# Patient Record
Sex: Female | Born: 1940 | Race: White | Hispanic: No | Marital: Married | Smoking: Never smoker
Health system: Southern US, Community
[De-identification: ages and names within clinical notes are randomized; demographics above are authoritative.]

## PROBLEM LIST (undated history)

## (undated) DIAGNOSIS — S83209A Unspecified tear of unspecified meniscus, current injury, unspecified knee, initial encounter: Secondary | ICD-10-CM

## (undated) DIAGNOSIS — M7500 Adhesive capsulitis of unspecified shoulder: Secondary | ICD-10-CM

## (undated) DIAGNOSIS — M5136 Other intervertebral disc degeneration, lumbar region: Secondary | ICD-10-CM

## (undated) DIAGNOSIS — I7090 Unspecified atherosclerosis: Secondary | ICD-10-CM

## (undated) DIAGNOSIS — M51369 Other intervertebral disc degeneration, lumbar region without mention of lumbar back pain or lower extremity pain: Secondary | ICD-10-CM

## (undated) DIAGNOSIS — M329 Systemic lupus erythematosus, unspecified: Secondary | ICD-10-CM

## (undated) DIAGNOSIS — M47812 Spondylosis without myelopathy or radiculopathy, cervical region: Secondary | ICD-10-CM

## (undated) DIAGNOSIS — R937 Abnormal findings on diagnostic imaging of other parts of musculoskeletal system: Secondary | ICD-10-CM

## (undated) DIAGNOSIS — IMO0002 Reserved for concepts with insufficient information to code with codable children: Secondary | ICD-10-CM

## (undated) DIAGNOSIS — K589 Irritable bowel syndrome without diarrhea: Secondary | ICD-10-CM

## (undated) DIAGNOSIS — M5090 Cervical disc disorder, unspecified, unspecified cervical region: Secondary | ICD-10-CM

## (undated) DIAGNOSIS — G0481 Other encephalitis and encephalomyelitis: Secondary | ICD-10-CM

## (undated) DIAGNOSIS — G259 Extrapyramidal and movement disorder, unspecified: Secondary | ICD-10-CM

## (undated) DIAGNOSIS — M1990 Unspecified osteoarthritis, unspecified site: Secondary | ICD-10-CM

## (undated) DIAGNOSIS — Z9889 Other specified postprocedural states: Secondary | ICD-10-CM

## (undated) DIAGNOSIS — I10 Essential (primary) hypertension: Secondary | ICD-10-CM

## (undated) DIAGNOSIS — M7710 Lateral epicondylitis, unspecified elbow: Secondary | ICD-10-CM

## (undated) DIAGNOSIS — L309 Dermatitis, unspecified: Secondary | ICD-10-CM

## (undated) DIAGNOSIS — R930 Abnormal findings on diagnostic imaging of skull and head, not elsewhere classified: Secondary | ICD-10-CM

## (undated) DIAGNOSIS — D8989 Other specified disorders involving the immune mechanism, not elsewhere classified: Secondary | ICD-10-CM

## (undated) DIAGNOSIS — E785 Hyperlipidemia, unspecified: Secondary | ICD-10-CM

## (undated) DIAGNOSIS — M47816 Spondylosis without myelopathy or radiculopathy, lumbar region: Secondary | ICD-10-CM

## (undated) DIAGNOSIS — I4891 Unspecified atrial fibrillation: Secondary | ICD-10-CM

## (undated) DIAGNOSIS — R112 Nausea with vomiting, unspecified: Secondary | ICD-10-CM

## (undated) DIAGNOSIS — K529 Noninfective gastroenteritis and colitis, unspecified: Secondary | ICD-10-CM

## (undated) HISTORY — DX: Hyperlipidemia, unspecified: E78.5

## (undated) HISTORY — DX: Spondylosis without myelopathy or radiculopathy, cervical region: M47.812

## (undated) HISTORY — DX: Other specified disorders involving the immune mechanism, not elsewhere classified: D89.89

## (undated) HISTORY — DX: Adhesive capsulitis of unspecified shoulder: M75.00

## (undated) HISTORY — DX: Other intervertebral disc degeneration, lumbar region: M51.36

## (undated) HISTORY — DX: Reserved for concepts with insufficient information to code with codable children: IMO0002

## (undated) HISTORY — DX: Unspecified atrial fibrillation: I48.91

## (undated) HISTORY — DX: Noninfective gastroenteritis and colitis, unspecified: K52.9

## (undated) HISTORY — DX: Unspecified tear of unspecified meniscus, current injury, unspecified knee, initial encounter: S83.209A

## (undated) HISTORY — DX: Cervical disc disorder, unspecified, unspecified cervical region: M50.90

## (undated) HISTORY — DX: Extrapyramidal and movement disorder, unspecified: G25.9

## (undated) HISTORY — PX: CATARACT EXTRACTION: SUR2

## (undated) HISTORY — DX: Unspecified osteoarthritis, unspecified site: M19.90

## (undated) HISTORY — DX: Essential (primary) hypertension: I10

## (undated) HISTORY — DX: Systemic lupus erythematosus, unspecified: M32.9

## (undated) HISTORY — DX: Lateral epicondylitis, unspecified elbow: M77.10

## (undated) HISTORY — PX: COLONOSCOPY: SHX174

## (undated) HISTORY — PX: HEMORRHOID BANDING: SHX5850

## (undated) HISTORY — DX: Unspecified atherosclerosis: I70.90

## (undated) HISTORY — DX: Dermatitis, unspecified: L30.9

## (undated) HISTORY — DX: Other intervertebral disc degeneration, lumbar region without mention of lumbar back pain or lower extremity pain: M51.369

## (undated) HISTORY — DX: Irritable bowel syndrome, unspecified: K58.9

## (undated) HISTORY — PX: EYE SURGERY: SHX253

## (undated) HISTORY — DX: Spondylosis without myelopathy or radiculopathy, lumbar region: M47.816

## (undated) HISTORY — PX: WISDOM TOOTH EXTRACTION: SHX21

---

## 1978-04-23 HISTORY — PX: TUBAL LIGATION: SHX77

## 1996-10-22 ENCOUNTER — Encounter: Payer: Self-pay | Admitting: Gastroenterology

## 2002-05-01 ENCOUNTER — Encounter: Payer: Self-pay | Admitting: Gastroenterology

## 2002-05-01 ENCOUNTER — Ambulatory Visit (HOSPITAL_COMMUNITY): Admission: RE | Admit: 2002-05-01 | Discharge: 2002-05-01 | Payer: Self-pay | Admitting: Gastroenterology

## 2008-07-06 ENCOUNTER — Other Ambulatory Visit: Admission: RE | Admit: 2008-07-06 | Discharge: 2008-07-06 | Payer: Self-pay | Admitting: Family Medicine

## 2010-09-08 NOTE — Op Note (Signed)
   [name], Lindsay Clark                           ACCOUNT NO.:  [id number]   MEDICAL RECORD NO.:  [id number]                   PATIENT TYPE:  AMB   LOCATION:  ENDO                                 FACILITY:  [facility]   PHYSICIAN:  Danise Edge, M.D.                DATE OF BIRTH:  1940/12/02   DATE OF PROCEDURE:  05/01/2002  DATE OF DISCHARGE:                                 OPERATIVE REPORT   PROCEDURE:  Screening colonoscopy.   INDICATIONS:  The patient is a 70 year old female born 1940/11/09. The  patient's mother was diagnosed with colon cancer at age 75. The patient is  undergoing a screening colonoscopy with polypectomy to prevent colon cancer.   ENDOSCOPIST:  Danise Edge, M.D.   PREMEDICATION:  1. Versed 5 mg.  2. Demerol 50 mg.   ENDOSCOPE:  Olympus pediatric colonoscope.   DESCRIPTION OF PROCEDURE:  After informed consent was obtained the patient  was placed in the left lateral decubitus position. I administered  intravenous Demerol and intravenous Versed to achieve conscious sedation for  the procedure. The patient's blood pressure, O2 saturation and cardiac  rhythm were monitored throughout the procedure and documented in the medical  record.   Anal inspection was normal. Digital rectal exam was normal. The Olympus  pediatric video colonoscope was introduced into the rectum and easily  advanced to the cecum. Colonic preparation for the examination today was  excellent.   Rectum:  Normal.  Sigmoid colon and descending colon:  Normal.  Splenic flexure:  Normal.  Transverse colon:  Normal.  Hepatic flexure:  Normal.  Ascending colon:  Normal.  Cecum and ileocecal valve:  Normal.    ASSESSMENT:  Normal screening proctocolonoscopy to the cecum. No endoscopic  evidence for the presence of colorectal neoplasia.   RECOMMENDATIONS:  Repeat colonoscopy in approximately 5 years.                                               Danise Edge, M.D.    MJ/MEDQ  D:   05/01/2002  T:  05/02/2002  Job:  [id number]   cc:   Lilla Shook, M.D.  [address], [address]  [place]  [place]  [address]  Fax: [phone]

## 2011-07-16 ENCOUNTER — Other Ambulatory Visit (HOSPITAL_COMMUNITY)
Admission: RE | Admit: 2011-07-16 | Discharge: 2011-07-16 | Disposition: A | Discharge status: Home / Self Care | Payer: Medicare Other | Source: Ambulatory Visit | Attending: Family Medicine | Admitting: Family Medicine

## 2011-07-16 DIAGNOSIS — Z124 Encounter for screening for malignant neoplasm of cervix: Secondary | ICD-10-CM | POA: Insufficient documentation

## 2011-11-30 DIAGNOSIS — L280 Lichen simplex chronicus: Secondary | ICD-10-CM | POA: Insufficient documentation

## 2011-11-30 DIAGNOSIS — L299 Pruritus, unspecified: Secondary | ICD-10-CM | POA: Insufficient documentation

## 2014-01-21 ENCOUNTER — Other Ambulatory Visit: Payer: Self-pay | Admitting: Family Medicine

## 2014-01-21 ENCOUNTER — Ambulatory Visit
Admission: RE | Admit: 2014-01-21 | Discharge: 2014-01-21 | Disposition: A | Discharge status: Home / Self Care | Payer: Medicare HMO | Source: Ambulatory Visit | Attending: Family Medicine | Admitting: Family Medicine

## 2014-01-21 DIAGNOSIS — R059 Cough, unspecified: Secondary | ICD-10-CM

## 2014-01-21 DIAGNOSIS — R05 Cough: Secondary | ICD-10-CM

## 2014-04-01 ENCOUNTER — Other Ambulatory Visit: Payer: Self-pay | Admitting: Family Medicine

## 2014-04-01 DIAGNOSIS — R4781 Slurred speech: Secondary | ICD-10-CM

## 2014-04-01 DIAGNOSIS — R29898 Other symptoms and signs involving the musculoskeletal system: Secondary | ICD-10-CM

## 2014-04-05 ENCOUNTER — Other Ambulatory Visit: Payer: Medicare HMO

## 2014-04-05 ENCOUNTER — Ambulatory Visit
Admission: RE | Admit: 2014-04-05 | Discharge: 2014-04-05 | Disposition: A | Discharge status: Home / Self Care | Payer: Medicare HMO | Source: Ambulatory Visit | Attending: Family Medicine | Admitting: Family Medicine

## 2014-04-05 DIAGNOSIS — R4781 Slurred speech: Secondary | ICD-10-CM

## 2014-04-05 DIAGNOSIS — R29898 Other symptoms and signs involving the musculoskeletal system: Secondary | ICD-10-CM

## 2014-04-05 MED ORDER — GADOBENATE DIMEGLUMINE 529 MG/ML IV SOLN
18.0000 mL | Freq: Once | INTRAVENOUS | Status: AC | PRN
  Administered 2014-04-05: 18 mL via INTRAVENOUS

## 2014-04-06 ENCOUNTER — Other Ambulatory Visit: Payer: Self-pay | Admitting: Family Medicine

## 2014-04-06 DIAGNOSIS — I63412 Cerebral infarction due to embolism of left middle cerebral artery: Secondary | ICD-10-CM

## 2014-04-08 ENCOUNTER — Ambulatory Visit: Payer: Medicare HMO | Admitting: Occupational Therapy

## 2014-04-08 ENCOUNTER — Ambulatory Visit: Payer: Medicare HMO | Attending: Family Medicine | Admitting: Physical Therapy

## 2014-04-08 ENCOUNTER — Encounter: Payer: Self-pay | Admitting: Occupational Therapy

## 2014-04-08 ENCOUNTER — Encounter: Payer: Self-pay | Admitting: Physical Therapy

## 2014-04-08 DIAGNOSIS — R269 Unspecified abnormalities of gait and mobility: Secondary | ICD-10-CM

## 2014-04-08 DIAGNOSIS — I69359 Hemiplegia and hemiparesis following cerebral infarction affecting unspecified side: Secondary | ICD-10-CM | POA: Diagnosis present

## 2014-04-08 DIAGNOSIS — I63512 Cerebral infarction due to unspecified occlusion or stenosis of left middle cerebral artery: Secondary | ICD-10-CM

## 2014-04-08 DIAGNOSIS — I639 Cerebral infarction, unspecified: Secondary | ICD-10-CM

## 2014-04-08 DIAGNOSIS — R531 Weakness: Secondary | ICD-10-CM

## 2014-04-08 NOTE — Therapy (Signed)
[facility] [address] [place], [place], [address] Phone: [phone]   Fax:  [phone]  Occupational Therapy Evaluation  Patient Details  Name: Lindsay Clark MRN: [id number] Date of Birth: [birth year]  Encounter Date: 04/08/2014      OT End of Session - 04/08/14 1711    Visit Number 1   Number of Visits 1   OT Start Time 1400   OT Stop Time 1440   OT Time Calculation (min) 40 min   Activity Tolerance Patient tolerated treatment well      History reviewed. No pertinent past medical history.  History reviewed. No pertinent past surgical history.  There were no vitals taken for this visit.  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 No/denies          [facility] OT Assessment - 04/08/14 0001    Assessment   Diagnosis L MCA CVA   Onset Date 02/14/14   Prior Therapy PT for back    Precautions   Precautions None   Restrictions   Weight Bearing Restrictions No   Balance Screen   Has the patient fallen in the past 6 months No   Home  Environment   Family/patient expects to be discharged to: Private residence   Home Layout One level   [place]  built in Civil engineer, contracting   Lives With Spouse   Prior Function   Level of Independence Independent with basic ADLs;Independent with homemaking with ambulation   ADL   Eating/Feeding Independent   Grooming Independent   Upper Body Bathing Independent   Lower Body Bathing Modified independent  occasionally sits to get between her toes   Upper Body Dressing Independent   Lower Body Dressing Modified independent  pt states  she has trouble/numbness in 2 toes/increased time.   Research officer, political party   Where Assessed - Arts administrator Modified independent  grab bar and occassionally sits   IADL   Shopping Takes care of all shopping needs independently   [place] house alone or with occasional assistance  Needs assist with heavy fall cleaning   Meal Prep Plans, prepares and serves adequate meals independently   [place] own vehicle   Medication Management Is responsible for taking medication in correct dosages at correct time   Physiological scientist financial matters independently (budgets, writes checks, pays rent, bills goes to bank), collects and keeps track of income   Mobility   Mobility Status Independent   Mobility Status Comments no falls and no use of any equipment   Written Expression   Dominant Hand Right   Activity Tolerance   Activity Tolerance Comments pt functions independent and is active in community however states she feels her energy level is not the same.   Cognition   Overall Cognitive Status Within Functional Limits for tasks assessed  pt feels she is not as organized; could not give Haematologist  no obvious deficits in eval/pt unable to give example   Observation/Other Assessments   Outcome Measures Hand Function 80%  Sensation   Light Touch Appears Intact   Hot/Cold Appears Intact  per pt report with shower water   Proprioception Appears Intact   Coordination   Gross Motor Movements are Fluid and Coordinated Yes   Fine Motor Movements are Fluid and Coordinated Yes   Coordination 9 hole peg R=20.9 and L = 22.9   AROM   Overall AROM  Within functional limits for tasks performed   Strength   Overall Strength Within functional limits for tasks performed   Hand Function   Comments R grasp = 45 L grasp = 45                         OT  Short Term Goals - 04/08/14 1716    OT SHORT TERM GOAL #1   Title N/A           OT Long Term Goals - 04/08/14 1716    OT LONG TERM GOAL #1   Title N/A               Plan - 04/08/14 1711    Clinical Impression Statement Pt is a 73 year old female s/p L CVA on 02/14/2014 who recently saw her MD with numerous complaints of related to completing functional tasks as well as numbness in her RUE and RLE.  Upon evaluation, pt tested within normal limits for all areas on OT eval and no specific impairments related to OT scope of practice.  Pt does report some difficulty with LB dressing and IADL's but these are related to LE numbnes, debility and balance.  Pt to be seen by PT today for eval. Please see PT eval.   Clinical Impairments Affecting Rehab Potential Pt does not require skilled OT services as this time.   OT Frequency --  N/A   OT Duration --  N/A   Plan Pt does not require skilled OT services at this time. Pt aware and in agreement.   Consulted and Agree with Plan of Care Patient        Problem List There are no active problems to display for this patient.  Forde Radon, MS, OTR/L 04/08/2014 5:20 PM Phone: [phone] Fax: [phone]   Lindsay Clark 04/08/2014, 5:20 PM  [facility] [address] [place], [place], [address] Phone: [phone]   Fax:  [phone]

## 2014-04-08 NOTE — Therapy (Signed)
[facility] [address] [place], [place], [address] Phone: [phone]   Fax:  [phone]  Physical Therapy Evaluation  Patient Details  Name: Lindsay Clark MRN: [id number] Date of Birth: 02/25/1941  Encounter Date: 04/08/2014      PT End of Session - 04/08/14 1445    Visit Number 1   Number of Visits 17   Date for PT Re-Evaluation 06/04/13   PT Start Time 1497   PT Stop Time 1530   PT Time Calculation (min) 45 min   Equipment Utilized During Treatment Gait belt   Activity Tolerance Patient tolerated treatment well   Behavior During Therapy [facility] for tasks assessed/performed      History reviewed. No pertinent past medical history.  History reviewed. No pertinent past surgical history.  There were no vitals taken for this visit.  Visit Diagnosis:  CVA (cerebral vascular accident) - Plan: PT plan of care cert/re-cert  Abnormality of gait - Plan: PT plan of care cert/re-cert  Weakness generalized - Plan: PT plan of care cert/re-cert      Subjective Assessment - 04/08/14 1449    Symptoms This 73yo female was diagnosed with CVA in Dec with imaging indicating infarct in Oct. She was not hospitalized.   Currently in Pain? No/denies          [facility] PT Assessment - 04/08/14 1445    Assessment   Medical Diagnosis CVA   Precautions   Precautions Fall   Restrictions   Weight Bearing Restrictions No   Balance Screen   Has the patient fallen in the past 6 months No   Has the patient had a decrease in activity level because of a fear of falling?  No   Is the patient reluctant to leave their home because of a fear of falling?  No   Home Environment   Living Enviornment Private residence   Living Arrangements Spouse/significant other   Type of [place] to enter   Entrance Stairs-Number of Steps 2   [place] One level   Prior Function   Level of  Independence Independent with basic ADLs;Independent with homemaking with ambulation;Independent with gait;Independent with transfers   Observation/Other Assessments   Focus on Therapeutic Outcomes (FOTO)  53  Functional Status   Stroke Impact Scale  83.3%   Fear Avoidance Belief Questionnaire (FABQ)  19   Strength   Overall Strength Deficits  right hip extension & abduction 4/5, gastroc 2/5   Ambulation/Gait   Ambulation/Gait Yes   Ambulation/Gait Assistance 5: Supervision   Ambulation Distance (Feet) 300 Feet   Assistive device None   Gait Pattern Decreased stride length;Decreased stance time - left;Poor foot clearance - right  initial contact with lateral foot & foot flat   Gait velocity 4.39 ft/sec   Stairs Yes   Stairs Assistance 5: Supervision   Stair Management Technique No rails;Alternating pattern;Forwards   Number of Stairs 4   Berg Balance Test   Sit to Stand Able to stand without using hands and stabilize independently   Standing Unsupported Able to stand safely 2 minutes   Sitting with Back Unsupported but Feet Supported on Floor or Stool Able to sit safely and securely 2 minutes   Stand to Sit Sits safely with minimal use of hands   Transfers Able to transfer safely, minor use of hands   Standing Unsupported with Eyes Closed Able to stand 10 seconds safely  Standing Ubsupported with Feet Together Able to place feet together independently and stand 1 minute safely   From Standing, Reach Forward with Outstretched Arm Can reach confidently >25 cm (10")   From Standing Position, Pick up Object from [place] to pick up shoe safely and easily   From Standing Position, Turn to Look Behind Over each Shoulder Looks behind from both sides and weight shifts well   Turn 360 Degrees Able to turn 360 degrees safely one side only in 4 seconds or less   Standing Unsupported, Alternately Place Feet on Step/Stool Able to stand independently and safely and complete 8 steps in 20  seconds   Standing Unsupported, One Foot in Front Able to take small step independently and hold 30 seconds   Standing on One Leg Tries to lift leg/unable to hold 3 seconds but remains standing independently   Total Score 50   Dynamic Gait Index   Level Surface Mild Impairment   Change in Gait Speed Mild Impairment   Gait with Horizontal Head Turns Mild Impairment   Gait with Vertical Head Turns Mild Impairment   Gait and Pivot Turn Mild Impairment   Step Over Obstacle Moderate Impairment   Step Around Obstacles Mild Impairment   Steps Mild Impairment   Total Score 15   Timed Up and Go Test   Normal TUG (seconds) 9.18                          PT Education - 04/08/14 1445    Education provided Yes   Education Details exercise need   Person(s) Educated Patient   Methods Explanation   Comprehension Verbalized understanding          PT Short Term Goals - 04/08/14 1847    PT SHORT TERM GOAL #1   Title demonstrates initial HEP correctly. (Target Date: 05/07/14)   Time 4   Period Weeks   Status New   PT SHORT TERM GOAL #2   Title ambulates 500' without device scanning environment with balance loss. (Target Date: 05/07/14)   Time 4   Period Weeks   Status New   PT SHORT TERM GOAL #3   Title Berg Balance >52/56 (Target Date: 05/07/14)   Time 4   Period Weeks   Status New           PT Long Term Goals - 04/08/14 1849    PT LONG TERM GOAL #1   Title verbalizes CVA risk factors & signs/symptoms (Target Date: 06/04/14)   Time 8   Period Weeks   Status New   PT LONG TERM GOAL #2   Title verbalizes / demonstrates HEP / ongoing fitness plan correctly. (Target Date: 05/07/14)   Time 8   Period Weeks   Status New   PT LONG TERM GOAL #3   Title Dynamic Gait Index >/= 19/24 (Target Date: 05/07/14)   Time 8   Period Weeks   Status New   PT LONG TERM GOAL #4   Title ambulates 500' including grass, ramp, curb without device independently. (Target Date:  05/07/14)   Time 8   Period Weeks   Status New               Plan - 04/08/14 1843    Clinical Impression Statement This 73yo female had CVA in October. She noticed deficits but did not go to doctor for diagnosis until December. A MRI on 04/05/14 revealed left MCA CVA. She  presented to [facility] for OT & PT evaluations. Her Berg Balance test indicates ~50% risk of falls, Dynamic Gait Index indicates high risk of falls and she has weakness with medical issues with lack of knowledge how to safely progress.   Pt will benefit from skilled therapeutic intervention in order to improve on the following deficits Abnormal gait;Decreased activity tolerance;Decreased balance;Decreased coordination;Decreased range of motion;Decreased safety awareness;Decreased mobility;Decreased strength   Rehab Potential Good   PT Frequency 2x / week   PT Duration 8 weeks   PT Treatment/Interventions ADLs/Self Care Home Management;Gait training;Stair training;Functional mobility training;Therapeutic activities;Therapeutic exercise;Balance training;Neuromuscular re-education;Patient/family education   PT Next Visit Plan HEP for balance, Sensory Organization Test   PT Home Exercise Plan Balance & strength HEP   Consulted and Agree with Plan of Care Patient          G-Codes - 2014-05-01 1852    Functional Assessment Tool Used Dynamic Gait Index 15/24   Functional Limitation Mobility: Walking and moving around   Mobility: Walking and Moving Around Current Status [phone]) At least 40 percent but less than 60 percent impaired, limited or restricted   Mobility: Walking and Moving Around Goal Status [phone]) At least 1 percent but less than 20 percent impaired, limited or restricted       Problem List There are no active problems to display for this patient.   Jamey Reas PT, DPT 05/01/14, 6:55 PM  [facility] [address] [place], [place],  [address] Phone: [phone]   Fax:  [phone]

## 2014-04-09 ENCOUNTER — Other Ambulatory Visit (HOSPITAL_COMMUNITY): Payer: Self-pay | Admitting: *Deleted

## 2014-04-09 DIAGNOSIS — I639 Cerebral infarction, unspecified: Secondary | ICD-10-CM

## 2014-04-13 ENCOUNTER — Ambulatory Visit
Admission: RE | Admit: 2014-04-13 | Discharge: 2014-04-13 | Disposition: A | Discharge status: Home / Self Care | Payer: Medicare HMO | Source: Ambulatory Visit | Attending: Family Medicine | Admitting: Family Medicine

## 2014-04-13 ENCOUNTER — Ambulatory Visit (HOSPITAL_BASED_OUTPATIENT_CLINIC_OR_DEPARTMENT_OTHER): Payer: Medicare HMO | Admitting: Radiology

## 2014-04-13 ENCOUNTER — Ambulatory Visit (HOSPITAL_COMMUNITY): Payer: Medicare HMO | Attending: Cardiology | Admitting: *Deleted

## 2014-04-13 ENCOUNTER — Other Ambulatory Visit (HOSPITAL_COMMUNITY): Payer: Self-pay | Admitting: Family Medicine

## 2014-04-13 DIAGNOSIS — I63412 Cerebral infarction due to embolism of left middle cerebral artery: Secondary | ICD-10-CM

## 2014-04-13 DIAGNOSIS — I63442 Cerebral infarction due to embolism of left cerebellar artery: Secondary | ICD-10-CM

## 2014-04-13 DIAGNOSIS — I639 Cerebral infarction, unspecified: Secondary | ICD-10-CM

## 2014-04-13 NOTE — Progress Notes (Signed)
Echocardiogram performed.  

## 2014-04-13 NOTE — Progress Notes (Signed)
Carotid Duplex Exam Performed 

## 2014-04-15 ENCOUNTER — Encounter: Payer: Self-pay | Admitting: Physical Therapy

## 2014-04-15 ENCOUNTER — Ambulatory Visit: Payer: Medicare HMO | Admitting: Physical Therapy

## 2014-04-15 DIAGNOSIS — I69359 Hemiplegia and hemiparesis following cerebral infarction affecting unspecified side: Secondary | ICD-10-CM | POA: Diagnosis not present

## 2014-04-15 DIAGNOSIS — R269 Unspecified abnormalities of gait and mobility: Secondary | ICD-10-CM

## 2014-04-15 DIAGNOSIS — I63512 Cerebral infarction due to unspecified occlusion or stenosis of left middle cerebral artery: Secondary | ICD-10-CM

## 2014-04-15 DIAGNOSIS — R531 Weakness: Secondary | ICD-10-CM

## 2014-04-15 NOTE — Patient Instructions (Signed)
Feet Apart (Compliant Surface) Head Motion - Eyes Open   With eyes open, standing on compliant surface: ___pillow or foam_____, feet shoulder width apart, move head slowly: up and down, side to side and diagnoals. Repeat _10-15 each direction___ times per session. Do ___1-2_ sessions per day.  Copyright  VHI. All rights reserved.  Feet Apart (Compliant Surface) Head Motion - Eyes Closed   Stand on compliant surface: ____pillow or foam____ with feet shoulder width apart. Close eyes and move head slowly, up and down, side to side & 2 diagonals. Repeat _10-15 each direction___ times per session. Do _1-2___ sessions per day.  Copyright  VHI. All rights reserved.

## 2014-04-15 NOTE — Therapy (Signed)
[facility] [address] [place], [place], [address] Phone: [phone]   Fax:  [phone]  Physical Therapy Treatment  Patient Details  Name: Lindsay Clark MRN: [id number] Date of Birth: 1940-12-10  Encounter Date: 04/15/2014      PT End of Session - 04/15/14 0800    Visit Number 2   Number of Visits 17   Date for PT Re-Evaluation 06/04/13   Authorization Type orthonet    Authorization Time Period 12/17-1/31/16 [id number]   Authorization - Visit Number 2   Authorization - Number of Visits 7   PT Start Time 0800   PT Stop Time 0900   PT Time Calculation (min) 60 min   Equipment Utilized During Treatment Gait belt   Activity Tolerance Patient tolerated treatment well   Behavior During Therapy [facility] for tasks assessed/performed      History reviewed. No pertinent past medical history.  History reviewed. No pertinent past surgical history.  There were no vitals taken for this visit.  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 OT reviewed that no measurable deficits were found at evaluation.   Currently in Pain? No/denies          [facility] PT Assessment - 04/15/14 0800    Dynamic Standing Balance   Dynamic Standing - Balance Support No upper extremity supported  In corner for safety   Dynamic Standing - Level of Assistance 5: Stand by assistance  PT instructing activity as HEP   Dynamic Standing - Balance Activities Eyes opn;Eyes closed;Compliant surface;Head nods;Head turns  head movement 4 directions   Dynamic Standing - Comments Balance issues >eyes closed on compliant   Balance Master Testing    Results Sensory Organization Test Composite Score 65  normal for  73yo sensory analysis, wt distributed: heel& Left                  [facility] Adult PT Treatment/Exercise - 04/15/14 0800    Ambulation/Gait   Ambulation/Gait --                PT Education - 04/15/14 0800    Education provided Yes   Education Details Reviewed SOT with explanation of results, fitness program overview with key components, HEP (see AVS)   Person(s) Educated Patient   Methods Explanation;Demonstration;Handout;Verbal cues   Comprehension Verbalized understanding;Returned demonstration;Need further instruction          PT Short Term Goals - 04/08/14 1847    PT SHORT TERM GOAL #1   Title demonstrates initial HEP correctly. (Target Date: 05/07/14)   Time 4   Period Weeks   Status New   PT SHORT TERM GOAL #2   Title ambulates 500' without device scanning environment with balance loss. (Target Date: 05/07/14)   Time 4   Period Weeks   Status New   PT SHORT TERM GOAL #3   Title Berg Balance >52/56 (Target Date: 05/07/14)   Time 4   Period Weeks   Status New           PT Long Term Goals - 04/08/14 1849    PT LONG TERM GOAL #1   Title verbalizes CVA risk factors & signs/symptoms (Target Date: 06/04/14)   Time 8  Period Weeks   Status New   PT LONG TERM GOAL #2   Title verbalizes / demonstrates HEP / ongoing fitness plan correctly. (Target Date: 05/07/14)   Time 8   Period Weeks   Status New   PT LONG TERM GOAL #3   Title Dynamic Gait Index >/= 19/24 (Target Date: 05/07/14)   Time 8   Period Weeks   Status New   PT LONG TERM GOAL #4   Title ambulates 500' including grass, ramp, curb without device independently. (Target Date: 05/07/14)   Time 8   Period Weeks   Status New               Plan - 04/15/14 0800    Clinical Impression Statement Sensory Organization Test indicates normal levels for average 73yo. However this patient presents as "young 73yo", so PT would have expected higher numbers. She appears to understand initial HEP    Pt will benefit from skilled therapeutic intervention in order to improve on the following deficits Abnormal gait;Decreased activity tolerance;Decreased balance;Decreased coordination;Decreased range of motion;Decreased safety awareness;Decreased mobility;Decreased strength   Rehab Potential Good   PT Frequency 2x / week   PT Duration 8 weeks   PT Treatment/Interventions ADLs/Self Care Home Management;Gait training;Stair training;Functional mobility training;Therapeutic activities;Therapeutic exercise;Balance training;Neuromuscular re-education;Patient/family education   PT Next Visit Plan HEP for balance / strength (theraband kicks & resisted UE movements), review exercises in corner   PT Home Exercise Plan --   Consulted and Agree with Plan of Care Patient        Problem List There are no active problems to display for this patient.   Jamey Reas PT, DPT 04/15/2014, 9:23 AM  [facility] [address] [place], [place], [address] Phone: [phone]   Fax:  [phone]

## 2014-04-19 ENCOUNTER — Ambulatory Visit (INDEPENDENT_AMBULATORY_CARE_PROVIDER_SITE_OTHER): Payer: Medicare HMO | Admitting: Diagnostic Neuroimaging

## 2014-04-19 ENCOUNTER — Encounter (HOSPITAL_COMMUNITY): Payer: Medicare HMO

## 2014-04-19 ENCOUNTER — Other Ambulatory Visit (HOSPITAL_COMMUNITY): Payer: Medicare HMO

## 2014-04-19 ENCOUNTER — Encounter: Payer: Self-pay | Admitting: Diagnostic Neuroimaging

## 2014-04-19 VITALS — BP 127/70 | HR 76 | Temp 97.3°F | Ht 65.5 in | Wt 197.6 lb

## 2014-04-19 DIAGNOSIS — I63312 Cerebral infarction due to thrombosis of left middle cerebral artery: Secondary | ICD-10-CM

## 2014-04-19 NOTE — Patient Instructions (Signed)
Caution with balance issues.  Take aspirin 81mg  daily.  Use tylenol as needed for headaches.

## 2014-04-19 NOTE — Progress Notes (Signed)
[facility]  PATIENT: Lindsay Clark DOB: Mar 18, 1941  REFERRING CLINICIAN: White  HISTORY FROM: patient  REASON FOR VISIT: new consult    HISTORICAL  CHIEF COMPLAINT:  Chief Complaint  Patient presents with  . Cerebrovascular Accident    HISTORY OF PRESENT ILLNESS:   73 year old right-handed female with hypertension, chronic dermatitis, here for evaluation of stroke.  April 2015 patient was having right hip pain, radiating to the right leg. Symptoms progressively worsened. November 2015 patient had progressive decreased energy, decreased right leg strength, difficulty performing household chores and cleaning. By Thanksgiving 2015 patient was having difficulty with her organizational skills and difficulty preparing Thanksgiving dinner. On 03/23/2014 patient had difficulty playing organ at church. Patient also noted involuntary movements of her right foot during this time. Patient having increasing pressure in her head. Patient was about a by PCP who ordered MRI brain there was treated in left lenticular nuclei subacute ischemic infarction. Patient had MRI brain, MRA head, carotid ultrasound, echocardiogram which I have reviewed. Patient has been validated by physical and occupational therapy as well. Patient now on aspirin 81 mg daily.   REVIEW OF SYSTEMS: Full 14 system review of systems performed and notable only for chills fatigue swelling in feet itching snoring increased thirst right leg movements numbness headache weakness change in appetite.    ALLERGIES: Allergies  Allergen Reactions  . Amlodipine     HOME MEDICATIONS: Outpatient Prescriptions Prior to Visit  Medication Sig Dispense Refill  . clobetasol cream (TEMOVATE) 8.34 % Apply 1 application topically 2 (two) times daily.    Marland Kitchen gabapentin (NEURONTIN) 300 MG capsule Take 300 mg by mouth daily.     . hydrochlorothiazide (HYDRODIURIL) 25 MG tablet Take 25 mg by mouth daily.    . ramipril (ALTACE)  10 MG capsule Take 20 mg by mouth daily.     . tacrolimus (PROTOPIC) 0.1 % ointment Apply topically 2 (two) times daily.     No facility-administered medications prior to visit.    PAST MEDICAL HISTORY: Past Medical History  Diagnosis Date  . Stroke   . Chronic dermatitis   . Hypertension     PAST SURGICAL HISTORY: Past Surgical History  Procedure Laterality Date  . Tubal ligation  1980    FAMILY HISTORY: Family History  Problem Relation Age of Onset  . Stroke Mother   . Colon cancer Mother   . Heart attack Father   . Emphysema Father     SOCIAL HISTORY:  History   Social History  . Marital Status: Married    Spouse Name: [name]    Number of Children: 2  . Years of Education: MAx2   Occupational History  . Retired Other   Social History Main Topics  . Smoking status: Never Smoker   . Smokeless tobacco: Never Used  . Alcohol Use: 0.0 oz/week    0 Not specified per week     Comment: 4 glasses of wine weekly  . Drug Use: No  . Sexual Activity: Not on file   Other Topics Concern  . Not on file   Social History Narrative   Patient lives at home with her spouse.   Caffeine Use: 2-3 cups daily     PHYSICAL EXAM  Filed Vitals:   04/19/14 0939  BP: 127/70  Pulse: 76  Temp: 97.3 F (36.3 C)  TempSrc: Oral  Height: 5' 5.5" (1.664 m)  Weight: 197 lb 9.6 oz (89.631 kg)    Body mass index is  32.37 kg/(m^2).   Visual Acuity Screening   Right eye Left eye Both eyes  Without correction:     With correction: 20/50 20/40     No flowsheet data found.  GENERAL EXAM: Patient is in no distress; well developed, nourished and groomed; neck is supple; DETAIL ORIENTED, LOQUACIOUS, BRIEF TEARFUL MOMENT.  CARDIOVASCULAR: Regular rate and rhythm, no murmurs, no carotid bruits  NEUROLOGIC: MENTAL STATUS: awake, alert, oriented to person, place and time, recent and remote memory intact, normal attention and concentration, language fluent, comprehension intact,  naming intact, fund of knowledge appropriate CRANIAL NERVE: no papilledema on fundoscopic exam, pupils equal and reactive to light, visual fields full to confrontation, extraocular muscles intact, no nystagmus, facial sensation symmetric, DECR RIGHT NL FOLD. Hearing intact, palate elevates symmetrically, uvula midline, shoulder shrug symmetric, tongue midline. MOTOR: normal bulk and tone, full strength in the BUE, BLE; SLIGHT DECREASED FINGER TAPPING ON RIGHT SIDE; CHOREIFORM MOVEMENTS IN RIGHT FOOT SENSORY: normal and symmetric to light touch, temperature, vibration  COORDINATION: finger-nose-finger, fine finger movements normal REFLEXES: deep tendon reflexes present and symmetric GAIT/STATION: narrow based gait; able to walk on toes, heels; CANNOT TANDEM; romberg is negative    DIAGNOSTIC DATA (LABS, IMAGING, TESTING) - I reviewed patient records, labs, notes, testing and imaging myself where available.  No results found for: WBC, HGB, HCT, MCV, PLT No results found for: NA, K, CL, CO2, GLUCOSE, BUN, CREATININE, CALCIUM, PROT, ALBUMIN, AST, ALT, ALKPHOS, BILITOT, GFRNONAA, GFRAA No results found for: CHOL, HDL, LDLCALC, LDLDIRECT, TRIG, CHOLHDL No results found for: HGBA1C No results found for: VITAMINB12 No results found for: TSH  I reviewed images myself and agree with interpretation. -VRP  04/05/14 MRI brain - A late subacute LEFT MCA territory infarct. The appearance of this infarct would be consistent with a time course of an event beginning in October. Moderate chronic microvascular ischemic change throughout the white matter without intracranial hemorrhage. The white matter lesions are unlikely to represent a demyelinating process.   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  04/14/14 MRA HEAD 1. No evidence of major intracranial arterial occlusion or significant proximal stenosis. Mild branch  vessel irregularity suggestive of atherosclerosis. 2. 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  04/13/14 TTE - Normal LV function; grade 1 diastolic dysfunction; trace MR.   04/14/14 CAROTID U/S 1. Intimal thickening throughout both CCA's. 2. 1-39% bilateral ICA stenosis.  3. Patent vertebral arteries with antegrade flow. 4. Normal subclavian arteries, bilaterally.     ASSESSMENT AND PLAN  73 y.o. 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 Stroke workup completed. The only unusual feature is subacute/gradual onset of symptoms. Will follow patient over time. We may need to repeat MRI of the brain in future if symptoms change, to exclude inflammatory or neoplastic process.  PLAN: - continue aspirin 81mg  daily - lipid panel per PCP; start statin if LDL > 100 - BP control - gradually increase activity level  Return in about 3 months (around 07/19/2014).    Penni Bombard, MD 43/15/4008, 67:61 AM Certified in Neurology, Neurophysiology and Neuroimaging  [facility] [address] [place], [place] [address] [phone]

## 2014-04-21 ENCOUNTER — Ambulatory Visit: Payer: Medicare HMO | Admitting: Diagnostic Neuroimaging

## 2014-04-21 ENCOUNTER — Ambulatory Visit: Payer: Medicare HMO

## 2014-04-21 DIAGNOSIS — I69359 Hemiplegia and hemiparesis following cerebral infarction affecting unspecified side: Secondary | ICD-10-CM | POA: Diagnosis not present

## 2014-04-21 DIAGNOSIS — R531 Weakness: Secondary | ICD-10-CM

## 2014-04-21 DIAGNOSIS — I639 Cerebral infarction, unspecified: Secondary | ICD-10-CM

## 2014-04-21 DIAGNOSIS — R269 Unspecified abnormalities of gait and mobility: Secondary | ICD-10-CM

## 2014-04-21 NOTE — Therapy (Signed)
[facility] [address] [place], [place], [address] Phone: [phone]   Fax:  [phone]  Physical Therapy Treatment  Patient Details  Name: Lindsay Clark MRN: [id number] Date of Birth: 1940-08-12  Encounter Date: 04/21/2014      PT End of Session - 04/21/14 1632    Visit Number 3   Number of Visits 17   Date for PT Re-Evaluation 06/04/13   Authorization Type orthonet    Authorization Time Period 12/17-1/31/16 AUTH#[id number]   Authorization - Visit Number 3   Authorization - Number of Visits 7   PT Start Time 4008   PT Stop Time 1620   PT Time Calculation (min) 45 min      Past Medical History  Diagnosis Date  . Stroke   . Chronic dermatitis   . Hypertension     Past Surgical History  Procedure Laterality Date  . Tubal ligation  1980    There were no vitals taken for this visit.  Visit Diagnosis:  Abnormality of gait  Weakness generalized  CVA (cerebral vascular accident)      Subjective Assessment - 04/21/14 1538    Symptoms Pt states "I am not asking to be back to how I was at 73 years old. I want to be where I was at 43. I don't need to be able to keep my balance with my eyes closed." She reports concern over OT not picking her up as she can't open jars or play piano. OT reviewed that no measurable deficits were found at evaluation. Pt reports that Dr. Leta Baptist was going to be sending a new OT order over again. It is not curently in the EPIC cart.   Currently in Pain? No/denies       Self care 15 minutes: Pt thoroughly expressed her frustration with inability to perform ADL and dressing tasks using the RUE and her feeling that she needs occupational therapy for this despite OT not recommending therapy. Also frustrated with exercises previously provided. After explanation of the purpose of these exercises and how they will contribute to pt's personal goal of improving her ambulation  ability as well as a gait assessment with explanation of impairments and additional exercises to address gait deviations she became more willing to perform all exercises and appeared to be set at greater ease.   Theract: Gait assessment [phone]' with close supervision and no assistive devic see clinical impression statement for details. Taught and performed lateral and anterior/posterior pelvic tilts with mirror for visual cues and with tactile facilitation. Practiced posture correction in sitting and standing. Then gait assessment afterward x230' with distant supervision and significant improvement in pelvic and trunk position as well as balance and coordination. Pt noted to have good heel strike and pre swing, wide base of support, outtoeing and pronation.                      PT Education - 04/21/14 1631    Education provided Yes   Education Details HEP-pelvic mobility/core strength/posture and explanation of purpose for each of today's exercises as well as the HEP from previous session.   Person(s) Educated Patient   Methods Explanation;Demonstration;Tactile cues;Handout;Verbal cues   Comprehension Verbalized understanding;Returned demonstration          PT Short Term Goals - 04/08/14 1847    PT SHORT TERM GOAL #1   Title demonstrates initial HEP correctly. (Target Date: 05/07/14)   Time 4   Period Weeks  Status New   PT SHORT TERM GOAL #2   Title ambulates 500' without device scanning environment with balance loss. (Target Date: 05/07/14)   Time 4   Period Weeks   Status New   PT SHORT TERM GOAL #3   Title Berg Balance >52/56 (Target Date: 05/07/14)   Time 4   Period Weeks   Status New           PT Long Term Goals - 04/08/14 1849    PT LONG TERM GOAL #1   Title verbalizes CVA risk factors & signs/symptoms (Target Date: 06/04/14)   Time 8   Period Weeks   Status New   PT LONG TERM GOAL #2   Title verbalizes / demonstrates HEP / ongoing fitness plan  correctly. (Target Date: 05/07/14)   Time 8   Period Weeks   Status New   PT LONG TERM GOAL #3   Title Dynamic Gait Index >/= 19/24 (Target Date: 05/07/14)   Time 8   Period Weeks   Status New   PT LONG TERM GOAL #4   Title ambulates 500' including grass, ramp, curb without device independently. (Target Date: 05/07/14)   Time 8   Period Weeks   Status New               Plan - 04/21/14 1633    Clinical Impression Statement Pt demonstrates left lateral trunk lean and R pelvic drop with ambulation causing increased unsteadiness with left turns, and difficulty coordinating right turns. With posture training, core activation and facilitation, pt able to correct this while ambulating at end of session. No fixed scoliosis noted, posture is flexible.   PT Next Visit Plan HEP for balance / strength (theraband kicks & resisted UE movements), review exercises in corner   PT Home Exercise Plan Balance & strength HEP        Problem List There are no active problems to display for this patient.   Delrae Sawyers D 04/21/2014, 4:44 PM  [facility] [address] [place], [place], [address] Phone: [phone]   Fax:  [phone]

## 2014-04-21 NOTE — Patient Instructions (Signed)
PELVIC TILT: Lateral   Sit on the edge of a chair with your feet behind your knees, chest out. Shift weight to right side by pressing the left foot into the ground and raising left hip from surface (left hip and shoulder will "crunch"). Then perform on the other side. Continue alternating in this manner. 10 reps per set, 3 sets per day, 5 days per week   *remember, if you can't get the right hip to hike, try reaching overhead with the left hand.  Copyright  VHI. All rights reserved.  PELVIC TILT: Anterior   Start on edge of chair in slumped position. Roll pelvis forward to arch back with chest out, then backward again.  10 reps per set, 3 sets per day, 5 days per week   Copyright  VHI. All rights reserved.   Lastly, Stand in front of a mirror making sure weight is equal on both feet (both knees straight), and hips and shoulders are straight. Hold this position for 1-2 minutes. Check your standing posture a few times daily.

## 2014-04-26 ENCOUNTER — Encounter: Payer: Self-pay | Admitting: Physical Therapy

## 2014-04-26 ENCOUNTER — Ambulatory Visit: Payer: Medicare HMO | Attending: Family Medicine | Admitting: Physical Therapy

## 2014-04-26 DIAGNOSIS — R531 Weakness: Secondary | ICD-10-CM

## 2014-04-26 DIAGNOSIS — I69359 Hemiplegia and hemiparesis following cerebral infarction affecting unspecified side: Secondary | ICD-10-CM | POA: Insufficient documentation

## 2014-04-26 DIAGNOSIS — R269 Unspecified abnormalities of gait and mobility: Secondary | ICD-10-CM | POA: Diagnosis not present

## 2014-04-26 DIAGNOSIS — I639 Cerebral infarction, unspecified: Secondary | ICD-10-CM

## 2014-04-26 NOTE — Therapy (Signed)
[facility] [address] [place], [place], [address] Phone: [phone]   Fax:  [phone]  Physical Therapy Treatment  Patient Details  Name: Lindsay Clark MRN: [id number] Date of Birth: [birth year]  Encounter Date: 04/26/2014      PT End of Session - 04/26/14 1400    Visit Number 4   Number of Visits 17   Date for PT Re-Evaluation 06/04/13   Authorization Type orthonet    Authorization Time Period 12/17-1/31/16 AUTH#[id number]   Authorization - Visit Number 4   Authorization - Number of Visits 7   PT Start Time 1410   PT Stop Time 1503   PT Time Calculation (min) 53 min   Equipment Utilized During Treatment Gait belt   Activity Tolerance Patient tolerated treatment well   Behavior During Therapy [facility] for tasks assessed/performed      Past Medical History  Diagnosis Date  . Stroke   . Chronic dermatitis   . Hypertension     Past Surgical History  Procedure Laterality Date  . Tubal ligation  1980    There were no vitals taken for this visit.  Visit Diagnosis:  Abnormality of gait  Weakness generalized  CVA (cerebral vascular accident)      Subjective Assessment - 04/26/14 1414    Symptoms She has been doing her exercises.    Currently in Pain? No/denies         Therapeutic Exercise: PT instructed with demo exercises /activites for  Hand: marbles, ball squeeze, finger extension with rubber bands, using hands in kitchen like meatloaf, flipping /holding cards,   Feet/legs: A-Z trace with toe /ankle movements, picking up marbles & placing in cup to lateral side of foot, foot movements in sand, targets on wall, placing foot in circle /target.  Trunk: lateral weight shift & anterior/posterior tilts. (reviewed last weeks HEP) Self-care: worked on placing foot in pant leg (seated).                   PT Education - 04/26/14 1528    Education provided Yes   Education Details  exercises /activities with hands & legs   Person(s) Educated Patient   Methods Explanation;Demonstration   Comprehension Verbalized understanding          PT Short Term Goals - 04/08/14 1847    PT SHORT TERM GOAL #1   Title demonstrates initial HEP correctly. (Target Date: 05/07/14)   Time 4   Period Weeks   Status New   PT SHORT TERM GOAL #2   Title ambulates 500' without device scanning environment with balance loss. (Target Date: 05/07/14)   Time 4   Period Weeks   Status New   PT SHORT TERM GOAL #3   Title Berg Balance >52/56 (Target Date: 05/07/14)   Time 4   Period Weeks   Status New           PT Long Term Goals - 04/08/14 1849    PT LONG TERM GOAL #1   Title verbalizes CVA risk factors & signs/symptoms (Target Date: 06/04/14)   Time 8   Period Weeks   Status New   PT LONG TERM GOAL #2   Title verbalizes / demonstrates HEP / ongoing fitness plan correctly. (Target Date: 05/07/14)   Time 8   Period Weeks   Status New   PT LONG TERM GOAL #3   Title Dynamic Gait Index >/= 19/24 (Target Date: 05/07/14)   Time 8   Period Weeks  Status New   PT LONG TERM GOAL #4   Title ambulates 500' including grass, ramp, curb without device independently. (Target Date: 05/07/14)   Time 8   Period Weeks   Status New               Plan - 04/26/14 1400    Clinical Impression Statement Patient reports feeling better about exercises with hands to help return to prior activities.   Pt will benefit from skilled therapeutic intervention in order to improve on the following deficits Abnormal gait;Decreased activity tolerance;Decreased balance;Decreased coordination;Decreased range of motion;Decreased safety awareness;Decreased mobility;Decreased strength   Rehab Potential Good   PT Frequency 2x / week   PT Duration 8 weeks   PT Treatment/Interventions ADLs/Self Care Home Management;Gait training;Stair training;Functional mobility training;Therapeutic activities;Therapeutic  exercise;Balance training;Neuromuscular re-education;Patient/family education   PT Next Visit Plan progress strength /  balance exercises   PT Home Exercise Plan --   Consulted and Agree with Plan of Care Patient        Problem List There are no active problems to display for this patient.   Jamey Reas PT, DPT 04/26/2014, 3:34 PM  [facility] [address] [place], [place], [address] Phone: [phone]   Fax:  [phone]

## 2014-04-27 ENCOUNTER — Encounter: Payer: Self-pay | Admitting: Podiatry

## 2014-04-27 ENCOUNTER — Ambulatory Visit (INDEPENDENT_AMBULATORY_CARE_PROVIDER_SITE_OTHER): Payer: Medicare HMO | Admitting: Podiatry

## 2014-04-27 ENCOUNTER — Ambulatory Visit (INDEPENDENT_AMBULATORY_CARE_PROVIDER_SITE_OTHER): Payer: Medicare HMO

## 2014-04-27 VITALS — BP 133/71 | HR 72 | Resp 13 | Ht 65.0 in | Wt 198.0 lb

## 2014-04-27 DIAGNOSIS — D492 Neoplasm of unspecified behavior of bone, soft tissue, and skin: Secondary | ICD-10-CM

## 2014-04-27 DIAGNOSIS — M21611 Bunion of right foot: Secondary | ICD-10-CM

## 2014-04-27 DIAGNOSIS — M2011 Hallux valgus (acquired), right foot: Secondary | ICD-10-CM

## 2014-04-27 DIAGNOSIS — M79674 Pain in right toe(s): Secondary | ICD-10-CM

## 2014-04-27 NOTE — Progress Notes (Signed)
   Subjective:    Patient ID: Lindsay Clark, female    DOB: [birth year], 74 y.o.   MRN: [id number]  HPI Comments: Pt states she has a painful nodule on the right plantar 2nd toe for several weeks, and the area had a hard topping which later came off.  Pt states she had a crack in her right heel and it began 04/01/2014, which may have been a carpet tack.  Pt states she had a stroke in 01/2014, that was diagnosed 04/05/2014, and has residual problems with the right 4,5th toes and fingers and has affected her gait also.  Toe Pain  Associated symptoms include numbness.  Foot Injury  Associated symptoms include numbness.      Review of Systems  Neurological: Positive for weakness and numbness.       History of stroke 01/2014. Right foot twitch post-stroke  All other systems reviewed and are negative.      Objective:   Physical Exam        Assessment & Plan:

## 2014-04-27 NOTE — Progress Notes (Signed)
Subjective:     Patient ID: Lindsay Clark, female   DOB: 1940-07-27, 74 y.o.   MRN: [id number]  HPI patient states that she has a small nodule on the right second toe a bunion deformity and she had some pain in the bottom of her right foot that's gone away recently but she was just concerned about it. She suffered a stoke in December and is currently in physical therapy   Review of Systems  All other systems reviewed and are negative.      Objective:   Physical Exam  Constitutional: She is oriented to person, place, and time.  Musculoskeletal: Normal range of motion.  Neurological: She is oriented to person, place, and time.  Skin: Skin is warm and dry.  Nursing note and vitals reviewed.  neurovascular status found to be intact with muscle strength diminished secondary to stroke and range of motion within normal limits. Patient is noted to have a small nodule right second toe medial side measuring 3 x 3 mm and does have structural deformity of the first metatarsal with deviation of the hallux against the second toe. Patient is noted to have a small lesion plantar lateral right heel that is nonpainful currently     Assessment:     Structural bunion deformity some type of soft tissue tumor nodule right second toe that's not pathological and possible wart plantar aspect right foot    Plan:     Reviewed all conditions and reviewed x-rays. At this point I do not recommend aggressive treatment but I did recommend padding of the area and if it should grow in size become painful or change color we will excised the lesion but at this point since she is an aggressive physical therapy we will hold off

## 2014-04-28 ENCOUNTER — Ambulatory Visit: Payer: Medicare HMO | Admitting: Physical Therapy

## 2014-04-28 ENCOUNTER — Encounter: Payer: Self-pay | Admitting: Physical Therapy

## 2014-04-28 DIAGNOSIS — I639 Cerebral infarction, unspecified: Secondary | ICD-10-CM

## 2014-04-28 DIAGNOSIS — I69359 Hemiplegia and hemiparesis following cerebral infarction affecting unspecified side: Secondary | ICD-10-CM | POA: Diagnosis not present

## 2014-04-28 DIAGNOSIS — R269 Unspecified abnormalities of gait and mobility: Secondary | ICD-10-CM

## 2014-04-28 DIAGNOSIS — R531 Weakness: Secondary | ICD-10-CM

## 2014-04-28 NOTE — Therapy (Signed)
[facility] [address] [place], [place], [address] Phone: [phone]   Fax:  [phone]  Physical Therapy Treatment  Patient Details  Name: Lindsay Clark MRN: [id number] Date of Birth: 01-17-1941  Encounter Date: 04/28/2014      PT End of Session - 04/28/14 1100    Visit Number 5   Number of Visits 17   Date for PT Re-Evaluation 06/04/13   Authorization Type orthonet    Authorization Time Period 12/17-1/31/16 AUTH#[id number]   Authorization - Visit Number 5   Authorization - Number of Visits 7   PT Start Time 1100   PT Stop Time 1150   PT Time Calculation (min) 50 min   Equipment Utilized During Treatment Gait belt   Activity Tolerance Patient tolerated treatment well   Behavior During Therapy [facility] for tasks assessed/performed      Past Medical History  Diagnosis Date  . Stroke   . Chronic dermatitis   . Hypertension     Past Surgical History  Procedure Laterality Date  . Tubal ligation  1980    There were no vitals taken for this visit.  Visit Diagnosis:  Abnormality of gait  Weakness generalized  CVA (cerebral vascular accident)      Subjective Assessment - 04/28/14 1106    Symptoms Reports exercises are going well.   Currently in Pain? No/denies              Self-care: patient was instructed in CVA risk factors and signs /symptoms. Neuromuscular Re-ed: Patient was instructed in updated HEP of gait with head turns and high level balance activities.                PT Education - 04/28/14 1100    Education provided Yes   Education Details CVA education & additional HEP - See patient instruction   Person(s) Educated Patient   Methods Explanation;Demonstration;Handout   Comprehension Verbalized understanding;Returned demonstration          PT Short Term Goals - 04/08/14 1847    PT SHORT TERM GOAL #1   Title demonstrates initial HEP correctly. (Target Date:  05/07/14)   Time 4   Period Weeks   Status New   PT SHORT TERM GOAL #2   Title ambulates 500' without device scanning environment with balance loss. (Target Date: 05/07/14)   Time 4   Period Weeks   Status New   PT SHORT TERM GOAL #3   Title Berg Balance >52/56 (Target Date: 05/07/14)   Time 4   Period Weeks   Status New           PT Long Term Goals - 04/08/14 1849    PT LONG TERM GOAL #1   Title verbalizes CVA risk factors & signs/symptoms (Target Date: 06/04/14)   Time 8   Period Weeks   Status New   PT LONG TERM GOAL #2   Title verbalizes / demonstrates HEP / ongoing fitness plan correctly. (Target Date: 05/07/14)   Time 8   Period Weeks   Status New   PT LONG TERM GOAL #3   Title Dynamic Gait Index >/= 19/24 (Target Date: 05/07/14)   Time 8   Period Weeks   Status New   PT LONG TERM GOAL #4   Title ambulates 500' including grass, ramp, curb without device independently. (Target Date: 05/07/14)   Time 8   Period Weeks   Status New  Plan - 04/28/14 1100    Clinical Impression Statement Patient appears to understand CVA risk factors & signs/symptoms and updated HEP.   Pt will benefit from skilled therapeutic intervention in order to improve on the following deficits Abnormal gait;Decreased activity tolerance;Decreased balance;Decreased coordination;Decreased range of motion;Decreased safety awareness;Decreased mobility;Decreased strength   Rehab Potential Good   PT Frequency 2x / week   PT Duration 8 weeks   PT Treatment/Interventions ADLs/Self Care Home Management;Gait training;Stair training;Functional mobility training;Therapeutic activities;Therapeutic exercise;Balance training;Neuromuscular re-education;Patient/family education   PT Next Visit Plan progress strength /  balance exercises   Consulted and Agree with Plan of Care Patient        Problem List There are no active problems to display for this patient.   Jamey Reas PT,  DPT 04/28/2014, 12:19 PM  [facility] [address] [place], [place], [address] Phone: [phone]   Fax:  [phone]

## 2014-04-28 NOTE — Patient Instructions (Signed)
In hallway: 1. Walk with right facing forward. 2. Walk & look right 2 steps, straight 2 steps, left 2 steps 3. Walk & look up 2 steps down 2 steps 4.Walk & look up-right to down-left and then up-left to down-right 5.Walk & hold deck of cards. Use thumb to push card towards opposite hand which picks up card, brings to eye level to read. Place card in back of deck. Start by feeding cards of deck with left then progress to right. 6.stand - flip cards - right, left 7.A-Z in sand 8. Shot marbles 9. Pick up marbles with feet. 10. Face wall with feet towards wall, Side step left & right ~10 steps. 11. Face wall, touch wall - walk sideways crossing one foot in front of other ~5-10 steps to right & then to left. Repeat crossing in back. Repeat alternating front then back 12. March forward then march backward.

## 2014-05-03 ENCOUNTER — Ambulatory Visit: Payer: Medicare HMO | Admitting: Physical Therapy

## 2014-05-03 ENCOUNTER — Encounter: Payer: Self-pay | Admitting: Physical Therapy

## 2014-05-03 DIAGNOSIS — I69359 Hemiplegia and hemiparesis following cerebral infarction affecting unspecified side: Secondary | ICD-10-CM | POA: Diagnosis not present

## 2014-05-03 DIAGNOSIS — R269 Unspecified abnormalities of gait and mobility: Secondary | ICD-10-CM

## 2014-05-03 DIAGNOSIS — I639 Cerebral infarction, unspecified: Secondary | ICD-10-CM

## 2014-05-03 DIAGNOSIS — R531 Weakness: Secondary | ICD-10-CM

## 2014-05-03 NOTE — Therapy (Signed)
[facility] [address] [place], [place], [address] Phone: [phone]   Fax:  [phone]  Physical Therapy Treatment  Patient Details  Name: Lindsay Clark MRN: [id number] Date of Birth: [birth year] Referring Provider:  Vidal Schwalbe, MD  Encounter Date: 05/03/2014      PT End of Session - 05/03/14 1315    Visit Number 6   Number of Visits 17   Date for PT Re-Evaluation 06/04/13   Authorization Type orthonet    Authorization Time Period 12/17-1/31/16 [id number]   Authorization - Visit Number 6   Authorization - Number of Visits 7   PT Start Time 7373   PT Stop Time 1400   PT Time Calculation (min) 45 min   Equipment Utilized During Treatment Gait belt   Activity Tolerance Patient tolerated treatment well   Behavior During Therapy [facility] for tasks assessed/performed      Past Medical History  Diagnosis Date  . Stroke   . Chronic dermatitis   . Hypertension     Past Surgical History  Procedure Laterality Date  . Tubal ligation  1980    There were no vitals taken for this visit.  Visit Diagnosis:  Abnormality of gait  Weakness generalized  CVA (cerebral vascular accident)      Subjective Assessment - 05/03/14 1319    Symptoms Did exercises at home & beach. She is getting better.   Currently in Pain? No/denies                    [facility] Adult PT Treatment/Exercise - 05/03/14 1315    Transfers   Transfers --  Floor Transfers: using chair to push up with cues /demo   Ambulation/Gait   Ambulation/Gait Yes   Ambulation/Gait Assistance 5: Supervision   Ambulation/Gait Assistance Details assessed with Toe-Off AFO   Ambulation Distance (Feet) 500 Feet   Assistive device Other (Comment)  Walking Sticks   Gait Pattern Decreased stride length;Decreased stance time - left;Poor foot clearance - right   Ramp 5: Supervision  With AFO    Curb 5: Supervision  With AFO       Neuromuscular Re-Education: Heel walking & toe walking forward & backward, standing with right leg in neutral rotation with pelvis oriented forward with left leg flexed to increase wt on right leg. PT demo standing at counter.          PT Education - 05/03/14 1315    Education provided Yes   Education Details multi-sensation rubbing leg with different textures & temperatures, floor transfers, updated HEP   Person(s) Educated Patient   Methods Explanation;Demonstration   Comprehension Verbalized understanding;Returned demonstration          PT Short Term Goals - 04/08/14 1847    PT SHORT TERM GOAL #1   Title demonstrates initial HEP correctly. (Target Date: 05/07/14)   Time 4   Period Weeks   Status New   PT SHORT TERM GOAL #2   Title ambulates 500' without device scanning environment with balance loss. (Target Date: 05/07/14)   Time 4   Period Weeks   Status New   PT SHORT TERM GOAL #3   Title Berg Balance >52/56 (Target Date: 05/07/14)   Time 4   Period Weeks   Status New           PT Long Term Goals - 04/08/14 1849    PT LONG TERM GOAL #1   Title verbalizes CVA risk factors & signs/symptoms (Target  Date: 06/04/14)   Time 8   Period Weeks   Status New   PT LONG TERM GOAL #2   Title verbalizes / demonstrates HEP / ongoing fitness plan correctly. (Target Date: 05/07/14)   Time 8   Period Weeks   Status New   PT LONG TERM GOAL #3   Title Dynamic Gait Index >/= 19/24 (Target Date: 05/07/14)   Time 8   Period Weeks   Status New   PT LONG TERM GOAL #4   Title ambulates 500' including grass, ramp, curb without device independently. (Target Date: 05/07/14)   Time 8   Period Weeks   Status New               Plan - 05/03/14 1315    Clinical Impression Statement The Toe-Off AFO improved clearance but continued to externally rotate leg.    Pt will benefit from skilled therapeutic intervention in order to improve on the following deficits Abnormal  gait;Decreased activity tolerance;Decreased balance;Decreased coordination;Decreased range of motion;Decreased safety awareness;Decreased mobility;Decreased strength   Rehab Potential Good   PT Frequency 2x / week   PT Duration 8 weeks   PT Treatment/Interventions ADLs/Self Care Home Management;Gait training;Stair training;Functional mobility training;Therapeutic activities;Therapeutic exercise;Balance training;Neuromuscular re-education;Patient/family education   PT Next Visit Plan progress strength /  balance exercises   Consulted and Agree with Plan of Care Patient        Problem List There are no active problems to display for this patient.   Jamey Reas PT, DPT 05/03/2014, 2:59 PM  [facility] [address] [place], [place], [address] Phone: [phone]   Fax:  [phone]

## 2014-05-05 ENCOUNTER — Ambulatory Visit: Payer: Medicare HMO | Admitting: Physical Therapy

## 2014-05-05 ENCOUNTER — Encounter: Payer: Self-pay | Admitting: Physical Therapy

## 2014-05-05 DIAGNOSIS — R269 Unspecified abnormalities of gait and mobility: Secondary | ICD-10-CM

## 2014-05-05 DIAGNOSIS — I69359 Hemiplegia and hemiparesis following cerebral infarction affecting unspecified side: Secondary | ICD-10-CM | POA: Diagnosis not present

## 2014-05-05 DIAGNOSIS — I639 Cerebral infarction, unspecified: Secondary | ICD-10-CM

## 2014-05-05 DIAGNOSIS — R531 Weakness: Secondary | ICD-10-CM

## 2014-05-05 NOTE — Therapy (Signed)
[facility] [address] [place], [place], [address] Phone: [phone]   Fax:  [phone]  Physical Therapy Treatment  Patient Details  Name: Lindsay Clark MRN: [id number] Date of Birth: [birth year] Referring Provider:  Vidal Schwalbe, MD  Encounter Date: 05/05/2014      PT End of Session - 05/05/14 1315    Visit Number 7   Number of Visits 17   Date for PT Re-Evaluation 06/04/13   Authorization Type orthonet    Authorization Time Period 12/17-1/31/16 [id number]   Authorization - Visit Number 7   Authorization - Number of Visits 7   PT Start Time 1320   PT Stop Time 1410   PT Time Calculation (min) 50 min   Equipment Utilized During Treatment Gait belt   Activity Tolerance Patient tolerated treatment well   Behavior During Therapy [facility] for tasks assessed/performed      Past Medical History  Diagnosis Date  . Stroke   . Chronic dermatitis   . Hypertension     Past Surgical History  Procedure Laterality Date  . Tubal ligation  1980    There were no vitals taken for this visit.  Visit Diagnosis:  Abnormality of gait  Weakness generalized  CVA (cerebral vascular accident)      Subjective Assessment - 05/05/14 1324    Symptoms Her mobility has improved.   Currently in Pain? No/denies          [facility] PT Assessment - 05/05/14 1315    Standardized Balance Assessment   Balance Master Testing Sensory Organization Test   Balance Master Testing    Results Sensory Organization Test Composite Score 72  All sensory analysis above normal.                  [facility] Adult PT Treatment/Exercise - 05/05/14 1315    Ambulation/Gait   Ambulation/Gait Yes Patient ambulated 500' including ramp, curb & stairs without device independently.   Berg Balance Test   Sit to Stand Able to stand without using hands and stabilize independently   Standing Unsupported Able to stand safely 2 minutes    Sitting with Back Unsupported but Feet Supported on Floor or Stool Able to sit safely and securely 2 minutes   Stand to Sit Sits safely with minimal use of hands   Transfers Able to transfer safely, minor use of hands   Standing Unsupported with Eyes Closed Able to stand 10 seconds safely   Standing Ubsupported with Feet Together Able to place feet together independently and stand 1 minute safely   From Standing, Reach Forward with Outstretched Arm Can reach confidently >25 cm (10")   From Standing Position, Pick up Object from Floor Able to pick up shoe safely and easily   From Standing Position, Turn to Look Behind Over each Shoulder Looks behind from both sides and weight shifts well   Turn 360 Degrees Able to turn 360 degrees safely in 4 seconds or less   Standing Unsupported, Alternately Place Feet on Step/Stool Able to stand independently and safely and complete 8 steps in 20 seconds   Standing Unsupported, One Foot in Front Able to plae foot ahead of the other independently and hold 30 seconds   Standing on One Leg Able to lift leg independently and hold equal to or more than 3 seconds   Total Score 53   Dynamic Gait Index   Level Surface Normal   Change in Gait Speed Normal   Gait with  Horizontal Head Turns Normal   Gait with Vertical Head Turns Normal   Gait and Pivot Turn Normal   Step Over Obstacle Mild Impairment   Step Around Obstacles Normal   Steps Normal   Total Score 23   Timed Up and Go Test   Normal TUG (seconds) 6.58   Cognitive TUG (seconds) 7.51                PT Education - 05/05/14 1408    Education provided Yes   Education Details functional outcomes compared to initial   Person(s) Educated Patient   Methods Explanation   Comprehension Verbalized understanding          PT Short Term Goals - 05/05/14 1315    PT SHORT TERM GOAL #1   Title demonstrates initial HEP correctly. (Target Date: 05/07/14)   Baseline MET 05/04/14   Time 4   Period  Weeks   Status Achieved   PT SHORT TERM GOAL #2   Title ambulates 500' without device scanning environment with balance loss. (Target Date: 05/07/14)   Baseline MET 05/04/14   Time 4   Period Weeks   Status Achieved   PT SHORT TERM GOAL #3   Title Berg Balance >52/56 (Target Date: 05/07/14)   Baseline MET 05/04/14 53/56   Time 4   Period Weeks   Status Achieved           PT Long Term Goals - 05/05/14 1315    PT LONG TERM GOAL #1   Title verbalizes CVA risk factors & signs/symptoms (Target Date: 06/04/14)   Baseline MET 1/121/6   Time 8   Period Weeks   Status Achieved   PT LONG TERM GOAL #2   Title verbalizes / demonstrates HEP / ongoing fitness plan correctly. (Target Date: 05/07/14)   Baseline MET 05/04/14    Time 8   Period Weeks   Status Achieved   PT LONG TERM GOAL #3   Title Dynamic Gait Index >/= 19/24 (Target Date: 05/07/14)   Baseline Dynamic Gait Index 23/24   Time 8   Period Weeks   Status Achieved   PT LONG TERM GOAL #4   Title ambulates 500' including grass, ramp, curb without device independently. (Target Date: 05/07/14)   Baseline MET 05/04/14   Time 8   Period Weeks   Status Achieved               Plan - 05/05/14 1315    Clinical Impression Statement Patient met all STG & LTGs.   Pt will benefit from skilled therapeutic intervention in order to improve on the following deficits Abnormal gait;Decreased activity tolerance;Decreased balance;Decreased coordination;Decreased range of motion;Decreased safety awareness;Decreased mobility;Decreased strength   Rehab Potential Good   PT Frequency 2x / week   PT Duration 8 weeks   PT Treatment/Interventions ADLs/Self Care Home Management;Gait training;Stair training;Functional mobility training;Therapeutic activities;Therapeutic exercise;Balance training;Neuromuscular re-education;Patient/family education   PT Next Visit Plan progress strength /  balance exercises   Consulted and Agree with Plan of Care  Patient        Problem List There are no active problems to display for this patient.   Jamey Reas PT, DPT 05/05/2014, 2:21 PM  [facility] [address] [place], [place], [address] Phone: [phone]   Fax:  [phone]

## 2014-05-10 ENCOUNTER — Ambulatory Visit: Payer: Medicare HMO | Admitting: Physical Therapy

## 2014-05-11 ENCOUNTER — Encounter: Payer: Self-pay | Admitting: Diagnostic Neuroimaging

## 2014-05-11 ENCOUNTER — Ambulatory Visit (INDEPENDENT_AMBULATORY_CARE_PROVIDER_SITE_OTHER): Payer: Medicare HMO | Admitting: Diagnostic Neuroimaging

## 2014-05-11 VITALS — BP 157/87 | HR 85 | Temp 98.3°F | Ht 64.0 in | Wt 193.4 lb

## 2014-05-11 DIAGNOSIS — I63312 Cerebral infarction due to thrombosis of left middle cerebral artery: Secondary | ICD-10-CM

## 2014-05-11 NOTE — Progress Notes (Signed)
[facility]  PATIENT: Lindsay Clark DOB: 1941/03/19  REFERRING CLINICIAN: White  HISTORY FROM: patient  REASON FOR VISIT: new consult    HISTORICAL  CHIEF COMPLAINT:  Chief Complaint  Patient presents with  . Follow-up    "i feel that no one is helping me after my stroke and i am frustrated"    HISTORY OF PRESENT ILLNESS:   UPDATE 05/11/14: Since last visit, patient has continued with right foot involuntary movements, mild gait difficulty, involuntary movements of right hand, esp with 4th and 5th finger   PRIOR HPI (04/19/14): 74 year old right-handed female with hypertension, chronic dermatitis, here for evaluation of stroke. April 2015 patient was having right hip pain, radiating to the right leg. Symptoms progressively worsened. November 2015 patient had progressive decreased energy, decreased right leg strength, difficulty performing household chores and cleaning. By Thanksgiving 2015 patient was having difficulty with her organizational skills and difficulty preparing Thanksgiving dinner. On 03/23/2014 patient had difficulty playing organ at church. Patient also noted involuntary movements of her right foot during this time. Patient having increasing pressure in her head. 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 Patient had MRI brain, MRA head, carotid ultrasound, echocardiogram which I have reviewed. Patient has been evaluated by physical and occupational therapy as well. Patient now on aspirin 81 mg daily.   REVIEW OF SYSTEMS: Full 14 system review of systems performed and notable only for chills fatigue swelling in feet itching snoring increased thirst right leg movements numbness headache weakness change in appetite.    ALLERGIES: Allergies  Allergen Reactions  . Amlodipine     HOME MEDICATIONS: Outpatient Prescriptions Prior to Visit  Medication Sig Dispense Refill  .  clobetasol cream (TEMOVATE) 9.02 % Apply 1 application topically 2 (two) times daily.    Marland Kitchen escitalopram (LEXAPRO) 5 MG tablet Take 1 tablet by mouth daily.    Marland Kitchen gabapentin (NEURONTIN) 300 MG capsule Take 300 mg by mouth daily.     . hydrochlorothiazide (HYDRODIURIL) 25 MG tablet Take 25 mg by mouth daily.    Marland Kitchen LORazepam (ATIVAN) 0.5 MG tablet Take 1 tablet by mouth daily.    . ramipril (ALTACE) 10 MG capsule Take 20 mg by mouth daily.     . tacrolimus (PROTOPIC) 0.1 % ointment Apply topically 2 (two) times daily.     No facility-administered medications prior to visit.    PAST MEDICAL HISTORY: Past Medical History  Diagnosis Date  . Stroke   . Chronic dermatitis   . Hypertension     PAST SURGICAL HISTORY: Past Surgical History  Procedure Laterality Date  . Tubal ligation  1980    FAMILY HISTORY: Family History  Problem Relation Age of Onset  . Stroke Mother   . Colon cancer Mother   . Heart attack Father   . Emphysema Father     SOCIAL HISTORY:  History   Social History  . Marital Status: Married    Spouse Name: [name]    Number of Children: 2  . Years of Education: MAx2   Occupational History  . Retired Other   Social History Main Topics  . Smoking status: Never Smoker   . Smokeless tobacco: Never Used  . Alcohol Use: 0.0 oz/week    0 Not specified per week     Comment: 4 glasses of wine weekly  . Drug Use: No  . Sexual Activity: Not on file  Other Topics Concern  . Not on file   Social History Narrative   Patient lives at home with her spouse.   Caffeine Use: 2-3 cups daily     PHYSICAL EXAM  Filed Vitals:   05/11/14 1533  BP: 157/87  Pulse: 85  Temp: 98.3 F (36.8 C)  TempSrc: Oral  Height: 5\' 4"  (1.626 m)  Weight: 193 lb 6.4 oz (87.726 kg)    Body mass index is 33.18 kg/(m^2).  No exam data present  No flowsheet data found.  GENERAL EXAM: Patient is UPSET, BUT CALM; DETAIL ORIENTED, LOQUACIOUS; well developed, nourished and  groomed; neck is supple;   CARDIOVASCULAR: Regular rate and rhythm, no murmurs   NEUROLOGIC: MENTAL STATUS: awake, alert, language fluent, comprehension intact, naming intact, fund of knowledge appropriate CRANIAL NERVE: pupils equal and reactive to light, visual fields full to confrontation, extraocular muscles intact, no nystagmus, facial sensation symmetric, DECR RIGHT NL FOLD. Hearing intact, palate elevates symmetrically, uvula midline, shoulder shrug symmetric, tongue midline. MOTOR: normal bulk and tone, full strength in the LUE, LLE; RUE (TRICEPS 4, OTHERWISE 5) WITH DYSTONIC POSTURING OF RIGHT HAND; RLE (KF 4, OTHERWISE 5); SLIGHT DECREASED FINGER TAPPING DEXTERITY ON RIGHT SIDE; CHOREIFORM MOVEMENTS IN RIGHT FOOT/ANKLE SENSORY: normal and symmetric to light touch COORDINATION: finger-nose-finger, fine finger movements SLIGHTLY DYSMETRIC ON RIGHT SIDE REFLEXES: deep tendon reflexes present and symmetric GAIT/STATION: narrow based gait; able to walk on toes, heels; DIFF WITH TANDEM; RIGHT LEG TURNED OUTWARD SLIGHTLY; romberg is negative; DECR RIGHT ARM SWING WITH DYSTONIC POSTURING OF FINGERS    DIAGNOSTIC DATA (LABS, IMAGING, TESTING) - I reviewed patient records, labs, notes, testing and imaging myself where available.  No results found for: WBC, HGB, HCT, MCV, PLT No results found for: NA, K, CL, CO2, GLUCOSE, BUN, CREATININE, CALCIUM, PROT, ALBUMIN, AST, ALT, ALKPHOS, BILITOT, GFRNONAA, GFRAA No results found for: CHOL, HDL, LDLCALC, LDLDIRECT, TRIG, CHOLHDL No results found for: HGBA1C No results found for: VITAMINB12 No results found for: TSH  I reviewed images myself and agree with interpretation. -VRP  04/05/14 MRI brain - A late subacute LEFT MCA territory infarct. The appearance of this infarct would be consistent with a time course of an event beginning in October. Moderate chronic microvascular ischemic change throughout the white matter without intracranial  hemorrhage. The white matter lesions are unlikely to represent a demyelinating process.   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  04/14/14 MRA HEAD 1. No evidence of major intracranial arterial occlusion or significant proximal stenosis. Mild branch vessel irregularity suggestive of atherosclerosis. 2. 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  04/13/14 TTE - Normal LV function; grade 1 diastolic dysfunction; trace MR.   04/14/14 CAROTID U/S 1. Intimal thickening throughout both CCA's. 2. 1-39% bilateral ICA stenosis.  3. Patent vertebral arteries with antegrade flow. 4. Normal subclavian arteries, bilaterally.     ASSESSMENT AND PLAN  74 y.o. 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 Stroke workup completed.   Of note, the only unusual feature is subacute/gradual onset of symptoms. Will follow patient over time. We will repeat MRI of the brain in future if symptoms change, to exclude inflammatory or neoplastic process.  PLAN: - continue aspirin 81mg  daily - lipid panel per PCP; start statin if LDL > 100 -  BP control - gradually increase activity level; patient planning to see ortho PT - repeat MRI in March 2016 - follow up in April 2016  Orders Placed This Encounter  Procedures  . MR Brain W Wo Contrast   Return in about 3 months (around 08/10/2014).    Penni Bombard, MD 4/82/5003, 7:04 PM Certified in Neurology, Neurophysiology and Neuroimaging  [facility] [address], [place], [place] [address] [phone]

## 2014-05-11 NOTE — Patient Instructions (Signed)
I will check MRI in March 2016 and then follow up in April 2016.

## 2014-05-12 ENCOUNTER — Ambulatory Visit: Payer: Self-pay | Admitting: Diagnostic Neuroimaging

## 2014-05-12 ENCOUNTER — Ambulatory Visit: Payer: Medicare HMO | Admitting: Physical Therapy

## 2014-05-17 ENCOUNTER — Ambulatory Visit: Payer: Medicare HMO | Admitting: Physical Therapy

## 2014-05-19 ENCOUNTER — Ambulatory Visit: Payer: Medicare HMO | Admitting: Physical Therapy

## 2014-05-20 DIAGNOSIS — H3531 Nonexudative age-related macular degeneration: Secondary | ICD-10-CM | POA: Diagnosis not present

## 2014-05-20 DIAGNOSIS — H35373 Puckering of macula, bilateral: Secondary | ICD-10-CM | POA: Diagnosis not present

## 2014-05-20 DIAGNOSIS — H43813 Vitreous degeneration, bilateral: Secondary | ICD-10-CM | POA: Diagnosis not present

## 2014-05-24 ENCOUNTER — Ambulatory Visit: Payer: Medicare PPO | Admitting: Physical Therapy

## 2014-05-26 ENCOUNTER — Ambulatory Visit: Payer: Medicare HMO | Admitting: Physical Therapy

## 2014-06-29 ENCOUNTER — Other Ambulatory Visit: Payer: Self-pay | Admitting: Family Medicine

## 2014-06-29 ENCOUNTER — Ambulatory Visit: Payer: Medicare PPO | Attending: Family Medicine | Admitting: Psychology

## 2014-06-29 DIAGNOSIS — I63412 Cerebral infarction due to embolism of left middle cerebral artery: Secondary | ICD-10-CM

## 2014-06-29 DIAGNOSIS — F4322 Adjustment disorder with anxiety: Secondary | ICD-10-CM

## 2014-06-29 DIAGNOSIS — I6930 Unspecified sequelae of cerebral infarction: Secondary | ICD-10-CM

## 2014-06-29 DIAGNOSIS — R269 Unspecified abnormalities of gait and mobility: Secondary | ICD-10-CM | POA: Insufficient documentation

## 2014-06-29 DIAGNOSIS — R9389 Abnormal findings on diagnostic imaging of other specified body structures: Secondary | ICD-10-CM

## 2014-06-29 DIAGNOSIS — I69359 Hemiplegia and hemiparesis following cerebral infarction affecting unspecified side: Secondary | ICD-10-CM | POA: Insufficient documentation

## 2014-06-29 NOTE — Progress Notes (Signed)
Jamey Ripa, Ph.D [facility]  [address]   Telephone [phone] [address] Fax [phone] [place], [place] [address]   PSYCHOLOGICAL EVALUATION  *CONFIDENTIAL* This report should not be released without the consent of the client  Name:   ESBEIDY MCLAINE Date of Birth:  [birth year] [facility] MR#:  [id number] Date of Evaluation: 06/30/14  Reason For Referral Yania Bogie is a 74 year-old, right-handed woman who was referred by her primary care physician, [name] MD, due to Ms. Stubblefield's report of concentration difficulties and emotional swings since a stroke in 2015. 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 Initial problems included right hand dystonia, right foot/ankle chorea and mild gait difficulty. Outpatient Occupational therapy services were not deemed necessary as she tested within normal limits on all areas on an Occupational therapy evaluation in 03/2014. She was discharged from outpatient Physical Therapy services on 05/08/14 after improved mobility.   Evaluation Procedures Review of Medical Records Clinical Interview Geriatric Anxiety Scale  Geriatric Depression Scale  Chief Complaints "I am disillusioned with the healthcare system".  "I have concerns about the tests used by the Occupational Therapist".  "I don't know why I was dismissed by Occupational Therapy".   "I cry with frustration when I am unable to do something that I need to do" "I want to be like I was at 54, just before my stroke".  Ms. Dusing spent much of this initial interview expressing her displeasure with several health care providers who by her reckoning did not sufficiently help her to achieve maximal improvement after her stroke. She cited her current problems as reduced right hand coordination, decreased gait, sensitivity to loud noise and shorter duration  of sleep due to early morning awakening and being mentally and physically fatigued by the end of the day. She reported that her cognitive skills have for the most part been well-preserved though described herself as less able to multi-task (e.g., difficulty preparing multiple dishes for a dinner party), unable to finish reading materials and experiencing a perception of time passing more slowly than before.     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 Otherwise, she has maintained a fully independent lifestyle.  She stated her belief that she is not depressed but added that her daughter has recently raised this idea with her. She agreed that she has not accepted the possibility that she might not regain some of her physical and cognitive capacities and added "I've always been able to do everything.I've always been a strong person". She reported that she will burst into tears of frustration when aware of not performing a task to her standards. Some days she does not cry at all while on other days she might experience two or three crying episodes that last a few minutes. She described herself prior to her stroke as having been prone to easily cry to sad movies or evocative music.  She denied experiencing apathy, anhedonia, mood lability, hopelessness, suicidal or homicidal ideation, problems with impulse control, hallucinations or delusional ideas. She reported that her primary care physician recently prescribed her lorazepam and escitalopram but that she has decided not to take those medications.   Background She lives with her husband. She reported that he has been recently disabled due to back problems. They  have two adult children. 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 She  reported having earned a Conservator, museum/gallery in [employer] and a Master's degree in Communication. Her past medical history was notable for hypertension and chronic dermatitis. Current medications include gabapentin, hydrochlorothiazide and ramipril. She reported no prior history of emotional difficulties, substance abuse or mental health contacts.   Symptom Questionnaires Her score of 2/25 on the Geriatric Depression Scale did not indicate depression. She did not answer five items due to uncertainty. The two items she endorsed were frequently getting upset over little things and trouble concentrating. Her score of 12 on the Geriatric Anxiety Scale indicated that she reported a minimal to mild level of anxiety.  No symptom was endorsed at more than "sometimes" in terms of frequency. Of note, she criticized these tests as being inadequate measures of her psychological state.   Observations She appeared as an appropriately groomed and dressed woman in no apparent physical distress. She did not exhibit any unusual motor activity or mannerisms. She interacted in a pleasant manner. She spoke in a normal tone of voice, maintained good eye contact and responded to all questions. She was talkative to the point of needing to be interrupted at times. Her mood seemed irritable in context of her complaints about some health care providers but was otherwise euthymic. Her affect appeared within a wide range and was well-modulated. Her speech was well articulated and of normal prosody. Her word usage and word retrieval were normal. She had no apparent problems understanding what was said to her. Her thought processes were coherent and organized without loose associations, verbal perseverations or flight of ideas. There were no indications of unusual thought content or delusional thinking.  Impressions Ms. 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 She has not agreed with the implicit feedback from some health care professionals that further rehabilitative therapies will not measurably help her. She realizes that her stroke-related deficits are relatively mild and her level of functioning is quite high when compared to most persons with stroke. Her intermittent crying seems to be a manifestation of her anxiety and agitation in reaction to perceived failure rather than stroke-related affective lability. It is possible that anxiety is also directly disrupting her attentional skills. She did not report being depressed at this time. We discussed her expectations and goals of neuropsychological services, which were framed to her as helping her to emotionally cope with her residual deficits rather than improving her functioning.   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 Advised her to look into improving her divided attention via an online cognitive training program, such as http://www.cook.info/.  2. Scheduled a follow-up meeting in two weeks to include her husband.    I have appreciated the opportunity to meet with Ms. Albertsen.   __________________ Antionette Poles, Ph.D Licensed Psychologist

## 2014-06-30 ENCOUNTER — Ambulatory Visit
Admission: RE | Admit: 2014-06-30 | Discharge: 2014-06-30 | Disposition: A | Discharge status: Home / Self Care | Payer: Medicare PPO | Source: Ambulatory Visit | Attending: Family Medicine | Admitting: Family Medicine

## 2014-06-30 ENCOUNTER — Other Ambulatory Visit: Payer: Medicare HMO

## 2014-06-30 DIAGNOSIS — R9389 Abnormal findings on diagnostic imaging of other specified body structures: Secondary | ICD-10-CM

## 2014-06-30 MED ORDER — GADOBENATE DIMEGLUMINE 529 MG/ML IV SOLN: 18.0000 mL | Freq: Once | INTRAVENOUS | Status: AC | PRN

## 2014-07-05 ENCOUNTER — Ambulatory Visit (INDEPENDENT_AMBULATORY_CARE_PROVIDER_SITE_OTHER): Payer: Medicare PPO | Admitting: Neurology

## 2014-07-05 ENCOUNTER — Encounter: Payer: Self-pay | Admitting: Neurology

## 2014-07-05 ENCOUNTER — Other Ambulatory Visit: Payer: Self-pay | Admitting: Neurology

## 2014-07-05 VITALS — BP 118/74 | HR 74 | Temp 98.4°F | Resp 18 | Ht 64.0 in | Wt 192.7 lb

## 2014-07-05 DIAGNOSIS — I63312 Cerebral infarction due to thrombosis of left middle cerebral artery: Secondary | ICD-10-CM

## 2014-07-05 NOTE — Progress Notes (Signed)
NEUROLOGY CONSULTATION NOTE  BLOSSOM CRUME MRN: [id number] DOB: 08/06/1940  Referring provider: Dr. Dema Severin Primary care provider: Dr. Dema Severin  Reason for consult:  Stroke  HISTORY OF PRESENT ILLNESS: Lindsay Clark is a 74 year old right-handed woman with hypertension who presents for stroke.  Records, MRIs from December and March, and Echo and doppler reports reviewed.  She is accompanied by her husband who provides some history.  In October-early November, she began noticing trouble gripping the fork with her right hand, as well as peeling off tops of cans or opening jars.  On Thanksgiving, she noticed trouble multitasking, specifically preparing all the food for dinner.  She also felt a sensation that time was moving slowly.  After Thanksgiving, she had trouble using her 4th and 5th fingers on the right hand to play the piano, and she also had trouble maintaining stability of her right foot/ankle on the pedal.  She noted numbness involving the right hand and foot.  She began exhibiting dystonic posturing of the right hand as well as twitching of the right foot.  At one point, her husband noted that she had right facial droop and slurred speech.  She also reports that food doesn't taste good and therefore she has lost about 12 lbs since Thanksgiving.  She denied fever.  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  MRI of cervical spine showed spondylosis but no compressive right sided lesion that would account for right sided weakness.  MRA of head performed 04/13/14 showed no major intracranial stenosis.  2D echo performed on 04/13/14 was unremarkable with a LVEF of 55-60% and no source of embolus.  Carotid duplex showed no hemodynamically significant ICA stenosis.  She was started on ASA 81mg  daily and she went to physical therapy.  Her strength has dramatically improved.   She occasionally has episode of head pressure but not headache.  However, she continues to have dysgeusia and twitching of the right foot at times, particularly when she is laying down.  She is able to suppress it when it occurs but not indefinitely.  In addition to difficulty with concentration and multitasking, she exhibited emotional lability where she would easily cry.  .  She had another MRI of the brain with and without contrast performed on 06/30/14 which appears unchanged except for possible increased T2 signal and small smudgy areas of enhancement within the left basal ganglia.  However the radiologist raised the possibility of an infiltrating glioma because of the absence of typical evolutionary changes of infarction.  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   Due to persistent difficulty with concentration and emotional lability, she had neuropsychological testing performed on 06/30/14, which indicates that these symptoms are more likely related to anxiety and agitation rather than an organic etiology.  Of note, last spring, she began having right hip pain and difficulty walking due to right hip bursitis, which has improved with PT.  PAST MEDICAL HISTORY: Past Medical History  Diagnosis Date  . Stroke   . Chronic dermatitis   . Hypertension   . Thrombotic stroke involving middle cerebral artery   . [redacted] week gestation of pregnancy     PAST SURGICAL HISTORY: Past Surgical History  Procedure Laterality Date  . Tubal ligation  1980    MEDICATIONS: Current Outpatient Prescriptions on File Prior to Visit  Medication Sig Dispense Refill  .  clobetasol cream (TEMOVATE) 6.31 % Apply 1 application topically 2 (two) times daily.    . Clobetasol Propionate 0.05 % shampoo     . gabapentin (NEURONTIN) 300 MG capsule Take 300 mg by mouth daily.     . hydrochlorothiazide (HYDRODIURIL) 25 MG tablet Take 25 mg by mouth daily.    . ramipril (ALTACE) 10 MG capsule Take 20 mg by  mouth daily.     . tacrolimus (PROTOPIC) 0.1 % ointment Apply topically 2 (two) times daily.    Marland Kitchen escitalopram (LEXAPRO) 5 MG tablet Take 1 tablet by mouth daily.    Marland Kitchen LORazepam (ATIVAN) 0.5 MG tablet Take 1 tablet by mouth daily.     No current facility-administered medications on file prior to visit.    ALLERGIES: Allergies  Allergen Reactions  . Amlodipine     FAMILY HISTORY: Family History  Problem Relation Age of Onset  . Stroke Mother   . Colon cancer Mother   . Heart attack Father   . Emphysema Father   . Thyroid disease Son   . Thyroid disease Maternal Grandmother   . Cancer Maternal Grandmother     cervical   . Thrombosis Maternal Grandfather     SOCIAL HISTORY: History   Social History  . Marital Status: Married    Spouse Name: [name]  . Number of Children: 2  . Years of Education: MAx2   Occupational History  . Retired Other   Social History Main Topics  . Smoking status: Never Smoker   . Smokeless tobacco: Never Used  . Alcohol Use: 0.0 oz/week    0 Standard drinks or equivalent per week     Comment: 4 glasses of wine weekly  . Drug Use: No  . Sexual Activity: No   Other Topics Concern  . Not on file   Social History Narrative   Patient lives at home with her spouse.   Caffeine Use: 2-3 cups daily    REVIEW OF SYSTEMS: Constitutional: No fevers, chills, or sweats, no generalized fatigue, change in appetite Eyes: No visual changes, double vision, eye pain Ear, nose and throat: Foul taste.  No hearing loss, ear pain, nasal congestion, sore throat Cardiovascular: No chest pain, palpitations Respiratory:  No shortness of breath at rest or with exertion, wheezes GastrointestinaI: No nausea, vomiting, diarrhea, abdominal pain, fecal incontinence Genitourinary:  No dysuria, urinary retention or frequency Musculoskeletal:  No neck pain, back pain Integumentary: No rash, pruritus, skin lesions Neurological: as above Psychiatric:  anxiety Endocrine: No palpitations, fatigue, diaphoresis, mood swings, change in appetite, change in weight, increased thirst Hematologic/Lymphatic:  No anemia, purpura, petechiae. Allergic/Immunologic: no itchy/runny eyes, nasal congestion, recent allergic reactions, rashes  PHYSICAL EXAM: Filed Vitals:   07/05/14 1019  BP: 118/74  Pulse: 74  Temp: 98.4 F (36.9 C)  Resp: 18   General: No acute distress Head:  Normocephalic/atraumatic Eyes:  fundi unremarkable, without vessel changes, exudates, hemorrhages or papilledema. Neck: supple, no paraspinal tenderness, full range of motion Back: No paraspinal tenderness Heart: regular rate and rhythm Lungs: Clear to auscultation bilaterally. Vascular: No carotid bruits. Neurological Exam: Mental status: alert and oriented to person, place, and time, recent and remote memory intact, fund of knowledge intact, attention and concentration mildly impaired but serial 7 subtraction intact, speech fluent and not dysarthric, language intact with minor impairment with naming fluency. Cranial nerves: CN I: not tested CN II: pupils equal, round and reactive to light, visual fields intact, fundi unremarkable, without vessel changes, exudates, hemorrhages or  papilledema. CN III, IV, VI:  full range of motion, no nystagmus, no ptosis CN V: facial sensation intact CN VII: upper and lower face symmetric CN VIII: hearing intact CN IX, X: gag intact, uvula midline CN XI: sternocleidomastoid and trapezius muscles intact CN XII: tongue midline Bulk & Tone: normal, no fasciculations. Motor:  5/5 throughout, mildly reduced amplitude and speed with right finger-thumb tapping. Sensation:  Slight reduced pinprick sensation in right hand.  Vibration sensation intact. Deep Tendon Reflexes:  2+ throughout, toes downgoing. Finger to nose testing:  Slightly slow on right but no dysmetria Heel to shin:  intact Gait:  Stumbles a little to the left.  Able to turn.   Difficulty with tandem walking. Romberg negative.  IMPRESSION: 1.  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  The fact that the lesion on MRI is so distinct within a specific vascular territory would correlate with stroke rather than a glioma.  Also, since her symptoms have overall dramatically improved, also suggest stroke rather than glioma. 2.  Cognitive issues related to anxiety.  Her symptoms of difficulty concentrating and dysgeusia do not correlate with lesion on brain.  PLAN: 1.  I would continue secondary stroke prevention with ASA 81mg  and statin therapy (LDL goal less than 100) 2.  Since there is a question of possible glioma, I would repeat MRI of brain with and without contrast in 3 months 3.  Follow up soon after repeat MRI of brain.  She is to call with questions or concerns.  Thank you for allowing me to take part in the care of this patient.  Metta Clines, DO  CC:  [name], MD

## 2014-07-05 NOTE — Patient Instructions (Addendum)
At this point, it seems more likely to be a stroke.  I would repeat MRI of brain with and without contrast in 3 months with follow up soon after.  Call with questions or concerns. [facility] 10/05/14 at 2:45pm

## 2014-07-13 ENCOUNTER — Telehealth: Payer: Self-pay | Admitting: Neurology

## 2014-07-13 NOTE — Telephone Encounter (Signed)
Patient and I spoke and I do see what she is saying about the my chart and is showing no problems in the problem list which we both know is incorrect . I will let the practice manager Sherri look into this for her

## 2014-07-13 NOTE — Telephone Encounter (Signed)
Pt called wanting to speak to a nurse regarding the information she has on her "mychart". Please call her # [phone]

## 2014-07-14 ENCOUNTER — Ambulatory Visit (INDEPENDENT_AMBULATORY_CARE_PROVIDER_SITE_OTHER): Payer: Medicare PPO | Admitting: Psychology

## 2014-07-14 DIAGNOSIS — F4322 Adjustment disorder with anxiety: Secondary | ICD-10-CM | POA: Diagnosis not present

## 2014-07-14 NOTE — Progress Notes (Signed)
[facility]  [address]   Telephone [phone] [address] Fax [phone] [place], [place] [address]   Psychology Progress Note   Name:  Lindsay Clark Date of Birth:  1940-06-24 MRN:  [id number]  Date:07/14/2014 (33m psychotherapy Met with [name] and her husband, who came at my request.  Her husband reported that over the past four weeks she has shown improved attentional and mult-tasking skills, better frustration tolerance and decreased instances of brief crying (both agreed are representative of frustration). He described her as typically in good spirits though worried about her health. She has resumed many of her previously enjoyed recreational and leisure activities.   [name] persisting problems of diminished taste sensation, sensitivity to loud noise, decreased eye-hand coordination (improved: she was able to play the piano to an acceptable level), reduced multi-tasking (improved- she was able to prepare a dinner for several guests last week without difficulty) and shorter sleep duration. She reports that her level of frustration in reaction to being aware of not performing a task as well as before has diminished. She did not report of persisting emotional distress, thoughts of suicide or major problems in living.  With regards to her complaint about reduced multi-tasking ability, I had previously suggested that she check out [employer], an online cognitive retraining program. Last week she sent me an e-mail enumerating multiple criticisms about this program.   They both reported anxiety about possible uncertainty regarding her neurological diagnosis. They wonder whether she has a glioma rather than a CVA based on the interpretation of a recent brain MRI scan. I explained that this question is out of my range of competency; and I directed them to discuss this with her neurologist. They have decided to wait for her three month follow-up  appointment [facility].They agreed that some of their shared anxiety and mistrust of medical professionals stems from a recent incident in which a physician reportedly failed to identify a serious problem with Mr. FDucesurgically repaired back.   She presented as pleasant, verbal and with bright affect that was well-modulated.  Discussed the challenges for her in coping with neurological symptoms after having been relatively healthy her whole life and having previously enjoyed a mostly unwavering sense of competence in her abilities.   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 She agreed. She was told that she is welcome to meet with me again on an as needed basis.    MJamey Ripa Ph.D Licensed Psychologist

## 2014-08-09 ENCOUNTER — Ambulatory Visit (INDEPENDENT_AMBULATORY_CARE_PROVIDER_SITE_OTHER): Payer: Medicare PPO | Admitting: Neurology

## 2014-08-09 ENCOUNTER — Encounter: Payer: Self-pay | Admitting: Neurology

## 2014-08-09 ENCOUNTER — Other Ambulatory Visit: Payer: Self-pay | Admitting: *Deleted

## 2014-08-09 VITALS — BP 110/70 | HR 82 | Resp 18 | Ht 64.8 in | Wt 190.2 lb

## 2014-08-09 DIAGNOSIS — R930 Abnormal findings on diagnostic imaging of skull and head, not elsewhere classified: Secondary | ICD-10-CM

## 2014-08-09 DIAGNOSIS — I63312 Cerebral infarction due to thrombosis of left middle cerebral artery: Secondary | ICD-10-CM | POA: Diagnosis not present

## 2014-08-09 DIAGNOSIS — R9089 Other abnormal findings on diagnostic imaging of central nervous system: Secondary | ICD-10-CM

## 2014-08-09 DIAGNOSIS — F4322 Adjustment disorder with anxiety: Secondary | ICD-10-CM

## 2014-08-09 NOTE — Progress Notes (Signed)
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  Labs and records reviewed.  She is accompanied by her husband who provides some history.  UPDATE: LDL was 111.  She was started on pravastatin 20mg  daily.   She returns today to discuss continued concerns regarding her progress.  She has gone through two rounds of PT.  She noted particular benefit with orthopedic PT.  She feels that her right ankle is turning more and she feels increased difficulty using her hand to write.  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  She also occasionally notes brief episodes of head pressure from time to time.  She is frustrated with not knowing completely what is going on as her gradual onset of symptoms are not typical presentation for stroke.  HISTORY: In October-early November, she began noticing trouble gripping the fork with her right hand, as well as peeling off tops of cans or opening jars.  On Thanksgiving, she noticed trouble multitasking, specifically preparing all the food for dinner.  She also felt a sensation that time was moving slowly.  After Thanksgiving, she had trouble using her 4th and 5th fingers on the right hand to play the piano, and she also had trouble maintaining stability of her right foot/ankle on the pedal.  She noted numbness involving the right hand and foot.  She began exhibiting dystonic posturing of the right hand as well as twitching of the right foot.  At one point, her husband noted that she had right facial droop and slurred speech.  She also reports that food doesn't taste good and therefore she has lost about 12 lbs since  Thanksgiving.  She denied fever.  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  MRI of cervical spine showed spondylosis but no compressive right sided lesion that would account for right sided weakness.  MRA of head performed 04/13/14 showed no major intracranial stenosis.  2D echo performed on 04/13/14 was unremarkable with a LVEF of 55-60% and no source of embolus.  Carotid duplex showed no hemodynamically significant ICA stenosis.  She was started on ASA 81mg  daily and she went to physical therapy.  Her strength has dramatically improved.  She occasionally has episode of head pressure but not headache.  However, she continues to have dysgeusia and twitching of the right foot at times, particularly when she is laying down.  She is able to suppress it when it occurs but not indefinitely.  In addition to difficulty with concentration and multitasking, she exhibited emotional lability where she would easily cry.  .  She had another MRI of the brain with and without contrast performed on 06/30/14 which appears unchanged except for possible increased T2 signal and small smudgy areas of enhancement within the left basal ganglia.  However the radiologist raised the possibility of an infiltrating glioma because of the absence of typical evolutionary changes of infarction.  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   Due to persistent difficulty with concentration and emotional lability, she had neuropsychological testing performed  on 06/30/14, which indicates that these symptoms are more likely related to anxiety and agitation rather than an organic etiology.  Of note, last spring, she began having right hip pain and difficulty walking due to right hip bursitis, which has improved with PT.  PAST MEDICAL HISTORY: Past Medical History  Diagnosis Date  . Stroke   . Chronic  dermatitis   . Hypertension   . Thrombotic stroke involving middle cerebral artery   . [redacted] week gestation of pregnancy     MEDICATIONS: Current Outpatient Prescriptions on File Prior to Visit  Medication Sig Dispense Refill  . clobetasol cream (TEMOVATE) 5.00 % Apply 1 application topically 2 (two) times daily.    . Clobetasol Propionate 0.05 % shampoo     . escitalopram (LEXAPRO) 5 MG tablet Take 1 tablet by mouth daily.    Marland Kitchen gabapentin (NEURONTIN) 300 MG capsule Take 300 mg by mouth daily.     . hydrochlorothiazide (HYDRODIURIL) 25 MG tablet Take 25 mg by mouth daily.    Marland Kitchen LORazepam (ATIVAN) 0.5 MG tablet Take 1 tablet by mouth daily.    . ramipril (ALTACE) 10 MG capsule Take 20 mg by mouth daily.     . tacrolimus (PROTOPIC) 0.1 % ointment Apply topically 2 (two) times daily.     No current facility-administered medications on file prior to visit.    ALLERGIES: Allergies  Allergen Reactions  . Amlodipine     FAMILY HISTORY: Family History  Problem Relation Age of Onset  . Stroke Mother   . Colon cancer Mother   . Heart attack Father   . Emphysema Father   . Thyroid disease Son   . Thyroid disease Maternal Grandmother   . Cancer Maternal Grandmother     cervical   . Thrombosis Maternal Grandfather     SOCIAL HISTORY: History   Social History  . Marital Status: Married    Spouse Name: [name]  . Number of Children: 2  . Years of Education: MAx2   Occupational History  . Retired Other   Social History Main Topics  . Smoking status: Never Smoker   . Smokeless tobacco: Never Used  . Alcohol Use: 0.0 oz/week    0 Standard drinks or equivalent per week     Comment: 4 glasses of wine weekly  . Drug Use: No  . Sexual Activity: No   Other Topics Concern  . Not on file   Social History Narrative   Patient lives at home with her spouse.   Caffeine Use: 2-3 cups daily    REVIEW OF SYSTEMS: Constitutional: No fevers, chills, or sweats, no generalized fatigue,  change in appetite Eyes: No visual changes, double vision, eye pain Ear, nose and throat: No hearing loss, ear pain, nasal congestion, sore throat Cardiovascular: No chest pain, palpitations Respiratory:  No shortness of breath at rest or with exertion, wheezes GastrointestinaI: No nausea, vomiting, diarrhea, abdominal pain, fecal incontinence Genitourinary:  No dysuria, urinary retention or frequency Musculoskeletal:  Ankle soreness and weakness. Integumentary: No rash, pruritus, skin lesions Neurological: as above Psychiatric: anxiety, frustration Endocrine: No palpitations, fatigue, diaphoresis, mood swings, change in appetite, change in weight, increased thirst Hematologic/Lymphatic:  No anemia, purpura, petechiae. Allergic/Immunologic: no itchy/runny eyes, nasal congestion, recent allergic reactions, rashes  PHYSICAL EXAM: Filed Vitals:   08/09/14 1345  BP: 110/70  Pulse: 82  Resp: 18   General: No acute distress Head:  Normocephalic/atraumatic Handwriting looks okay on my inspection  IMPRESSION: 1.  Probable thrombotic left MCA  territory stroke secondary to small vessel disease.  The fact that the lesion on MRI is so distinct within a specific vascular territory would correlate with stroke rather than a glioma.  2.  Adjustment disorder with anxiety  PLAN: 1.  She may benefit from another round of PT at [facility].  It would be important for her to understand what should be her goals.  I explained that she may have simply plateaued. 2.  Plan is to repeat MRI of brain with and without contrast in June with follow up soon after.  15 minutes spent with patient and her husband, 100% spent discussing diagnosis and explaining management and prognosis.  I answered all questions to the best of my ability.  Metta Clines, DO  CC:  [name], MD

## 2014-08-09 NOTE — Patient Instructions (Signed)
Refer to Lindsay Clark for PT at [facility] Follow up in June after MRI

## 2014-08-11 ENCOUNTER — Ambulatory Visit: Payer: Medicare HMO | Admitting: Diagnostic Neuroimaging

## 2014-08-31 DIAGNOSIS — L309 Dermatitis, unspecified: Secondary | ICD-10-CM | POA: Diagnosis not present

## 2014-08-31 DIAGNOSIS — D1801 Hemangioma of skin and subcutaneous tissue: Secondary | ICD-10-CM | POA: Diagnosis not present

## 2014-08-31 DIAGNOSIS — D225 Melanocytic nevi of trunk: Secondary | ICD-10-CM | POA: Diagnosis not present

## 2014-08-31 DIAGNOSIS — L570 Actinic keratosis: Secondary | ICD-10-CM | POA: Diagnosis not present

## 2014-08-31 DIAGNOSIS — L820 Inflamed seborrheic keratosis: Secondary | ICD-10-CM | POA: Diagnosis not present

## 2014-08-31 DIAGNOSIS — L821 Other seborrheic keratosis: Secondary | ICD-10-CM | POA: Diagnosis not present

## 2014-08-31 DIAGNOSIS — L814 Other melanin hyperpigmentation: Secondary | ICD-10-CM | POA: Diagnosis not present

## 2014-09-30 ENCOUNTER — Ambulatory Visit (HOSPITAL_COMMUNITY)
Admission: RE | Admit: 2014-09-30 | Discharge: 2014-09-30 | Disposition: A | Discharge status: Home / Self Care | Payer: Medicare PPO | Source: Ambulatory Visit | Attending: Diagnostic Neuroimaging | Admitting: Diagnostic Neuroimaging

## 2014-09-30 ENCOUNTER — Ambulatory Visit (HOSPITAL_COMMUNITY)
Admission: RE | Admit: 2014-09-30 | Discharge: 2014-09-30 | Disposition: A | Discharge status: Home / Self Care | Payer: Medicare PPO | Source: Ambulatory Visit | Attending: Neurology | Admitting: Neurology

## 2014-09-30 DIAGNOSIS — R930 Abnormal findings on diagnostic imaging of skull and head, not elsewhere classified: Secondary | ICD-10-CM | POA: Insufficient documentation

## 2014-09-30 DIAGNOSIS — M4802 Spinal stenosis, cervical region: Secondary | ICD-10-CM | POA: Diagnosis not present

## 2014-09-30 DIAGNOSIS — I63312 Cerebral infarction due to thrombosis of left middle cerebral artery: Secondary | ICD-10-CM

## 2014-09-30 DIAGNOSIS — R9409 Abnormal results of other function studies of central nervous system: Secondary | ICD-10-CM | POA: Diagnosis not present

## 2014-09-30 LAB — POCT I-STAT CREATININE: Creatinine, Ser: 0.7 mg/dL (ref 0.44–1.00)

## 2014-09-30 MED ORDER — GADOBENATE DIMEGLUMINE 529 MG/ML IV SOLN
17.0000 mL | Freq: Once | INTRAVENOUS | Status: AC | PRN
  Administered 2014-09-30: 17 mL via INTRAVENOUS

## 2014-10-01 ENCOUNTER — Telehealth: Payer: Self-pay | Admitting: Neurology

## 2014-10-01 NOTE — Telephone Encounter (Signed)
Pt called and wanted to know if her MRI results are ready/Dawn  CB# [phone])         [phone])

## 2014-10-01 NOTE — Telephone Encounter (Signed)
Patient requesting results of MRI please advise

## 2014-10-01 NOTE — Telephone Encounter (Signed)
The results were never sent to me.  I want to see her on Monday to discuss the results.

## 2014-10-04 ENCOUNTER — Encounter: Payer: Self-pay | Admitting: Neurology

## 2014-10-04 ENCOUNTER — Ambulatory Visit (INDEPENDENT_AMBULATORY_CARE_PROVIDER_SITE_OTHER): Payer: Medicare PPO | Admitting: Neurology

## 2014-10-04 VITALS — BP 130/76 | HR 78 | Resp 16 | Ht 64.8 in | Wt 189.6 lb

## 2014-10-04 DIAGNOSIS — R930 Abnormal findings on diagnostic imaging of skull and head, not elsewhere classified: Secondary | ICD-10-CM

## 2014-10-04 DIAGNOSIS — R9089 Other abnormal findings on diagnostic imaging of central nervous system: Secondary | ICD-10-CM | POA: Insufficient documentation

## 2014-10-04 NOTE — Telephone Encounter (Signed)
Pt has an appt today / Sherri S.

## 2014-10-04 NOTE — Patient Instructions (Signed)
1.  Next step will be to order a spinal tap.  Further testing pending results, such as referral to appropriate specialist 2.  Follow up in 4 to 6 weeks

## 2014-10-04 NOTE — Progress Notes (Signed)
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  Repeat MRI of the brain reviewed.  She is accompanied by her husband who provides some history.  UPDATE: She had a repeat MRI of the brain with and without contrast performed on 09/30/14.  The abnormality in the left basal ganglia was unchanged.  There is no abnormal enhancement or mass effect.  A tumor such as lymphoma or low grade glioma was suggested.  There is also a new 6 x 4 x 3 mm nodular area of enhancement within the left cerebellum which possibly may be spread of tumor.  Symptoms fluctuate in regards to gait and use of right arm and leg.  She has to actively think how to move her extremities on the right.  She reports difficulty getting out of a car.  Walking improves with distance.  Writing has gotten better.  She continues PT at [facility].  HISTORY: In October-early November, she began noticing trouble gripping the fork with her right hand, as well as peeling off tops of cans or opening jars.  On Thanksgiving, she noticed trouble multitasking, specifically preparing all the food for dinner.  She also felt a sensation that time was moving slowly.  After Thanksgiving, she had trouble using her 4th and 5th fingers on the right hand to play the piano, and she also had trouble maintaining stability of her right foot/ankle on the pedal.  She noted numbness involving the right hand and foot.  She began exhibiting dystonic posturing of the right hand as well as twitching of the right foot.  At one point, her husband noted that she had right facial droop and slurred speech.  She also reports that food doesn't taste good and therefore she has lost about 12 lbs since Thanksgiving.  She denied fever.  She had an MRI of the brain  with and without contrast performed on 04/05/14, which revealed late subacute left MCA territory infarct involving the left basal ganglia.  MRI of cervical spine showed spondylosis but no compressive right sided lesion that would account for right sided weakness.  MRA of head performed 04/13/14 showed no major intracranial stenosis.  2D echo performed on 04/13/14 was unremarkable with a LVEF of 55-60% and no source of embolus.  Carotid duplex showed no hemodynamically significant ICA stenosis.  She was started on ASA 81mg  daily and she went to physical therapy.  Her strength improved but then started to gradually progress again.  She occasionally has episode of head pressure but not headache.  However, she continues to have dysgeusia and twitching of the right foot at times, particularly when she is laying down.  She is able to suppress it when it occurs but not indefinitely.  In addition to difficulty with concentration and multitasking, she exhibited emotional lability where she would easily cry.  .  She had another MRI of the brain with and without contrast performed on 06/30/14 which appears unchanged except for possible increased T2 signal and small smudgy areas of enhancement within the left basal ganglia.  However the radiologist raised the possibility of an infiltrating glioma because of the absence of typical evolutionary changes of infarction.  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   Due to persistent difficulty with concentration and emotional lability, she had  neuropsychological testing performed on 06/30/14, which indicates that these symptoms are more likely related to anxiety and agitation rather than an organic etiology.  She is frustrated with not knowing completely what is going on as her gradual onset of symptoms are not typical presentation for stroke.  PAST MEDICAL HISTORY: Past Medical History  Diagnosis Date  . Stroke   . Chronic dermatitis   . Hypertension    . Thrombotic stroke involving middle cerebral artery   . [redacted] week gestation of pregnancy     MEDICATIONS: Current Outpatient Prescriptions on File Prior to Visit  Medication Sig Dispense Refill  . ALTACE 5 MG capsule     . clobetasol cream (TEMOVATE) 5.57 % Apply 1 application topically 2 (two) times daily.    . Clobetasol Propionate 0.05 % shampoo     . gabapentin (NEURONTIN) 300 MG capsule Take 300 mg by mouth daily.     . hydrochlorothiazide (HYDRODIURIL) 25 MG tablet Take 25 mg by mouth daily.    . ramipril (ALTACE) 10 MG capsule Take 20 mg by mouth daily.     . tacrolimus (PROTOPIC) 0.1 % ointment Apply topically 2 (two) times daily.     No current facility-administered medications on file prior to visit.    ALLERGIES: Allergies  Allergen Reactions  . Amlodipine     FAMILY HISTORY: Family History  Problem Relation Age of Onset  . Stroke Mother   . Colon cancer Mother   . Heart attack Father   . Emphysema Father   . Thyroid disease Son   . Thyroid disease Maternal Grandmother   . Cancer Maternal Grandmother     cervical   . Thrombosis Maternal Grandfather     SOCIAL HISTORY: History   Social History  . Marital Status: Married    Spouse Name: [name]  . Number of Children: 2  . Years of Education: MAx2   Occupational History  . Retired Other   Social History Main Topics  . Smoking status: Never Smoker   . Smokeless tobacco: Never Used  . Alcohol Use: 0.0 oz/week    0 Standard drinks or equivalent per week     Comment: 4 glasses of wine weekly  . Drug Use: No  . Sexual Activity: No   Other Topics Concern  . Not on file   Social History Narrative   Patient lives at home with her spouse.   Caffeine Use: 2-3 cups daily    REVIEW OF SYSTEMS: Constitutional: No fevers, chills, or sweats, no generalized fatigue, change in appetite Eyes: No visual changes, double vision, eye pain Ear, nose and throat: No hearing loss, ear pain, nasal congestion, sore  throat Cardiovascular: No chest pain, palpitations Respiratory:  No shortness of breath at rest or with exertion, wheezes GastrointestinaI: No nausea, vomiting, diarrhea, abdominal pain, fecal incontinence Genitourinary:  No dysuria, urinary retention or frequency Musculoskeletal:  No neck pain, back pain Integumentary: No rash, pruritus, skin lesions Neurological: as above Psychiatric: No depression, insomnia, anxiety Endocrine: No palpitations, fatigue, diaphoresis, mood swings, change in appetite, change in weight, increased thirst Hematologic/Lymphatic:  No anemia, purpura, petechiae. Allergic/Immunologic: no itchy/runny eyes, nasal congestion, recent allergic reactions, rashes  PHYSICAL EXAM: Filed Vitals:   10/04/14 0958  BP: 130/76  Pulse: 78  Resp: 16   General: No acute distress Head:  Normocephalic/atraumatic Eyes:  Fundoscopic exam unremarkable without vessel changes, exudates, hemorrhages or papilledema. Neck: supple, no paraspinal tenderness, full range of motion Heart:  Regular rate and rhythm Lungs:  Clear to auscultation bilaterally Back: No paraspinal tenderness Neurological Exam: alert and oriented to person, place, and time. Attention span and concentration intact, recent and remote memory intact, fund of knowledge intact.  Speech fluent and not dysarthric, language intact.  CN II-XII intact. Fundoscopic exam unremarkable without vessel changes, exudates, hemorrhages or papilledema.  Bulk and tone normal, muscle strength 5-/5 right hip flexion and ankle dorsiflexion.  Otherwise, 5/5.  Mildly reduced finger-thumb tapping and amplitude on right.  Mildly reduced pinprick sensation of the right foot.  Vibration sensation intact.  Deep tendon reflexes slightly brisk in right patellar, otherwise 2+ throughout, toes downgoing.  Finger to nose and heel to shin testing intact.  There seems to be incoordination involving the right foot, unable to tandem walk, Romberg  negative.  IMPRESSION: 1.  Previous MRI of brain showed possible left basal ganglia infarct.  Given gradual progression of symptoms, repeat MRI of brain was repeated, with findings now more suggestive of malignancy, however it is not clear.  I did discuss results with one of the neuroradiologists.  Unusual presentation for lymphoma, as it does not enhance and there is no mass effect.  A low-grade glioma is still possible.  There is another small focus of enhancement in the cerebellum, not previously seen on MRI.  There is nothing on diffusion-weighted imaging however.  PLAN: Schedule for lumbar puncture, testing for cell count with differential, cytology, flow cytometry, protein, glucose, gram stain and culture, fungal culture, IgG index, oligoclonal bands, ACE. Further management, such as referral to appropriate specialist (such as oncology), pending results  30 minutes spent with patient and husband, over 50% spent discussing MRI results, possible diagnoses and management of care.  Metta Clines, DO  CC:  [name], MD

## 2014-10-05 ENCOUNTER — Ambulatory Visit (HOSPITAL_COMMUNITY): Admission: RE | Admit: 2014-10-05 | Payer: Medicare PPO | Source: Ambulatory Visit

## 2014-10-06 DIAGNOSIS — L309 Dermatitis, unspecified: Secondary | ICD-10-CM | POA: Diagnosis not present

## 2014-10-07 ENCOUNTER — Telehealth: Payer: Self-pay | Admitting: *Deleted

## 2014-10-07 ENCOUNTER — Ambulatory Visit: Payer: Medicare PPO | Admitting: Neurology

## 2014-10-07 NOTE — Telephone Encounter (Signed)
I spoke with Lindsay Clark everything is ok with the labs for CSF

## 2014-10-07 NOTE — Telephone Encounter (Signed)
[phone] 

## 2014-10-07 NOTE — Telephone Encounter (Signed)
Please call Andee Poles [facility] in reference to an order you just faxed over

## 2014-10-08 ENCOUNTER — Ambulatory Visit
Admission: RE | Admit: 2014-10-08 | Discharge: 2014-10-08 | Disposition: A | Discharge status: Home / Self Care | Payer: Medicare PPO | Source: Ambulatory Visit | Attending: Neurology | Admitting: Neurology

## 2014-10-08 ENCOUNTER — Other Ambulatory Visit: Payer: Self-pay | Admitting: Neurology

## 2014-10-08 ENCOUNTER — Other Ambulatory Visit (HOSPITAL_COMMUNITY)
Admission: RE | Admit: 2014-10-08 | Discharge: 2014-10-08 | Disposition: A | Discharge status: Home / Self Care | Payer: Medicare PPO | Source: Ambulatory Visit | Attending: Neurology | Admitting: Neurology

## 2014-10-08 DIAGNOSIS — R9089 Other abnormal findings on diagnostic imaging of central nervous system: Secondary | ICD-10-CM

## 2014-10-08 DIAGNOSIS — G939 Disorder of brain, unspecified: Secondary | ICD-10-CM | POA: Diagnosis not present

## 2014-10-08 DIAGNOSIS — I63312 Cerebral infarction due to thrombosis of left middle cerebral artery: Secondary | ICD-10-CM | POA: Diagnosis not present

## 2014-10-08 DIAGNOSIS — R9082 White matter disease, unspecified: Secondary | ICD-10-CM | POA: Diagnosis not present

## 2014-10-08 DIAGNOSIS — R930 Abnormal findings on diagnostic imaging of skull and head, not elsewhere classified: Secondary | ICD-10-CM | POA: Insufficient documentation

## 2014-10-08 DIAGNOSIS — G319 Degenerative disease of nervous system, unspecified: Secondary | ICD-10-CM | POA: Diagnosis not present

## 2014-10-08 DIAGNOSIS — M4802 Spinal stenosis, cervical region: Secondary | ICD-10-CM | POA: Diagnosis not present

## 2014-10-08 LAB — CSF CELL COUNT WITH DIFFERENTIAL
RBC Count, CSF: 0 cu mm
Tube #: 3
WBC, CSF: 1 cu mm (ref 0–5)

## 2014-10-08 LAB — PROTEIN, CSF: TOTAL PROTEIN, CSF: 24 mg/dL (ref 15–45)

## 2014-10-08 LAB — GLUCOSE, CSF: Glucose, CSF: 57 mg/dL (ref 43–76)

## 2014-10-08 NOTE — Discharge Instructions (Signed)

## 2014-10-08 NOTE — Progress Notes (Signed)
One SST tube of blood drawn from right AC space for LP labs; site unremarkable.  Jeanne Lohr, RN 

## 2014-10-09 LAB — IGG,CSF: IGG, CSF: 1 mg/dL (ref 0.8–7.7)

## 2014-10-11 DIAGNOSIS — L309 Dermatitis, unspecified: Secondary | ICD-10-CM | POA: Diagnosis not present

## 2014-10-11 LAB — CSF CULTURE: ORGANISM ID, BACTERIA: NO GROWTH

## 2014-10-11 LAB — ANGIOTENSIN CONVERTING ENZYME, CSF: ACE, CSF: 6 U/L (ref <=15)

## 2014-10-11 LAB — CSF CULTURE W GRAM STAIN
Gram Stain: NONE SEEN
Gram Stain: NONE SEEN

## 2014-10-12 ENCOUNTER — Telehealth: Payer: Self-pay | Admitting: Neurology

## 2014-10-12 NOTE — Telephone Encounter (Signed)
Patient is aware that labs are not finalized and as soon as they are we will call her with results as making an appt for follow up

## 2014-10-12 NOTE — Telephone Encounter (Signed)
Pt has some questions  1 When should we have the results for the spinal tap? 2 the fluid was sent to 2 different labs , was it to test for the same thing or two different things? 3 How soon does she need come back in to see Dr Tomi Likens after we get the results and when can she come back in  Please call [phone] or [phone]

## 2014-10-13 LAB — LEUKEMIA/LYMPHOMA EVALUATION PANEL

## 2014-10-13 LAB — ANAEROBIC CULTURE
Gram Stain: NONE SEEN
Gram Stain: NONE SEEN

## 2014-10-13 LAB — OLIGOCLONAL BANDS, CSF + SERM

## 2014-10-14 ENCOUNTER — Telehealth: Payer: Self-pay | Admitting: Neurology

## 2014-10-14 DIAGNOSIS — D496 Neoplasm of unspecified behavior of brain: Secondary | ICD-10-CM | POA: Diagnosis not present

## 2014-10-14 DIAGNOSIS — M6281 Muscle weakness (generalized): Secondary | ICD-10-CM | POA: Diagnosis not present

## 2014-10-14 NOTE — Telephone Encounter (Signed)
Patient is aware of labs and appt on 10/20/14

## 2014-10-14 NOTE — Telephone Encounter (Signed)
I received the following labs back from the CSF:  Cell count 1, cytology negative for malignant cells, glucose 57, protein 24, ACE 6, gram stain and culture negative,  IgG 1 and oligoclonal bands 2.   At this point, it does not look like lymphoma.  Low-grade glioma is still possible.  We will refer Lindsay Clark to neurosurgery for evaluation and possible biopsy.

## 2014-10-18 ENCOUNTER — Telehealth: Payer: Self-pay | Admitting: *Deleted

## 2014-10-18 ENCOUNTER — Telehealth: Payer: Self-pay | Admitting: Hematology and Oncology

## 2014-10-18 ENCOUNTER — Telehealth: Payer: Self-pay | Admitting: Neurology

## 2014-10-18 NOTE — Telephone Encounter (Signed)
I spoke with Lindsay Clark and her husband.  We went over the CSF results, including cytology, which revealed atypical cells.  She was supposed to see Dr. Kathyrn Sheriff, however she would prefer seeing Dr. Sherwood Gambler or Dr. Vertell Limber.  I contacted Dr. Donnella Bi office.  He won't be in the office until the afternoon.  I asked if he will be able to squeeze her in this week.  I left my cell number for him to call me back.  I also discussed with Dr. Alvy Bimler of oncology.  We will schedule Lindsay Clark to see Dr. Alvy Bimler as well.

## 2014-10-18 NOTE — Telephone Encounter (Signed)
See below note this patient would like for you to call her .

## 2014-10-18 NOTE — Telephone Encounter (Signed)
Pt called and said she has not heard anything from [facility] and would like a call back/Dawn CB#[phone]

## 2014-10-18 NOTE — Telephone Encounter (Signed)
Patient would like to speak with Dr Tomi Likens about some lab work that was not done she was very upset and states we need to stop dragging our feet about this issue Call back number [phone]

## 2014-10-18 NOTE — Telephone Encounter (Signed)
new patient appt-s/w patient and gave np appt for 06/29 @ 12 w/Dr. Alvy Bimler

## 2014-10-19 NOTE — Telephone Encounter (Signed)
Patient is aware that she has appointment on Friday as well as oncology appointment on 10/20/14

## 2014-10-20 ENCOUNTER — Ambulatory Visit: Payer: Medicare PPO

## 2014-10-20 ENCOUNTER — Encounter: Payer: Self-pay | Admitting: Hematology and Oncology

## 2014-10-20 ENCOUNTER — Other Ambulatory Visit: Payer: Self-pay | Admitting: Medical Oncology

## 2014-10-20 ENCOUNTER — Ambulatory Visit (HOSPITAL_BASED_OUTPATIENT_CLINIC_OR_DEPARTMENT_OTHER): Payer: Medicare PPO

## 2014-10-20 ENCOUNTER — Other Ambulatory Visit (HOSPITAL_COMMUNITY)
Admission: RE | Admit: 2014-10-20 | Discharge: 2014-10-20 | Disposition: A | Discharge status: Home / Self Care | Payer: Medicare PPO | Source: Ambulatory Visit | Attending: Hematology and Oncology | Admitting: Hematology and Oncology

## 2014-10-20 ENCOUNTER — Telehealth: Payer: Self-pay | Admitting: Hematology and Oncology

## 2014-10-20 ENCOUNTER — Ambulatory Visit (HOSPITAL_BASED_OUTPATIENT_CLINIC_OR_DEPARTMENT_OTHER): Payer: Medicare PPO | Admitting: Hematology and Oncology

## 2014-10-20 VITALS — BP 122/66 | HR 79 | Temp 98.1°F | Resp 18 | Ht 64.8 in | Wt 189.3 lb

## 2014-10-20 DIAGNOSIS — R930 Abnormal findings on diagnostic imaging of skull and head, not elsewhere classified: Secondary | ICD-10-CM

## 2014-10-20 DIAGNOSIS — C8590 Non-Hodgkin lymphoma, unspecified, unspecified site: Secondary | ICD-10-CM | POA: Insufficient documentation

## 2014-10-20 DIAGNOSIS — R9089 Other abnormal findings on diagnostic imaging of central nervous system: Secondary | ICD-10-CM

## 2014-10-20 DIAGNOSIS — D7289 Other specified disorders of white blood cells: Secondary | ICD-10-CM | POA: Diagnosis not present

## 2014-10-20 DIAGNOSIS — I639 Cerebral infarction, unspecified: Secondary | ICD-10-CM

## 2014-10-20 LAB — CBC WITH DIFFERENTIAL/PLATELET
BASO%: 0.4 % (ref 0.0–2.0)
BASOS ABS: 0 10*3/uL (ref 0.0–0.1)
EOS ABS: 0.2 10*3/uL (ref 0.0–0.5)
EOS%: 3.1 % (ref 0.0–7.0)
HEMATOCRIT: 40.8 % (ref 34.8–46.6)
HEMOGLOBIN: 13.9 g/dL (ref 11.6–15.9)
LYMPH%: 24.8 % (ref 14.0–49.7)
MCH: 30.6 pg (ref 25.1–34.0)
MCHC: 34.1 g/dL (ref 31.5–36.0)
MCV: 89.9 fL (ref 79.5–101.0)
MONO#: 0.4 10*3/uL (ref 0.1–0.9)
MONO%: 4.9 % (ref 0.0–14.0)
NEUT%: 66.8 % (ref 38.4–76.8)
NEUTROS ABS: 5.2 10*3/uL (ref 1.5–6.5)
Platelets: 217 10*3/uL (ref 145–400)
RBC: 4.54 10*6/uL (ref 3.70–5.45)
RDW: 13.5 % (ref 11.2–14.5)
WBC: 7.8 10*3/uL (ref 3.9–10.3)
lymph#: 1.9 10*3/uL (ref 0.9–3.3)

## 2014-10-20 LAB — COMPREHENSIVE METABOLIC PANEL (CC13)
ALT: 23 U/L (ref 0–55)
AST: 21 U/L (ref 5–34)
Albumin: 3.8 g/dL (ref 3.5–5.0)
Alkaline Phosphatase: 83 U/L (ref 40–150)
Anion Gap: 10 mEq/L (ref 3–11)
BUN: 15.1 mg/dL (ref 7.0–26.0)
CALCIUM: 9.1 mg/dL (ref 8.4–10.4)
CHLORIDE: 100 meq/L (ref 98–109)
CO2: 29 meq/L (ref 22–29)
Creatinine: 0.8 mg/dL (ref 0.6–1.1)
EGFR: 78 mL/min/{1.73_m2} — AB (ref >90)
GLUCOSE: 143 mg/dL — AB (ref 70–140)
Potassium: 3.4 mEq/L — ABNORMAL LOW (ref 3.5–5.1)
Sodium: 140 mEq/L (ref 136–145)
TOTAL PROTEIN: 6.6 g/dL (ref 6.4–8.3)
Total Bilirubin: 0.39 mg/dL (ref 0.20–1.20)

## 2014-10-20 LAB — LACTATE DEHYDROGENASE (CC13): LDH: 168 U/L (ref 125–245)

## 2014-10-20 NOTE — Telephone Encounter (Signed)
Gave and porinted appt sched and avs for pt for jULY

## 2014-10-20 NOTE — Progress Notes (Signed)
I checked in new patient with no issues prior to seeing the dr °

## 2014-10-21 LAB — LUPUS ANTICOAGULANT PANEL
DRVVT: 35 secs (ref <42.9)
Lupus Anticoagulant: NOT DETECTED
PTT Lupus Anticoagulant: 41.3 secs (ref 28.0–43.0)

## 2014-10-21 LAB — D-DIMER, QUANTITATIVE (NOT AT ARMC): D DIMER QUANT: 0.45 ug{FEU}/mL (ref 0.00–0.48)

## 2014-10-22 ENCOUNTER — Telehealth: Payer: Self-pay | Admitting: *Deleted

## 2014-10-22 DIAGNOSIS — D496 Neoplasm of unspecified behavior of brain: Secondary | ICD-10-CM | POA: Diagnosis not present

## 2014-10-22 DIAGNOSIS — G8191 Hemiplegia, unspecified affecting right dominant side: Secondary | ICD-10-CM | POA: Diagnosis not present

## 2014-10-22 DIAGNOSIS — Z6831 Body mass index (BMI) 31.0-31.9, adult: Secondary | ICD-10-CM | POA: Diagnosis not present

## 2014-10-22 DIAGNOSIS — I10 Essential (primary) hypertension: Secondary | ICD-10-CM | POA: Diagnosis not present

## 2014-10-22 DIAGNOSIS — M549 Dorsalgia, unspecified: Secondary | ICD-10-CM | POA: Diagnosis not present

## 2014-10-22 NOTE — Telephone Encounter (Signed)
Pt requested copy of labwork that is done to pick up today and take w/ her to Dr. Sherwood Gambler this afternoon.   Labs left for pt to pick up.  Informed her they are not all back yet. She verbalized understanding.  Labs left out front lobby for pt to pick up.

## 2014-10-22 NOTE — Assessment & Plan Note (Signed)
I am also wondering her neurological deficit with the abnormality seen on the MRI could be sign of atypical stroke that would not fit into a particular vascular distribution. I will order lupus anti-coagulant panel to exclude potential lupus anticoagulation as a cause of her neurological deficit.

## 2014-10-22 NOTE — Progress Notes (Signed)
[place] CONSULT NOTE  Patient Care Team: [name], MD as PCP - General (Family Medicine)  CHIEF COMPLAINTS/PURPOSE OF CONSULTATION:  Abnormal imaging study, suspicious cells on recent diagnostic lumbar puncture, exclude CNS lymphoma  HISTORY OF PRESENTING ILLNESS:  Lindsay Clark 74 y.o. female is here because of neurological deficit that started around November 2015. Around 02/12/2014, she had a recorded public speech that was described as "deliberative". Around November 2015, she had sciatica type pain radiating down to the right leg.  Around Thanksgiving, she have some mild slurring of speech and difficult perception with sense of time and difficulties organizing a bridge club at home. 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 MRI imaging study of the cervical spine in December 2015 was unremarkable from degenerative joint disease. Ultimately, she had imaging study of the brain which show abnormalities suspicious for stroke. She was started on aspirin and subsequently underwent extensive evaluation including MRA, echocardiogram and carotid Doppler which were unremarkable. She was referred to physical therapist and her symptoms continued to improve. At present time, she have gait imbalance with tendency to fall towards the right. She denies recent falls. At rest, her right foot tends to be inverted and that causes significant cramps. She have diffuse skin itching for some time with associated rash. She complained of change in taste and have unintentional 13 pound weight loss over the past few months. She also have increased sensitivities to sound. Her head is described as "heavy" with pressure but she denies any headaches. She denies any changes in her vision. On 10/08/2014, lumbar puncture showed no evidence of WBC, gram stains & cultures were negative. CSF protein and glucose & angiotensin-converting enzyme were within normal  limits. MEDICAL HISTORY:  Past Medical History  Diagnosis Date  . Stroke   . Chronic dermatitis   . Hypertension   . Thrombotic stroke involving middle cerebral artery   . [redacted] week gestation of pregnancy   . 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 Tubal ligation  1980    SOCIAL HISTORY: History   Social History  . Marital Status: Married    Spouse Name: [name]  . Number of Children: 2  . Years of Education: MAx2   Occupational History  . Retired Other   Social History Main Topics  . Smoking status: Never Smoker   . Smokeless tobacco: Never Used  . Alcohol Use: 0.0 oz/week    0 Standard drinks or equivalent per week     Comment: 4 glasses of wine weekly  . Drug Use: No  . Sexual Activity: No   Other Topics Concern  . Not on file   Social History Narrative   Patient lives at home with her spouse.   Caffeine Use: 2-3 cups daily    FAMILY HISTORY: Family History  Problem Relation Age of Onset  . Stroke Mother   . Colon cancer Mother   . Heart attack Father   . Emphysema Father   . Thyroid disease Son   . Thyroid disease Maternal Grandmother   . Cancer Maternal Grandmother     cervical   . Thrombosis Maternal Grandfather     ALLERGIES:  is allergic to amlodipine.  MEDICATIONS:  Current Outpatient Prescriptions  Medication Sig Dispense Refill  . ALTACE 5 MG capsule Take 20 mg by mouth daily.     Marland Kitchen  clobetasol ointment (TEMOVATE) 2.99 % Apply 1 application topically 2 (two) times daily as needed.    . Clobetasol Propionate 0.05 % shampoo Apply topically daily as needed.     . fluocinonide cream (LIDEX) 2.42 % Apply 1 application topically daily as needed.     . fluticasone (FLONASE) 50 MCG/ACT nasal spray Place 1 spray into both nostrils as needed for allergies or rhinitis.    Marland Kitchen gabapentin (NEURONTIN) 300 MG capsule Take 300 mg by mouth daily.     .  hydrochlorothiazide (HYDRODIURIL) 25 MG tablet Take 25 mg by mouth daily.    . Multiple Vitamins-Minerals (ONE-A-DAY WOMENS 50 PLUS PO) Take by mouth daily.    . Omega-3 Fatty Acids (FISH OIL) 1000 MG CAPS Take 2 capsules by mouth daily.    . tacrolimus (PROTOPIC) 0.1 % ointment Apply topically 2 (two) times daily.     No current facility-administered medications for this visit.    REVIEW OF SYSTEMS:   Constitutional: Denies fevers, chills or abnormal night sweats Eyes: Denies blurriness of vision, double vision or watery eyes Ears, nose, mouth, throat, and face: Denies mucositis or sore throat Respiratory: Denies cough, dyspnea or wheezes Cardiovascular: Denies palpitation, chest discomfort or lower extremity swelling Gastrointestinal:  Denies nausea, heartburn or change in bowel habits Skin: Denies abnormal skin rashes Lymphatics: Denies new lymphadenopathy or easy bruising Behavioral/Psych: Mood is stable, no new changes  All other systems were reviewed with the patient and are negative.  PHYSICAL EXAMINATION: ECOG PERFORMANCE STATUS: 1 - Symptomatic but completely ambulatory  Filed Vitals:   10/20/14 1227  BP: 122/66  Pulse: 79  Temp: 98.1 F (36.7 C)  Resp: 18   Filed Weights   10/20/14 1227  Weight: 189 lb 4.8 oz (85.866 kg)    GENERAL:alert, no distress and comfortable SKIN: skin color, texture, turgor are normal, no rashes or significant lesions EYES: normal, conjunctiva are pink and non-injected, sclera clear OROPHARYNX:no exudate, no erythema and lips, buccal mucosa, and tongue normal  NECK: supple, thyroid normal size, non-tender, without nodularity LYMPH:  no palpable lymphadenopathy in the cervical, axillary or inguinal LUNGS: clear to auscultation and percussion with normal breathing effort HEART: regular rate & rhythm and no murmurs and no lower extremity edema ABDOMEN:abdomen soft, non-tender and normal bowel sounds Musculoskeletal:no cyanosis of digits  and no clubbing  PSYCH: alert & oriented x 3 with fluent speech NEURO: She has preserved strength. She has inverted right foot at rest.  LABORATORY DATA:  I have reviewed the data as listed Lab Results  Component Value Date   WBC 7.8 10/20/2014   HGB 13.9 10/20/2014   HCT 40.8 10/20/2014   MCV 89.9 10/20/2014   PLT 217 10/20/2014    Recent Labs  09/30/14 1413 10/20/14 1517  NA  --  140  K  --  3.4*  CO2  --  29  GLUCOSE  --  143*  BUN  --  15.1  CREATININE 0.70 0.8  CALCIUM  --  9.1  PROT  --  6.6  ALBUMIN  --  3.8  AST  --  21  ALT  --  23  ALKPHOS  --  83  BILITOT  --  0.39    RADIOGRAPHIC STUDIES: I have reveal multiple imaging study with the patient and her husband I have personally reviewed the radiological images as listed and agreed with the findings in the report. Mr Jeri Cos Wo Contrast  09/30/2014   CLINICAL DATA:  74 year old hypertensive female  with abnormal brain MR. Follow-up.  EXAM: MRI HEAD WITHOUT AND WITH CONTRAST  TECHNIQUE: Multiplanar, multiecho pulse sequences of the brain and surrounding structures were obtained without and with intravenous contrast.  CONTRAST:  17 cc MultiHance.  COMPARISON:  06/30/2014 and 04/13/2014.  FINDINGS: The abnormality involving the left lenticular nucleus, left external capsule, portion of the anterior limb of the left internal capsule and the left caudate has not changed since December 2015. This is felt unlikely to represent result of subacute infarct. This may represent tumor such as lymphoma or low grade glioma. Result of demyelinating process, infectious process (progressive multifocal leukoencephalopathy, tuberculosis), vasculitis or inflammatory process (sarcoidosis, Bechet's disease) are felt to be less likely considerations.  Additionally, patient has developed a 6 x 4 x 3 mm nodular area of enhancement within the left cerebellum and therefore spread of tumor is a possibility.  No significant change in the incidentally  noted right frontal developmental venous anomaly.  No acute thrombotic infarct.  No intracranial hemorrhage.  No hydrocephalus.  Moderate punctate and patchy white matter type changes may reflect result of small vessel disease.  Small right vertebral artery ends in a posterior inferior cerebellar artery distribution. Major intracranial vascular structures are patent.  Mild narrowing upper cervical spine C3-4 and C4-5 level. Cervical medullary junction, pituitary region and pineal region unremarkable as are the orbital structures.  Minimal mucosal thickening left frontal sinus and ethmoid sinus air cells bilaterally.  IMPRESSION: Abnormality involving the left lenticular nucleus, left external capsule, portion of the anterior limb of the left internal capsule and the left caudate has not changed since December 2015. This is felt unlikely to represent result of subacute infarct. This may represent tumor such as lymphoma or low grade glioma. Result of demyelinating process, infectious process (progressive multifocal leukoencephalopathy, tuberculosis), vasculitis or inflammatory process (sarcoidosis, Bechet's disease) are felt to be less likely considerations.  Additionally, patient has developed a 6 x 4 x 3 mm nodular area of enhancement within the left cerebellum and therefore spread of tumor is a possibility. Spread of inflammatory process or less likely infectious process also not excluded. Laboratory analysis of cerebral spinal fluid may prove helpful for further delineation.  No significant change in the incidentally noted right frontal developmental venous anomaly.  Moderate punctate and patchy white matter type changes may reflect result of small vessel disease.  Small right vertebral artery ends in a posterior inferior cerebellar artery distribution.  Mild narrowing upper cervical spine C3-4 and C4-5 level.   Electronically Signed   By: Genia Del M.D.   On: 09/30/2014 16:20   Dg Fluor Guide Ndl  Place/inj/lp  10/08/2014   CLINICAL DATA:  Abnormal finding on brain MRI.  EXAM: DIAGNOSTIC LUMBAR PUNCTURE UNDER FLUOROSCOPIC GUIDANCE  FLUOROSCOPY TIME:  Radiation Exposure Index (as provided by the fluoroscopic device): 86.03 microGray*m2  PROCEDURE: Informed consent was obtained from the patient prior to the procedure, including potential complications of headache, allergy, and pain. With the patient prone, the lower back was prepped with Betadine. 1% Lidocaine was used for local anesthesia. Lumbar puncture was performed at the L2-3 level using a 3.5 inch, 22 gauge needle via a left paramedian approach with return of clear CSF with an opening pressure of 17 cm water (measured in the left lateral decubitus position). 14 ml of CSF were obtained for laboratory studies. The patient tolerated the procedure well and there were no apparent complications.  IMPRESSION: Successful fluoroscopic guided lumbar puncture.   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 It is in a difficult location for biopsy. Ultimately, she may require repeat lumbar puncture in the future. I recommend blood work and staging PET CT scan to exclude systemic disease.  Abnormal finding on MRI of brain I am also wondering her neurological deficit with the abnormality seen on the MRI could be sign of atypical stroke that would not fit into a particular vascular distribution. I will order lupus anti-coagulant panel to exclude potential lupus anticoagulation as a cause of her neurological deficit.     All questions were answered. The patient knows to call the clinic with any problems, questions or concerns. I spent 55 minutes counseling the patient face to face. The total time spent in the appointment was 60 minutes and more than 50% was on counseling.     [place], [place], MD 10/22/2014 9:00 AM

## 2014-10-22 NOTE — Telephone Encounter (Signed)
I have faxed all records that patient has requested for both her oncology and neuro surgery appointments

## 2014-10-22 NOTE — Assessment & Plan Note (Signed)
The abnormality seen could be a sign of CNS lymphoma although I cannot be sure. It is in a difficult location for biopsy. Ultimately, she may require repeat lumbar puncture in the future. I recommend blood work and staging PET CT scan to exclude systemic disease.

## 2014-10-26 ENCOUNTER — Telehealth: Payer: Self-pay | Admitting: Hematology and Oncology

## 2014-10-26 NOTE — Telephone Encounter (Signed)
returned call and gv pt new d.t. of MD visit...pt ok and aware

## 2014-10-27 LAB — FLOW CYTOMETRY

## 2014-10-28 ENCOUNTER — Telehealth: Payer: Self-pay | Admitting: Hematology and Oncology

## 2014-10-28 NOTE — Telephone Encounter (Signed)
Gave patient copy of office note/pathology report

## 2014-10-29 ENCOUNTER — Ambulatory Visit (HOSPITAL_COMMUNITY)
Admission: RE | Admit: 2014-10-29 | Discharge: 2014-10-29 | Disposition: A | Discharge status: Home / Self Care | Payer: Medicare PPO | Source: Ambulatory Visit | Attending: Hematology and Oncology | Admitting: Hematology and Oncology

## 2014-10-29 ENCOUNTER — Ambulatory Visit: Payer: Medicare HMO | Admitting: Diagnostic Neuroimaging

## 2014-10-29 DIAGNOSIS — R9089 Other abnormal findings on diagnostic imaging of central nervous system: Secondary | ICD-10-CM

## 2014-10-29 DIAGNOSIS — C8590 Non-Hodgkin lymphoma, unspecified, unspecified site: Secondary | ICD-10-CM

## 2014-10-29 DIAGNOSIS — I639 Cerebral infarction, unspecified: Secondary | ICD-10-CM

## 2014-10-29 LAB — GLUCOSE, CAPILLARY: Glucose-Capillary: 89 mg/dL (ref 65–99)

## 2014-10-29 MED ORDER — FLUDEOXYGLUCOSE F - 18 (FDG) INJECTION
9.3500 | Freq: Once | INTRAVENOUS | Status: AC | PRN
  Administered 2014-10-29: 9.35 via INTRAVENOUS

## 2014-11-01 ENCOUNTER — Telehealth: Payer: Self-pay | Admitting: *Deleted

## 2014-11-01 NOTE — Telephone Encounter (Signed)
Received lengthy VM from pt this morning asking a lot of questions, but mainly she wanted to know the reason for the MR Spine on Wednesday?  She also wanted it in writing that her insurance would pay for this test before she gets it done.  She also had questions about getting her lab results from [facility] said if she did not get a return call by 10:30 am then she would be coming up to our Clinic in person to speak w/ someone.   Informed Dr. Alvy Bimler of pt's concerns and VM ..  Dr. Alvy Bimler recommends pt cancel the MR for this week and they can discuss on office visit on Thursday this week.  Test can be r/s after that if pt wants.   I called pt back and listened to multiple concerns and complaints for over 20 minutes.  Informed pt of Dr. Calton Dach recommendation to cancel MR for this week until she can discuss it with her in person on Thursday's visit as scheduled.  In the end, pt said she will keep the appt for the MR.  I gave her the number to call Radiology dept regarding her concern about billing and insurance coverage.  I also instructed pt to call them directly if she does decide to cancel her test this week.   Spent a lot of time trying to re-direct pt to discuss all her concerns and questions w/ Dr. Alvy Bimler on her next visit.   Pt asked for copy of "flow cytometry" report is not viewable in MyChart.  Printed out and left copy of this test out front for pt to pick up.   Also attempted to give pt the number for one of our Administrators since she voiced a lot of concerns and frustrations with a lot of things regarding her care in general.   Pt declined states she does not want to speak w/ anyone else.  She plans to keep appts as scheduled for now and will come by to pick up the copy of flow cytometry report.  Informed pt I cannot interpret results for her she can discuss w/ Dr. Alvy Bimler on next visit.  Pt states understanding and says she wanted her all test results before her MD appointment so she could  come "prepared."

## 2014-11-02 ENCOUNTER — Ambulatory Visit: Payer: Medicare PPO | Admitting: Hematology and Oncology

## 2014-11-02 ENCOUNTER — Encounter: Payer: Self-pay | Admitting: Hematology and Oncology

## 2014-11-02 NOTE — Progress Notes (Unsigned)
11/02/2014  Note Correction The three authorizations for this patient have been approved and copies made for the patient. I spoke with her and she voiced understanding.

## 2014-11-02 NOTE — Progress Notes (Deleted)
11/02/2014 I spoke with this patient and informed her the copies of her three mri request and approval are at the front desk ready for pickup. Ms. Lindsay Clark voiced understanding.  Lindfa

## 2014-11-03 ENCOUNTER — Ambulatory Visit (HOSPITAL_COMMUNITY)
Admission: RE | Admit: 2014-11-03 | Discharge: 2014-11-03 | Disposition: A | Discharge status: Home / Self Care | Payer: Medicare PPO | Source: Ambulatory Visit | Attending: Hematology and Oncology | Admitting: Hematology and Oncology

## 2014-11-03 DIAGNOSIS — M4312 Spondylolisthesis, cervical region: Secondary | ICD-10-CM | POA: Diagnosis not present

## 2014-11-03 DIAGNOSIS — C8590 Non-Hodgkin lymphoma, unspecified, unspecified site: Secondary | ICD-10-CM

## 2014-11-03 DIAGNOSIS — M4806 Spinal stenosis, lumbar region: Secondary | ICD-10-CM | POA: Insufficient documentation

## 2014-11-03 DIAGNOSIS — M5030 Other cervical disc degeneration, unspecified cervical region: Secondary | ICD-10-CM | POA: Diagnosis not present

## 2014-11-03 DIAGNOSIS — M4314 Spondylolisthesis, thoracic region: Secondary | ICD-10-CM | POA: Diagnosis not present

## 2014-11-03 DIAGNOSIS — M47812 Spondylosis without myelopathy or radiculopathy, cervical region: Secondary | ICD-10-CM | POA: Diagnosis not present

## 2014-11-03 DIAGNOSIS — M5137 Other intervertebral disc degeneration, lumbosacral region: Secondary | ICD-10-CM | POA: Diagnosis not present

## 2014-11-03 DIAGNOSIS — M5136 Other intervertebral disc degeneration, lumbar region: Secondary | ICD-10-CM | POA: Diagnosis not present

## 2014-11-03 DIAGNOSIS — I639 Cerebral infarction, unspecified: Secondary | ICD-10-CM

## 2014-11-03 DIAGNOSIS — R9089 Other abnormal findings on diagnostic imaging of central nervous system: Secondary | ICD-10-CM

## 2014-11-03 DIAGNOSIS — M47816 Spondylosis without myelopathy or radiculopathy, lumbar region: Secondary | ICD-10-CM | POA: Diagnosis not present

## 2014-11-03 MED ORDER — GADOBENATE DIMEGLUMINE 529 MG/ML IV SOLN
18.0000 mL | Freq: Once | INTRAVENOUS | Status: AC | PRN
  Administered 2014-11-03: 18 mL via INTRAVENOUS

## 2014-11-04 ENCOUNTER — Ambulatory Visit (HOSPITAL_BASED_OUTPATIENT_CLINIC_OR_DEPARTMENT_OTHER): Payer: Medicare PPO | Admitting: Hematology and Oncology

## 2014-11-04 ENCOUNTER — Encounter: Payer: Self-pay | Admitting: Hematology and Oncology

## 2014-11-04 VITALS — BP 137/63 | HR 74 | Temp 98.2°F | Resp 18 | Ht 64.8 in | Wt 188.7 lb

## 2014-11-04 DIAGNOSIS — R9089 Other abnormal findings on diagnostic imaging of central nervous system: Secondary | ICD-10-CM

## 2014-11-04 DIAGNOSIS — R930 Abnormal findings on diagnostic imaging of skull and head, not elsewhere classified: Secondary | ICD-10-CM

## 2014-11-04 DIAGNOSIS — I2584 Coronary atherosclerosis due to calcified coronary lesion: Secondary | ICD-10-CM

## 2014-11-04 DIAGNOSIS — I2510 Atherosclerotic heart disease of native coronary artery without angina pectoris: Secondary | ICD-10-CM | POA: Diagnosis not present

## 2014-11-04 DIAGNOSIS — G9389 Other specified disorders of brain: Secondary | ICD-10-CM | POA: Diagnosis not present

## 2014-11-04 DIAGNOSIS — I639 Cerebral infarction, unspecified: Secondary | ICD-10-CM

## 2014-11-04 LAB — CULTURE, FUNGUS WITHOUT SMEAR

## 2014-11-04 NOTE — Assessment & Plan Note (Addendum)
I have an extensive discussion with the patient and family members. I reviewed the test results in great detail including blood work, MRI of her spine, MRI of her brain and PET/CT scan. She have relatively stable MRI of the brain, no worsening or progression of neurological deficit and presence of photopenia on recent PET CT scan in the area of abnormalities. Overall, I do not believe that the patient have CNS lymphoma. The patient has a lot of persistent neurological deficits which I believe is likely from recent stroke. She has stopped taking aspirin recently and asked her to resume that. She have significant neurocognitive impairment and is wondering whether we could be missing other diagnoses such as multiple sclerosis. I recommend consideration for second opinion in a tertiary center to address all her concerns and to explain other potential causes of abnormal imaging seen in her brain.

## 2014-11-04 NOTE — Assessment & Plan Note (Signed)
She has evidence of coronary artery calcification throughout. I suspect the patient may have undiagnosed heart disease. With recent findings of possible stroke, I recommend consultation with a cardiologist. The patient wants to do some research on her own before referral. I recommend she takes aspirin indefinitely.

## 2014-11-04 NOTE — Assessment & Plan Note (Signed)
The brain imaging studies are stable 6 months apart. The evidence of photopenia in the PET CT scan suggested that the area of the brain is not active, pretty much excluded the possibility of malignancy. I think taken overall the presence of atherosclerosis seen in her imaging I do believe the patient suffered from stroke 6 months ago. Tests for lupus anticoagulation was negative Recommend she resume aspirin and aggressive risk factors management.

## 2014-11-04 NOTE — Progress Notes (Signed)
[place] NOTE  WHITE,[name], MD SUMMARY OF HEMATOLOGIC HISTORY:  CHIEF COMPLAINTS/PURPOSE OF CONSULTATION:  Abnormal imaging study, suspicious cells on recent diagnostic lumbar puncture, exclude CNS lymphoma  HISTORY OF PRESENTING ILLNESS:  Lindsay Clark 74 y.o. female is here because of neurological deficit that started around November 2015. Around 02/12/2014, she had a recorded public speech that was described as "deliberative". Around November 2015, she had sciatica type pain radiating down to the right leg.  Around Thanksgiving, she have some mild slurring of speech and difficult perception with sense of time and difficulties organizing a bridge club at home. 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 MRI imaging study of the cervical spine in December 2015 was unremarkable from degenerative joint disease. Ultimately, she had imaging study of the brain which show abnormalities suspicious for stroke. She was started on aspirin and subsequently underwent extensive evaluation including MRA, echocardiogram and carotid Doppler which were unremarkable. She was referred to physical therapist and her symptoms continued to improve. At present time, she have gait imbalance with tendency to fall towards the right. She denies recent falls. At rest, her right foot tends to be inverted and that causes significant cramps. She have diffuse skin itching for some time with associated rash. She complained of change in taste and have unintentional 13 pound weight loss over the past few months. She also have increased sensitivities to sound. Her head is described as "heavy" with pressure but she denies any headaches. She denies any changes in her vision. On 10/08/2014, lumbar puncture showed no evidence of WBC, gram stains & cultures were negative. CSF protein and glucose & angiotensin-converting enzyme were within normal  limits. INTERVAL HISTORY: Lindsay Clark 74 y.o. female returns for further follow-up. She denies new neurological deficits. She continues to complain of difficulties with coordination and multitasking  I have reviewed the past medical history, past surgical history, social history and family history with the patient and they are unchanged from previous note.  ALLERGIES:  is allergic to amlodipine.  MEDICATIONS:  Current Outpatient Prescriptions  Medication Sig Dispense Refill  . ALTACE 5 MG capsule Take 20 mg by mouth daily.     . clobetasol ointment (TEMOVATE) 0.09 % Apply 1 application topically 2 (two) times daily as needed.    . Clobetasol Propionate 0.05 % shampoo Apply topically daily as needed.     . fluocinonide cream (LIDEX) 3.81 % Apply 1 application topically daily as needed.     . fluticasone (FLONASE) 50 MCG/ACT nasal spray Place 1 spray into both nostrils as needed for allergies or rhinitis.    Marland Kitchen gabapentin (NEURONTIN) 300 MG capsule Take 300 mg by mouth daily.     . hydrochlorothiazide (HYDRODIURIL) 25 MG tablet Take 25 mg by mouth daily.    . Multiple Vitamins-Minerals (ONE-A-DAY WOMENS 50 PLUS PO) Take by mouth daily.    . Omega-3 Fatty Acids (FISH OIL) 1000 MG CAPS Take 2 capsules by mouth daily.    . tacrolimus (PROTOPIC) 0.1 % ointment Apply topically 2 (two) times daily.     No current facility-administered medications for this visit.     REVIEW OF SYSTEMS:   Constitutional: Denies fevers, chills or night sweats Eyes: Denies blurriness of vision Ears, nose, mouth, throat, and face: Denies mucositis or sore throat Respiratory: Denies cough, dyspnea or wheezes Cardiovascular: Denies palpitation, chest discomfort or lower extremity swelling Gastrointestinal:  Denies nausea, heartburn or change  in bowel habits Skin: Denies abnormal skin rashes Lymphatics: Denies new lymphadenopathy or easy bruising Neurological:Denies numbness, tingling or new  weaknesses Behavioral/Psych: Mood is stable, no new changes  All other systems were reviewed with the patient and are negative.  PHYSICAL EXAMINATION: ECOG PERFORMANCE STATUS: 0 - Asymptomatic  Filed Vitals:   11/04/14 1141  BP: 137/63  Pulse: 74  Temp: 98.2 F (36.8 C)  Resp: 18   Filed Weights   11/04/14 1141  Weight: 188 lb 11.2 oz (85.594 kg)    GENERAL:alert, no distress and comfortable SKIN: skin color, texture, turgor are normal, no rashes or significant lesions EYES: normal, Conjunctiva are pink and non-injected, sclera clear Musculoskeletal:no cyanosis of digits and no clubbing  NEURO: alert & oriented x 3 with fluent speech, with persistent inversion of the right foot  LABORATORY DATA:  I have reviewed the data as listed No results found for this or any previous visit (from the past 48 hour(s)).  Lab Results  Component Value Date   WBC 7.8 10/20/2014   HGB 13.9 10/20/2014   HCT 40.8 10/20/2014   MCV 89.9 10/20/2014   PLT 217 10/20/2014    RADIOGRAPHIC STUDIES: I reviewed her MRI of brain, MRI of spine and PET CT scan with patient and family members I have personally reviewed the radiological images as listed and agreed with the findings in the report. Mr Total Spine Mets Screening  11/03/2014   CLINICAL DATA:  74 year old female with abnormal brain MRI since December, possible CNS lymphoma. Subsequent encounter.  EXAM: MRI TOTAL SPINE WITHOUT AND WITH CONTRAST  TECHNIQUE: Multisequence MR imaging of the spine from the cervical spine to the sacrum was performed prior to and following IV contrast administration for evaluation of spinal metastatic disease.  CONTRAST:  21mL MULTIHANCE GADOBENATE DIMEGLUMINE 529 MG/ML IV SOLN  COMPARISON:  Brain MRI 09/30/2014. PET-CT 10/29/2014. Lumbar MRI 03/13/2014  FINDINGS: Cervical Findings:  Bone marrow signal is within normal limits throughout the cervical spine. Multilevel cervical spine disc and posterior element degeneration  incidentally noted. Mild anterolisthesis at C4-C5. Mild degenerative related posterior element enhancement at the right C4-C5 facet.  Cervicomedullary junction is within normal limits. No cervical spinal cord signal abnormality identified. No abnormal intradural enhancement.  Thoracic Findings:  Bone marrow signal throughout the thoracic spine is within normal limits; benign vertebral body hemangiomas at T3, and T6.  Mild degenerative upper thoracic anterolisthesis. No thoracic spinal stenosis. No thoracic spinal cord signal abnormality. No abnormal intradural enhancement.  Lumbar Findings:  Stable lumbosacral spine bone marrow signal, normal aside from benign degenerative marrow signal changes from L3-L4 to L5-S1. Mildly decreased degenerative endplate edema at W5-Y0 since 2015. Mild degenerative posterior element enhancement in the lower lumbar levels.  Chronic degenerative lumbar spinal stenosis appears not significantly changed. Visualized lower thoracic spinal cord is normal with conus medularis at T12-L1.  Small, 1 cm or smaller uterine fibroids incidentally noted.  IMPRESSION: 1. Negative for metastatic disease in the cervical, thoracic, and lumbosacral spine. 2. Degenerative changes. Chronic degenerative lumbar spinal stenosis.   Electronically Signed   By: Genevie Ann M.D.   On: 11/03/2014 08:30    ASSESSMENT & PLAN:  Abnormal finding on MRI of brain I have an extensive discussion with the patient and family members. I reviewed the test results in great detail including blood work, MRI of her spine, MRI of her brain and PET/CT scan. She have relatively stable MRI of the brain, no worsening or progression of neurological deficit and  presence of photopenia on recent PET CT scan in the area of abnormalities. Overall, I do not believe that the patient have CNS lymphoma. The patient has a lot of persistent neurological deficits which I believe is likely from recent stroke. She has stopped taking aspirin  recently and asked her to resume that. She have significant neurocognitive impairment and is wondering whether we could be missing other diagnoses such as multiple sclerosis. I recommend consideration for second opinion in a tertiary center to address all her concerns and to explain other potential causes of abnormal imaging seen in her brain.  Stroke with cerebral ischemia The brain imaging studies are stable 6 months apart. The evidence of photopenia in the PET CT scan suggested that the area of the brain is not active, pretty much excluded the possibility of malignancy. I think taken overall the presence of atherosclerosis seen in her imaging I do believe the patient suffered from stroke 6 months ago. Tests for lupus anticoagulation was negative Recommend she resume aspirin and aggressive risk factors management.  Coronary artery calcification of native artery She has evidence of coronary artery calcification throughout. I suspect the patient may have undiagnosed heart disease. With recent findings of possible stroke, I recommend consultation with a cardiologist. The patient wants to do some research on her own before referral. I recommend she takes aspirin indefinitely.     All questions were answered. The patient knows to call the clinic with any problems, questions or concerns. No barriers to learning was detected.  I spent 55 minutes counseling the patient face to face. The total time spent in the appointment was 60 minutes and more than 50% was on counseling.     [facility], Konner Saiz, MD 7/14/20165:32 PM

## 2014-11-10 ENCOUNTER — Ambulatory Visit (INDEPENDENT_AMBULATORY_CARE_PROVIDER_SITE_OTHER): Payer: Medicare PPO | Admitting: Neurology

## 2014-11-10 ENCOUNTER — Encounter: Payer: Self-pay | Admitting: Neurology

## 2014-11-10 VITALS — BP 128/68 | HR 72 | Resp 18 | Ht 64.8 in | Wt 187.9 lb

## 2014-11-10 DIAGNOSIS — R930 Abnormal findings on diagnostic imaging of skull and head, not elsewhere classified: Secondary | ICD-10-CM | POA: Diagnosis not present

## 2014-11-10 DIAGNOSIS — R9089 Other abnormal findings on diagnostic imaging of central nervous system: Secondary | ICD-10-CM

## 2014-11-10 NOTE — Progress Notes (Signed)
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  Repeat MRI of the brain reviewed.  She is accompanied by her husband who provides some history.  UPDATE: She saw Dr. Sherwood Gambler of neurosurgery on 10/22/14.  At this point, he has deferred to Dr. Alvy Bimler and myself regarding possible stereotactic biopsy.  To further evaluate MRI findings, she underwent LP.  CSF showed cell count of 1, protein 24, glucose 57, gram stain and culture negative, IgG index 1, 2 bands, and atypical cells on cytology.  She was referred to oncology for further evaluation, such as lymphoma.  MRI of the cervical, thoracic and lumbar spine were unremarkable.  PET scan was negative for malignancy but did show relative photopenia involving the left basal ganglia, corresponding to the abnormal MRI signal.  Lupus anticoagulant panel and D-Dimer were negative.  Dr. Alvy Bimler of Oncology did not suspect CNS lymphoma and thought it was still likely an atypical presentation of stroke.  Overall, symptoms have been stable.  In the evenings, her right foot will feel numb and heavy.  She still has trouble multi-tasking.  She still has some dystonic posturing of the right hand and foot.  Fine-toe movements are worse.  Playing piano has improved.  HISTORY: In October-early November, she began noticing trouble gripping the fork with her right hand, as well as peeling off tops of cans or opening jars.  On Thanksgiving, she noticed trouble multitasking, specifically preparing all the food for dinner.  She also felt a sensation that time was moving slowly.  After Thanksgiving, she had trouble using her 4th and 5th fingers on the right hand to play the piano, and she also had trouble maintaining stability of her right foot/ankle on the pedal.  She noted  numbness involving the right hand and foot.  She began exhibiting dystonic posturing of the right hand as well as twitching of the right foot.  At one point, her husband noted that she had right facial droop and slurred speech.  She also reports that food doesn't taste good and therefore she has lost about 12 lbs since Thanksgiving.  She denied fever.  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  MRI of cervical spine showed spondylosis but no compressive right sided lesion that would account for right sided weakness.  MRA of head performed 04/13/14 showed no major intracranial stenosis.  2D echo performed on 04/13/14 was unremarkable with a LVEF of 55-60% and no source of embolus.  Carotid duplex showed no hemodynamically significant ICA stenosis.  She was started on ASA 81mg  daily and she went to physical therapy.  Her strength improved but then started to gradually progress again.  She occasionally has episode of head pressure but not headache.  However, she continues to have dysgeusia and twitching of the right foot at times, particularly when she is laying down.  She is able to suppress it when it occurs but not indefinitely.  In addition to difficulty with concentration and multitasking, she exhibited emotional lability where she would easily cry.  .  She had another MRI of the brain with and without contrast performed on 06/30/14 which appears unchanged except for possible increased T2 signal and small smudgy areas  of enhancement within the left basal ganglia.  However the radiologist raised the possibility of an infiltrating glioma because of the absence of typical evolutionary changes of infarction.  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   Symptoms fluctuate in regards to gait and use of right arm and leg, but have relatively been stable.  She has to  actively think how to move her extremities on the right.  She reports difficulty getting out of a car.  Walking improves with distance.  Writing has gotten better.  She continues PT at [facility].  Due to persistent difficulty with concentration and emotional lability, she had neuropsychological testing performed on 06/30/14, which indicates that these symptoms are more likely related to anxiety and agitation rather than an organic etiology.  She had a repeat MRI of the brain with and without contrast performed on 09/30/14.  The abnormality in the left basal ganglia was unchanged.  There is no abnormal enhancement or mass effect.  A tumor such as lymphoma or low grade glioma was suggested.  There is also a new 6 x 4 x 3 mm nodular area of enhancement within the left cerebellum which possibly may be spread of tumor.  PAST MEDICAL HISTORY: Past Medical History  Diagnosis Date  . Stroke   . Chronic dermatitis   . Hypertension   . Thrombotic stroke involving middle cerebral artery   . [redacted] week gestation of pregnancy   . 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 ALTACE 5 MG capsule Take 20 mg by mouth daily.     . clobetasol ointment (TEMOVATE) 6.33 % Apply 1 application topically 2 (two) times daily as needed.    . Clobetasol Propionate 0.05 % shampoo Apply topically daily as needed.     . fluocinonide cream (LIDEX) 3.54 % Apply 1 application topically daily as needed.     . fluticasone (FLONASE) 50 MCG/ACT nasal spray Place 1 spray into both nostrils as needed for allergies or rhinitis.    Marland Kitchen gabapentin (NEURONTIN) 300 MG capsule Take 300 mg by mouth daily.     . hydrochlorothiazide (HYDRODIURIL) 25 MG tablet Take 25 mg by mouth daily.    . Multiple Vitamins-Minerals (ONE-A-DAY WOMENS 50 PLUS PO) Take by mouth daily.    . Omega-3 Fatty Acids (FISH  OIL) 1000 MG CAPS Take 2 capsules by mouth daily.    . tacrolimus (PROTOPIC) 0.1 % ointment Apply topically 2 (two) times daily.     No current facility-administered medications on file prior to visit.    ALLERGIES: Allergies  Allergen Reactions  . Amlodipine     FAMILY HISTORY: Family History  Problem Relation Age of Onset  . Stroke Mother   . Colon cancer Mother   . Heart attack Father   . Emphysema Father   . Thyroid disease Son   . Thyroid disease Maternal Grandmother   . Cancer Maternal Grandmother     cervical   . Thrombosis Maternal Grandfather     SOCIAL HISTORY: History   Social History  . Marital Status: Married    Spouse Name: [name]  . Number of Children: 2  . Years of Education: MAx2   Occupational History  . Retired Other   Social History Main Topics  . Smoking status: Never Smoker   . Smokeless tobacco: Never Used  . Alcohol Use: 0.0  oz/week    0 Standard drinks or equivalent per week     Comment: 4 glasses of wine weekly  . Drug Use: No  . Sexual Activity: No   Other Topics Concern  . Not on file   Social History Narrative   Patient lives at home with her spouse.   Caffeine Use: 2-3 cups daily    REVIEW OF SYSTEMS: Constitutional: No fevers, chills, or sweats, no generalized fatigue, change in appetite Eyes: No visual changes, double vision, eye pain Ear, nose and throat: No hearing loss, ear pain, nasal congestion, sore throat Cardiovascular: No chest pain, palpitations Respiratory:  No shortness of breath at rest or with exertion, wheezes GastrointestinaI: No nausea, vomiting, diarrhea, abdominal pain, fecal incontinence Genitourinary:  No dysuria, urinary retention or frequency Musculoskeletal:  No neck pain, back pain Integumentary: No rash, pruritus, skin lesions Neurological: as above Psychiatric: No depression, insomnia, anxiety Endocrine: No palpitations, fatigue, diaphoresis, mood swings, change in appetite, change in weight,  increased thirst Hematologic/Lymphatic:  No anemia, purpura, petechiae. Allergic/Immunologic: no itchy/runny eyes, nasal congestion, recent allergic reactions, rashes  PHYSICAL EXAM: Filed Vitals:   11/10/14 1317  BP: 128/68  Pulse: 72  Resp: 18   General: No acute distress.  Patient appears well-groomed.  normal body habitus. Head:  Normocephalic/atraumatic Eyes:  Fundoscopic exam unremarkable without vessel changes, exudates, hemorrhages or papilledema. Neck: supple, no paraspinal tenderness, full range of motion Heart:  Regular rate and rhythm Lungs:  Clear to auscultation bilaterally Back: No paraspinal tenderness Neurological Exam: alert and oriented to person, place, and time. Attention span and concentration intact, recent and remote memory intact, fund of knowledge intact.  Speech fluent and not dysarthric, language intact.  CN II-XII intact. Fundoscopic exam unremarkable without vessel changes, exudates, hemorrhages or papilledema.  Bulk and tone normal with 5/5 muscle strength except for dystonic posture of right hand and with eversion of the right foot.  Mildly reduced finger-thumb tapping and amplitude on right.  Mildly reduced pinprick sensation of the right foot.  Vibration sensation intact.  Deep tendon reflexes slightly brisk in right patellar, otherwise 2+ throughout, toes downgoing.  Finger to nose and heel to shin testing intact.  Mild clumsiness nvolving the right foot.Marland Kitchen  IMPRESSION & PLAN: Abnormal MRI of brain involving left basal ganglia.  CNS lymphoma not suspected by heme/onc.  Stroke versus low-grade glioma still possible.  The gradual onset of symptoms over several weeks is not typical for stroke.  However, the fact that the clinical decline stabilized is more suggestive of stroke.  After discussing with neuro-radiologists, the etiology of the lesion is still not clear.  Also, I am not sure what to make of the small foci of abnormal enhancement seen in the cerebellum  in the most recent MRI.  I think further evaluation is warranted..  One option would be stereotactic brain biopsy.  A non-invasive test would be MR spectroscopy, however it may not provide a clear distinction.  If malignancy is suspected, then surveillance imaging every 6 months for a few years is another option.  I would favor evaluation with the brain tumor clinic at [facility].  She and her husband are in agreement with this plan.  In the meantime, she will continue ASA 81mg  daily.  30 minutes spent face to face with patient, over 50% spent discussing differential diagnoses and management options.  Metta Clines, DO  CC:  [name], MD

## 2014-11-10 NOTE — Patient Instructions (Signed)
Continue aspirin 81mg  daily  We will refer you to Neuro-oncology at [facility] for consultation. Once an appointment is made, schedule a follow up with me afterwards.  If they continue to carry on your care, then you can always call to cancel my appointment.  Call with questions or concerns.

## 2014-11-11 ENCOUNTER — Telehealth: Payer: Self-pay | Admitting: Neurology

## 2014-11-11 NOTE — Telephone Encounter (Signed)
Lindsay Clark, Pt. Lindsay Clark, needs her unique#? Care everywhere? for [facility]/call back PH# [phone]

## 2014-11-11 NOTE — Telephone Encounter (Signed)
I have spoke with this patient she was instructed to get ALL MRI PET CT any imagine on a CD and send to [facility] via overnight .

## 2014-11-18 ENCOUNTER — Telehealth: Payer: Self-pay | Admitting: Neurology

## 2014-11-18 NOTE — Telephone Encounter (Signed)
Pt called and would like to stop by around 4pm today to pick up a copy of her last office not and also a copy of the referral letter to [facility]  CB# [phone]

## 2014-11-18 NOTE — Telephone Encounter (Signed)
Office note is at the front desk

## 2014-11-23 DIAGNOSIS — D496 Neoplasm of unspecified behavior of brain: Secondary | ICD-10-CM | POA: Diagnosis not present

## 2014-11-23 DIAGNOSIS — H2513 Age-related nuclear cataract, bilateral: Secondary | ICD-10-CM | POA: Diagnosis not present

## 2014-11-23 DIAGNOSIS — H25011 Cortical age-related cataract, right eye: Secondary | ICD-10-CM | POA: Diagnosis not present

## 2014-11-23 DIAGNOSIS — H1859 Other hereditary corneal dystrophies: Secondary | ICD-10-CM | POA: Diagnosis not present

## 2014-11-23 DIAGNOSIS — H43813 Vitreous degeneration, bilateral: Secondary | ICD-10-CM | POA: Diagnosis not present

## 2014-11-26 ENCOUNTER — Telehealth: Payer: Self-pay | Admitting: *Deleted

## 2014-11-26 NOTE — Telephone Encounter (Signed)
Patient would like an order for a right foot ????? (hard brace) Call back number [phone]

## 2014-11-29 ENCOUNTER — Encounter: Payer: Self-pay | Admitting: *Deleted

## 2014-11-29 NOTE — Telephone Encounter (Signed)
Patient will let me know the type of hard brace needed and well will order

## 2014-12-08 DIAGNOSIS — G939 Disorder of brain, unspecified: Secondary | ICD-10-CM | POA: Diagnosis not present

## 2014-12-08 DIAGNOSIS — G259 Extrapyramidal and movement disorder, unspecified: Secondary | ICD-10-CM | POA: Diagnosis not present

## 2014-12-14 ENCOUNTER — Encounter: Payer: Self-pay | Admitting: Psychology

## 2014-12-14 ENCOUNTER — Ambulatory Visit: Payer: Medicare PPO | Attending: Psychology | Admitting: Psychology

## 2014-12-14 DIAGNOSIS — F4322 Adjustment disorder with anxiety: Secondary | ICD-10-CM | POA: Diagnosis not present

## 2014-12-14 NOTE — Progress Notes (Signed)
[facility]  [address]   Telephone [phone] [address] Fax [phone] [place], [place] [address]   Psychology Progress Note   Name:  Lindsay Clark Date of Birth:  1941/01/20 MRN:  [id number]  Date:12/14/2014 (61m psychotherapy I last met with [name] on 07/14/14. She emailed me a request for another appointment for "assistance in dealing with my emotions during this period of uncertainty".  She expressed frustration with the continued uncertainty about her neurological diagnosis. She reported that she has consulted with various medical specialists from different medical centers and has undergone multiple brain MRI scans, a PET scan and a lumbar puncture though without finding a definitive cause for her left basal ganglia abnormality. It is her understanding that a stroke has been ruled out; and that a tumor or movement disorder are being considered as possibilities.She reported experiencing persisting symptoms of right hand dystonia, right foot inversion, right-sided weakness, tendency to lose her balance to the right side, increased sensitivity to loud noise and cognitive changes (i.e., decreased concentration, sequencing, multitasking). She nonetheless continues to live a fully independent lifestyle. She also reported feeling stressed due to her husband's major back surgery scheduled for 12/30/14. She stated her concern that she will not be able to help him post-surgery to the same extent she had done prior to her neurological illness.  She presented as alert, pleasant and verbal. Mood seemed mildly anxious. Affect was full and well-modulated with with brief tearfulness. She denied depressed mood, suicidal ideation, behavioral dyscontrol and psychotic symptoms.  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 She was reassured that it is not unusual  for it to take many months or even years to diagnose what appears to be an atypical neurological disorder. She asked me to speculate what I thought her neurological problem might be but I declined and explained the limits of my practice and training. I did review in general the function of the basal ganglia. I encouraged her to be less hard on herself (echoing what her husband has said to her) and realize that although her physical and cognitive abilities may be altered, her ability to emotionally connect to and care for her husband has not been diminished.   We agreed that she will return to see me as needed.      MJamey Ripa Ph.D Licensed Psychologist

## 2014-12-28 DIAGNOSIS — G259 Extrapyramidal and movement disorder, unspecified: Secondary | ICD-10-CM | POA: Diagnosis not present

## 2014-12-28 DIAGNOSIS — G939 Disorder of brain, unspecified: Secondary | ICD-10-CM | POA: Diagnosis not present

## 2015-01-05 DIAGNOSIS — L309 Dermatitis, unspecified: Secondary | ICD-10-CM | POA: Diagnosis not present

## 2015-01-05 DIAGNOSIS — D485 Neoplasm of uncertain behavior of skin: Secondary | ICD-10-CM | POA: Diagnosis not present

## 2015-01-10 ENCOUNTER — Telehealth: Payer: Self-pay | Admitting: Neurology

## 2015-01-10 NOTE — Telephone Encounter (Signed)
I called patient back and She states that she saw Dr. Derrill Memo at the [place] at [facility]. She states that he told her she did not have a brain tumor and has since referred her to a Horticulturist, commercial. She states that Neurologist does not think she has a stroke or a brain tumor and she said that a brain biopsy would not be his first "manuever". Patient states that he wants her to see a Movement disorder specialist and the soonest they can see the patient is mid January. She states that she cannot wait that long because she feels like her muscles are deterioating. She wanted me to ask you if there is another recourse? And she also wanted you to review her [facility] notes in [place]. Thanks

## 2015-01-10 NOTE — Telephone Encounter (Signed)
Called about referral to [facility] [phone]

## 2015-01-11 NOTE — Telephone Encounter (Signed)
I read the notes from [place].  At this point, it seems that nobody really knows what is going on, so testing is still ongoing.  Due to the complexity of her case, I would definitely keep the appointment at [facility].  In the meantime, I will discuss her case with my colleague, who is a movement disorder specialist, and she can tell me if she feels a sooner appointment with her would be reasonable.  She is out of the office today but will be back tomorrow, so I will discuss it with her then.

## 2015-01-11 NOTE — Telephone Encounter (Signed)
LMTCB.. MAH 

## 2015-01-12 NOTE — Telephone Encounter (Signed)
I spoke with my colleague, Dr. Carles Collet.  She doesn't feel that an evaluation with her would be beneficial because the primary question is what exactly is the finding on MRI.  She would not be able to answer that.  I do not know what it is and so I defer to [facility].  If they believe the course of action is to follow up with their movement disorder specialist, then I would do so.

## 2015-01-12 NOTE — Telephone Encounter (Signed)
Spoke with patient. Patient was upset on the phone. She states that she feels like she has been abandoned. She stated that this is very frightening for her and she states that "no one seems to know what's going on and no one seems to care" She stated that she wanted to know if there is anymore testing that we can do? She stated that her body is deteriorating before her eyes and she cannot get any answers. I told her she needed to discuss this with [facility] and she said nobody there has any answers. Please advise on next step.

## 2015-01-12 NOTE — Telephone Encounter (Signed)
Spoke with pt. Pt. Advised that Dr. Tomi Likens would speak with Dr. Carles Collet today and we will give her a call back.

## 2015-01-12 NOTE — Telephone Encounter (Signed)
After all of the tests I have performed, I just don't know what it is.  I don't know what other testing would be appropriate.  I feel that this is really something that needs to be evaluated at an academic center.  If she would like to be evaluated anywhere else, I will refer her.

## 2015-01-26 DIAGNOSIS — E785 Hyperlipidemia, unspecified: Secondary | ICD-10-CM | POA: Diagnosis not present

## 2015-01-31 DIAGNOSIS — M6281 Muscle weakness (generalized): Secondary | ICD-10-CM | POA: Diagnosis not present

## 2015-01-31 DIAGNOSIS — L609 Nail disorder, unspecified: Secondary | ICD-10-CM | POA: Diagnosis not present

## 2015-01-31 DIAGNOSIS — I10 Essential (primary) hypertension: Secondary | ICD-10-CM | POA: Diagnosis not present

## 2015-01-31 DIAGNOSIS — E785 Hyperlipidemia, unspecified: Secondary | ICD-10-CM | POA: Diagnosis not present

## 2015-02-07 ENCOUNTER — Emergency Department (HOSPITAL_COMMUNITY)
Admission: EM | Admit: 2015-02-07 | Discharge: 2015-02-07 | Disposition: A | Discharge status: Home / Self Care | Payer: Medicare PPO | Attending: Emergency Medicine | Admitting: Emergency Medicine

## 2015-02-07 ENCOUNTER — Encounter (HOSPITAL_COMMUNITY): Payer: Self-pay | Admitting: *Deleted

## 2015-02-07 DIAGNOSIS — Z7982 Long term (current) use of aspirin: Secondary | ICD-10-CM | POA: Insufficient documentation

## 2015-02-07 DIAGNOSIS — R42 Dizziness and giddiness: Secondary | ICD-10-CM | POA: Diagnosis not present

## 2015-02-07 DIAGNOSIS — R404 Transient alteration of awareness: Secondary | ICD-10-CM | POA: Diagnosis not present

## 2015-02-07 DIAGNOSIS — R112 Nausea with vomiting, unspecified: Secondary | ICD-10-CM | POA: Diagnosis not present

## 2015-02-07 DIAGNOSIS — Z8673 Personal history of transient ischemic attack (TIA), and cerebral infarction without residual deficits: Secondary | ICD-10-CM | POA: Diagnosis not present

## 2015-02-07 DIAGNOSIS — Z79899 Other long term (current) drug therapy: Secondary | ICD-10-CM | POA: Diagnosis not present

## 2015-02-07 DIAGNOSIS — I10 Essential (primary) hypertension: Secondary | ICD-10-CM | POA: Insufficient documentation

## 2015-02-07 DIAGNOSIS — R55 Syncope and collapse: Secondary | ICD-10-CM | POA: Diagnosis present

## 2015-02-07 HISTORY — DX: Abnormal findings on diagnostic imaging of skull and head, not elsewhere classified: R93.7

## 2015-02-07 HISTORY — DX: Abnormal findings on diagnostic imaging of skull and head, not elsewhere classified: R93.0

## 2015-02-07 LAB — CBC
HEMATOCRIT: 41.8 % (ref 36.0–46.0)
HEMOGLOBIN: 13.8 g/dL (ref 12.0–15.0)
MCH: 29.8 pg (ref 26.0–34.0)
MCHC: 33 g/dL (ref 30.0–36.0)
MCV: 90.3 fL (ref 78.0–100.0)
Platelets: 200 10*3/uL (ref 150–400)
RBC: 4.63 MIL/uL (ref 3.87–5.11)
RDW: 13.3 % (ref 11.5–15.5)
WBC: 7.9 10*3/uL (ref 4.0–10.5)

## 2015-02-07 LAB — BASIC METABOLIC PANEL
ANION GAP: 11 (ref 5–15)
BUN: 14 mg/dL (ref 6–20)
CHLORIDE: 100 mmol/L — AB (ref 101–111)
CO2: 28 mmol/L (ref 22–32)
Calcium: 9 mg/dL (ref 8.9–10.3)
Creatinine, Ser: 0.62 mg/dL (ref 0.44–1.00)
Glucose, Bld: 113 mg/dL — ABNORMAL HIGH (ref 65–99)
POTASSIUM: 3.2 mmol/L — AB (ref 3.5–5.1)
SODIUM: 139 mmol/L (ref 135–145)

## 2015-02-07 MED ORDER — ONDANSETRON 8 MG PO TBDP: 8.0000 mg | ORAL_TABLET | Freq: Three times a day (TID) | ORAL | Status: DC | PRN

## 2015-02-07 NOTE — ED Provider Notes (Signed)
CSN: [id number]     Arrival date & time 02/07/15  0050 History  By signing my name below, I, Lindsay Clark, attest that this documentation has been prepared under the direction and in the presence of [name], MD. Electronically Signed: Hansel Clark, ED Scribe. 02/07/2015. 2:16 AM.    Chief Complaint  Patient presents with  . Near Syncope   The history is provided by the patient and the spouse. No language interpreter was used.    HPI Comments: Lindsay Clark is a 74 y.o. female who presents to the Emergency Department complaining of an episode of near syncope that occurred just PTA after standing up suddenly from a seated position. She states her symptoms began with nausea followed by diaphoresis, dizziness and an episode of emesis. Pt states that she fell to the floor but did not lose consciousness. No head injury. She states that she felt asymptomatic all day until the episode. She reports no current nausea. Husband denies facial asymmetry, hemiplegia, slurred speech. He reports that she appears to be at her baseline. PCP is Dr. Dema Severin with [facility]. She denies abdominal pain, CP, chest pressure, neck pain, back pain, pain in the extremities, hemiplegia.  At this time she is without significant complaints.  Past Medical History  Diagnosis Date  . Stroke ([place])   . Chronic dermatitis   . Hypertension   . 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 [redacted] week gestation of pregnancy   . Stroke with cerebral ischemia ([place]) 10/20/2014  . 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 Multiple lesions on CT of brain and spine     reports lesions from previous CT scan at [facility]    Past Surgical History  Procedure Laterality Date  . Tubal ligation  1980   Family History  Problem Relation Age of Onset  . Stroke Mother   . Colon cancer Mother   . Heart attack Father   . Emphysema Father   . Thyroid disease Son   . Thyroid disease Maternal Grandmother   . Cancer Maternal Grandmother      cervical   . Thrombosis Maternal Grandfather    Social History  Substance Use Topics  . Smoking status: Never Smoker   . Smokeless tobacco: Never Used  . Alcohol Use: 0.0 oz/week    0 Standard drinks or equivalent per week     Comment: 4 glasses of wine weekly   OB History    No data available     Review of Systems A complete 10 system review of systems was obtained and all systems are negative except as noted in the HPI and PMH.    Allergies  Amlodipine  Home Medications   Prior to Admission medications   Medication Sig Start Date End Date Taking? Authorizing Provider  ALTACE 5 MG capsule Take 20 mg by mouth daily.  07/05/14   Historical Provider, MD  aspirin 81 MG tablet Take 81 mg by mouth daily.    Historical Provider, MD  clobetasol ointment (TEMOVATE) 8.46 % Apply 1 application topically 2 (two) times daily as needed.    Historical Provider, MD  Clobetasol Propionate 0.05 % shampoo Apply topically daily as needed.  05/07/14   Historical Provider, MD  fluocinonide cream (LIDEX) 6.59 % Apply 1 application topically daily as needed.  09/07/14   Historical Provider, MD  fluticasone (FLONASE) 50 MCG/ACT nasal spray Place 1 spray into both nostrils as needed for allergies or rhinitis.    Historical Provider, MD  gabapentin (NEURONTIN) 300 MG capsule Take 300 mg by mouth daily.     Historical Provider, MD  hydrochlorothiazide (HYDRODIURIL) 25 MG tablet Take 25 mg by mouth daily.    Historical Provider, MD  Multiple Vitamins-Minerals (ONE-A-DAY WOMENS 50 PLUS PO) Take by mouth daily.    Historical Provider, MD  Omega-3 Fatty Acids (FISH OIL) 1000 MG CAPS Take 2 capsules by mouth daily.    Historical Provider, MD  tacrolimus (PROTOPIC) 0.1 % ointment Apply topically 2 (two) times daily.    Historical Provider, MD   BP 116/57 mmHg  Pulse 76  Temp(Src) 97.7 F (36.5 C) (Oral)  Resp 15  SpO2 98% Physical Exam  Constitutional: She is oriented to person, place, and time. She appears  well-developed and well-nourished. No distress.  HENT:  Head: Normocephalic and atraumatic.  Eyes: EOM are normal. Pupils are equal, round, and reactive to light.  Neck: Normal range of motion.  Cardiovascular: Normal rate, regular rhythm and normal heart sounds.   Pulmonary/Chest: Effort normal and breath sounds normal.  Lungs CTA bilaterally.   Abdominal: Soft. Bowel sounds are normal. She exhibits no distension. There is no tenderness.  Musculoskeletal: Normal range of motion.  Neurological: She is alert and oriented to person, place, and time.  5/5 strength in major muscle groups of  bilateral upper and lower extremities. Speech normal. No facial asymetry.   Skin: Skin is warm and dry.  Psychiatric: She has a normal mood and affect. Judgment normal.  Nursing note and vitals reviewed.  ED Course  Procedures (including critical care time) DIAGNOSTIC STUDIES: Oxygen Saturation is 98% on RA, normal by my interpretation.    COORDINATION OF CARE: 2:14 AM Discussed treatment plan with pt at bedside and pt agreed to plan.   Labs Review Labs Reviewed  BASIC METABOLIC PANEL - Abnormal; Notable for the following:    Potassium 3.2 (*)    Chloride 100 (*)    Glucose, Bld 113 (*)    All other components within normal limits  CBC  CBG MONITORING, ED   I have personally reviewed and evaluated these lab results as part of my medical decision-making.   EKG Interpretation   Date/Time:  Monday February 07 2015 01:03:44 EDT Ventricular Rate:  74 PR Interval:  192 QRS Duration: 100 QT Interval:  417 QTC Calculation: 463 R Axis:   30 Text Interpretation:  Sinus rhythm Low voltage, precordial leads No old  tracing to compare Confirmed by Nikesha Kwasny  MD, Lennette Bihari ([id number]) on 02/07/2015  1:57:23 AM      MDM   Final diagnoses:  Nausea and vomiting, vomiting of unspecified type   Overall the patient is well-appearing.  She is without significant complaints at this time.  Nausea is resolved.   Her abdominal exam is benign.  Doubt ACS.  Normal neurologic exam.  Doubt stroke/TIA.  Discharge home in good condition.  Primary care follow-up.  Patient understands return to the ER for new or worsening symptoms.   I, Kein Carlberg M, personally performed the services described in this documentation. All medical record entries made by the scribe were at my direction and in my presence.  I have reviewed the chart and discharge instructions and agree that the record reflects my personal performance and is accurate and complete. Brezlyn Manrique M.  02/07/2015. 6:03 AM.         [name], MD 02/07/15 [phone]

## 2015-02-07 NOTE — ED Notes (Signed)
Dr. Campos at bedside   

## 2015-02-07 NOTE — ED Notes (Signed)
Pt to ED from home via [facility] c/o fall and near syncope. Pt got up to go to the bathroom to vomit, pt fell; denies complaints. Pt nauseated and diaphoretic on EMS arrival. Pt was noted to be orthostatic, lying bp 126/84, bp dropped to 98/70 during position changes.

## 2015-02-07 NOTE — ED Notes (Signed)
Pt ambulated around nurses station in pod a one time. Pt states she feels weaker than normal.

## 2015-02-08 DIAGNOSIS — L232 Allergic contact dermatitis due to cosmetics: Secondary | ICD-10-CM | POA: Diagnosis not present

## 2015-02-08 DIAGNOSIS — L820 Inflamed seborrheic keratosis: Secondary | ICD-10-CM | POA: Diagnosis not present

## 2015-02-08 DIAGNOSIS — L821 Other seborrheic keratosis: Secondary | ICD-10-CM | POA: Diagnosis not present

## 2015-02-08 DIAGNOSIS — L2089 Other atopic dermatitis: Secondary | ICD-10-CM | POA: Diagnosis not present

## 2015-02-09 ENCOUNTER — Telehealth: Payer: Self-pay | Admitting: Neurology

## 2015-02-09 NOTE — Telephone Encounter (Signed)
Pt called to request copies of her MRI from/ 03/2014/06/2014/09/2014//[id number]//or//[phone]

## 2015-02-10 DIAGNOSIS — H43813 Vitreous degeneration, bilateral: Secondary | ICD-10-CM | POA: Diagnosis not present

## 2015-02-10 DIAGNOSIS — H353132 Nonexudative age-related macular degeneration, bilateral, intermediate dry stage: Secondary | ICD-10-CM | POA: Diagnosis not present

## 2015-02-10 DIAGNOSIS — H35373 Puckering of macula, bilateral: Secondary | ICD-10-CM | POA: Diagnosis not present

## 2015-02-10 NOTE — Telephone Encounter (Signed)
Patient is coming in today to pick up reports.

## 2015-02-16 DIAGNOSIS — M6281 Muscle weakness (generalized): Secondary | ICD-10-CM | POA: Diagnosis not present

## 2015-03-08 DIAGNOSIS — H00011 Hordeolum externum right upper eyelid: Secondary | ICD-10-CM | POA: Diagnosis not present

## 2015-03-16 DIAGNOSIS — M6281 Muscle weakness (generalized): Secondary | ICD-10-CM | POA: Diagnosis not present

## 2015-03-16 DIAGNOSIS — G249 Dystonia, unspecified: Secondary | ICD-10-CM | POA: Insufficient documentation

## 2015-03-16 DIAGNOSIS — G248 Other dystonia: Secondary | ICD-10-CM | POA: Insufficient documentation

## 2015-03-30 DIAGNOSIS — L821 Other seborrheic keratosis: Secondary | ICD-10-CM | POA: Diagnosis not present

## 2015-03-30 DIAGNOSIS — G249 Dystonia, unspecified: Secondary | ICD-10-CM | POA: Diagnosis not present

## 2015-03-30 DIAGNOSIS — L299 Pruritus, unspecified: Secondary | ICD-10-CM | POA: Diagnosis not present

## 2015-03-30 DIAGNOSIS — G238 Other specified degenerative diseases of basal ganglia: Secondary | ICD-10-CM | POA: Diagnosis not present

## 2015-03-30 DIAGNOSIS — L853 Xerosis cutis: Secondary | ICD-10-CM | POA: Diagnosis not present

## 2015-03-30 DIAGNOSIS — R9082 White matter disease, unspecified: Secondary | ICD-10-CM | POA: Diagnosis not present

## 2015-04-06 DIAGNOSIS — G249 Dystonia, unspecified: Secondary | ICD-10-CM | POA: Diagnosis not present

## 2015-04-12 ENCOUNTER — Ambulatory Visit (INDEPENDENT_AMBULATORY_CARE_PROVIDER_SITE_OTHER): Payer: Medicare PPO | Admitting: Neurology

## 2015-04-12 ENCOUNTER — Encounter: Payer: Self-pay | Admitting: Neurology

## 2015-04-12 VITALS — BP 148/76 | HR 86 | Ht 65.0 in | Wt 188.4 lb

## 2015-04-12 DIAGNOSIS — Q048 Other specified congenital malformations of brain: Secondary | ICD-10-CM

## 2015-04-12 DIAGNOSIS — G259 Extrapyramidal and movement disorder, unspecified: Secondary | ICD-10-CM | POA: Diagnosis not present

## 2015-04-12 NOTE — Progress Notes (Signed)
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  Repeat MRI of the brain reviewed.  She is accompanied by her husband who provides some history.  History also obtained by notes from [facility] and [facility].  MRI reports over the past few months and labs reviewed.  UPDATE: She saw Dr. Derrill Memo of neurosurgery at [facility] for possible biopsy of the lesion.  He did not believe it was a malignancy and was referred to Dr. Elvia Collum for further evaluation.  She underwent another LP.  CSF showed protein 16, glucose 57, negative paraneoplastic panel, 3 oligoclonal bands.  Autoimmune panel revealed positive ANCA and AChR ganglionic neuronal antibodies.  Sed Rate was 6, IgA 167, TSH 1.57, free T4 0.74 and negative anti-thyroglobulin thyroid antibody.  B1 178.  Serum paraneoplastic panel and voltage-gated potassium channel antibody were negative.  She then saw Dr. Nedra Hai, a movement disorder specialist, at [facility] at [facility].  A repeat MRI of the brain with and without contrast from 03/30/15 showed that the basal ganglia lesion is unchanged.  A subtle signal abnormality in the left hippocampus was present.  No enhancement was seen in the cerebellum.  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  HISTORY: In October-early November, she began noticing trouble gripping the fork with her right hand, as well as peeling off tops of cans or opening jars.  On Thanksgiving, she noticed trouble multitasking, specifically preparing all the food for dinner.  She also felt a sensation that time was moving slowly.  After Thanksgiving, she had trouble using her 4th and 5th fingers on the right hand to play the piano, and she also had trouble maintaining stability of  her right foot/ankle on the pedal.  She noted numbness involving the right hand and foot.  She began exhibiting dystonic posturing of the right hand as well as twitching of the right foot.  At one point, her husband noted that she had right facial droop and slurred speech.  She also reports that food doesn't taste good and therefore she has lost about 12 lbs since Thanksgiving.  She denied fever.  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  MRI of cervical spine showed spondylosis but no compressive right sided lesion that would account for right sided weakness.  MRA of head performed 04/13/14 showed no major intracranial stenosis.  2D echo performed on 04/13/14 was unremarkable with a LVEF of 55-60% and no source of embolus.  Carotid duplex showed no hemodynamically significant ICA stenosis.  She was started on ASA 81mg  daily and she went to physical therapy.  Her strength improved but then started to gradually progress again.  She occasionally has episode of head pressure but not headache.  However, she continues to have dysgeusia and twitching of the right foot at times, particularly when she is laying down.  She is able to suppress it when it occurs but not indefinitely.  In addition to difficulty with concentration and multitasking, she exhibited emotional lability where she would easily cry.  .  She had another MRI of the brain with and without contrast performed on 06/30/14 which appears unchanged except for possible increased T2  signal and small smudgy areas of enhancement within the left basal ganglia.  However the radiologist raised the possibility of an infiltrating glioma because of the absence of typical evolutionary changes of infarction.  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   Symptoms fluctuated in regards to gait and use of right arm and leg, but  have relatively been stable.  She has to actively think how to move her extremities on the right.  She reports difficulty getting out of a car.  Walking improves with distance.  Writing has gotten better.  She continues PT at [facility].  She had a repeat MRI of the brain with and without contrast performed on 09/30/14.  The abnormality in the left basal ganglia was unchanged.  There is no abnormal enhancement or mass effect.  A tumor such as lymphoma or low grade glioma was suggested.  There was also a new 6 x 4 x 3 mm nodular area of enhancement within the left cerebellum which possibly may be spread of tumor.  She saw Dr. Sherwood Gambler of neurosurgery on 10/22/14.  At this point, he has deferred to Dr. Alvy Bimler and myself regarding possible stereotactic biopsy.  To further evaluate MRI findings, she underwent LP.  CSF showed cell count of 1, protein 24, glucose 57, gram stain and culture negative, IgG index 1, 2 bands, and atypical cells on cytology.  She was referred to oncology for further evaluation, such as lymphoma.  MRI of the cervical, thoracic and lumbar spine were unremarkable.  PET scan was negative for malignancy but did show relative photopenia involving the left basal ganglia, corresponding to the abnormal MRI signal.  Lupus anticoagulant panel and D-Dimer were negative.  Dr. Alvy Bimler of Oncology did not suspect CNS lymphoma.   PAST MEDICAL HISTORY: Past Medical History  Diagnosis Date  . Stroke ([place])   . Chronic dermatitis   . Hypertension   . 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 [redacted] week gestation of pregnancy   . Stroke with cerebral ischemia ([place]) 10/20/2014  . 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 Multiple lesions on CT of brain and spine     reports lesions from previous CT scan at [facility]     MEDICATIONS: Current Outpatient Prescriptions on File Prior to Visit  Medication Sig Dispense Refill  . ALTACE 5 MG capsule Take 5-15 mg by mouth 2 (two)  times daily. Take 15mg  in the morning and 5mg  in the evening.    . clobetasol ointment (TEMOVATE) AB-123456789 % Apply 1 application topically 2 (two) times daily as needed (dry skin).     . Clobetasol Propionate 0.05 % shampoo Apply 1 application topically daily as needed (itching).     . fluocinonide cream (LIDEX) [id number] % Apply 1 application topically daily as needed (itching).     . fluticasone (FLONASE) 50 MCG/ACT nasal spray Place 1 spray into both nostrils as needed for allergies or rhinitis.    Marland Kitchen gabapentin (NEURONTIN) 300 MG capsule Take 300 mg by mouth at bedtime.     . hydrochlorothiazide (HYDRODIURIL) 25 MG tablet Take 25 mg by mouth every morning.     . Multiple Vitamins-Minerals (ONE-A-DAY WOMENS 50 PLUS PO) Take 1 tablet by mouth daily.     . Omega-3 Fatty Acids (FISH OIL) 1000 MG CAPS Take 2 capsules by mouth daily.    . ondansetron (ZOFRAN ODT) 8 MG disintegrating tablet Take 1 tablet (8 mg total) by mouth every 8 (eight) hours  as needed for nausea or vomiting. 12 tablet 0  . tacrolimus (PROTOPIC) 0.1 % ointment Apply 1 application topically 2 (two) times daily as needed (psoriasis).      No current facility-administered medications on file prior to visit.    ALLERGIES: Allergies  Allergen Reactions  . Amlodipine     FAMILY HISTORY: Family History  Problem Relation Age of Onset  . Stroke Mother   . Colon cancer Mother   . Heart attack Father   . Emphysema Father   . Thyroid disease Son   . Thyroid disease Maternal Grandmother   . Cancer Maternal Grandmother     cervical   . Thrombosis Maternal Grandfather     SOCIAL HISTORY: Social History   Social History  . Marital Status: Married    Spouse Name: [name]  . Number of Children: 2  . Years of Education: MAx2   Occupational History  . Retired Other   Social History Main Topics  . Smoking status: Never Smoker   . Smokeless tobacco: Never Used  . Alcohol Use: 0.0 oz/week    0 Standard drinks or equivalent per week      Comment: 4 glasses of wine weekly  . Drug Use: No  . Sexual Activity: No   Other Topics Concern  . Not on file   Social History Narrative   Patient lives at home with her spouse.   Caffeine Use: 2-3 cups daily    REVIEW OF SYSTEMS: Constitutional: No fevers, chills, or sweats, no generalized fatigue, change in appetite Eyes: No visual changes, double vision, eye pain Ear, nose and throat: No hearing loss, ear pain, nasal congestion, sore throat Cardiovascular: No chest pain, palpitations Respiratory:  No shortness of breath at rest or with exertion, wheezes GastrointestinaI: No nausea, vomiting, diarrhea, abdominal pain, fecal incontinence Genitourinary:  No dysuria, urinary retention or frequency Musculoskeletal:  No neck pain, back pain Integumentary: No rash, pruritus, skin lesions Neurological: as above Psychiatric: No depression, insomnia, anxiety Endocrine: No palpitations, fatigue, diaphoresis, mood swings, change in appetite, change in weight, increased thirst Hematologic/Lymphatic:  No anemia, purpura, petechiae. Allergic/Immunologic: no itchy/runny eyes, nasal congestion, recent allergic reactions, rashes  PHYSICAL EXAM: Filed Vitals:   04/12/15 1529  BP: 148/76  Pulse: 86   General: No acute distress.  Patient appears well-groomed.  normal body habitus. Head:  Normocephalic/atraumatic Eyes:  Fundoscopic exam unremarkable without vessel changes, exudates, hemorrhages or papilledema. Neck: supple, no paraspinal tenderness, full range of motion Heart:  Regular rate and rhythm Lungs:  Clear to auscultation bilaterally Back: No paraspinal tenderness Neurological Exam: alert and oriented to person, place, and time. Attention span and concentration intact, recent and remote memory intact, fund of knowledge intact.  Speech fluent and not dysarthric, language intact.  CN II-XII intact. Fundoscopic exam unremarkable without vessel changes, exudates, hemorrhages or  papilledema.  Bulk and tone normal, trace weakness in right hand, otherwise muscle strength 5/5 throughout.  Dystonic posturing of right hand and foot.  Slowed finger tapping on the right.  Decreased pinprick sensation along outside of right foot.  Sensation to vibration intact.  Deep tendon reflexes 2+ throughout, toes downgoing.  Finger to nose and heel to shin testing intact.  Slightly unsteady gait with reduced arm-swing on right.  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  At this point, she is stable and does not appear to be progressing  PLAN: She will be continuing care  with Dr. Deniece Ree at [facility]  60 minutes spent face to face with patient, over 50% spent discussing diagnosis and updates.  Metta Clines, DO  CC:  [name], MD

## 2015-04-13 DIAGNOSIS — M6281 Muscle weakness (generalized): Secondary | ICD-10-CM | POA: Diagnosis not present

## 2015-04-13 NOTE — Progress Notes (Signed)
Chart forwarded.  

## 2015-04-15 DIAGNOSIS — G259 Extrapyramidal and movement disorder, unspecified: Secondary | ICD-10-CM | POA: Diagnosis not present

## 2015-04-15 DIAGNOSIS — M359 Systemic involvement of connective tissue, unspecified: Secondary | ICD-10-CM | POA: Diagnosis not present

## 2015-04-15 DIAGNOSIS — J069 Acute upper respiratory infection, unspecified: Secondary | ICD-10-CM | POA: Diagnosis not present

## 2015-04-19 ENCOUNTER — Encounter: Payer: Self-pay | Admitting: Diagnostic Neuroimaging

## 2015-04-19 ENCOUNTER — Ambulatory Visit (INDEPENDENT_AMBULATORY_CARE_PROVIDER_SITE_OTHER): Payer: Medicare PPO | Admitting: Diagnostic Neuroimaging

## 2015-04-19 VITALS — BP 124/73 | HR 80 | Ht 65.0 in | Wt 189.0 lb

## 2015-04-19 DIAGNOSIS — Q048 Other specified congenital malformations of brain: Secondary | ICD-10-CM

## 2015-04-19 NOTE — Patient Instructions (Signed)
-   follow up with Dr. Deniece Ree

## 2015-04-19 NOTE — Progress Notes (Signed)
[facility]  PATIENT: Lindsay Clark DOB: 28-Dec-1940  REFERRING CLINICIAN: White  HISTORY FROM: patient  REASON FOR VISIT: follow up   HISTORICAL  CHIEF COMPLAINT:  Chief Complaint  Patient presents with  . Follow-up    11 months    HISTORY OF PRESENT ILLNESS:   UPDATE 04/19/15: Since last visit, has been seen by Dr. Tomi Likens, Dr. Alvy Bimler, Dr. Tommi Rumps, Dr. Mendel Corning, then finally to Dr. Deniece Ree. Ultimately, patient was diagnosed with a monophasic autoimmune inflammatory process. Symptoms overall stable / slightly progressed.   UPDATE 05/11/14: Since last visit, patient has continued with right foot involuntary movements, mild gait difficulty, involuntary movements of right hand, esp with 4th and 5th finger   PRIOR HPI (04/19/14): 74 year old right-handed female with hypertension, chronic dermatitis, here for evaluation of stroke. April 2015 patient was having right hip pain, radiating to the right leg. Symptoms progressively worsened. November 2015 patient had progressive decreased energy, decreased right leg strength, difficulty performing household chores and cleaning. By Thanksgiving 2015 patient was having difficulty with her organizational skills and difficulty preparing Thanksgiving dinner. On 03/23/2014 patient had difficulty playing organ at church. Patient also noted involuntary movements of her right foot during this time. Patient having increasing pressure in her head. 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 Patient had MRI brain, MRA head, carotid ultrasound, echocardiogram which I have reviewed. Patient has been evaluated by physical and occupational therapy as well. Patient now on aspirin 81 mg daily.   REVIEW OF SYSTEMS: Full 14 system review of systems performed and notable only for numbness appetite change agitation walking difficulty.    ALLERGIES: Allergies    Allergen Reactions  . Amlodipine     04/19/15 Patient denies    HOME MEDICATIONS: Outpatient Prescriptions Prior to Visit  Medication Sig Dispense Refill  . ALTACE 5 MG capsule Take 5-15 mg by mouth 2 (two) times daily. Take 15mg  in the morning and 5mg  in the evening.    . clobetasol ointment (TEMOVATE) [id number] % Apply 1 application topically 2 (two) times daily as needed (dry skin).     . Clobetasol Propionate 0.05 % shampoo Apply 1 application topically daily as needed (itching).     . fluocinonide cream (LIDEX) [id number] % Apply 1 application topically daily as needed (itching).     . fluticasone (FLONASE) 50 MCG/ACT nasal spray Place 1 spray into both nostrils as needed for allergies or rhinitis.    Marland Kitchen gabapentin (NEURONTIN) 300 MG capsule Take 300 mg by mouth at bedtime.     . hydrochlorothiazide (HYDRODIURIL) 25 MG tablet Take 25 mg by mouth every morning.     . Multiple Vitamins-Minerals (ONE-A-DAY WOMENS 50 PLUS PO) Take 1 tablet by mouth daily.     . Omega-3 Fatty Acids (FISH OIL) 1000 MG CAPS Take 2 capsules by mouth daily.    . tacrolimus (PROTOPIC) 0.1 % ointment Apply 1 application topically 2 (two) times daily as needed (psoriasis).     . ondansetron (ZOFRAN ODT) 8 MG disintegrating tablet Take 1 tablet (8 mg total) by mouth every 8 (eight) hours as needed for nausea or vomiting. 12 tablet 0   No facility-administered medications prior to visit.    PAST MEDICAL HISTORY: Past Medical History  Diagnosis Date  . Stroke ([place])   . Chronic dermatitis   . Hypertension   . 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  [redacted] week gestation of pregnancy   . Stroke with cerebral ischemia ([place]) 10/20/2014  . 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 Multiple lesions on CT of brain and spine     reports lesions from previous CT scan at [facility]     PAST SURGICAL HISTORY: Past Surgical History  Procedure Laterality Date  . Tubal ligation  1980    FAMILY HISTORY: Family History  Problem  Relation Age of Onset  . Stroke Mother   . Colon cancer Mother   . Heart attack Father   . Emphysema Father   . Thyroid disease Son   . Thyroid disease Maternal Grandmother   . Cancer Maternal Grandmother     cervical   . Thrombosis Maternal Grandfather     SOCIAL HISTORY:  Social History   Social History  . Marital Status: Married    Spouse Name: [name]  . Number of Children: 2  . Years of Education: MAx2   Occupational History  . Retired Other   Social History Main Topics  . Smoking status: Never Smoker   . Smokeless tobacco: Never Used  . Alcohol Use: 0.0 oz/week    0 Standard drinks or equivalent per week     Comment: 4 glasses of wine weekly  . Drug Use: No  . Sexual Activity: No   Other Topics Concern  . Not on file   Social History Narrative   Patient lives at home with her spouse.   Caffeine Use: 2-3 cups daily     PHYSICAL EXAM  Filed Vitals:   04/19/15 1422  BP: 124/73  Pulse: 80  Height: 5\' 5"  (1.651 m)  Weight: 189 lb (85.73 kg)    Body mass index is 31.45 kg/(m^2).  No exam data present  No flowsheet data found.  GENERAL EXAM: well developed, nourished and groomed; neck is supple  CARDIOVASCULAR: Regular rate and rhythm, no murmurs   NEUROLOGIC: MENTAL STATUS: awake, alert, language fluent, comprehension intact, naming intact, fund of knowledge appropriate CRANIAL NERVE: pupils equal and reactive to light, visual fields full to confrontation, extraocular muscles intact, no nystagmus, facial sensation symmetric, DECR RIGHT NL FOLD. Hearing intact, palate elevates symmetrically, uvula midline, shoulder shrug symmetric, tongue midline. MOTOR: normal bulk and tone, full strength in the LUE, LLE; DYSTONIC POSTURING OF RIGHT HAND AND RIGHT FOOT; DECREASED FINGER TAPPING DEXTERITY ON RIGHT SIDE; CHOREIFORM MOVEMENTS IN RIGHT FOOT/ANKLE SENSORY: normal and symmetric to light touch COORDINATION: finger-nose-finger, fine finger movements  SLIGHTLY DYSMETRIC ON RIGHT SIDE REFLEXES: deep tendon reflexes present and symmetric GAIT/STATION: narrow based gait; able to walk on toes, heels; DIFF WITH TANDEM; RIGHT FOOT SUPINATED; DECR RIGHT ARM SWING WITH DYSTONIC POSTURING OF FINGERS    DIAGNOSTIC DATA (LABS, IMAGING, TESTING) - I reviewed patient records, labs, notes, testing and imaging myself where available.  Lab Results  Component Value Date   WBC 7.9 02/07/2015   HGB 13.8 02/07/2015   HCT 41.8 02/07/2015   MCV 90.3 02/07/2015   PLT 200 02/07/2015      Component Value Date/Time   NA 139 02/07/2015 0152   NA 140 10/20/2014 1517   K 3.2* 02/07/2015 0152   K 3.4* 10/20/2014 1517   CL 100* 02/07/2015 0152   CO2 28 02/07/2015 0152   CO2 29 10/20/2014 1517   GLUCOSE 113* 02/07/2015 0152   GLUCOSE 143* 10/20/2014 1517   BUN 14 02/07/2015 0152   BUN 15.1 10/20/2014 1517   CREATININE 0.62 02/07/2015 0152   CREATININE 0.8 10/20/2014 1517  CALCIUM 9.0 02/07/2015 0152   CALCIUM 9.1 10/20/2014 1517   PROT 6.6 10/20/2014 1517   ALBUMIN 3.8 10/20/2014 1517   AST 21 10/20/2014 1517   ALT 23 10/20/2014 1517   ALKPHOS 83 10/20/2014 1517   BILITOT 0.39 10/20/2014 1517   GFRNONAA >60 02/07/2015 0152   GFRAA >60 02/07/2015 0152   No results found for: CHOL, HDL, LDLCALC, LDLDIRECT, TRIG, CHOLHDL No results found for: HGBA1C No results found for: VITAMINB12 No results found for: TSH  I reviewed images myself and agree with interpretation. -VRP  04/05/14 MRI brain - A late subacute LEFT MCA territory infarct. The appearance of this infarct would be consistent with a time course of an event beginning in October. Moderate chronic microvascular ischemic change throughout the white matter without intracranial hemorrhage. The white matter lesions are unlikely to represent a demyelinating process.   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  04/14/14 MRA HEAD 1. No evidence of major intracranial arterial occlusion or significant proximal stenosis. Mild branch vessel irregularity suggestive of atherosclerosis. 2. 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  04/13/14 TTE - Normal LV function; grade 1 diastolic dysfunction; trace MR.   04/14/14 CAROTID U/S 1. Intimal thickening throughout both CCA's. 2. 1-39% bilateral ICA stenosis.  3. Patent vertebral arteries with antegrade flow. 4. Normal subclavian arteries, bilaterally.  06/30/14 MRI brain  - The patient has not undergone evolutionary changes that make the diagnosis of late subacute infarction certain. There has not been the development of volume loss. There may be slightly more abnormal T2 signal within the inferior basal ganglia on the left. Minimal smudgy enhancement at the inferior basal ganglia is present. I think the possibility of late subacute infarction, behaving in an atypical fashion is still possible. However, infiltrating glioma remains a possibility. The pattern is not typical of lymphoma. Unilateral nature would argue against any toxic or metabolic process. Because there has not been pronounced or rapid progressive change, one might consider an additional follow-up in 2- 3 more months.  09/30/14 MRI brain  - Abnormality involving the left lenticular nucleus, left external capsule, portion of the anterior limb of the left internal capsule and the left caudate has not changed since December 2015. This is felt unlikely to represent result of subacute infarct. This may represent tumor such as lymphoma or low grade glioma. Result of demyelinating process, infectious process (progressive multifocal leukoencephalopathy, tuberculosis), vasculitis or inflammatory process (sarcoidosis, Bechet's disease) are felt to be less likely considerations. - Additionally, patient has developed a 6 x 4 x 3 mm nodular area of enhancement within the left cerebellum and  therefore spread of tumor is a possibility. Spread of inflammatory process or less likely infectious process also not excluded. Laboratory analysis of cerebral spinal fluid may prove helpful for further delineation.  - No significant change in the incidentally noted right frontal developmental venous anomaly. - Moderate punctate and patchy white matter type changes may reflect result of small vessel disease. - Small right vertebral artery ends in a posterior inferior cerebellar artery distribution. - Mild narrowing upper cervical spine C3-4 and C4-5 level.    ASSESSMENT AND PLAN  74 y.o. year old female here with gradual onset of right hand dystonia, right foot/ankle chorea, with left basal ganglia / putaminal / caudate MRI abnl, initially thought to be stroke related, but now felt to be related to monophasic autoimmune inflamm condition.   PLAN: - follow  up with Dr. Deniece Ree ([facility])  Return for return to PCP.    Penni Bombard, MD [id number], 0000000 PM Certified in Neurology, Neurophysiology and Neuroimaging  [facility] [address], [place], [place] [address] [phone]

## 2015-04-26 ENCOUNTER — Encounter: Payer: Self-pay | Admitting: Physical Therapy

## 2015-04-26 NOTE — Therapy (Signed)
[facility] [address] [place], [place], [address] Phone: [phone]   Fax:  [phone]  Patient Details  Name: Lindsay Clark MRN: [id number] Date of Birth: 1940-08-09 Referring Provider:  No ref. provider found  Encounter Date: 05/05/2014   PHYSICAL THERAPY DISCHARGE SUMMARY  Visits from Start of Care: 10  Current functional level related to goals / functional outcomes:  PT LONG TERM GOAL #1   Title verbalizes CVA risk factors & signs/symptoms (Target Date: 06/04/14)   Baseline MET 1/121/6   Time 8   Period Weeks   Status Achieved   PT LONG TERM GOAL #2   Title verbalizes / demonstrates HEP / ongoing fitness plan correctly. (Target Date: 05/07/14)   Baseline MET 05/04/14    Time 8   Period Weeks   Status Achieved   PT LONG TERM GOAL #3   Title Dynamic Gait Index >/= 19/24 (Target Date: 05/07/14)   Baseline Dynamic Gait Index 23/24   Time 8   Period Weeks   Status Achieved   PT LONG TERM GOAL #4   Title ambulates 500' including grass, ramp, curb without device independently. (Target Date: 05/07/14)   Baseline MET 05/04/14   Time 8   Period Weeks   Status Achieved            Remaining deficits: See above   Education / Equipment: HEP / fitness plan Plan: Patient agrees to discharge.  Patient goals were met. Patient is being discharged due to meeting the stated rehab goals.  ?????        Delila Kuklinski PT, DPT 04/26/2015, 8:31 AM  [facility] [address] [place], [place], [address] Phone: [phone]   Fax:  [phone]

## 2015-05-04 ENCOUNTER — Ambulatory Visit: Payer: Medicare Other | Attending: Psychiatry | Admitting: Physical Therapy

## 2015-05-04 ENCOUNTER — Encounter: Payer: Self-pay | Admitting: Physical Therapy

## 2015-05-04 DIAGNOSIS — R269 Unspecified abnormalities of gait and mobility: Secondary | ICD-10-CM

## 2015-05-04 DIAGNOSIS — R29818 Other symptoms and signs involving the nervous system: Secondary | ICD-10-CM | POA: Insufficient documentation

## 2015-05-04 DIAGNOSIS — R531 Weakness: Secondary | ICD-10-CM | POA: Diagnosis present

## 2015-05-04 DIAGNOSIS — R278 Other lack of coordination: Secondary | ICD-10-CM

## 2015-05-04 DIAGNOSIS — F4322 Adjustment disorder with anxiety: Secondary | ICD-10-CM | POA: Insufficient documentation

## 2015-05-04 DIAGNOSIS — R2681 Unsteadiness on feet: Secondary | ICD-10-CM | POA: Insufficient documentation

## 2015-05-04 DIAGNOSIS — R2689 Other abnormalities of gait and mobility: Secondary | ICD-10-CM

## 2015-05-05 NOTE — Therapy (Signed)
[facility] [address] [place], [place], [address] Phone: [phone]   Fax:  [phone]  Physical Therapy Evaluation  Patient Details  Name: Lindsay Clark MRN: [id number] Date of Birth: 30-Jul-1940 Referring Provider: Nedra Hai, MD  Encounter Date: 05/04/2015      PT End of Session - 05/04/15 1145    Visit Number 1   Number of Visits 9   Date for PT Re-Evaluation 07/01/15   Authorization Type Medicare G-code & Progress Note every 10 visits   PT Start Time [id number]   PT Stop Time 1100   PT Time Calculation (min) 45 min   Equipment Utilized During Treatment Gait belt   Activity Tolerance Patient tolerated treatment well   Behavior During Therapy [facility] for tasks assessed/performed      Past Medical History  Diagnosis Date  . Stroke ([place])   . Chronic dermatitis   . Hypertension   . 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 [redacted] week gestation of pregnancy   . Stroke with cerebral ischemia ([place]) 10/20/2014  . Lymphoma ([place]) 10/20/2014  . Multiple lesions on CT of brain and spine     reports lesions from previous [facility]     Past Surgical History  Procedure Laterality Date  . Tubal ligation  1980    There were no vitals filed for this visit.  Visit Diagnosis:  Abnormality of gait  Weakness generalized  Unsteadiness  Balance problems  Coordination abnormal      Subjective Assessment - 05/04/15 1035    Subjective This 75yo female initially had symptoms of right side weakness / ataxia October 2015 that was diagnosed as CVA. 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 She reports she knows that it "burned out" of her body on 03/30/2014. She is having progressive issues with right UE & LE chorea, dystonia, ataxic movements limiting her balance and gait. She presents to PT  for evaluation and skilled instruction in  progressive exercises & activities to improve her mobility and balance.    Patient Stated Goals Wants to improve her function with her medical condition   Currently in Pain? No/denies            [facility] PT Assessment - 05/04/15 1015    Assessment   Medical Diagnosis Autoimmune Encephalopathy   Referring Provider Nedra Hai, MD   Onset Date/Surgical Date 03/31/15  diagnosed but symptoms Oct. 2015   Hand Dominance Right   Precautions   Precautions Fall   Restrictions   Weight Bearing Restrictions No   Balance Screen   Has the patient fallen in the past 6 months No   Has the patient had a decrease in activity level because of a fear of falling?  No   Is the patient reluctant to leave their home because of a fear of falling?  No   Home Environment   Living Environment Private residence   Living Arrangements Spouse/significant other  husband had back surgery 02/22/15   Type of [place] to enter   Entrance Stairs-Number of Steps 2   Entrance Stairs-Rails Left   Home Layout One level   Prior Function   Level of Independence Independent;Independent with gait;Independent with homemaking with ambulation;Independent with community mobility without device;Independent with household mobility without device   Observation/Other Assessments   Focus on Therapeutic Outcomes (FOTO)  58.96 Functional Status  53 Risk  Adjusted   Activities of Balance Confidence Scale (ABC Scale)  86.9%   Fear Avoidance Belief Questionnaire (FABQ)  19 (5)   Coordination   Finger Nose Finger Test RUE dysmetria & ataxia   Heel Shin Test RLE dysmetria & ataxia   Posture/Postural Control   Posture/Postural Control Postural limitations   Postural Limitations Weight shift left;Flexed trunk;Rounded Shoulders;Forward head   Posture Comments Right LE externally rotates, supinates and knee flexes w/time causing increased wt shift left and balance loss   Tone   Assessment Location Right Lower  Extremity;Right Upper Extremity;Other (comment)   Tone Assessment - Other   Other Tone Location Right UE & LE   Other Tone Location Comments Right UE increased tone in flexion & pronation; LE increased tone with flexion synergy movements.    ROM / Strength   AROM / PROM / Strength AROM;Strength   AROM   Overall AROM  Within functional limits for tasks performed   Strength   Overall Strength Deficits   Strength Assessment Site Hip;Knee;Ankle   Right/Left Hip Right;Left   Right Hip Flexion 4+/5   Right Hip Extension 3-/5   Right Hip External Rotation  4+/5   Right Hip Internal Rotation 4-/5   Right Hip ABduction 4-/5   Left Hip Flexion 5/5   Left Hip Extension 3+/5   Left Hip External Rotation 5/5   Left Hip Internal Rotation 5/5   Left Hip ABduction 4+/5   Right/Left Knee Right;Left   Right Knee Flexion 4-/5   Right Knee Extension 4+/5   Left Knee Flexion 5/5   Left Knee Extension 5/5   Right/Left Ankle Right   Right Ankle Dorsiflexion 4+/5   Right Ankle Eversion 3/5   Ambulation/Gait   Ambulation/Gait Yes   Ambulation/Gait Assistance 5: Supervision   Ambulation Distance (Feet) 300 Feet   Assistive device None   Gait Pattern Ataxic;Poor foot clearance - right;Abducted- right;Trunk flexed;Trunk rotated posteriorly on right;Decreased weight shift to right;Decreased stance time - right;Decreased step length - right;Decreased arm swing - right;Step-through pattern   Ambulation Surface Indoor;Level   Gait velocity 2.19 ft/sec   Stairs Yes   Stairs Assistance 5: Supervision   Stair Management Technique One rail Right;Alternating pattern;Forwards   Number of Stairs 4   Standardized Balance Assessment   Standardized Balance Assessment Berg Balance Test;Timed Up and Go Test   Berg Balance Test   Sit to Stand Able to stand without using hands and stabilize independently   Standing Unsupported Able to stand safely 2 minutes   Sitting with Back Unsupported but Feet Supported on  Floor or Stool Able to sit safely and securely 2 minutes   Stand to Sit Sits safely with minimal use of hands   Transfers Able to transfer safely, minor use of hands   Standing Unsupported with Eyes Closed Able to stand 10 seconds safely   Standing Ubsupported with Feet Together Able to place feet together independently and stand 1 minute safely  prefers knees touching to stabilze LEs   From Standing, Reach Forward with Outstretched Arm Can reach forward >5 cm safely (2")   From Standing Position, Pick up Object from Floor Able to pick up shoe, needs supervision   From Standing Position, Turn to Look Behind Over each Shoulder Looks behind one side only/other side shows less weight shift   Turn 360 Degrees Needs close supervision or verbal cueing   Standing Unsupported, Alternately Place Feet on Step/Stool Able to complete 4 steps without aid or supervision  Standing Unsupported, One Foot in Front Able to take small step independently and hold 30 seconds   Standing on One Leg Tries to lift leg/unable to hold 3 seconds but remains standing independently   Total Score 42   Timed Up and Go Test   Normal TUG (seconds) 5.93   Cognitive TUG (seconds) 8.06   Functional Gait  Assessment   Gait assessed  Yes   Gait Level Surface Walks 20 ft, slow speed, abnormal gait pattern, evidence for imbalance or deviates 10-15 in outside of the 12 in walkway width. Requires more than 7 sec to ambulate 20 ft.   Change in Gait Speed Makes only minor adjustments to walking speed, or accomplishes a change in speed with significant gait deviations, deviates 10-15 in outside the 12 in walkway width, or changes speed but loses balance but is able to recover and continue walking.   Gait with Horizontal Head Turns Performs head turns with moderate changes in gait velocity, slows down, deviates 10-15 in outside 12 in walkway width but recovers, can continue to walk.   Gait with Vertical Head Turns Performs task with  moderate change in gait velocity, slows down, deviates 10-15 in outside 12 in walkway width but recovers, can continue to walk.   Gait and Pivot Turn Pivot turns safely in greater than 3 sec and stops with no loss of balance, or pivot turns safely within 3 sec and stops with mild imbalance, requires small steps to catch balance.   Step Over Obstacle Is able to step over one shoe box (4.5 in total height) but must slow down and adjust steps to clear box safely. May require verbal cueing.   Gait with Narrow Base of Support Ambulates less than 4 steps heel to toe or cannot perform without assistance.   Gait with Eyes Closed Walks 20 ft, slow speed, abnormal gait pattern, evidence for imbalance, deviates 10-15 in outside 12 in walkway width. Requires more than 9 sec to ambulate 20 ft.   Ambulating Backwards Walks 20 ft, slow speed, abnormal gait pattern, evidence for imbalance, deviates 10-15 in outside 12 in walkway width.   Steps Alternating feet, must use rail.   Total Score 11   RUE Tone   RUE Tone Brunnstrom Scale;Other (Comment)   RLE Tone   RLE Tone Brunnstrom Scale                             PT Short Term Goals - 05/04/15 1100    PT SHORT TERM GOAL #1   Title demonstrates initial HEP correctly. (Target Date: 06/03/2015)   Time 4   Period Weeks   Status New   PT SHORT TERM GOAL #2   Title ambulates 500' without device scanning environment with balance loss.  (Target Date: 06/03/2015)   Time 4   Period Weeks   Status New   PT SHORT TERM GOAL #3   Title Berg Balance >46/56  (Target Date: 06/03/2015)   Time 4   Period Weeks   Status New   PT SHORT TERM GOAL #4   Title Patient verbalizes understanding of orthotic recommendation.  (Target Date: 06/03/2015)   Time 4   Period Weeks   Status New           PT Long Term Goals - 05/04/15 1100    PT LONG TERM GOAL #1   Title Patient ambulates 1000' outside including ramps, curbs, grass without device with AFO  independently.   (Target Date: 07/01/2015)   Time 8   Period Weeks   Status New   PT LONG TERM GOAL #2   Title verbalizes / demonstrates HEP / ongoing fitness plan correctly.  (Target Date: 07/01/2015)   Time 8   Period Weeks   Status New   PT LONG TERM GOAL #3   Title Functional Gait Assessment >/= 20/30 to indicate lower fall risk   (Target Date: 07/01/2015)   Time 8   Period Weeks   Status New   PT LONG TERM GOAL #4   Title Functional Status self report improves by 5 points.   (Target Date: 07/01/2015)   Time 8   Period Weeks   Status New   PT LONG TERM GOAL #5   Title Gait Velocity >2.62 ft/sec to indicate community mobility.   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 She presents with RLE weakness, RUE/RLE dyscoordination, impaired gait with deviations place her at risk of falls or injury especially ankle sprain or fracture, and impaired Balance with Merrilee Jansky 42/56 and Functional Gait Assessment 11/30. Patient would benefit from skilled care to improve safety with gait & balance along with appropriate exercises. Patient condition is evolving and moderate complexity.     Pt will benefit from skilled therapeutic intervention in order to improve on the following deficits Abnormal gait;Decreased activity tolerance;Decreased balance;Decreased coordination;Decreased knowledge of use of DME;Decreased mobility;Decreased strength;Postural dysfunction   Rehab Potential Good   PT Frequency 1x / week   PT Duration 8 weeks   PT Treatment/Interventions ADLs/Self Care Home Management;Functional mobility training;Stair training;Gait training;DME Instruction;Therapeutic activities;Therapeutic exercise;Balance training;Neuromuscular re-education;Patient/family education;Orthotic  Fit/Training   PT Next Visit Plan Assess gait & balance with [place] AFOwith T strap, initiate HEP for LE strength   Recommended Other Services AFO   Consulted and Agree with Plan of Care Patient          G-Codes - 05-21-2015 1100    Functional Assessment Tool Used Functional Gait Assessment 11/30   Functional Limitation Mobility: Walking and moving around   Mobility: Walking and Moving Around Current Status [phone]) At least 60 percent but less than 80 percent impaired, limited or restricted   Mobility: Walking and Moving Around Goal Status [phone]) At least 20 percent but less than 40 percent impaired, limited or restricted       Problem List Patient Active Problem List   Diagnosis Date Noted  . Coronary artery calcification of native artery 11/04/2014  . Abnormal finding on MRI of brain 10/04/2014    Ayomide Zuleta PT, DPT 05/05/2015, 1:09 PM  [facility] [address] [place], [place], [address] Phone: [phone]   Fax:  [phone]  Name: Lindsay Clark MRN: [id number] Date of Birth: [birth year]

## 2015-05-09 ENCOUNTER — Encounter: Payer: Self-pay | Admitting: Physical Therapy

## 2015-05-09 ENCOUNTER — Ambulatory Visit: Payer: Medicare Other | Admitting: Physical Therapy

## 2015-05-09 DIAGNOSIS — R269 Unspecified abnormalities of gait and mobility: Secondary | ICD-10-CM | POA: Diagnosis not present

## 2015-05-09 DIAGNOSIS — R278 Other lack of coordination: Secondary | ICD-10-CM

## 2015-05-09 DIAGNOSIS — R2681 Unsteadiness on feet: Secondary | ICD-10-CM

## 2015-05-09 DIAGNOSIS — R2689 Other abnormalities of gait and mobility: Secondary | ICD-10-CM

## 2015-05-09 DIAGNOSIS — R531 Weakness: Secondary | ICD-10-CM

## 2015-05-09 NOTE — Therapy (Signed)
[facility] [address] [place], [place], [address] Phone: [phone]   Fax:  [phone]  Physical Therapy Treatment  Patient Details  Name: Lindsay Clark MRN: [id number] Date of Birth: June 06, 1940 Referring Provider: Nedra Hai, MD  Encounter Date: 05/09/2015      PT End of Session - 05/09/15 1100    Visit Number 2   Number of Visits 9   Date for PT Re-Evaluation 07/01/15   Authorization Type Medicare G-code & Progress Note every 10 visits   PT Start Time 1020   PT Stop Time 1103   PT Time Calculation (min) 43 min   Activity Tolerance Patient tolerated treatment well   Behavior During Therapy [facility] for tasks assessed/performed      Past Medical History  Diagnosis Date  . Stroke ([place])   . Chronic dermatitis   . Hypertension   . 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 [redacted] week gestation of pregnancy   . Stroke with cerebral ischemia ([place]) 10/20/2014  . Lymphoma ([place]) 10/20/2014  . Multiple lesions on CT of brain and spine     reports lesions from previous CT scan at [facility]     Past Surgical History  Procedure Laterality Date  . Tubal ligation  1980    There were no vitals filed for this visit.  Visit Diagnosis:  Abnormality of gait  Weakness generalized  Unsteadiness  Coordination abnormal  Balance problems      Subjective Assessment - 05/09/15 1020    Subjective She reports she wants to look at options of brace to control right LE.    Currently in Pain? No/denies     Therapeutic Exercise: Standing alternate tapping 8" cone with cues to place foot with neutral rotation when placing foot back on floor. PT instructed patient that rotation is controlled at hip & supination/ inversion is controlled at ankle. Patient reports she does not feel this is true even though other PTs have told her this too.  Orthotic Fit/Training: Malena Edman orthotist present during session. He  brought [place] Walk-On AFO with T-strap per PT request. AFO fit to patient. Patient's ankle / foot & knee controlled in neutral in sitting with clonic activity. Patient ambulated 20' X 2 with supination control and knee but high pressure on medial aspect of foot due to pronation. PT and orthotist instructed / explained options & benefits of custom AFO. Patient to set up appt at orthotist office to trial foot orthotic simulation in AFO to determine ankle control without medial pressure. Patient verbalized understanding and agreement. [name] to check what her insurance covers with custom AFO.                               PT Short Term Goals - 05/04/15 1100    PT SHORT TERM GOAL #1   Title demonstrates initial HEP correctly. (Target Date: 06/03/2015)   Time 4   Period Weeks   Status New   PT SHORT TERM GOAL #2   Title ambulates 500' without device scanning environment with balance loss.  (Target Date: 06/03/2015)   Time 4   Period Weeks   Status New   PT SHORT TERM GOAL #3   Title Berg Balance >46/56  (Target Date: 06/03/2015)   Time 4   Period Weeks   Status New   PT SHORT TERM GOAL #4   Title Patient verbalizes understanding of  orthotic recommendation.  (Target Date: 06/03/2015)   Time 4   Period Weeks   Status New           PT Long Term Goals - 05/04/15 1100    PT LONG TERM GOAL #1   Title Patient ambulates 1000' outside including ramps, curbs, grass without device with AFO independently.   (Target Date: 07/01/2015)   Time 8   Period Weeks   Status New   PT LONG TERM GOAL #2   Title verbalizes / demonstrates HEP / ongoing fitness plan correctly.  (Target Date: 07/01/2015)   Time 8   Period Weeks   Status New   PT LONG TERM GOAL #3   Title Functional Gait Assessment >/= 20/30 to indicate lower fall risk   (Target Date: 07/01/2015)   Time 8   Period Weeks   Status New   PT LONG TERM GOAL #4   Title Functional Status self report improves by 5  points.   (Target Date: 07/01/2015)   Time 8   Period Weeks   Status New   PT LONG TERM GOAL #5   Title Gait Velocity >2.62 ft/sec to indicate community mobility.   (Target Date: 07/01/2015)   Time 8   Period Weeks   Status New               Plan - 05/09/15 1330    Clinical Impression Statement AFO appeared to control supination in non-weight bearing standing /sitting and knee flexion / varus moment but her foot overpronates in stance so pressure on strut would probably lead to a blister. Patient appears would benefit from a custom AFO to control both supinaton & pronation and limit pressure points.    Pt will benefit from skilled therapeutic intervention in order to improve on the following deficits Abnormal gait;Decreased activity tolerance;Decreased balance;Decreased coordination;Decreased knowledge of use of DME;Decreased mobility;Decreased strength;Postural dysfunction   Rehab Potential Good   PT Frequency 1x / week   PT Duration 8 weeks   PT Treatment/Interventions ADLs/Self Care Home Management;Functional mobility training;Stair training;Gait training;DME Instruction;Therapeutic activities;Therapeutic exercise;Balance training;Neuromuscular re-education;Patient/family education;Orthotic Fit/Training   PT Next Visit Plan initiate HEP for LE strength in prone including coordination   Consulted and Agree with Plan of Care Patient        Problem List Patient Active Problem List   Diagnosis Date Noted  . Coronary artery calcification of native artery 11/04/2014  . Abnormal finding on MRI of brain 10/04/2014    Marlet Korte PT, DPT 05/09/2015, 7:36 PM  [facility] [address] [place], [place], [address] Phone: [phone]   Fax:  [phone]  Name: Lindsay Clark MRN: [id number] Date of Birth: 08-Jun-1940

## 2015-05-16 ENCOUNTER — Encounter: Payer: Self-pay | Admitting: Physical Therapy

## 2015-05-16 ENCOUNTER — Ambulatory Visit: Payer: Medicare Other | Admitting: Physical Therapy

## 2015-05-16 DIAGNOSIS — R531 Weakness: Secondary | ICD-10-CM

## 2015-05-16 DIAGNOSIS — R2681 Unsteadiness on feet: Secondary | ICD-10-CM

## 2015-05-16 DIAGNOSIS — R2689 Other abnormalities of gait and mobility: Secondary | ICD-10-CM

## 2015-05-16 DIAGNOSIS — R269 Unspecified abnormalities of gait and mobility: Secondary | ICD-10-CM

## 2015-05-16 DIAGNOSIS — R278 Other lack of coordination: Secondary | ICD-10-CM

## 2015-05-16 NOTE — Therapy (Signed)
[facility] [address] [place], [place], [address] Phone: [phone]   Fax:  [phone]  Physical Therapy Treatment  Patient Details  Name: Lindsay Clark MRN: [id number] Date of Birth: 1941/01/10 Referring Provider: Nedra Hai, MD  Encounter Date: 05/16/2015      PT End of Session - 05/16/15 1100    Visit Number 3   Number of Visits 9   Date for PT Re-Evaluation 07/01/15   Authorization Type Medicare G-code & Progress Note every 10 visits   PT Start Time [id number]   PT Stop Time 1100   PT Time Calculation (min) 45 min   Activity Tolerance Patient tolerated treatment well   Behavior During Therapy [facility] for tasks assessed/performed      Past Medical History  Diagnosis Date  . Stroke ([place])   . Chronic dermatitis   . Hypertension   . 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 [redacted] week gestation of pregnancy   . Stroke with cerebral ischemia ([place]) 10/20/2014  . Lymphoma ([place]) 10/20/2014  . Multiple lesions on CT of brain and spine     reports lesions from previous CT scan at [facility]     Past Surgical History  Procedure Laterality Date  . Tubal ligation  1980    There were no vitals filed for this visit.  Visit Diagnosis:  Abnormality of gait  Weakness generalized  Unsteadiness  Coordination abnormal  Balance problems      Subjective Assessment - 05/16/15 1020    Subjective She saw orthotist on Thursday and he recommended her ASO correctly and order an AFO noodle.    Currently in Pain? No/denies     Therapeutic Exercise KNEE: Flexion - Prone    Bend knee. Raise heel toward buttocks. Do not raise hips. _10__ reps per set   Copyright  VHI. All rights reserved.  Hip External Rotation (Eccentric) - Prone    Lie on stomach with one knee flexed to 90. Slowly lower leg inward for 3-5 seconds. Return to start position quickly. _10__ reps per set   http://ecce.exer.us/83    Copyright  VHI. All rights reserved.  Hip Internal Rotation (Eccentric) - Prone, Knees Flexed to 90 Degrees    Lie on stomach with knees flexed to 90. Slowly lower legs outward for 3-5 seconds. Do not move knees. Return to start quickly. _10__ reps per set  http://ecce.exer.us/79   Copyright  VHI. All rights reserved.  Abduction: "Doggie Ambulance person" (Eccentric) - All 4's    On hands and knees, lift knee out to side. Slowly lower for 3-5 seconds. __10_ reps per set  http://ecce.exer.us/67   Copyright  VHI. All rights reserved.  Hip Extension (All-Fours)    Lift right leg back with knee slightly flexed. Do not arch neck or back. Repeat __10__ times per set  http://orth.exer.us/107   Copyright  VHI. All rights reserved.  Upper Body Extension (All-Fours)    Raise right arm in front. Do not arch neck. Be sure to keep back flat. Repeat __10__ times per set.   http://orth.exer.us/109   Copyright  VHI. All rights reserved.  Upper / Lower Extremity Extension (All-Fours)    Tighten stomach and raise right leg and opposite arm. Keep trunk rigid. Repeat __10__ times per set.   http://orth.exer.us/1119   Copyright  VHI. All rights reserved.  Abduction: Clam (Eccentric) - Side-Lying    Lie on side with knees bent. Lift top knee, keeping feet together. Keep trunk steady.  Slowly lower for 3-5 seconds. _10__ reps per set  http://ecce.exer.us/65   Copyright  VHI. All rights reserved.  HIP: Abduction - Side-Lying    Lie on side, legs straight and in line with trunk. Squeeze glutes. Raise top leg up and slightly back. Point toes forward. __10_ reps per set [facility] bottom leg to stabilize pelvis.  Copyright  VHI. All rights reserved.  Toe Pick-Up    Use toes of left foot to pick up objects from floor, such as Marbles or tissue and place in opposite hand. Repeat with right foot.  Repeat __10__ times per set.   http://orth.exer.us/49                               PT Education - 05/16/15 1015    Education provided Yes   Education Details HEP prone knee flexion including rotation, sidelying abduction, quaadriped UE/LE lifts, lifting tissue from floor and reaching with UEs   Person(s) Educated Patient   Methods Explanation;Demonstration;Tactile cues;Verbal cues;Handout   Comprehension Verbalized understanding;Returned demonstration;Verbal cues required;Tactile cues required;Need further instruction          PT Short Term Goals - 05/04/15 1100    PT SHORT TERM GOAL #1   Title demonstrates initial HEP correctly. (Target Date: 06/03/2015)   Time 4   Period Weeks   Status New   PT SHORT TERM GOAL #2   Title ambulates 500' without device scanning environment with balance loss.  (Target Date: 06/03/2015)   Time 4   Period Weeks   Status New   PT SHORT TERM GOAL #3   Title Berg Balance >46/56  (Target Date: 06/03/2015)   Time 4   Period Weeks   Status New   PT SHORT TERM GOAL #4   Title Patient verbalizes understanding of orthotic recommendation.  (Target Date: 06/03/2015)   Time 4   Period Weeks   Status New           PT Long Term Goals - 05/04/15 1100    PT LONG TERM GOAL #1   Title Patient ambulates 1000' outside including ramps, curbs, grass without device with AFO independently.   (Target Date: 07/01/2015)   Time 8   Period Weeks   Status New   PT LONG TERM GOAL #2   Title verbalizes / demonstrates HEP / ongoing fitness plan correctly.  (Target Date: 07/01/2015)   Time 8   Period Weeks   Status New   PT LONG TERM GOAL #3   Title Functional Gait Assessment >/= 20/30 to indicate lower fall risk   (Target Date: 07/01/2015)   Time 8   Period Weeks   Status New   PT LONG TERM GOAL #4   Title Functional Status self report improves by 5 points.   (Target Date: 07/01/2015)   Time 8   Period Weeks   Status New   PT LONG TERM GOAL #5   Title Gait Velocity >2.62 ft/sec to indicate  community mobility.   (Target Date: 07/01/2015)   Time 8   Period Weeks   Status New               Plan - 05/16/15 1100    Clinical Impression Statement Patient appears to understand HEP for prone, quadriped and sidelying exercises with multi-purpose of strength, coordination & function. Patient reports muscles were fatigued by today's exercises.    Pt will benefit from skilled therapeutic intervention in order to improve  on the following deficits Abnormal gait;Decreased activity tolerance;Decreased balance;Decreased coordination;Decreased knowledge of use of DME;Decreased mobility;Decreased strength;Postural dysfunction   Rehab Potential Good   PT Frequency 1x / week   PT Duration 8 weeks   PT Treatment/Interventions ADLs/Self Care Home Management;Functional mobility training;Stair training;Gait training;DME Instruction;Therapeutic activities;Therapeutic exercise;Balance training;Neuromuscular re-education;Patient/family education;Orthotic Fit/Training   PT Next Visit Plan Review HEP instructed & add ankle exercises   Consulted and Agree with Plan of Care Patient        Problem List Patient Active Problem List   Diagnosis Date Noted  . Coronary artery calcification of native artery 11/04/2014  . Abnormal finding on MRI of brain 10/04/2014    Jamey Reas PT, DPT 05/16/2015, 9:18 PM  [facility] [address] [place], [place], [address] Phone: [phone]   Fax:  [phone]  Name: Lindsay Clark MRN: [id number] Date of Birth: [birth year]

## 2015-05-16 NOTE — Patient Instructions (Addendum)
KNEE: Flexion - Prone    Bend knee. Raise heel toward buttocks. Do not raise hips. _10__ reps per set   Copyright  VHI. All rights reserved.  Hip External Rotation (Eccentric) - Prone    Lie on stomach with one knee flexed to 90. Slowly lower leg inward for 3-5 seconds. Return to start position quickly. _10__ reps per set   http://ecce.exer.us/83   Copyright  VHI. All rights reserved.  Hip Internal Rotation (Eccentric) - Prone, Knees Flexed to 90 Degrees    Lie on stomach with knees flexed to 90. Slowly lower legs outward for 3-5 seconds. Do not move knees. Return to start quickly. _10__ reps per set  http://ecce.exer.us/79   Copyright  VHI. All rights reserved.  Abduction: "Doggie Ambulance person" (Eccentric) - All 4's    On hands and knees, lift knee out to side. Slowly lower for 3-5 seconds. __10_ reps per set http://ecce.exer.us/67   Copyright  VHI. All rights reserved.  Hip Extension (All-Fours)    Lift right leg back with knee slightly flexed. Do not arch neck or back. Repeat __10__ times per set  http://orth.exer.us/107   Copyright  VHI. All rights reserved.  Upper Body Extension (All-Fours)    Raise right arm in front. Do not arch neck. Be sure to keep back flat. Repeat __10__ times per set.   http://orth.exer.us/109   Copyright  VHI. All rights reserved.  Upper / Lower Extremity Extension (All-Fours)    Tighten stomach and raise right leg and opposite arm. Keep trunk rigid. Repeat __10__ times per set.   http://orth.exer.us/1119   Copyright  VHI. All rights reserved.  Abduction: Clam (Eccentric) - Side-Lying    Lie on side with knees bent. Lift top knee, keeping feet together. Keep trunk steady. Slowly lower for 3-5 seconds. _10__ reps per set http://ecce.exer.us/65   Copyright  VHI. All rights reserved.  HIP: Abduction - Side-Lying    Lie on side, legs straight and in line with trunk. Squeeze glutes. Raise top leg up and  slightly back. Point toes forward. __10_ reps per set [facility] bottom leg to stabilize pelvis.  Copyright  VHI. All rights reserved.  Toe Pick-Up    Use toes of left foot to pick up objects from floor, such as  Marbles or tissue and place in opposite hand. Repeat with right foot.  Repeat __10__ times per set.   http://orth.exer.us/49   Copyright  VHI. All rights reserved.

## 2015-05-23 ENCOUNTER — Ambulatory Visit: Payer: Medicare Other | Admitting: Physical Therapy

## 2015-05-23 ENCOUNTER — Encounter: Payer: Self-pay | Admitting: Physical Therapy

## 2015-05-23 DIAGNOSIS — R269 Unspecified abnormalities of gait and mobility: Secondary | ICD-10-CM | POA: Diagnosis not present

## 2015-05-23 DIAGNOSIS — F4322 Adjustment disorder with anxiety: Secondary | ICD-10-CM

## 2015-05-23 DIAGNOSIS — R2689 Other abnormalities of gait and mobility: Secondary | ICD-10-CM

## 2015-05-23 DIAGNOSIS — R2681 Unsteadiness on feet: Secondary | ICD-10-CM

## 2015-05-23 DIAGNOSIS — R278 Other lack of coordination: Secondary | ICD-10-CM

## 2015-05-23 DIAGNOSIS — R531 Weakness: Secondary | ICD-10-CM

## 2015-05-23 NOTE — Therapy (Signed)
[facility] [address] [place], [place], [address] Phone: [phone]   Fax:  [phone]  Physical Therapy Treatment  Patient Details  Name: LYNDEE KASZUBA MRN: [id number] Date of Birth: 08-Jun-1940 Referring Provider: Nedra Hai, MD  Encounter Date: 05/23/2015      PT End of Session - 05/23/15 1100    Visit Number 4   Number of Visits 9   Date for PT Re-Evaluation 07/01/15   Authorization Type Medicare G-code & Progress Note every 10 visits   PT Start Time 1020   PT Stop Time 1105   PT Time Calculation (min) 45 min   Activity Tolerance Patient tolerated treatment well   Behavior During Therapy [facility] for tasks assessed/performed      Past Medical History  Diagnosis Date  . Stroke ([place])   . Chronic dermatitis   . Hypertension   . 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 [redacted] week gestation of pregnancy   . Stroke with cerebral ischemia ([place]) 10/20/2014  . Lymphoma ([place]) 10/20/2014  . Multiple lesions on CT of brain and spine     reports lesions from previous CT scan at [facility]     Past Surgical History  Procedure Laterality Date  . Tubal ligation  1980    There were no vitals filed for this visit.  Visit Diagnosis:  Abnormality of gait  Weakness generalized  Unsteadiness  Coordination abnormal  Balance problems  Adjustment reaction with anxious mood      Subjective Assessment - 05/23/15 1023    Subjective She had tightness in right buttocks after exercising. No pain since Friday.    Currently in Pain? No/denies     Neuromuscular Re-Education: Cues on coordination using targets and posture. Standing PF with focus on RLE matching LLE Standing DF with feet even & right foot forward with cues on weight shift. Sitting with knee extended DF/PF with inversion & eversion to target Knee to chest stretch holding 30 seconds 2 reps Piriformis Stretch 3o seconds 2 reps Lateral wt  shift pelvic tilts Swiss ball - lateral weight shifts & pelvic anterior/posterior tilts, alternate toe tapping, alternate heel raises, alternate arm raises, simulated piano playing. PT instructed in safe set-up & 75cm  Ball for her size. Pt verbalized understanding.                              PT Education - 05/23/15 1100    Education provided Yes   Education Details Reviewed older exercises in her book and instructed how to incoorporate coordination    Person(s) Educated Patient   Methods Explanation;Demonstration;Tactile cues;Verbal cues;Handout   Comprehension Verbalized understanding;Returned demonstration;Verbal cues required;Tactile cues required;Need further instruction          PT Short Term Goals - 05/04/15 1100    PT SHORT TERM GOAL #1   Title demonstrates initial HEP correctly. (Target Date: 06/03/2015)   Time 4   Period Weeks   Status New   PT SHORT TERM GOAL #2   Title ambulates 500' without device scanning environment with balance loss.  (Target Date: 06/03/2015)   Time 4   Period Weeks   Status New   PT SHORT TERM GOAL #3   Title Berg Balance >46/56  (Target Date: 06/03/2015)   Time 4   Period Weeks   Status New   PT SHORT TERM GOAL #4   Title Patient verbalizes understanding of orthotic  recommendation.  (Target Date: 06/03/2015)   Time 4   Period Weeks   Status New           PT Long Term Goals - 05/04/15 1100    PT LONG TERM GOAL #1   Title Patient ambulates 1000' outside including ramps, curbs, grass without device with AFO independently.   (Target Date: 07/01/2015)   Time 8   Period Weeks   Status New   PT LONG TERM GOAL #2   Title verbalizes / demonstrates HEP / ongoing fitness plan correctly.  (Target Date: 07/01/2015)   Time 8   Period Weeks   Status New   PT LONG TERM GOAL #3   Title Functional Gait Assessment >/= 20/30 to indicate lower fall risk   (Target Date: 07/01/2015)   Time 8   Period Weeks   Status New   PT  LONG TERM GOAL #4   Title Functional Status self report improves by 5 points.   (Target Date: 07/01/2015)   Time 8   Period Weeks   Status New   PT LONG TERM GOAL #5   Title Gait Velocity >2.62 ft/sec to indicate community mobility.   (Target Date: 07/01/2015)   Time 8   Period Weeks   Status New               Plan - 05/23/15 1100    Clinical Impression Statement Patient demonstrated improved coordination with multiple repetitions of exercises with instruction. Patient did better with core control exercises on Swiss ball compared to standard chair.    Pt will benefit from skilled therapeutic intervention in order to improve on the following deficits Abnormal gait;Decreased activity tolerance;Decreased balance;Decreased coordination;Decreased knowledge of use of DME;Decreased mobility;Decreased strength;Postural dysfunction   Rehab Potential Good   PT Frequency 1x / week   PT Duration 8 weeks   PT Treatment/Interventions ADLs/Self Care Home Management;Functional mobility training;Stair training;Gait training;DME Instruction;Therapeutic activities;Therapeutic exercise;Balance training;Neuromuscular re-education;Patient/family education;Orthotic Fit/Training   PT Next Visit Plan Check STGs, Review HEP instructed, patient request hand exericses.   Consulted and Agree with Plan of Care Patient        Problem List Patient Active Problem List   Diagnosis Date Noted  . Coronary artery calcification of native artery 11/04/2014  . Abnormal finding on MRI of brain 10/04/2014    Jamey Reas PT, DPT 05/23/2015, 8:17 PM  [facility] [address] [place], [place], [address] Phone: [phone]   Fax:  [phone]  Name: OMER MABIE MRN: [id number] Date of Birth: [birth year]

## 2015-05-26 ENCOUNTER — Ambulatory Visit (HOSPITAL_COMMUNITY)
Admission: RE | Admit: 2015-05-26 | Discharge: 2015-05-26 | Disposition: A | Discharge status: Home / Self Care | Payer: Medicare Other | Source: Ambulatory Visit | Attending: Family Medicine | Admitting: Family Medicine

## 2015-05-26 ENCOUNTER — Other Ambulatory Visit (HOSPITAL_COMMUNITY): Payer: Self-pay | Admitting: Family Medicine

## 2015-05-26 DIAGNOSIS — R609 Edema, unspecified: Secondary | ICD-10-CM

## 2015-05-26 DIAGNOSIS — M7989 Other specified soft tissue disorders: Secondary | ICD-10-CM | POA: Diagnosis present

## 2015-05-26 NOTE — Progress Notes (Signed)
VASCULAR LAB PRELIMINARY  PRELIMINARY  PRELIMINARY  PRELIMINARY  Left lower extremity venous duplex completed.    Preliminary report:  Left:  No evidence of DVT, superficial thrombosis, or Baker's cyst.  Lenny Bouchillon, RVS 05/26/2015, 6:20 PM

## 2015-05-30 ENCOUNTER — Encounter: Payer: Self-pay | Admitting: Physical Therapy

## 2015-05-30 ENCOUNTER — Ambulatory Visit: Payer: Medicare Other | Attending: Psychiatry | Admitting: Physical Therapy

## 2015-05-30 DIAGNOSIS — R29818 Other symptoms and signs involving the nervous system: Secondary | ICD-10-CM | POA: Diagnosis present

## 2015-05-30 DIAGNOSIS — R278 Other lack of coordination: Secondary | ICD-10-CM | POA: Insufficient documentation

## 2015-05-30 DIAGNOSIS — R2689 Other abnormalities of gait and mobility: Secondary | ICD-10-CM

## 2015-05-30 DIAGNOSIS — R531 Weakness: Secondary | ICD-10-CM

## 2015-05-30 DIAGNOSIS — R269 Unspecified abnormalities of gait and mobility: Secondary | ICD-10-CM | POA: Diagnosis present

## 2015-05-30 DIAGNOSIS — R2681 Unsteadiness on feet: Secondary | ICD-10-CM | POA: Diagnosis present

## 2015-05-30 NOTE — Therapy (Signed)
[facility] [address] [place], [place], [address] Phone: [phone]   Fax:  [phone]  Physical Therapy Treatment  Patient Details  Name: Lindsay Clark MRN: [id number] Date of Birth: 09-17-1940 Referring Provider: Nedra Hai, MD  Encounter Date: 05/30/2015      PT End of Session - 05/30/15 1100    Visit Number 5   Number of Visits 9   Date for PT Re-Evaluation 07/01/15   Authorization Type Medicare G-code & Progress Note every 10 visits   PT Start Time 1020   PT Stop Time 1108   PT Time Calculation (min) 48 min   Activity Tolerance Patient tolerated treatment well   Behavior During Therapy [facility] for tasks assessed/performed      Past Medical History  Diagnosis Date  . Stroke ([place])   . Chronic dermatitis   . Hypertension   . 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 [redacted] week gestation of pregnancy   . Stroke with cerebral ischemia ([place]) 10/20/2014  . Lymphoma ([place]) 10/20/2014  . Multiple lesions on CT of brain and spine     reports lesions from previous CT scan at [facility]     Past Surgical History  Procedure Laterality Date  . Tubal ligation  1980    There were no vitals filed for this visit.  Visit Diagnosis:  Abnormality of gait  Weakness generalized  Coordination abnormal  Unsteadiness  Balance problems      Subjective Assessment - 05/30/15 1020    Subjective She got AFO on Friday morning and has been wearing it. She notes it to not turn out as much.    Currently in Pain? No/denies     Patient arrived wearing ASO & new Noodle AFO with reports that it seems to be helping. Therapeutic Exercise: PT instructed in hand exercises (see pt instructions & pt education) - finger abduction /adduction with hand on table top, rolling 2" balls and 1" blocks in hand switching clockwise & counter clockwise, picking up marbles & placing in target, assessed with monkey chain but not a  challenge, writing / coloring with discussion differences in diameter, crayon vs pencil, tossing ball between hands, wrist abduction /adduction, baseball swing & golf swing maintaining grip, wisk with large handle circles in bottom & side of sink and then conducting type motions. Pt verbalized understanding.                             PT Education - 05/30/15 1130    Education provided Yes   Education Details HEP for hand coordination / strength   Person(s) Educated Patient   Methods Explanation;Demonstration;Tactile cues;Verbal cues;Handout   Comprehension Verbalized understanding;Returned demonstration;Verbal cues required;Tactile cues required          PT Short Term Goals - 05/30/15 1134    PT SHORT TERM GOAL #1   Title demonstrates initial HEP correctly. (Target Date: 06/03/2015)   Baseline MET 05/30/2015   Time 4   Period Weeks   Status Achieved   PT SHORT TERM GOAL #2   Title ambulates 500' without device scanning environment with balance loss.  (Target Date: 06/03/2015)   Time 4   Period Weeks   Status On-going   PT SHORT TERM GOAL #3   Title Oceanographer >46/56  (Target Date: 06/03/2015)   Time 4   Period Weeks   Status On-going   PT SHORT TERM GOAL #4  Title Patient verbalizes understanding of orthotic recommendation.  (Target Date: 06/03/2015)   Baseline MET 05/30/2015   Time 4   Period Weeks   Status Achieved           PT Long Term Goals - 05/04/15 1100    PT LONG TERM GOAL #1   Title Patient ambulates 1000' outside including ramps, curbs, grass without device with AFO independently.   (Target Date: 07/01/2015)   Time 8   Period Weeks   Status New   PT LONG TERM GOAL #2   Title verbalizes / demonstrates HEP / ongoing fitness plan correctly.  (Target Date: 07/01/2015)   Time 8   Period Weeks   Status New   PT LONG TERM GOAL #3   Title Functional Gait Assessment >/= 20/30 to indicate lower fall risk   (Target Date: 07/01/2015)   Time 8    Period Weeks   Status New   PT LONG TERM GOAL #4   Title Functional Status self report improves by 5 points.   (Target Date: 07/01/2015)   Time 8   Period Weeks   Status New   PT LONG TERM GOAL #5   Title Gait Velocity >2.62 ft/sec to indicate community mobility.   (Target Date: 07/01/2015)   Time 8   Period Weeks   Status New               Plan - 05/30/15 1100    Clinical Impression Statement Today's session focused on hand coordination & strength exercises. Patient verbalized understanding. Patient met 2 of 4 STGs with other not checked due to time constraint with focus on UE exercises.    Pt will benefit from skilled therapeutic intervention in order to improve on the following deficits Abnormal gait;Decreased activity tolerance;Decreased balance;Decreased coordination;Decreased knowledge of use of DME;Decreased mobility;Decreased strength;Postural dysfunction   Rehab Potential Good   PT Frequency 1x / week   PT Duration 8 weeks   PT Treatment/Interventions ADLs/Self Care Home Management;Functional mobility training;Stair training;Gait training;DME Instruction;Therapeutic activities;Therapeutic exercise;Balance training;Neuromuscular re-education;Patient/family education;Orthotic Fit/Training   PT Next Visit Plan Check STGs of Berg & gait, Review HEP instructed   Consulted and Agree with Plan of Care Patient        Problem List Patient Active Problem List   Diagnosis Date Noted  . Coronary artery calcification of native artery 11/04/2014  . Abnormal finding on MRI of brain 10/04/2014    Jamey Reas PT, DPT 05/30/2015, 11:35 AM  [facility] [address] [place], [place], [address] Phone: [phone]   Fax:  [phone]  Name: Lindsay Clark MRN: [id number] Date of Birth: [birth year]

## 2015-05-30 NOTE — Patient Instructions (Signed)
Dexterity: In-Hand Manipulation    Turn a block around in one hand. Can also use a plastic bottle cap. For greater challenge, roll 2 blocks around in one hand. Gather pennies, marbles, etc., and hold in hand. Transfer one at a time out of hand. Can be used in games.  Copyright  VHI. All rights reserved.  Eye-Hand Coordination: Tool Use    Place your hand over child's to assist in using simple tools such as a toy hammer or pencil. May assist child to use full handed grasp. Playing piano.   Copyright  VHI. All rights reserved.  Marching in Place: Ball Throw Hand to [employer] in place, throw ball from hand to hand. Track ball movement with head and eyes. Repeat ____ times per session. Do ____ sessions per day. Repeat on compliant surface: ________.  Copyright  VHI. All rights reserved.  Open Hand / Reach Opposite    Reach one arm forward toward target. Open other hand at same time. ___ reps per set, ___ sets per day, ___ days per week   Copyright  VHI. All rights reserved.  Wrist / Hand Radial / Ulnar Deviation    With fingers together, palm down, move right hand toward thumb with motion at wrist only. Then move toward little finger. Repeat sequence ____ times per session. Do ____ sessions per week. Variation: Palms facing each other, move right hand up toward thumb, then down toward little finger.  Copyright  VHI. All rights reserved.    Baseball bat : swing bat as if hitting Golf club: putt & swing to drive

## 2015-06-07 ENCOUNTER — Ambulatory Visit: Payer: Medicare Other | Admitting: Physical Therapy

## 2015-06-07 ENCOUNTER — Encounter: Payer: Self-pay | Admitting: Physical Therapy

## 2015-06-07 DIAGNOSIS — R2689 Other abnormalities of gait and mobility: Secondary | ICD-10-CM

## 2015-06-07 DIAGNOSIS — R531 Weakness: Secondary | ICD-10-CM

## 2015-06-07 DIAGNOSIS — R269 Unspecified abnormalities of gait and mobility: Secondary | ICD-10-CM | POA: Diagnosis not present

## 2015-06-07 DIAGNOSIS — R2681 Unsteadiness on feet: Secondary | ICD-10-CM

## 2015-06-07 DIAGNOSIS — R278 Other lack of coordination: Secondary | ICD-10-CM

## 2015-06-07 NOTE — Therapy (Signed)
[facility] [address] [place], [place], [address] Phone: [phone]   Fax:  [phone]  Physical Therapy Treatment  Patient Details  Name: Lindsay Clark MRN: [id number] Date of Birth: 15-Jan-1941 Referring Provider: Nedra Hai, MD  Encounter Date: 06/07/2015      PT End of Session - 06/07/15 1105    Visit Number 6   Number of Visits 9   Date for PT Re-Evaluation 07/01/15   Authorization Type Medicare G-code & Progress Note every 10 visits   PT Start Time 1022   PT Stop Time 1105   PT Time Calculation (min) 43 min   Activity Tolerance Patient tolerated treatment well   Behavior During Therapy [facility] for tasks assessed/performed      Past Medical History  Diagnosis Date  . Stroke ([facility])   . Chronic dermatitis   . Hypertension   . 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 [redacted] week gestation of pregnancy   . Stroke with cerebral ischemia ([place]) 10/20/2014  . Lymphoma ([place]) 10/20/2014  . Multiple lesions on CT of brain and spine     reports lesions from previous CT scan at [facility]     Past Surgical History  Procedure Laterality Date  . Tubal ligation  1980    There were no vitals filed for this visit.  Visit Diagnosis:  Abnormality of gait  Weakness generalized  Coordination abnormal  Unsteadiness  Balance problems      Subjective Assessment - 06/07/15 1020    Subjective Brace velcro came off. (PT reattached & demo to pt how it attaches). She reports improved coordination of hand with piano playing and with LE prior to donning the AFO in mornings.   Currently in Pain? No/denies     Therapeutic Exercise: PT demonstrated AFO velcro attachments and use of stockinette under band to reduce skin irritation.  Attempted badminton birdie tossing between UEs but less controlled than tennis ball.  PT instructed with washcloth:  1) ball up cloth completely first then toss to other hand.  When performing with left hand focus on 4th & 5th fingers and attempt to match with RUE.  2) Ball up cloth, reach under thigh from outside & toss up between LEs catching with opposite hand.  3) Pass cloth behind back between hands (without vision) 4) Reach behind back & place cloth on mat table. Feel with opposite UE to pick it up. Focus on hand & 4th/5th finger position with LUE to match with RUE.  Pt return demo understanding of above exercises & took notes in her notebook.  Opening containers: small medicine bottles & screw led 1"-3" diameter with cues on hand position. Pt reports problems with her jelly jar. Patient able to open salsa & jelly jars with 5" diameter lids. PT cued on upper arm position for stabilization & hand position along with 4th & 5th finger function. Pt able to return demo understanding.                            PT Education - 06/07/15 1203    Education provided Yes   Education Details Updated hand coordination exercises. AFO velcro attachments    Person(s) Educated Patient   Methods Explanation;Demonstration;Verbal cues   Comprehension Verbalized understanding;Returned demonstration;Verbal cues required          PT Short Term Goals - 05/30/15 1134    PT SHORT TERM GOAL #1  Title demonstrates initial HEP correctly. (Target Date: 06/03/2015)   Baseline MET 05/30/2015   Time 4   Period Weeks   Status Achieved   PT SHORT TERM GOAL #2   Title ambulates 500' without device scanning environment with balance loss.  (Target Date: 06/03/2015)   Time 4   Period Weeks   Status On-going   PT SHORT TERM GOAL #3   Title Oceanographer >46/56  (Target Date: 06/03/2015)   Time 4   Period Weeks   Status On-going   PT SHORT TERM GOAL #4   Title Patient verbalizes understanding of orthotic recommendation.  (Target Date: 06/03/2015)   Baseline MET 05/30/2015   Time 4   Period Weeks   Status Achieved           PT Long Term Goals - 05/04/15 1100     PT LONG TERM GOAL #1   Title Patient ambulates 1000' outside including ramps, curbs, grass without device with AFO independently.   (Target Date: 07/01/2015)   Time 8   Period Weeks   Status New   PT LONG TERM GOAL #2   Title verbalizes / demonstrates HEP / ongoing fitness plan correctly.  (Target Date: 07/01/2015)   Time 8   Period Weeks   Status New   PT LONG TERM GOAL #3   Title Functional Gait Assessment >/= 20/30 to indicate lower fall risk   (Target Date: 07/01/2015)   Time 8   Period Weeks   Status New   PT LONG TERM GOAL #4   Title Functional Status self report improves by 5 points.   (Target Date: 07/01/2015)   Time 8   Period Weeks   Status New   PT LONG TERM GOAL #5   Title Gait Velocity >2.62 ft/sec to indicate community mobility.   (Target Date: 07/01/2015)   Time 8   Period Weeks   Status New               Plan - 06/07/15 1105    Clinical Impression Statement Patient improved hand coordination with toss & catch with washcloth. Patient verbalized understanding how upper arm position changes hand position & control with opening jars.    Pt will benefit from skilled therapeutic intervention in order to improve on the following deficits Abnormal gait;Decreased activity tolerance;Decreased balance;Decreased coordination;Decreased knowledge of use of DME;Decreased mobility;Decreased strength;Postural dysfunction   Rehab Potential Good   PT Frequency 1x / week   PT Duration 8 weeks   PT Treatment/Interventions ADLs/Self Care Home Management;Functional mobility training;Stair training;Gait training;DME Instruction;Therapeutic activities;Therapeutic exercise;Balance training;Neuromuscular re-education;Patient/family education;Orthotic Fit/Training   PT Next Visit Plan continue towards LTGs   Consulted and Agree with Plan of Care Patient        Problem List Patient Active Problem List   Diagnosis Date Noted  . Coronary artery calcification of native artery  11/04/2014  . Abnormal finding on MRI of brain 10/04/2014    Amina Menchaca PT, DPT 06/07/2015, 12:10 PM  [facility] [address] [place], [place], [address] Phone: [phone]   Fax:  [phone]  Name: ARACELIA BRINSON MRN: [id number] Date of Birth: August 18, 1940

## 2015-06-13 ENCOUNTER — Encounter: Payer: Self-pay | Admitting: Physical Therapy

## 2015-06-13 ENCOUNTER — Ambulatory Visit: Payer: Medicare Other | Admitting: Physical Therapy

## 2015-06-13 DIAGNOSIS — R269 Unspecified abnormalities of gait and mobility: Secondary | ICD-10-CM | POA: Diagnosis not present

## 2015-06-13 DIAGNOSIS — R2681 Unsteadiness on feet: Secondary | ICD-10-CM

## 2015-06-13 DIAGNOSIS — R2689 Other abnormalities of gait and mobility: Secondary | ICD-10-CM

## 2015-06-13 DIAGNOSIS — R278 Other lack of coordination: Secondary | ICD-10-CM

## 2015-06-13 DIAGNOSIS — R531 Weakness: Secondary | ICD-10-CM

## 2015-06-13 NOTE — Therapy (Signed)
[facility] [address] [place], [place], [address] Phone: [phone]   Fax:  [phone]  Physical Therapy Treatment  Patient Details  Name: Lindsay Clark MRN: [id number] Date of Birth: [birth year] Referring Provider: Nedra Hai, MD  Encounter Date: 06/13/2015      PT End of Session - 06/13/15 1100    Visit Number 7   Number of Visits 9   Date for PT Re-Evaluation 07/01/15   Authorization Type Medicare G-code & Progress Note every 10 visits   PT Start Time 1017   PT Stop Time 1100   PT Time Calculation (min) 43 min   Activity Tolerance Patient tolerated treatment well   Behavior During Therapy [facility] for tasks assessed/performed      Past Medical History  Diagnosis Date  . Stroke ([place])   . Chronic dermatitis   . Hypertension   . 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 [redacted] week gestation of pregnancy   . Stroke with cerebral ischemia ([place]) 10/20/2014  . Lymphoma ([place]) 10/20/2014  . Multiple lesions on CT of brain and spine     reports lesions from previous CT scan at [facility]     Past Surgical History  Procedure Laterality Date  . Tubal ligation  1980    There were no vitals filed for this visit.  Visit Diagnosis:  Abnormality of gait  Weakness generalized  Coordination abnormal  Unsteadiness  Balance problems      Subjective Assessment - 06/13/15 1015    Subjective She reports her hand exercises are helping her hand get better but she has not been doing her leg exercises so her leg seems weaker.    Currently in Pain? No/denies      Neuromuscular Re-education: PT reviewed coordination / control exercises in prone for hamstrings & hip rotators, quadriped UE/LE and sidelying hip abduction. See pt education.                            PT Education - 06/13/15 1100    Education provided Yes   Education Details open vs closed chain exercises,  alternating LEs adds a challenge, rounding out exercises by grouping and performing some 3 days /wk and others 3 days/wk.    Person(s) Educated Patient   Methods Explanation;Verbal cues   Comprehension Verbalized understanding;Verbal cues required          PT Short Term Goals - 05/30/15 1134    PT SHORT TERM GOAL #1   Title demonstrates initial HEP correctly. (Target Date: 06/03/2015)   Baseline MET 05/30/2015   Time 4   Period Weeks   Status Achieved   PT SHORT TERM GOAL #2   Title ambulates 500' without device scanning environment with balance loss.  (Target Date: 06/03/2015)   Time 4   Period Weeks   Status On-going   PT SHORT TERM GOAL #3   Title Oceanographer >46/56  (Target Date: 06/03/2015)   Time 4   Period Weeks   Status On-going   PT SHORT TERM GOAL #4   Title Patient verbalizes understanding of orthotic recommendation.  (Target Date: 06/03/2015)   Baseline MET 05/30/2015   Time 4   Period Weeks   Status Achieved           PT Long Term Goals - 05/04/15 1100    PT LONG TERM GOAL #1   Title Patient ambulates 1000' outside including ramps, curbs,  grass without device with AFO independently.   (Target Date: 07/01/2015)   Time 8   Period Weeks   Status New   PT LONG TERM GOAL #2   Title verbalizes / demonstrates HEP / ongoing fitness plan correctly.  (Target Date: 07/01/2015)   Time 8   Period Weeks   Status New   PT LONG TERM GOAL #3   Title Functional Gait Assessment >/= 20/30 to indicate lower fall risk   (Target Date: 07/01/2015)   Time 8   Period Weeks   Status New   PT LONG TERM GOAL #4   Title Functional Status self report improves by 5 points.   (Target Date: 07/01/2015)   Time 8   Period Weeks   Status New   PT LONG TERM GOAL #5   Title Gait Velocity >2.62 ft/sec to indicate community mobility.   (Target Date: 07/01/2015)   Time 8   Period Weeks   Status New               Plan - 06/13/15 1100    Clinical Impression Statement Patient appears  to understand rounding out exercises to not be overwhelmed but exercise both UE & LE along with alternating open & closed chain.    Pt will benefit from skilled therapeutic intervention in order to improve on the following deficits Abnormal gait;Decreased activity tolerance;Decreased balance;Decreased coordination;Decreased knowledge of use of DME;Decreased mobility;Decreased strength;Postural dysfunction   Rehab Potential Good   PT Frequency 1x / week   PT Duration 8 weeks   PT Treatment/Interventions ADLs/Self Care Home Management;Functional mobility training;Stair training;Gait training;DME Instruction;Therapeutic activities;Therapeutic exercise;Balance training;Neuromuscular re-education;Patient/family education;Orthotic Fit/Training   PT Next Visit Plan continue towards LTGs   Consulted and Agree with Plan of Care Patient        Problem List Patient Active Problem List   Diagnosis Date Noted  . Coronary artery calcification of native artery 11/04/2014  . Abnormal finding on MRI of brain 10/04/2014    Jamey Reas PT, DPT 06/13/2015, 7:14 PM  [facility] [address] [place], [place], [address] Phone: [phone]   Fax:  [phone]  Name: CARMELITA AMPARO MRN: [id number] Date of Birth: Feb 15, 1941

## 2015-06-20 ENCOUNTER — Ambulatory Visit: Payer: Medicare Other | Admitting: Physical Therapy

## 2015-06-20 ENCOUNTER — Encounter: Payer: Self-pay | Admitting: Physical Therapy

## 2015-06-20 DIAGNOSIS — R269 Unspecified abnormalities of gait and mobility: Secondary | ICD-10-CM | POA: Diagnosis not present

## 2015-06-20 DIAGNOSIS — R278 Other lack of coordination: Secondary | ICD-10-CM

## 2015-06-20 DIAGNOSIS — R531 Weakness: Secondary | ICD-10-CM

## 2015-06-20 DIAGNOSIS — R2689 Other abnormalities of gait and mobility: Secondary | ICD-10-CM

## 2015-06-20 DIAGNOSIS — R2681 Unsteadiness on feet: Secondary | ICD-10-CM

## 2015-06-20 NOTE — Therapy (Signed)
[facility] [address] [place], [place], [address] Phone: [phone]   Fax:  [phone]  Physical Therapy Treatment  Patient Details  Name: Lindsay Clark MRN: [id number] Date of Birth: 1941/01/29 Referring Provider: Nedra Hai, MD  Encounter Date: 06/20/2015      PT End of Session - 06/20/15 1100    Visit Number 8   Number of Visits 9   Date for PT Re-Evaluation 07/01/15   Authorization Type Medicare G-code & Progress Note every 10 visits   PT Start Time 8144   PT Stop Time 1100   PT Time Calculation (min) 45 min   Activity Tolerance Patient tolerated treatment well   Behavior During Therapy [facility] for tasks assessed/performed      Past Medical History  Diagnosis Date  . Stroke ([place])   . Chronic dermatitis   . Hypertension   . 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 [redacted] week gestation of pregnancy   . Stroke with cerebral ischemia ([place]) 10/20/2014  . Lymphoma ([place]) 10/20/2014  . Multiple lesions on CT of brain and spine     reports lesions from previous CT scan at [facility]     Past Surgical History  Procedure Laterality Date  . Tubal ligation  1980    There were no vitals filed for this visit.  Visit Diagnosis:  Abnormality of gait  Weakness generalized  Coordination abnormal  Unsteadiness  Balance problems      Subjective Assessment - 06/20/15 1030    Subjective She reports she feel twice once on carpet with "raspberry" like on elbow and knee on right side and second fall sitting on glider  hammock with no injuries. She has finished Acupuncture.    Patient Stated Goals Wants to improve her function with her medical condition   Currently in Pain? No/denies     Core stabilization, balance & coordination exercises on Swiss Ball Unsupported Lateral Pelvic Tilt    Gently move hips from side to side. Do _10-15__ repetitions.  Copyright  VHI. All rights reserved.    Unsupported Pelvic Tilt    Gently rock pelvis forward and backward. Do _10-15__ repetitions.  Copyright  VHI. All rights reserved.  Unsupported Pelvic Circle    Gently rotate pelvis clockwise _10-15__ times, then counterclockwise _10-15__ times.   Copyright  VHI. All rights reserved.  Seated Alternating Arm Raise    Sit on ball. Raise one arm above head and return. Repeat with other arm. Do __10-15_ repetitions.  Copyright  VHI. All rights reserved.  Seated Alternating Leg Raise (Marching)    Sit on ball. Raise bent knee and return. Repeat with other leg. Do  __10-15_ repetitions.  Copyright  VHI. All rights reserved.  Seated Arm Swing    Sit on ball. Swing arms forward and backward. Do  __10-15_ repetitions.  Copyright  VHI. All rights reserved.  Seated Bouncing    Sit on ball. Bounce up and down. Do __20_ repetitions.  Copyright  VHI. All rights reserved.  Seated Bouncing Side-to-Side    Sit on ball. Bounce side to side. Do __10_ repetitions.  Copyright  VHI. All rights reserved.  Seated Leg Extension    Sit on ball. Straighten knee and return. Repeat with other leg. Do  _10-15__ repetitions.  Copyright  VHI. All rights reserved.  Seated Opposite-Leg / Arm    Sit on ball. Raise one leg and the opposite arm. Repeat with other limbs.  Do  alternating for  _101-15__ repetitions.   Copyright  VHI. All rights reserved.  Bilateral Arm Circle    Sit with arms straight out from sides. Make forward circles, then backward circles. Do 10-15 ___ repetitions.  Copyright  VHI. All rights reserved.                              PT Education - 06/20/15 1100    Education provided Yes   Education Details seated swiss ball exercises   Person(s) Educated Patient   Methods Explanation;Demonstration;Tactile cues;Verbal cues;Handout   Comprehension Verbalized understanding;Returned demonstration;Verbal cues  required;Tactile cues required;Need further instruction          PT Short Term Goals - 05/30/15 1134    PT SHORT TERM GOAL #1   Title demonstrates initial HEP correctly. (Target Date: 06/03/2015)   Baseline MET 05/30/2015   Time 4   Period Weeks   Status Achieved   PT SHORT TERM GOAL #2   Title ambulates 500' without device scanning environment with balance loss.  (Target Date: 06/03/2015)   Time 4   Period Weeks   Status On-going   PT SHORT TERM GOAL #3   Title Oceanographer >46/56  (Target Date: 06/03/2015)   Time 4   Period Weeks   Status On-going   PT SHORT TERM GOAL #4   Title Patient verbalizes understanding of orthotic recommendation.  (Target Date: 06/03/2015)   Baseline MET 05/30/2015   Time 4   Period Weeks   Status Achieved           PT Long Term Goals - 05/04/15 1100    PT LONG TERM GOAL #1   Title Patient ambulates 1000' outside including ramps, curbs, grass without device with AFO independently.   (Target Date: 07/01/2015)   Time 8   Period Weeks   Status New   PT LONG TERM GOAL #2   Title verbalizes / demonstrates HEP / ongoing fitness plan correctly.  (Target Date: 07/01/2015)   Time 8   Period Weeks   Status New   PT LONG TERM GOAL #3   Title Functional Gait Assessment >/= 20/30 to indicate lower fall risk   (Target Date: 07/01/2015)   Time 8   Period Weeks   Status New   PT LONG TERM GOAL #4   Title Functional Status self report improves by 5 points.   (Target Date: 07/01/2015)   Time 8   Period Weeks   Status New   PT LONG TERM GOAL #5   Title Gait Velocity >2.62 ft/sec to indicate community mobility.   (Target Date: 07/01/2015)   Time 8   Period Weeks   Status New               Plan - 06/20/15 1100    Clinical Impression Statement Patient appears to understand Swiss Ball exercises to work on core stabilty and balance /coordination. Patient's right LE has improved to maintain positon of support in sitting in chairs but required frequent  cues on [employer].    Pt will benefit from skilled therapeutic intervention in order to improve on the following deficits Abnormal gait;Decreased activity tolerance;Decreased balance;Decreased coordination;Decreased knowledge of use of DME;Decreased mobility;Decreased strength;Postural dysfunction   Rehab Potential Good   PT Frequency 1x / week   PT Duration 8 weeks   PT Treatment/Interventions ADLs/Self Care Home Management;Functional mobility training;Stair training;Gait training;DME Instruction;Therapeutic activities;Therapeutic exercise;Balance training;Neuromuscular re-education;Patient/family education;Orthotic Fit/Training   PT Next Visit  Plan Assess LTGs   Consulted and Agree with Plan of Care Patient        Problem List Patient Active Problem List   Diagnosis Date Noted  . Coronary artery calcification of native artery 11/04/2014  . Abnormal finding on MRI of brain 10/04/2014    Jamey Reas PT, DPT 06/20/2015, 2:14 PM  [facility] [address] [place], [place], [address] Phone: [phone]   Fax:  [phone]  Name: Lindsay Clark MRN: [id number] Date of Birth: 04/10/1941

## 2015-06-20 NOTE — Patient Instructions (Signed)
Unsupported Lateral Pelvic Tilt    Gently move hips from side to side. Do _10-15__ repetitions.  Copyright  VHI. All rights reserved.  Unsupported Pelvic Tilt    Gently rock pelvis forward and backward. Do _10-15__ repetitions.  Copyright  VHI. All rights reserved.  Unsupported Pelvic Circle    Gently rotate pelvis clockwise _10-15__ times, then counterclockwise _10-15__ times.   Copyright  VHI. All rights reserved.  Seated Alternating Arm Raise    Sit on ball. Raise one arm above head and return. Repeat with other arm. Do __10-15_ repetitions.  Copyright  VHI. All rights reserved.  Seated Alternating Leg Raise (Marching)    Sit on ball. Raise bent knee and return. Repeat with other leg. Do  __10-15_ repetitions.  Copyright  VHI. All rights reserved.  Seated Arm Swing    Sit on ball. Swing arms forward and backward. Do  __10-15_ repetitions.  Copyright  VHI. All rights reserved.  Seated Bouncing    Sit on ball. Bounce up and down. Do __20_ repetitions.  Copyright  VHI. All rights reserved.  Seated Bouncing Side-to-Side    Sit on ball. Bounce side to side. Do __10_ repetitions.  Copyright  VHI. All rights reserved.  Seated Leg Extension    Sit on ball. Straighten knee and return. Repeat with other leg. Do  _10-15__ repetitions.  Copyright  VHI. All rights reserved.  Seated Opposite-Leg / Arm    Sit on ball. Raise one leg and the opposite arm. Repeat with other limbs.  Do  alternating for  _101-15__ repetitions.   Copyright  VHI. All rights reserved.  Bilateral Arm Circle    Sit with arms straight out from sides. Make forward circles, then backward circles. Do 10-15 ___ repetitions.  Copyright  VHI. All rights reserved.

## 2015-06-27 ENCOUNTER — Ambulatory Visit: Payer: Medicare Other | Attending: Psychiatry | Admitting: Physical Therapy

## 2015-06-27 ENCOUNTER — Encounter: Payer: Self-pay | Admitting: Physical Therapy

## 2015-06-27 DIAGNOSIS — R2681 Unsteadiness on feet: Secondary | ICD-10-CM

## 2015-06-27 DIAGNOSIS — R269 Unspecified abnormalities of gait and mobility: Secondary | ICD-10-CM

## 2015-06-27 DIAGNOSIS — R278 Other lack of coordination: Secondary | ICD-10-CM | POA: Diagnosis present

## 2015-06-27 DIAGNOSIS — R531 Weakness: Secondary | ICD-10-CM

## 2015-06-27 DIAGNOSIS — R29818 Other symptoms and signs involving the nervous system: Secondary | ICD-10-CM | POA: Diagnosis present

## 2015-06-27 DIAGNOSIS — R2689 Other abnormalities of gait and mobility: Secondary | ICD-10-CM

## 2015-06-27 NOTE — Therapy (Signed)
[facility] [address] [place], [place], [address] Phone: [phone]   Fax:  [phone]  Physical Therapy Treatment  Patient Details  Name: Lindsay Clark MRN: [id number] Date of Birth: 1940/09/27 Referring Provider: Nedra Hai, MD  Encounter Date: 06/27/2015      PT End of Session - 06/27/15 1114    Visit Number 9   Number of Visits 9   Date for PT Re-Evaluation 07/01/15   Authorization Type Medicare G-code & Progress Note every 10 visits   PT Start Time 1022   PT Stop Time 1105   PT Time Calculation (min) 43 min   Activity Tolerance Patient tolerated treatment well   Behavior During Therapy [facility] for tasks assessed/performed      Past Medical History  Diagnosis Date  . Stroke ([place])   . Chronic dermatitis   . Hypertension   . 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 [redacted] week gestation of pregnancy   . Stroke with cerebral ischemia ([place]) 10/20/2014  . Lymphoma ([place]) 10/20/2014  . Multiple lesions on CT of brain and spine     reports lesions from previous CT scan at [facility]     Past Surgical History  Procedure Laterality Date  . Tubal ligation  1980    There were no vitals filed for this visit.  Visit Diagnosis:  Abnormality of gait  Weakness generalized  Coordination abnormal  Unsteadiness  Balance problems      Subjective Assessment - 06/27/15 1024    Subjective No falls in last week.    Currently in Pain? No/denies            [facility] PT Assessment - 06/27/15 1015    Observation/Other Assessments   Focus on Therapeutic Outcomes (FOTO)  54.07 Functional Status   Activities of Balance Confidence Scale (ABC Scale)  92.5%   Fear Avoidance Belief Questionnaire (FABQ)  19 (5)   Ambulation/Gait   Assistive device None   Stairs Yes   Stairs Assistance 6: Modified independent (Device/Increase time)   Stair Management Technique No rails;Alternating pattern;Forwards    Number of Stairs 4   Standardized Balance Assessment   Standardized Balance Assessment Higher education careers adviser Organization Test   Berg Balance Test   Sit to Stand Able to stand without using hands and stabilize independently   Standing Unsupported Able to stand safely 2 minutes   Sitting with Back Unsupported but Feet Supported on Floor or Stool Able to sit safely and securely 2 minutes   Stand to Sit Sits safely with minimal use of hands   Transfers Able to transfer safely, minor use of hands   Standing Unsupported with Eyes Closed Able to stand 10 seconds safely   Standing Ubsupported with Feet Together Able to place feet together independently and stand 1 minute safely   From Standing, Reach Forward with Outstretched Arm Can reach confidently >25 cm (10")   From Standing Position, Pick up Object from Floor Able to pick up shoe safely and easily   From Standing Position, Turn to Look Behind Over each Shoulder Looks behind from both sides and weight shifts well   Turn 360 Degrees Able to turn 360 degrees safely in 4 seconds or less   Standing Unsupported, Alternately Place Feet on Step/Stool Able to stand independently and safely and complete 8 steps in 20 seconds   Standing Unsupported, One Foot in Front Able to take small step independently and  hold 30 seconds   Standing on One Leg Able to lift leg independently and hold equal to or more than 3 seconds   Total Score 52   Balance Master Testing    Results Composite 65 (72), All senory analysis WNL, Strategy Analysis No hip with "fall" during one condition; Center of Gravity 6 of 18 trials with weight shift left of COG   Functional Gait  Assessment   Gait assessed  Yes   Gait Level Surface Walks 20 ft in less than 5.5 sec, no assistive devices, good speed, no evidence for imbalance, normal gait pattern, deviates no more than 6 in outside of the 12 in walkway width.   Change in Gait Speed Able to smoothly change  walking speed without loss of balance or gait deviation. Deviate no more than 6 in outside of the 12 in walkway width.   Gait with Horizontal Head Turns Performs head turns smoothly with no change in gait. Deviates no more than 6 in outside 12 in walkway width   Gait with Vertical Head Turns Performs head turns with no change in gait. Deviates no more than 6 in outside 12 in walkway width.   Gait and Pivot Turn Pivot turns safely within 3 sec and stops quickly with no loss of balance.   Step Over Obstacle Is able to step over one shoe box (4.5 in total height) without changing gait speed. No evidence of imbalance.   Gait with Narrow Base of Support Ambulates less than 4 steps heel to toe or cannot perform without assistance.   Gait with Eyes Closed Walks 20 ft, uses assistive device, slower speed, mild gait deviations, deviates 6-10 in outside 12 in walkway width. Ambulates 20 ft in less than 9 sec but greater than 7 sec.   Ambulating Backwards Walks 20 ft, uses assistive device, slower speed, mild gait deviations, deviates 6-10 in outside 12 in walkway width.   Steps Alternating feet, no rail.   Total Score 24                     [facility] Adult PT Treatment/Exercise - 06/27/15 1015    Ambulation/Gait   Ambulation/Gait Yes   Ambulation/Gait Assistance 6: Modified independent (Device/Increase time)   Ambulation Distance (Feet) 1000 Feet   Gait velocity 4.22 ft/sec comfortable; 5.05 ft/sec fast                  PT Short Term Goals - 05/30/15 1134    PT SHORT TERM GOAL #1   Title demonstrates initial HEP correctly. (Target Date: 06/03/2015)   Baseline MET 05/30/2015   Time 4   Period Weeks   Status Achieved   PT SHORT TERM GOAL #2   Title ambulates 500' without device scanning environment with balance loss.  (Target Date: 06/03/2015)   Time 4   Period Weeks   Status On-going   PT SHORT TERM GOAL #3   Title Oceanographer >46/56  (Target Date: 06/03/2015)   Time 4    Period Weeks   Status On-going   PT SHORT TERM GOAL #4   Title Patient verbalizes understanding of orthotic recommendation.  (Target Date: 06/03/2015)   Baseline MET 05/30/2015   Time 4   Period Weeks   Status Achieved           PT Long Term Goals - 06/27/15 1115    PT LONG TERM GOAL #1   Title Patient ambulates 1000' outside including ramps, curbs, grass without  device with AFO independently.   (Target Date: 07/01/2015)   Baseline MET July 02, 2015   Time 8   Period Weeks   Status Achieved   PT LONG TERM GOAL #2   Title verbalizes / demonstrates HEP / ongoing fitness plan correctly.  (Target Date: 07/01/2015)   Baseline MET 07/02/15   Time 8   Period Weeks   Status Achieved   PT LONG TERM GOAL #3   Title Functional Gait Assessment >/= 20/30 to indicate lower fall risk   (Target Date: 07/01/2015)   Baseline MET 06/25/2015  FGA 24/30   Time 8   Period Weeks   Status Achieved   PT LONG TERM GOAL #4   Title Functional Status self report improves by 5 points.   (Target Date: 07/01/2015)   Baseline Partially MET 07-02-15   Functional Status decreased 5 points from 59 to 54%; however her ABC (Activities of Balance Confidence) improved from 86.9% to 92.5% (improvement of 5.6%)   Time 8   Period Weeks   Status Partially Met   PT LONG TERM GOAL #5   Title Gait Velocity >2.62 ft/sec to indicate community mobility.   (Target Date: 07/01/2015)   Baseline MET Jul 02, 2015 Gait Velocity 4.22 ft/sec at comfortable pace & 5.05 ft/sec at fast pace   Time 8   Period Weeks   Status Achieved               Plan - July 02, 2015 1115    Clinical Impression Statement Patient met or partially met all long term goals established at evaluation. Patient did report that day of discharge was one of her better days. Patient appears to understand comphrensive exercise program / fitness plan with variety of exercises to address strength, flexiblity, balance & endurance. Patient understands need for high repetition of  activities with quality movement patterns is optimal. If patient feels she needs further PT in a few months to update exercises & activities to maximize her potential, please refer her back at that time.    Pt will benefit from skilled therapeutic intervention in order to improve on the following deficits Abnormal gait;Decreased activity tolerance;Decreased balance;Decreased coordination;Decreased knowledge of use of DME;Decreased mobility;Decreased strength;Postural dysfunction   Rehab Potential Good   PT Frequency 1x / week   PT Duration 8 weeks   PT Treatment/Interventions ADLs/Self Care Home Management;Functional mobility training;Stair training;Gait training;DME Instruction;Therapeutic activities;Therapeutic exercise;Balance training;Neuromuscular re-education;Patient/family education;Orthotic Fit/Training   PT Next Visit Plan Discharge   Consulted and Agree with Plan of Care Patient          G-Codes - Jul 02, 2015 1157    Functional Assessment Tool Used Functional Gait Assessment 24/30   Functional Limitation Mobility: Walking and moving around   Mobility: Walking and Moving Around Goal Status [phone]) At least 20 percent but less than 40 percent impaired, limited or restricted   Mobility: Walking and Moving Around Discharge Status [phone]) At least 20 percent but less than 40 percent impaired, limited or restricted      Problem List Patient Active Problem List   Diagnosis Date Noted  . Coronary artery calcification of native artery 11/04/2014  . Abnormal finding on MRI of brain 10/04/2014     PHYSICAL THERAPY DISCHARGE SUMMARY  Visits from Start of Care: 9  Current functional level related to goals / functional outcomes: See above   Remaining deficits: See above   Education / Equipment: HEP / fitness plan  Plan: Patient agrees to discharge.  Patient goals were met. Patient is being discharged  due to meeting the stated rehab goals.  ?????        Marwah Disbro PT,  DPT 06/27/2015, 11:58 AM  [facility] [address] [place], [place], [address] Phone: [phone]   Fax:  [phone]  Name: MARDEL GRUDZIEN MRN: [id number] Date of Birth: [birth year]

## 2015-08-18 ENCOUNTER — Encounter (HOSPITAL_COMMUNITY): Payer: Self-pay | Admitting: Psychiatry

## 2015-08-18 ENCOUNTER — Ambulatory Visit (INDEPENDENT_AMBULATORY_CARE_PROVIDER_SITE_OTHER): Payer: 59 | Admitting: Psychiatry

## 2015-08-18 VITALS — BP 122/66 | HR 72 | Ht 64.75 in | Wt 196.2 lb

## 2015-08-18 DIAGNOSIS — F4329 Adjustment disorder with other symptoms: Secondary | ICD-10-CM | POA: Diagnosis not present

## 2015-08-18 NOTE — Progress Notes (Signed)
Psychiatric Initial Adult Assessment   Patient Identification: Lindsay Clark MRN:  [id number] Date of Evaluation:  08/18/2015 Referral Source: Dr. [name] Chief Complaint:   frustrated Visit Diagnosis: Adjustment disorder with depressed mood  History of Present Illness:  This patient is a 75 year old white married mother who carries a diagnosis of autoimmune encephalitis. After extensive diagnostic journey multiple scans this was the conclusion. The physical consequences are that she has significant weakness and coordination problems regarding her right side of her body. She is a number of cognitive complaints. She is frustrated with things and find herself breaking down and crying. She's not persistently depressed. This is a hyperfunctioning intelligent woman who is clearly bothered by her impairments physically. She claims that she's been told that her encephalitis has burned out meaning it's not progressive. Somehow it doesn't bring her relief. Generally he can tell that she puts up a good front. She is very talkative. She's almost circumstantial. She's very detail oriented. 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 The patient is happily married for 76 years but her husband is not completely well. He's had some serious back problems. She is a good relationship with him. She loves him and can communicate well. She has 2 adult children and 2 grandchildren. For this most part they're doing well. Financially she is stable. At this time she denies daily depression. She sleeping and eating well. She's got good energy. She described herself as being very frustrated with her limitations. She denies anhedonia. She plays bridge no likes to read and enjoys the television. She is high functioning. She is not suicidal. She's never made a suicide attempt. The patient denies the use of alcohol or any substances. The patient denies symptoms of generalized anxiety disorder panic disorder or  obsessive-compulsive disorder. The patient is never had any psychotic symptoms at all. She denies any symptoms consistent with mania. She seen multiple subspecialists throughout the community. The patient is no significant psychiatric history. She's never been evaluated by a psychiatrist or been in a psychiatric hospital. She's been evaluated and attended to by Dr. Antionette Poles. The patient is coming to see me for an evaluation of anything that I could contribute to help her with her emotionality and her frustration. The degree of how emotional she is frustrated is not yet clear to me and will become clearer when I see her again back in a month or 2 with her husband. I do not believe she is a candidate for [facility]a as this clearly connected emotional issue that leads to her crying. I would not call it spontaneous crying at all. She's frustrated and once she becomes aware of how she's limited she breaks down. The patient continues to function normally in every other way.  Associated Signs/Symptoms: Depression Symptoms:  depressed mood, (Hypo) Manic Symptoms:   Anxiety Symptoms:   Psychotic Symptoms:   PTSD Symptoms:   Past Psychiatric History:   Previous Psychotropic Medications: No   Substance Abuse History in the last 12 months:  No.  Consequences of Substance Abuse: Negative  Past Medical History:  Past Medical History  Diagnosis Date  . Stroke ([place])   . Chronic dermatitis   . Hypertension   . 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 [redacted] week gestation of pregnancy   . Stroke with cerebral ischemia ([place]) 10/20/2014  . Lymphoma ([place]) 10/20/2014  . Multiple lesions on CT of brain and spine     reports lesions from previous  CT scan at [facility]     Past Surgical History  Procedure Laterality Date  . Tubal ligation  1980    Family Psychiatric History:   Family History:  Family History  Problem Relation Age of Onset  . Stroke Mother   . Colon cancer Mother   .  Heart attack Father   . Emphysema Father   . Thyroid disease Son   . Thyroid disease Maternal Grandmother   . Cancer Maternal Grandmother     cervical   . Thrombosis Maternal Grandfather     Social History:   Social History   Social History  . Marital Status: Married    Spouse Name: [name]  . Number of Children: 2  . Years of Education: MAx2   Occupational History  . Retired Other   Social History Main Topics  . Smoking status: Never Smoker   . Smokeless tobacco: Never Used  . Alcohol Use: 0.0 oz/week    0 Standard drinks or equivalent per week     Comment: 4 glasses of wine weekly  . Drug Use: No  . Sexual Activity: No   Other Topics Concern  . None   Social History Narrative   Patient lives at home with her spouse.   Caffeine Use: 2-3 cups daily    Additional Social History:   Allergies:   Allergies  Allergen Reactions  . Amlodipine     04/19/15 Patient denies    Metabolic Disorder Labs: No results found for: HGBA1C, MPG No results found for: PROLACTIN No results found for: CHOL, TRIG, HDL, CHOLHDL, VLDL, LDLCALC   Current Medications: Current Outpatient Prescriptions  Medication Sig Dispense Refill  . ALTACE 5 MG capsule Take 5-15 mg by mouth 2 (two) times daily. Take 15mg  in the morning and 5mg  in the evening.    . clobetasol ointment (TEMOVATE) [id number] % Apply 1 application topically 2 (two) times daily as needed (dry skin).     . Clobetasol Propionate 0.05 % shampoo Apply 1 application topically daily as needed (itching).     . fluocinonide cream (LIDEX) [id number] % Apply 1 application topically daily as needed (itching).     . fluticasone (FLONASE) 50 MCG/ACT nasal spray Place 1 spray into both nostrils as needed for allergies or rhinitis.    Marland Kitchen gabapentin (NEURONTIN) 300 MG capsule Take 300 mg by mouth at bedtime.     . hydrochlorothiazide (HYDRODIURIL) 25 MG tablet Take 25 mg by mouth every morning.     . Multiple Vitamins-Minerals (ONE-A-DAY WOMENS 50  PLUS PO) Take 1 tablet by mouth daily.     . Omega-3 Fatty Acids (FISH OIL) 1000 MG CAPS Take 2 capsules by mouth daily.    . tacrolimus (PROTOPIC) 0.1 % ointment Apply 1 application topically 2 (two) times daily as needed (psoriasis).      No current facility-administered medications for this visit.    Neurologic: Headache: No Seizure: No Paresthesias:No  Musculoskeletal: Strength & Muscle Tone: decreased Gait & Station: ataxic Patient leans: Right  Psychiatric Specialty Exam: ROS  Blood pressure 122/66, pulse 72, height 5' 4.75" (1.645 m), weight 196 lb 3.2 oz (88.996 kg).Body mass index is 32.89 kg/(m^2).  General Appearance: Casual  Eye Contact:  Good  Speech:  Clear and Coherent  Volume:  Normal  Mood:  NA  Affect:  Appropriate  Thought Process:  Coherent  Orientation:  Full (Time, Place, and Person)  Thought Content:  WDL  Suicidal Thoughts:  No  Homicidal Thoughts:  No  Memory:  NA  Judgement:  Good  Insight:  Good  Psychomotor Activity:  Normal  Concentration:  Good  Recall:  Good  Fund of Knowledge:Good  Language: Fair  Akathisia:  No  Handed:  Right  AIMS (if indicated):    Assets:  Desire for Improvement  ADL's:  Intact  Cognition: WNL  Sleep:      Treatment Plan Summary: At this time I do not believe this patient has a major mental illness at all. I do not think she is indicated for any medications. The possibility of being in psychotherapy might be considered. The idea of helping her deal with her limitation in vehicle to express her emotions might be helpful as opposed to kind of collecting up and then spurting out the form of crying. She feels that this is awakened she's out of control but she cries. I suspect she holds back and has defenses to really deal with how emotionally she feels. Today is her to please return to see me in 2 months with her husband when we she'll get his perspective. In the meanwhile I will consider looking for various therapist  or cognitively based and is organically based as possible. But the reality is a think this patient needs an opportunity to express herself emotional. This patient is not suicidal. She is medically stable. She'll return to see me in 2 months we'll we will reevaluate her and ideally select a therapist who accepts Medicare. In the meantime the patient will be having some further neuropsych testing done by Dr.Zelson. I will attempt to look at these results before her next visit.   Haskel Schroeder, MD 4/27/20174:00 PM

## 2015-09-20 ENCOUNTER — Ambulatory Visit: Payer: Medicare Other | Attending: Psychiatry | Admitting: Psychology

## 2015-09-20 DIAGNOSIS — G3184 Mild cognitive impairment, so stated: Secondary | ICD-10-CM

## 2015-09-20 DIAGNOSIS — F0670 Mild neurocognitive disorder due to known physiological condition without behavioral disturbance: Secondary | ICD-10-CM

## 2015-09-20 DIAGNOSIS — F09 Unspecified mental disorder due to known physiological condition: Secondary | ICD-10-CM

## 2015-09-20 DIAGNOSIS — F4321 Adjustment disorder with depressed mood: Secondary | ICD-10-CM | POA: Insufficient documentation

## 2015-09-20 NOTE — Progress Notes (Signed)
[facility]  [address]   Telephone [phone] [address] Fax [phone] [place], [place] [address]  Initial Contact Note  Name:  Lindsay Clark Date of Birth; 10-26-1940 MRN:  [id number] Date:  09/20/2015  Lindsay Clark is an 75 y.o. female who self-referred for neuropsychological evaluation to investigate her report of persisting problems with divided attention and multitasking since having developed autoimmune encephalitis.   A total of 6 hours was spent today reviewing medical records, interviewing (CPT [phone]) Lindsay Clark and administering and scoring neurocognitive tests (CPT [id number] & [phone]).  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   A full report will follow once the planned testing has been completed. Her next appointment is scheduled for 09/28/15.    Jamey Ripa, Ph.D Licensed Psychologist 09/20/2015

## 2015-09-28 ENCOUNTER — Encounter: Payer: Self-pay | Admitting: Psychology

## 2015-09-28 ENCOUNTER — Ambulatory Visit: Payer: Medicare Other | Attending: Psychiatry | Admitting: Psychology

## 2015-09-28 DIAGNOSIS — F0670 Mild neurocognitive disorder due to known physiological condition without behavioral disturbance: Secondary | ICD-10-CM

## 2015-09-28 DIAGNOSIS — G3184 Mild cognitive impairment, so stated: Secondary | ICD-10-CM

## 2015-09-28 DIAGNOSIS — F09 Unspecified mental disorder due to known physiological condition: Secondary | ICD-10-CM | POA: Insufficient documentation

## 2015-09-28 DIAGNOSIS — F4322 Adjustment disorder with anxiety: Secondary | ICD-10-CM | POA: Diagnosis not present

## 2015-09-28 NOTE — Progress Notes (Addendum)
[facility]  [address]   Telephone [phone] [address] Fax [phone] [place], [place] [address]   [place]* This report should not be released without the consent of the client  Name:              Lindsay Clark Date of Birth:   May 28, 2040 [facility] MR#:   [id number] Dates of Evaluation: 09/20/15 & 09/28/15   Reason for Referral Lindsay Clark is a 75 year old right-handed woman who self-referred for neuropsychological evaluation to assess her report of cognitive difficulties in the setting of a suspected autoimmune inflammatory process. She initially reported onset of physical and cognitive symptoms in the fall of 2015. A brain MRI on 04/05/14 showed an area of cytotoxic edema affecting the left basal ganglia, putamen and caudate as well as moderate chronic microvascular ischemic white matter change. 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  Sources of Information Electronic medical records from the [facility] were reviewed. Lindsay Clark was interviewed.   History of Illness & Background In the Fall of 2015 she began to experience right hand weakness. Over the ensuing weeks she began to experience right hand dystonia, numbness involving the right hand and foot, involuntary movements of her right foot and ankle, reduced balance, difficulties with concentration and shifting attention, and heightened emotionality. These deficits compelled her to give up some everyday activities, such as playing the piano at church, preparing complex meals and leading a Environmental manager. Otherwise, she continued to maintain a fully independent lifestyle.   Lindsay Clark was initially evaluated by the undersigned clinician on 06/29/14 upon referral from her primary care  physician due to her report of concentration difficulties and emotional changes. At that time, she cited reduced right hand coordination, decreased gait, sensitivity to loud noise, tendency to become mentally and physically fatigued by the end of the day, sleep disturbance due to early morning awakening, reduced concentration (e.g., unable to finish reading materials), difficulty multi-tasking (e.g., difficulty preparing multiple dishes for a dinner party) and perceiving time as passing more slowly. She denied depression though reported that she had been crying tears of frustration when aware of not performing a task to her typical standards. She expressed frustration with the continued uncertainty about her neurological diagnosis. She asked about ways to improve her cognitive functioning; and she was advised to try an online cognitive training program, which decided not to do as she concluded after research that on-line cognitive training programs lack validity and would not have value for her. She impressed as a high achieving and detail oriented person who has valued being in control in life and a sense of competence in her abilities. She was also dealing with the stress of her husband's back injury and disability. She was given a diagnosis of Adjustment Disorder with Anxious Mood.  She was seen in two follow-up visits on 07/14/14 and 12/14/14 to assist with coping.   With regards to her social background, she lives with her husband of 54 years.  She reported that he has been recently disabled due to back problems. They have two adult children. 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 She reported having earned a Master's degree in  Civil engineer, contracting and a Conservator, museum/gallery in Clinical research associate. 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 She did not report history of developmental delays, head injury, loss of consciousness, seizure activity, exposure to toxic chemicals or substance abuse. She reported no history of emotional difficulties or mental health contacts prior to 2016. Her current medications included fluticasone, gabapentin, hydrochlorothiazide and ramipril.  Current Status  She continues to report experiencing involuntary movements of the right foot and right hand, numbness of her right hand fingers, right leg weakness with associated mild gait difficulty, occasional tendency to slur her words while speaking, diminished taste sensation, sensitivity to noise and to multiple visual stimuli (e.g., various clothing on a rack) resulting in feeling "overwhelmed", and tendency to fatigue by the early evening. With regards to her cognitive functioning, she continues to find it difficult to multi-task, such as preparing several food dishes. She was not aware of problems with focused attention or memory. She reported that when confronted by her physical or cognitive limitations, she tends to experience brief episodes of frustration and irritability that usually manifest as crying. She has been more irritable, especially at night. She otherwise did not report persisting emotional distress or behavioral disturbances, including sad mood, apathy, anhedonia, mood lability, hopelessness, suicidal or homicidal ideation, problems with impulse control, hallucinations or delusional ideas. Other than her health issues, her only other source of life stress has been her husband's ongoing back problems and associated disability. She reported that none of her above-mentioned symptoms or problems has worsened over time.  Evaluation Procedures In addition to a review of medical records and interview with Lindsay Clark, the following tests and questionnaires were administered: Animal Naming Test [employer] Anxiety Inventory    Beck Depression Inventory-II  [place]consin Verbal Learning Test-II Controlled Oral Word Association Test Finger Tapping Test Grooved Pegboard Test Modified [place] Card Sorting Test Paced Auditory Serial Addition Test Rey Complex Figure: Copy [employer] Figural Fluency Test Stroop Color & Word Test Trail Making A & B Wechsler Adult Intelligence Scale- IV:  Music therapist, Coding & Digit Span Wechsler Memory Scale-IV: Visual Reproduction I & II; Symbol Span  Wide Range Achievement Test-4: Word Reading  Assessment Results Observations, Validity & Interpretative Considerations She appeared as an appropriately groomed and dressed woman in no apparent physical distress. She did not exhibit any unusual motor activity or mannerisms. She spoke in a normal tone of voice, maintained good eye contact and responded to all questions. She was talkative to the point of needing to be interrupted at times. Her mood seemed irritable in context of her complaints about some health care providers but was otherwise euthymic. Her affect appeared within a wide range and was well-modulated. Her thought processes were coherent and organized without loose associations, verbal perseverations or flight of ideas. There were no indications of unusual thought content or delusional thinking.  It is probable that the test results reflected an underestimate of her cognitive functioning due to interference from emotional factors. Throughout the testing sessions, she made repeated negative comments about her performance or her dissatisfaction with the testing process itself. For example, she stated, "I'm tired of this", "I just don't care. I can't deal with it", "This (neuropsychological testing) will not tell me what I want to know" and "You're not giving me the tests that I want you to do". When memory testing was presented, she initially balked as she stated her belief that such tests were not relevant to her  situation. She also  criticized psychological symptom questionnaires as being inadequate measures of her psychological state. Several times she immediately described herself as feeling "overwhelmed" by test stimuli and then either refused to complete the test, warned that she will soon give up or most often declined to guess when encouraged.   Her pre-morbid verbal intellectual potential was estimated to fall within the High Average range based on her educational background and a measure of her word reading ability (Wide Range Achievement Test-4: Word Reading).   Her test scores were corrected to reflect norms for her age and whenever possible her gender and educational level (i.e., 18 years). A list of her test scores can be found at the end of this report.  Speed of Processing & Attention Her speed to transcribe symbols to match digits using a key (Wechsler Adult Intelligence Scale-IV (WAIS-IV) Coding) fell within the Average range. Her speed to draw lines to connect randomly arrayed numbers in sequence (Trails A) fell within the Low Average range. Her speed to read words out loud was within the Average range while her speed to name color hues fell within the Low Average range (Stroop Color & Word Test).   Shortly into her performance of a test that assessed speed of information processing and sustained concentration by requiring the continuous adding of pairs of digits presented numbers at various rates of speed (Paced Auditory Serial Addition Test), she became frustrated and refused to continue. Of note, she gave up halfway thru the first trial after having made relatively few errors.    Untimed measures of auditory working memory that required mentally rearranging digits in reverse or ascending order (WAIS-IV Digit Span) varied from the Average to Superior range. On a visual working memory test that required immediately recognizing symbols in left to right order (Wechsler Memory Scale-IV (WMS-IV) Symbol Span), she scored  at the lower boundary of the Average range. Of note, she reported feeling "overwhelmed" as the number of symbols to be recalled increased from two to three and thereafter pretty much refused to guess when unsure.    Learning & Memory Verbal memory was intact. Measures of her immediate recall of a list of sixteen unrelated words as well as her cumulative learning over five list presentations ([place]) both fell within the Very Superior range. Her immediate free recall of a second word list (i.e., five words) fell at the midpoint of the Average range, which was significantly lower than her immediate free recall of the initial list (i.e., 10 words), which suggested that prior verbal learning might adversely affect or interfere with subsequent learning. Her delayed memory was normal as after both short (three minutes) and long (twenty minutes) delays she freely recalled the exact number of words that she had initially learned.     Her ability to encode visual designs into memory was within the impaired range based on a test (WMS-IV Visual Reproduction I) that required her to draw (note: drawing skill was intact) figural designs immediately after viewing them. When more than one design was presented, she immediately described herself as "overwhelmed" and stated "I can't do this". She reproduced very few details of those designs. After an approximate twenty minute delay, she reproduced a below average number of figural design details though without signs of forgetting relative to her initial level of encoding. Her delayed recognition of the designs from multiple choices was above average.  Language Language was not extensively assessed. No qualitative problems were evident for speech articulation,  prosody, word selection, word finding, message coherence or language comprehension. Her phonemic fluency, based on her ability to generate words to designated letters (Controlled Oral Word  Association Test), fell at the upper boundary of the High Average range. A measure of semantic fluency (Animal Naming Test) was within the High Average range. Her ability to read words (Wide Range Achievement Test-4: Word Reading) likewise fell within the High Average range.  Visual-Spatial Perception & Organization There were no signs of spatial inattention or problems with visual recognition. Her score on a test of visuospatial organization that required assembly of two-dimensional block designs from models (WAIS-IV Block Design) fell within the Average range. Her copy of a spatially complex geometric figure (Rey Complex Figure) was within normal limits.      Fine Motor  She demonstrated consistent right hand preference. No problems with gross motor coordination were observed. Her fine motor speed (Finger Tapping Test) was within the Average range for both hands. Her dominant (right) hand tapping rate was faster than her left hand but less so than would be typical. Her eye-hand dexterity speed, as assessed by her speed to put grooved pegs into a formboard (Grooved Pegboard Test), was within the Low Average range for both hands. In this case, she demonstrated a greater than expected degree of slowing of her non-dominant left hand relative to her right hand.    Executive Function  Her speed to complete a test that required mental tracking and set shifting in order to complete an alternating and ascending letter to number sequence (Trails B) fell within the Low Average range. She demonstrated selective attention and inhibition of an automatic response as manifested by her average efficiency in naming the print color of a word while simultaneously ignoring the conflicting word (Stroop Color & Word Test). Measures of cognitive productivity under time pressure, be it naming members of a category Geologist, engineering Test), generating words from letter prompts (Controlled Oral Word Association Test) or producing  unique designs (Ruff Figural Fluency Test), were within normal expectations. Her ability to solve nonverbal problems that was dependent on inferential reasoning and conceptual flexibility (Modified [employer]) was within the Average range.  Emotional Status On the Beck Depression Inventory-II, her score of 14 fell within the mild range. Her most prominent symptom (i.e., 2 on a 0 - 3 scale) was irritability. Of note, she did not endorse having had any suicidal thoughts. On the [facility], she did not endorse any of the listed symptoms.  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  At this time, she reported experiencing persisting involuntary movements of the right foot and right hand, numbness of her right hand fingers, right leg weakness with associated mild gait difficulty, occasional tendency to slur her words while speaking, diminished taste sensation, sensitivity to noise and complex visual displays resulting in feeling "overwhelmed", tendency to fatigue by the early evening, difficulty with multi-tasking and tendency to become acutely frustrated and irritable when feeling overstimulated by her environment or upon becoming aware of cognitive difficulty.  Neuropsychological testing indicated relative weaknesses though not deficits on measures of mental processing speed, attentional shifting, encoding of symbolic visual information and bimanual eye-hand coordination speed. There were no indications of general cognitive decline. It is probable that the test results reflected an underestimate of her actual functioning due to interference from emotional  factors. She often described herself as "overwhelmed" immediately upon presentation of relatively complex stimuli or in reaction to cognitive  challenges that she anticipated being difficult for her; and then she would refuse to complete the test, warn that she will soon give up or most often decline to guess when encouraged. Her negative commentary throughout the testing process likely resulted in self-distraction.  In conclusion, her subjective report would seem to suggest that her cognitive processing is primarily disrupted by her susceptibility to sensory overload. It is possible that damage to subcortical nuclei caused by an autoimmune inflammatory process has disrupted her brain's efficiency in sorting, filtering and integrating incoming sensory information. In any case, the agitation that she experiences in anticipation of and during cognitive challenge likely exacerbates any underlying cognitive weaknesses.  Diagnostic Impressions . 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   Her reported difficulties with filtering and shifting attention likely combine to create sensory overload. She was advised to use strategies that might help her minimize sensory overload such as:  . wearing earplugs or sunglasses  . considering the lighting, ventilation, visual displays and colors in her home to possibly eliminate sensory overload  . removing non-essential items from a workplace . avoiding loud, bright, highly populated or chaotic environments whenever possible . avoiding use of caffeinated beverages before entering highly stimulating situations . spacing out tasks that might lead to sensory overload  . avoiding multi-tasking whenever possible . taking breaks prior to fatigue becoming troublesome  In addition, the importance of her better managing her  emotional reactions both in anticipation of and in response to cognitive challenges or situations that she perceives as overwhelming was discussed. She might benefit from using brief relaxation strategies when feeling "overwhelmed". She was advised to inhibit her tendency to make frustrated comments in the midst of a task in order to limit self-distraction. She should work on not berating herself for performing at a level below her standards, which likely will be a challenge for this high achieving and detail oriented person who values being in control. A psychotherapist might be able to assist her in these areas.    I have appreciated the opportunity to evaluate Ms. Izquierdo. Per her request, a copy of this report will be sent to her and her neurologist.     ______________________ Jamey Ripa, Ph.D Licensed Psychologist      Copies to: Maximiano Coss, MD; [facility]   [name], MD   Ms. Izora Gala Kinnamon         ADDENDUM-NEUROPSYCHOLOGICAL TEST RESULTS  Animal Naming Test Score=   27 84th (adjusted for age, gender and educational level)   Wisconsin Verbal Learning Test-II  Raw score Percentile (adjusted for age)  Trial 1 Free recall 10 >99th     Trials 1 - 5 Free recall  99   97th      List B Free Recall   5   50th     Short-Delay Free Recall  14   94th     Short-Delay Cued Recall   14   84th     Long-Delay Free Recall  14   84th      Long-Delay Cued Recall   15   94th  Total intrusions  13    94th      Total repetitions   2   50th        Recognition Hits 14/16   32nd      Recognition False Positives   2   50th      Long Delay Forced choice recognition  100% normal   Controlled Oral Word Association Test Score=  58  words/ 1 repetition 91st  (adjusted for age, gender and educational level)   Finger Tapping Test R: 48 34th (adjusted for age, gender and educational level)  L:  71 61st  (adjusted for age,  gender and educational level)   Grooved Pegboard Test R:   82s  18th (adjusted for age, gender and educational level)  L:  111s   9th (adjusted for age, gender and educational level)   Modified [place] Card Sorting Test  Categories correct      6 69th (adjusted for age and education)  Perseverative errors     4 12th            Total errors      8 27th        Executive function composite   94 34th        Paced Auditory Serial Addition Test- score N/A (she declined to continue part way thru series 1)  Rey Complex Figure: Copy score = 34.5/36 72nd - 81st     Ruff Figural Fluency Test  Score Percentile Interpretative range  Total unique designs 65 36th (adjusted for age and education) Average   Error Ratio .01 49th (adjusted for age and education) Superior      Stroop Color Word Test  Score Residual % (adjusted for age and educational level)  Word   109    1 53rd         Color    64 13 14th        Color-Word    40 -1 47th         Trails A Score=  51s 0e  9th (adjusted for age, gender and educational level)  Trails B Score= 109s 0e 13th (adjusted for age, gender and educational level)   Wechsler Adult Intelligence Scale-IV Subtest Scaled Score Percentile  Block Design   9 37th   Digit Span               Forward               Backward               Sequencing 12   9 10 15  75th        37th      50th    95th   Coding   11 63rd    Wechsler Memory Scale-IV Subtest SS Percentile  Visual Reproduction I  4   2nd   Visual Reproduction II  7 16th      Symbol Span   8 25th    Wide Range Achievement Test-4  Subtest  Raw score Standard score Percentile  Word Reading 67/70  120 91st

## 2015-10-26 ENCOUNTER — Telehealth: Payer: Self-pay | Admitting: Internal Medicine

## 2015-10-26 NOTE — Telephone Encounter (Signed)
Patient wants to transfer care from [place] to [place] because she states her husband is Dr. Celesta Aver patient and liked him very much. Records have been placed on Dr. Celesta Aver desk for review.

## 2015-10-31 ENCOUNTER — Encounter: Payer: Self-pay | Admitting: Hematology and Oncology

## 2015-11-02 ENCOUNTER — Ambulatory Visit (INDEPENDENT_AMBULATORY_CARE_PROVIDER_SITE_OTHER): Payer: 59 | Admitting: Psychiatry

## 2015-11-02 ENCOUNTER — Encounter (HOSPITAL_COMMUNITY): Payer: Self-pay | Admitting: Psychiatry

## 2015-11-02 VITALS — BP 126/72 | HR 51 | Ht 65.0 in | Wt 194.8 lb

## 2015-11-02 DIAGNOSIS — F4323 Adjustment disorder with mixed anxiety and depressed mood: Secondary | ICD-10-CM | POA: Diagnosis not present

## 2015-11-02 NOTE — Progress Notes (Signed)
Patient ID: Lindsay Clark, female   DOB: [birth year], 75 y.o.   MRN: [id number]  Psychiatric Initial Adult Assessment   Patient Identification: Lindsay Clark MRN:  [id number] Date of Evaluation:  11/02/2015 Referral Source: Dr. [name] Chief Complaint:   frustrated Visit Diagnosis: Adjustment disorder with depressed mood  History of Present Illness: At this time the patient is doing well. She shows no differences. Today she is here for follow-up to talk about referrals. Today we referred her to Dr. Cathleen Fears. Generally the patient is the same. She still is physically limited and she is very opinionated and direct. Today reviewed her neuropsych testing done by Drs.Zelson . She also had a lot of questions about the AP meeting and its discussion about autoimmune encephalitis. (Hypo) Manic Symptoms:   Anxiety Symptoms:   Psychotic Symptoms:   PTSD Symptoms:   Past Psychiatric History:   Previous Psychotropic Medications: No   Substance Abuse History in the last 12 months:  No.  Consequences of Substance Abuse: Negative  Past Medical History:  Past Medical History  Diagnosis Date  . Stroke ([place])   . Chronic dermatitis   . Hypertension   . 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 [redacted] week gestation of pregnancy   . Stroke with cerebral ischemia ([place]) 10/20/2014  . Lymphoma ([place]) 10/20/2014  . Multiple lesions on CT of brain and spine     reports lesions from previous CT scan at [facility]     Past Surgical History  Procedure Laterality Date  . Tubal ligation  1980    Family Psychiatric History:   Family History:  Family History  Problem Relation Age of Onset  . Stroke Mother   . Colon cancer Mother   . Heart attack Father   . Emphysema Father   . Thyroid disease Son   . Thyroid disease Maternal Grandmother   . Cancer Maternal Grandmother     cervical   . Thrombosis Maternal Grandfather     Social History:   Social History    Social History  . Marital Status: Married    Spouse Name: [name]  . Number of Children: 2  . Years of Education: MAx2   Occupational History  . Retired Other   Social History Main Topics  . Smoking status: Never Smoker   . Smokeless tobacco: Never Used  . Alcohol Use: 0.0 oz/week    0 Standard drinks or equivalent per week     Comment: 4 glasses of wine weekly  . Drug Use: No  . Sexual Activity: No   Other Topics Concern  . None   Social History Narrative   Patient lives at home with her spouse.   Caffeine Use: 2-3 cups daily    Additional Social History:   Allergies:   Allergies  Allergen Reactions  . Amlodipine     04/19/15 Patient denies    Metabolic Disorder Labs: No results found for: HGBA1C, MPG No results found for: PROLACTIN No results found for: CHOL, TRIG, HDL, CHOLHDL, VLDL, LDLCALC   Current Medications: Current Outpatient Prescriptions  Medication Sig Dispense Refill  . ALTACE 5 MG capsule Take 5-15 mg by mouth 2 (two) times daily. Take 15mg  in the morning and 5mg  in the evening.    . clobetasol ointment (TEMOVATE) [id number] % Apply 1 application topically 2 (two) times daily as needed (dry skin).     . Clobetasol Propionate 0.05 % shampoo Apply 1 application topically daily as needed (  itching).     . fluocinonide cream (LIDEX) AB-123456789 % Apply 1 application topically daily as needed (itching).     . fluticasone (FLONASE) 50 MCG/ACT nasal spray Place 1 spray into both nostrils as needed for allergies or rhinitis.    . folic acid (FOLVITE) 1 MG tablet Take 1 mg by mouth daily.    Marland Kitchen gabapentin (NEURONTIN) 300 MG capsule Take 300 mg by mouth at bedtime.     . hydrochlorothiazide (HYDRODIURIL) 25 MG tablet Take 25 mg by mouth every morning.     . methotrexate (RHEUMATREX) 2.5 MG tablet Take 2.5 mg by mouth once a week. Caution:Chemotherapy. Protect from light.    . Multiple Vitamins-Minerals (ONE-A-DAY WOMENS 50 PLUS PO) Take 1 tablet by mouth daily.     .  Omega-3 Fatty Acids (FISH OIL) 1000 MG CAPS Take 2 capsules by mouth daily.    . tacrolimus (PROTOPIC) 0.1 % ointment Apply 1 application topically 2 (two) times daily as needed (psoriasis).      No current facility-administered medications for this visit.    Neurologic: Headache: No Seizure: No Paresthesias:No  Musculoskeletal: Strength & Muscle Tone: decreased Gait & Station: ataxic Patient leans: Right  Psychiatric Specialty Exam: ROS  Blood pressure 126/72, pulse 51, height 5\' 5"  (1.651 m), weight 194 lb 12.8 oz (88.361 kg).Body mass index is 32.42 kg/(m^2).  General Appearance: Casual  Eye Contact:  Good  Speech:  Clear and Coherent  Volume:  Normal  Mood:  NA  Affect:  Appropriate  Thought Process:  Coherent  Orientation:  Full (Time, Place, and Person)  Thought Content:  WDL  Suicidal Thoughts:  No  Homicidal Thoughts:  No  Memory:  NA  Judgement:  Good  Insight:  Good  Psychomotor Activity:  Normal  Concentration:  Good  Recall:  Good  Fund of Knowledge:Good  Language: Fair  Akathisia:  No  Handed:  Right  AIMS (if indicated):    Assets:  Desire for Improvement  ADL's:  Intact  Cognition: WNL  Sleep:      Treatment Plan Summary:At this time the patient is stable. She shows no significant psychiatric illness. She's not suicidal psychotic or dysfunctional. In fact she's hyperfunctioning. She takes care of her husband who apparently has been ill recently. Unfortunately he did not come with her as we planned. Nonetheless it is evident the patient needs to be in some form of psychotherapy. I've referred her to Dr. Kinnie Scales  or Dr. Ebony Hail at  [facility]. The patient is stable as far as I can see. She needs no psychiatric medications at this time. Today we reviewed the roles of a psychiatrist and what they offer. This is an educational setting for this patient. This patient has no return appointment.

## 2015-11-09 ENCOUNTER — Encounter: Payer: Self-pay | Admitting: Internal Medicine

## 2015-11-09 NOTE — Telephone Encounter (Signed)
LM ON VMAIL TO CB TO SCHEDULE RECORDS IN REFERRAL FOLDER

## 2015-11-17 ENCOUNTER — Ambulatory Visit (INDEPENDENT_AMBULATORY_CARE_PROVIDER_SITE_OTHER): Payer: 59 | Admitting: Psychiatry

## 2015-11-17 DIAGNOSIS — F0281 Dementia in other diseases classified elsewhere with behavioral disturbance: Secondary | ICD-10-CM | POA: Diagnosis not present

## 2015-12-01 ENCOUNTER — Ambulatory Visit (INDEPENDENT_AMBULATORY_CARE_PROVIDER_SITE_OTHER): Payer: 59 | Admitting: Psychiatry

## 2015-12-01 DIAGNOSIS — F0281 Dementia in other diseases classified elsewhere with behavioral disturbance: Secondary | ICD-10-CM | POA: Diagnosis not present

## 2015-12-02 ENCOUNTER — Telehealth (HOSPITAL_COMMUNITY): Payer: Self-pay

## 2015-12-14 ENCOUNTER — Telehealth (HOSPITAL_COMMUNITY): Payer: Self-pay

## 2015-12-14 ENCOUNTER — Encounter: Payer: Self-pay | Admitting: Diagnostic Neuroimaging

## 2015-12-15 ENCOUNTER — Telehealth: Payer: Self-pay | Admitting: Neurology

## 2015-12-15 ENCOUNTER — Encounter: Payer: Self-pay | Admitting: Neurology

## 2015-12-15 NOTE — Telephone Encounter (Signed)
Email forwarded to [employer].

## 2015-12-15 NOTE — Telephone Encounter (Signed)
Lindsay Clark Apr 02, 2041. She called needing to let Dr. Tomi Likens know she is sending a E mail through My Chart. She said it is time sensitive. Her # is [phone]. Thank you

## 2016-01-18 ENCOUNTER — Encounter: Payer: Self-pay | Admitting: Internal Medicine

## 2016-01-18 ENCOUNTER — Ambulatory Visit (INDEPENDENT_AMBULATORY_CARE_PROVIDER_SITE_OTHER): Payer: Medicare Other | Admitting: Internal Medicine

## 2016-01-18 VITALS — BP 102/70 | HR 80 | Ht 65.0 in | Wt 194.8 lb

## 2016-01-18 DIAGNOSIS — R195 Other fecal abnormalities: Secondary | ICD-10-CM | POA: Diagnosis not present

## 2016-01-18 DIAGNOSIS — R194 Change in bowel habit: Secondary | ICD-10-CM | POA: Diagnosis not present

## 2016-01-18 NOTE — Patient Instructions (Signed)
You have been scheduled for a colonoscopy. Please follow written instructions given to you at your visit today.  Please pick up your prep supplies at the pharmacy. If you use inhalers (even only as needed), please bring them with you on the day of your procedure.   I appreciate the opportunity to care for you. Carl Gessner, MD, FACG 

## 2016-01-18 NOTE — Progress Notes (Signed)
Lindsay Clark 75 y.o. [birth year] [id number]  Assessment & Plan:   Encounter Diagnoses  Name Primary?  . Change in stool caliber Yes  . Mucus in stool     Is a very nice lady with some change in stool caliber and some passage of mucus that has been very unsettling to her. It'Clark possible this represents some sort of significant change in her bowel with colorectal neoplasia in the differential, but irritable bowel syndrome, or perhaps an inflammatory bowel condition is possible as well. Her last colonoscopy was in 2009 that was unremarkable. She does have a family history of colon cancer but her mother was in her mid to late 85s when she contracted that, so that is not causing an increased risk stratification for this patient based upon current guidelines.  She did have some sort of a colitis endoscopically and on biopsies in 1998, but has not had subsequent problems. It may be useful to biopsy the colon again to try to understand this.  Given her signs and symptoms it is reasonable to pursue a colonoscopy. We will schedule this.The risks and benefits as well as alternatives of endoscopic procedure(Clark) have been discussed and reviewed. All questions answered. The patient agrees to proceed.  I appreciate the opportunity to care for this patient. CC: Lindsay Clark,Lindsay S, MD   Subjective:   Chief Complaint: Mucous in stool  HPI Is a very nice lady, several months ago she had a lot of mucus leak from her rectum while she was eating breakfast, 2 days after drinking a 4 8 ounce can of tomato juice. It was copious and soaked her clothes, and happened again the next day then a couple days later. She'Clark had a few off and on problems but not for a while since then. She also reports a diminished caliber of her stools. She does have some bleeding from the perianal rectal area because she is a chronic skin condition there, she had that biopsy but does not know exactly what it was, was told was nothing to  worry about. She'Clark had a little bit of anorexia. There is some anal soreness. She think she may have hemorrhoids. Previously followed by Dr. Earle Clark. She asked to see me after I performed a colonoscopy on her husband.  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 It'Clark been unsettling to her as is been difficult to diagnose this with certainty and she is seen in many many physicians. She also has some sort of a skin rash that she has been seeing dermatology at [facility] before, with itching particularly, perhaps more so than a skin rash. She is taking methotrexate for the autoimmune disorder.  She is planning a trip to [place] in 3 weeks with her husband.  Medications, allergies, past medical history, past surgical history, family history and social history are reviewed and updated in the EMR.  Review of Systems   Objective:   Physical Exam @BP  102/70 (BP Location: Left Arm, Patient Position: Sitting, Cuff Size: Normal)   Pulse 80   Ht 5\' 5"  (1.651 m)   Wt 194 lb 12.8 oz (88.4 kg)   BMI 32.42 kg/m @  General:  Well-developed, well-nourished and in no acute distress Eyes:  anicteric. Lungs: Clear to auscultation bilaterally. Heart:  S1S2, no rubs, murmurs, gallops. Abdomen:  soft, non-tender, no hepatosplenomegaly, hernia, or mass and BS+.  Rectal: Extremities:   no edema, cyanosis or clubbing Skin:  Hypopigmented pinkish thickened perianal skin on left area with small ulceration at edge Neuro:  A&O x 3.  Psych:  appropriate mood and  Affect.   Data Reviewed:  Recent primary care notes. Dr. Dema Clark. Hemoglobin 13.7 in April of this year. Colonoscopy report and images, March 2009. Normal colonoscopy. Colonoscopy 2004, normal. 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

## 2016-02-06 ENCOUNTER — Encounter: Payer: Self-pay | Admitting: Internal Medicine

## 2016-02-17 ENCOUNTER — Encounter: Payer: Self-pay | Admitting: Internal Medicine

## 2016-03-01 ENCOUNTER — Encounter: Payer: Self-pay | Admitting: Internal Medicine

## 2016-03-01 ENCOUNTER — Ambulatory Visit (AMBULATORY_SURGERY_CENTER): Payer: Medicare Other | Admitting: Internal Medicine

## 2016-03-01 VITALS — BP 121/76 | HR 68 | Temp 97.5°F | Resp 21 | Ht 65.0 in | Wt 194.0 lb

## 2016-03-01 DIAGNOSIS — D124 Benign neoplasm of descending colon: Secondary | ICD-10-CM

## 2016-03-01 DIAGNOSIS — K621 Rectal polyp: Secondary | ICD-10-CM | POA: Diagnosis not present

## 2016-03-01 DIAGNOSIS — D128 Benign neoplasm of rectum: Secondary | ICD-10-CM

## 2016-03-01 DIAGNOSIS — R194 Change in bowel habit: Secondary | ICD-10-CM | POA: Diagnosis present

## 2016-03-01 MED ORDER — SODIUM CHLORIDE 0.9 % IV SOLN: 500.0000 mL | INTRAVENOUS | Status: DC

## 2016-03-01 NOTE — Op Note (Signed)
[place] Patient Name: Lindsay Clark Procedure Date: 03/01/2016 9:39 AM MRN: [id number] Endoscopist: Gatha Mayer , MD Age: 74 Referring MD:  Date of Birth: 25-Feb-1941 Gender: Female Account #: [id number] Procedure:                Colonoscopy Indications:              Change in bowel habits Medicines:                Propofol per Anesthesia, Monitored Anesthesia Care Procedure:                Pre-Anesthesia Assessment:                           - Prior to the procedure, a History and Physical                            was performed, and patient medications and                            allergies were reviewed. The patient's tolerance of                            previous anesthesia was also reviewed. The risks                            and benefits of the procedure and the sedation                            options and risks were discussed with the patient.                            All questions were answered, and informed consent                            was obtained. Prior Anticoagulants: The patient has                            taken no previous anticoagulant or antiplatelet                            agents. ASA Grade Assessment: III - A patient with                            severe systemic disease. After reviewing the risks                            and benefits, the patient was deemed in                            satisfactory condition to undergo the procedure.                           After obtaining informed consent, the colonoscope  was passed under direct vision. Throughout the                            procedure, the patient's blood pressure, pulse, and                            oxygen saturations were monitored continuously. The                            Model CF-HQ190L [id number]) scope was introduced                            through the anus and advanced to the the cecum,                            identified  by appendiceal orifice and ileocecal                            valve. The colonoscopy was technically difficult                            and complex due to significant looping. Successful                            completion of the procedure was aided by changing                            the patient's position and applying abdominal                            pressure. The ileocecal valve and the rectum were                            photographed. Cecum seen from the level of the IC                            valve. Used forceps to manipulate. The quality of                            the bowel preparation was good. Scope In: 9:53:33 AM Scope Out: 10:12:41 AM Scope Withdrawal Time: 0 hours 9 minutes 49 seconds  Total Procedure Duration: 0 hours 19 minutes 8 seconds  Findings:                 The perianal and digital rectal examinations were                            normal.                           A 5 mm polyp was found in the rectum. The polyp was                            sessile. The polyp was removed  with a cold snare.                            Resection and retrieval were complete. Verification                            of patient identification for the specimen was                            done. Estimated blood loss was minimal.                           A 2 mm polyp was found in the descending colon. The                            polyp was sessile. The polyp was removed with a                            cold biopsy forceps. Resection and retrieval were                            complete. Verification of patient identification                            for the specimen was done. Estimated blood loss was                            minimal.                           The exam was otherwise without abnormality on                            direct and retroflexion views. Complications:            No immediate complications. Estimated Blood Loss:     Estimated blood  loss was minimal. Impression:               - One 5 mm polyp in the rectum, removed with a cold                            snare. Resected and retrieved.                           - One 2 mm polyp in the descending colon, removed                            with a cold biopsy forceps. Resected and retrieved.                           - The examination was otherwise normal on direct                            and retroflexion views. Recommendation:           -  Patient has a contact number available for                            emergencies. The signs and symptoms of potential                            delayed complications were discussed with the                            patient. Return to normal activities tomorrow.                            Written discharge instructions were provided to the                            patient.                           - Resume previous diet.                           - Continue present medications.                           - Await pathology results.                           - PROBABLY DOES NOT NEED A ROUTINE REPEAT                            COLONOSCOPY - AWAIT PATHOLOGY REVIEW                           - Repeat colonoscopy is recommended. The                            colonoscopy date will be determined after pathology                            results from today's exam become available for                            review. Gatha Mayer, MD 03/01/2016 10:25:53 AM This report has been signed electronically.

## 2016-03-01 NOTE — Patient Instructions (Addendum)
   I found and removed 2 small polyps that are not a problem and won't be since removed.  I appreciate the opportunity to care for you. Gatha Mayer, MD, FACG  YOU HAD AN ENDOSCOPIC PROCEDURE TODAY AT [facility]:   Refer to the procedure report that was given to you for any specific questions about what was found during the examination.  If the procedure report does not answer your questions, please call your gastroenterologist to clarify.  If you requested that your care partner not be given the details of your procedure findings, then the procedure report has been included in a sealed envelope for you to review at your convenience later.  YOU SHOULD EXPECT: Some feelings of bloating in the abdomen. Passage of more gas than usual.  Walking can help get rid of the air that was put into your GI tract during the procedure and reduce the bloating. If you had a lower endoscopy (such as a colonoscopy or flexible sigmoidoscopy) you may notice spotting of blood in your stool or on the toilet paper. If you underwent a bowel prep for your procedure, you may not have a normal bowel movement for a few days.  Please Note:  You might notice some irritation and congestion in your nose or some drainage.  This is from the oxygen used during your procedure.  There is no need for concern and it should clear up in a day or so.  SYMPTOMS TO REPORT IMMEDIATELY:   Following lower endoscopy (colonoscopy or flexible sigmoidoscopy):  Excessive amounts of blood in the stool  Significant tenderness or worsening of abdominal pains  Swelling of the abdomen that is new, acute  Fever of 100F or higher  For urgent or emergent issues, a gastroenterologist can be reached at any hour by calling [phone].  DIET:  We do recommend a small meal at first, but then you may proceed to your regular diet.  Drink plenty of fluids but you should avoid alcoholic beverages for 24 hours.  ACTIVITY:  You  should plan to take it easy for the rest of today and you should NOT DRIVE or use heavy machinery until tomorrow (because of the sedation medicines used during the test).    FOLLOW UP: Our staff will call the number listed on your records the next business day following your procedure to check on you and address any questions or concerns that you may have regarding the information given to you following your procedure. If we do not reach you, we will leave a message.  However, if you are feeling well and you are not experiencing any problems, there is no need to return our call.  We will assume that you have returned to your regular daily activities without incident.  If any biopsies were taken you will be contacted by phone or by letter within the next 1-3 weeks.  Please call us at [phone] if you have not heard about the biopsies in 3 weeks.   SIGNATURES/CONFIDENTIALITY: You and/or your care partner have signed paperwork which will be entered into your electronic medical record.  These signatures attest to the fact that that the information above on your After Visit Summary has been reviewed and is understood.  Full responsibility of the confidentiality of this discharge information lies with you and/or your care-partner.  Please read over handout about polyps  Please continue your normal medications

## 2016-03-01 NOTE — Progress Notes (Signed)
Called to room to assist during endoscopic procedure.  Patient ID and intended procedure confirmed with present staff. Received instructions for my participation in the procedure from the performing physician.  

## 2016-03-01 NOTE — Progress Notes (Signed)
Report to PACU, RN, vss, BBS= Clear.  

## 2016-03-02 ENCOUNTER — Telehealth: Payer: Self-pay

## 2016-03-02 NOTE — Telephone Encounter (Signed)
  Follow up Call-  Call back number 03/01/2016  Post procedure Call Back phone  # [phone]  Permission to leave phone message Yes  Some recent data might be hidden     Patient questions:  Do you have a fever, pain , or abdominal swelling? No. Pain Score  0 *  Have you tolerated food without any problems? Yes.    Have you been able to return to your normal activities? Yes.    Do you have any questions about your discharge instructions: Diet   No. Medications  No. Follow up visit  No.  Do you have questions or concerns about your Care? No.  Actions: * If pain score is 4 or above: No action needed, pain <4.

## 2016-03-06 ENCOUNTER — Encounter: Payer: Self-pay | Admitting: Internal Medicine

## 2016-03-06 NOTE — Progress Notes (Signed)
Tubular adenoma and mucosal polyp No recall necessary - age

## 2016-04-18 ENCOUNTER — Observation Stay (HOSPITAL_COMMUNITY): Payer: Medicare Other

## 2016-04-18 ENCOUNTER — Other Ambulatory Visit (HOSPITAL_COMMUNITY): Payer: Self-pay

## 2016-04-18 ENCOUNTER — Observation Stay (HOSPITAL_COMMUNITY)
Admission: EM | Admit: 2016-04-18 | Discharge: 2016-04-19 | Disposition: A | Discharge status: Home / Self Care | Payer: Medicare Other | Attending: Internal Medicine | Admitting: Internal Medicine

## 2016-04-18 ENCOUNTER — Emergency Department (HOSPITAL_COMMUNITY): Payer: Medicare Other

## 2016-04-18 ENCOUNTER — Encounter (HOSPITAL_COMMUNITY): Payer: Self-pay | Admitting: Emergency Medicine

## 2016-04-18 DIAGNOSIS — M19042 Primary osteoarthritis, left hand: Secondary | ICD-10-CM | POA: Insufficient documentation

## 2016-04-18 DIAGNOSIS — E785 Hyperlipidemia, unspecified: Secondary | ICD-10-CM | POA: Insufficient documentation

## 2016-04-18 DIAGNOSIS — M5136 Other intervertebral disc degeneration, lumbar region: Secondary | ICD-10-CM | POA: Diagnosis not present

## 2016-04-18 DIAGNOSIS — I110 Hypertensive heart disease with heart failure: Secondary | ICD-10-CM | POA: Insufficient documentation

## 2016-04-18 DIAGNOSIS — M19041 Primary osteoarthritis, right hand: Secondary | ICD-10-CM | POA: Diagnosis not present

## 2016-04-18 DIAGNOSIS — I493 Ventricular premature depolarization: Secondary | ICD-10-CM | POA: Insufficient documentation

## 2016-04-18 DIAGNOSIS — Z8673 Personal history of transient ischemic attack (TIA), and cerebral infarction without residual deficits: Secondary | ICD-10-CM | POA: Insufficient documentation

## 2016-04-18 DIAGNOSIS — I6782 Cerebral ischemia: Secondary | ICD-10-CM | POA: Insufficient documentation

## 2016-04-18 DIAGNOSIS — G0481 Other encephalitis and encephalomyelitis: Secondary | ICD-10-CM | POA: Insufficient documentation

## 2016-04-18 DIAGNOSIS — Z8572 Personal history of non-Hodgkin lymphomas: Secondary | ICD-10-CM | POA: Diagnosis not present

## 2016-04-18 DIAGNOSIS — I10 Essential (primary) hypertension: Secondary | ICD-10-CM | POA: Diagnosis not present

## 2016-04-18 DIAGNOSIS — I6523 Occlusion and stenosis of bilateral carotid arteries: Secondary | ICD-10-CM | POA: Insufficient documentation

## 2016-04-18 DIAGNOSIS — R9089 Other abnormal findings on diagnostic imaging of central nervous system: Secondary | ICD-10-CM | POA: Diagnosis present

## 2016-04-18 DIAGNOSIS — I5189 Other ill-defined heart diseases: Secondary | ICD-10-CM

## 2016-04-18 DIAGNOSIS — E876 Hypokalemia: Secondary | ICD-10-CM | POA: Diagnosis not present

## 2016-04-18 DIAGNOSIS — G259 Extrapyramidal and movement disorder, unspecified: Secondary | ICD-10-CM | POA: Insufficient documentation

## 2016-04-18 DIAGNOSIS — W19XXXA Unspecified fall, initial encounter: Secondary | ICD-10-CM | POA: Diagnosis present

## 2016-04-18 DIAGNOSIS — M359 Systemic involvement of connective tissue, unspecified: Secondary | ICD-10-CM | POA: Diagnosis not present

## 2016-04-18 DIAGNOSIS — I5032 Chronic diastolic (congestive) heart failure: Secondary | ICD-10-CM | POA: Diagnosis present

## 2016-04-18 DIAGNOSIS — M47892 Other spondylosis, cervical region: Secondary | ICD-10-CM | POA: Diagnosis not present

## 2016-04-18 DIAGNOSIS — R55 Syncope and collapse: Secondary | ICD-10-CM | POA: Diagnosis not present

## 2016-04-18 DIAGNOSIS — F419 Anxiety disorder, unspecified: Secondary | ICD-10-CM | POA: Diagnosis present

## 2016-04-18 DIAGNOSIS — I671 Cerebral aneurysm, nonruptured: Secondary | ICD-10-CM | POA: Insufficient documentation

## 2016-04-18 DIAGNOSIS — D8989 Other specified disorders involving the immune mechanism, not elsewhere classified: Secondary | ICD-10-CM | POA: Diagnosis not present

## 2016-04-18 LAB — URINALYSIS, ROUTINE W REFLEX MICROSCOPIC
BILIRUBIN URINE: NEGATIVE
Glucose, UA: NEGATIVE mg/dL
HGB URINE DIPSTICK: NEGATIVE
KETONES UR: NEGATIVE mg/dL
NITRITE: NEGATIVE
PROTEIN: NEGATIVE mg/dL
Specific Gravity, Urine: 1.01 (ref 1.005–1.030)
pH: 7 (ref 5.0–8.0)

## 2016-04-18 LAB — I-STAT CHEM 8, ED
BUN: 15 mg/dL (ref 6–20)
CALCIUM ION: 1.16 mmol/L (ref 1.15–1.40)
CREATININE: 0.7 mg/dL (ref 0.44–1.00)
Chloride: 93 mmol/L — ABNORMAL LOW (ref 101–111)
GLUCOSE: 102 mg/dL — AB (ref 65–99)
HCT: 42 % (ref 36.0–46.0)
HEMOGLOBIN: 14.3 g/dL (ref 12.0–15.0)
Potassium: 3.3 mmol/L — ABNORMAL LOW (ref 3.5–5.1)
Sodium: 138 mmol/L (ref 135–145)
TCO2: 35 mmol/L (ref 0–100)

## 2016-04-18 LAB — RAPID URINE DRUG SCREEN, HOSP PERFORMED
Amphetamines: NOT DETECTED
BENZODIAZEPINES: NOT DETECTED
Barbiturates: NOT DETECTED
Cocaine: NOT DETECTED
Opiates: NOT DETECTED
Tetrahydrocannabinol: NOT DETECTED

## 2016-04-18 LAB — DIFFERENTIAL
BASOS PCT: 0 %
Basophils Absolute: 0 10*3/uL (ref 0.0–0.1)
Eosinophils Absolute: 0.1 10*3/uL (ref 0.0–0.7)
Eosinophils Relative: 1 %
Lymphocytes Relative: 24 %
Lymphs Abs: 1.8 10*3/uL (ref 0.7–4.0)
MONO ABS: 0.5 10*3/uL (ref 0.1–1.0)
MONOS PCT: 7 %
NEUTROS ABS: 5 10*3/uL (ref 1.7–7.7)
Neutrophils Relative %: 68 %

## 2016-04-18 LAB — COMPREHENSIVE METABOLIC PANEL
ALT: 27 U/L (ref 14–54)
AST: 26 U/L (ref 15–41)
Albumin: 4 g/dL (ref 3.5–5.0)
Alkaline Phosphatase: 77 U/L (ref 38–126)
Anion gap: 9 (ref 5–15)
BUN: 11 mg/dL (ref 6–20)
CALCIUM: 9.6 mg/dL (ref 8.9–10.3)
CHLORIDE: 96 mmol/L — AB (ref 101–111)
CO2: 34 mmol/L — ABNORMAL HIGH (ref 22–32)
CREATININE: 0.59 mg/dL (ref 0.44–1.00)
Glucose, Bld: 104 mg/dL — ABNORMAL HIGH (ref 65–99)
Potassium: 3.4 mmol/L — ABNORMAL LOW (ref 3.5–5.1)
Sodium: 139 mmol/L (ref 135–145)
TOTAL PROTEIN: 7.1 g/dL (ref 6.5–8.1)
Total Bilirubin: 0.7 mg/dL (ref 0.3–1.2)

## 2016-04-18 LAB — CBC
HEMATOCRIT: 42.6 % (ref 36.0–46.0)
Hemoglobin: 14.4 g/dL (ref 12.0–15.0)
MCH: 30.5 pg (ref 26.0–34.0)
MCHC: 33.8 g/dL (ref 30.0–36.0)
MCV: 90.3 fL (ref 78.0–100.0)
Platelets: 224 10*3/uL (ref 150–400)
RBC: 4.72 MIL/uL (ref 3.87–5.11)
RDW: 13.1 % (ref 11.5–15.5)
WBC: 7.3 10*3/uL (ref 4.0–10.5)

## 2016-04-18 LAB — ETHANOL: Alcohol, Ethyl (B): <5 mg/dL (ref <5)

## 2016-04-18 LAB — TROPONIN I: Troponin I: <0.03 ng/mL (ref <0.03)

## 2016-04-18 LAB — APTT: aPTT: 31 seconds (ref 24–36)

## 2016-04-18 LAB — PROTIME-INR
INR: 0.93
Prothrombin Time: 12.5 seconds (ref 11.4–15.2)

## 2016-04-18 LAB — I-STAT TROPONIN, ED: TROPONIN I, POC: 0 ng/mL (ref 0.00–0.08)

## 2016-04-18 LAB — BRAIN NATRIURETIC PEPTIDE: B Natriuretic Peptide: 13.2 pg/mL (ref 0.0–100.0)

## 2016-04-18 MED ORDER — LORAZEPAM 2 MG/ML IJ SOLN: 0.0000 mg | Freq: Two times a day (BID) | INTRAMUSCULAR | Status: DC

## 2016-04-18 MED ORDER — ONDANSETRON HCL 4 MG/2ML IJ SOLN: 4.0000 mg | Freq: Four times a day (QID) | INTRAMUSCULAR | Status: DC | PRN

## 2016-04-18 MED ORDER — LORAZEPAM 1 MG PO TABS: 0.5000 mg | ORAL_TABLET | Freq: Every day | ORAL | Status: DC | PRN

## 2016-04-18 MED ORDER — FOLIC ACID 1 MG PO TABS
1.0000 mg | ORAL_TABLET | Freq: Every day | ORAL | Status: DC
  Filled 2016-04-18 (×2): qty 1

## 2016-04-18 MED ORDER — GADOBENATE DIMEGLUMINE 529 MG/ML IV SOLN: 20.0000 mL | Freq: Once | INTRAVENOUS | Status: DC | PRN

## 2016-04-18 MED ORDER — OMEGA-3-ACID ETHYL ESTERS 1 G PO CAPS
1.0000 g | ORAL_CAPSULE | Freq: Every day | ORAL | Status: DC
  Administered 2016-04-19: 1 g via ORAL
  Filled 2016-04-18: qty 1

## 2016-04-18 MED ORDER — ADULT MULTIVITAMIN W/MINERALS CH
1.0000 | ORAL_TABLET | Freq: Every day | ORAL | Status: DC
  Administered 2016-04-19: 1 via ORAL
  Filled 2016-04-18 (×2): qty 1

## 2016-04-18 MED ORDER — POLYVINYL ALCOHOL 1.4 % OP SOLN
1.0000 [drp] | Freq: Every day | OPHTHALMIC | Status: DC | PRN
  Filled 2016-04-18: qty 15

## 2016-04-18 MED ORDER — RAMIPRIL 5 MG PO CAPS
5.0000 mg | ORAL_CAPSULE | Freq: Two times a day (BID) | ORAL | Status: DC
  Administered 2016-04-19: 5 mg via ORAL
  Filled 2016-04-18: qty 2
  Filled 2016-04-18: qty 1
  Filled 2016-04-18: qty 2
  Filled 2016-04-18 (×2): qty 1

## 2016-04-18 MED ORDER — ENOXAPARIN SODIUM 40 MG/0.4ML ~~LOC~~ SOLN
40.0000 mg | SUBCUTANEOUS | Status: DC
  Filled 2016-04-18: qty 0.4

## 2016-04-18 MED ORDER — VITAMIN B-1 100 MG PO TABS
100.0000 mg | ORAL_TABLET | Freq: Every day | ORAL | Status: DC
  Administered 2016-04-19: 100 mg via ORAL
  Filled 2016-04-18 (×2): qty 1

## 2016-04-18 MED ORDER — NAPHAZOLINE-PHENIRAMINE 0.025-0.3 % OP SOLN
1.0000 [drp] | Freq: Every day | OPHTHALMIC | Status: DC | PRN
  Filled 2016-04-18: qty 5

## 2016-04-18 MED ORDER — CLOBETASOL PROPIONATE 0.05 % EX SHAM: 1.0000 "application " | MEDICATED_SHAMPOO | Freq: Every day | CUTANEOUS | Status: DC | PRN

## 2016-04-18 MED ORDER — GABAPENTIN 300 MG PO CAPS
300.0000 mg | ORAL_CAPSULE | Freq: Two times a day (BID) | ORAL | Status: DC
  Administered 2016-04-19: 300 mg via ORAL
  Filled 2016-04-18 (×2): qty 1

## 2016-04-18 MED ORDER — LORAZEPAM 2 MG/ML IJ SOLN: 1.0000 mg | Freq: Four times a day (QID) | INTRAMUSCULAR | Status: DC | PRN

## 2016-04-18 MED ORDER — POTASSIUM CHLORIDE 20 MEQ/15ML (10%) PO SOLN
20.0000 meq | Freq: Once | ORAL | Status: AC
  Administered 2016-04-19: 20 meq via ORAL
  Filled 2016-04-18: qty 15

## 2016-04-18 MED ORDER — THIAMINE HCL 100 MG/ML IJ SOLN: 100.0000 mg | Freq: Every day | INTRAMUSCULAR | Status: DC

## 2016-04-18 MED ORDER — CLOBETASOL PROPIONATE 0.05 % EX OINT: 1.0000 "application " | TOPICAL_OINTMENT | Freq: Two times a day (BID) | CUTANEOUS | Status: DC | PRN

## 2016-04-18 MED ORDER — HYDROCHLOROTHIAZIDE 25 MG PO TABS
25.0000 mg | ORAL_TABLET | Freq: Every day | ORAL | Status: DC
  Administered 2016-04-19: 25 mg via ORAL
  Filled 2016-04-18: qty 1

## 2016-04-18 MED ORDER — SODIUM CHLORIDE 0.9% FLUSH
3.0000 mL | Freq: Two times a day (BID) | INTRAVENOUS | Status: DC
  Administered 2016-04-18: 3 mL via INTRAVENOUS

## 2016-04-18 MED ORDER — FLUOCINONIDE 0.05 % EX CREA: 1.0000 "application " | TOPICAL_CREAM | Freq: Every day | CUTANEOUS | Status: DC | PRN

## 2016-04-18 MED ORDER — ZOLPIDEM TARTRATE 5 MG PO TABS
5.0000 mg | ORAL_TABLET | Freq: Every evening | ORAL | Status: DC | PRN
  Filled 2016-04-18: qty 1

## 2016-04-18 MED ORDER — LORAZEPAM 1 MG PO TABS: 1.0000 mg | ORAL_TABLET | Freq: Four times a day (QID) | ORAL | Status: DC | PRN

## 2016-04-18 MED ORDER — GADOBENATE DIMEGLUMINE 529 MG/ML IV SOLN
19.0000 mL | Freq: Once | INTRAVENOUS | Status: AC | PRN
  Administered 2016-04-18: 19 mL via INTRAVENOUS

## 2016-04-18 MED ORDER — CLOBETASOL PROPIONATE 0.05 % EX CREA
1.0000 "application " | TOPICAL_CREAM | Freq: Two times a day (BID) | CUTANEOUS | Status: DC | PRN
  Filled 2016-04-18: qty 15

## 2016-04-18 MED ORDER — ACETAMINOPHEN 325 MG PO TABS: 650.0000 mg | ORAL_TABLET | Freq: Four times a day (QID) | ORAL | Status: DC | PRN

## 2016-04-18 MED ORDER — HYDROXYZINE HCL 10 MG PO TABS
10.0000 mg | ORAL_TABLET | Freq: Three times a day (TID) | ORAL | Status: DC | PRN
  Filled 2016-04-18: qty 1

## 2016-04-18 MED ORDER — ACETAMINOPHEN 650 MG RE SUPP: 650.0000 mg | Freq: Four times a day (QID) | RECTAL | Status: DC | PRN

## 2016-04-18 MED ORDER — SODIUM CHLORIDE 0.9 % IV SOLN
INTRAVENOUS | Status: DC
  Administered 2016-04-18: 22:00:00 via INTRAVENOUS

## 2016-04-18 MED ORDER — LORAZEPAM 2 MG/ML IJ SOLN
0.0000 mg | Freq: Four times a day (QID) | INTRAMUSCULAR | Status: DC
Start: 2016-04-18 — End: 2016-04-19

## 2016-04-18 MED ORDER — ONDANSETRON HCL 4 MG PO TABS: 4.0000 mg | ORAL_TABLET | Freq: Four times a day (QID) | ORAL | Status: DC | PRN

## 2016-04-18 NOTE — ED Triage Notes (Signed)
Husband stated, Her speech is slurred sometimes.

## 2016-04-18 NOTE — ED Triage Notes (Signed)
Pt. Stated, My left foot goes over the right  And I can correct it when I think about it.

## 2016-04-18 NOTE — ED Provider Notes (Signed)
[facility] DEPT Provider Note   CSN: [id number] Arrival date & time: 04/18/16  1116     History   Chief Complaint Chief Complaint  Patient presents with  . Fall  . Weakness    HPI Lindsay Clark is a 75 y.o. female.  The history is provided by the patient, the spouse and medical records. No language interpreter was used.   Patient is a 75 year old female with a past medical history of neuro-encephalitis who presents today with increased frequency of falls and episode of syncope that occurred last night. Patient states that yesterday evening she had an episode while walking in her home where her right foot crossed over her left foot and she fell forward. Denies loss of Consciousness associated with this. Was able to get back up. States that later on that evening she had stood up and had symptoms of lightheadedness, "woozy", subsequent syncope. States she hit the floor and her daughter-in-law was present for this. Duration of episode was approximately 30 seconds. During that time she states she belched up some of the wine that she had been drinking. States she may have had approximately 4 ounces. Denies any new weakness or numbness associated with this. Patient does have persistent right-sided weakness secondary to no encephalitis in this is been an ongoing problem for 2 years. She's been going to physical therapy for this. She is followed by a neurologist in [place]. She denies any recent fever, chills, nausea, vomiting, diarrhea, chest pain, shortness of breath, new or increased swelling in the legs. She does admit to eating and drinking less than she typically does yesterday as she was spending time at her granddaughter's birthday party in [place]. She is concerned that she had a TIA today.   Past Medical History:  Diagnosis Date  . [redacted] week gestation of pregnancy   . Aneurysm, cerebral   . Atherosclerosis    mild cerebral  . 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 Cervical disc  disease    with spondylosis  . Cervical spondylosis   . Chronic dermatitis    eczematous  . Colitis   . DDD (degenerative disc disease), lumbar   . Frozen shoulder    left  . Hyperlipidemia   . Hypertension   . Lateral epicondylitis   . Lumbar spondylosis   . 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 Movement disorder   . Multiple lesions on CT of brain and spine    reports lesions from previous CT scan at [facility]   . 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 Stroke ([place])   . 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 Syncope 04/18/2016  . Fall 04/18/2016  . Anxiety 04/18/2016  . Chronic diastolic CHF (congestive heart failure) ([place]) 04/18/2016  . Hypokalemia 04/18/2016  . Hypertension   . Autoimmune disorder-autoimmune encephalitis   . Coronary artery calcification of native artery 11/04/2014  . Abnormal finding on MRI of brain 10/04/2014    Past Surgical History:  Procedure Laterality Date  . COLONOSCOPY    . EYE SURGERY Left   . TUBAL LIGATION  1980    OB History    No data available       Home Medications    Prior to Admission medications   Medication Sig Start Date End Date Taking? Authorizing Provider  ALTACE 5 MG capsule Take 5 mg by mouth  2 (two) times daily.  07/05/14  Yes Historical Provider, MD  clobetasol cream (TEMOVATE) AB-123456789 % Apply 1 application topically 2 (two) times daily as needed (irritation).   Yes Historical Provider, MD  clobetasol ointment (TEMOVATE) AB-123456789 % Apply 1 application topically 2 (two) times daily as needed (dry skin).    Yes Historical Provider, MD  Clobetasol Propionate 0.05 % shampoo Apply 1 application topically daily as needed (itching).  05/07/14  Yes Historical Provider, MD  fluocinonide cream (LIDEX) [id number] % Apply 1 application topically daily as needed (itching).  09/07/14  Yes Historical Provider, MD  furosemide (LASIX) 20 MG  tablet Take 20 mg by mouth daily. 04/12/16  Yes Historical Provider, MD  gabapentin (NEURONTIN) 300 MG capsule Take 300 mg by mouth 2 (two) times daily.    Yes Historical Provider, MD  hydrochlorothiazide (HYDRODIURIL) 25 MG tablet Take 25 mg by mouth every morning.    Yes Historical Provider, MD  hydrOXYzine (ATARAX/VISTARIL) 10 MG tablet Take 10 mg by mouth 3 (three) times daily as needed.   Yes Historical Provider, MD  LORazepam (ATIVAN) 0.5 MG tablet Take 0.5 mg by mouth daily as needed for anxiety. 03/12/16  Yes Historical Provider, MD  Multiple Vitamins-Minerals (ONE-A-DAY WOMENS 50 PLUS PO) Take 1 tablet by mouth daily.    Yes Historical Provider, MD  naphazoline-pheniramine (ALLERGY EYE) 0.025-0.3 % ophthalmic solution Place 1 drop into both eyes daily as needed for irritation.   Yes Historical Provider, MD  Omega-3 Fatty Acids (FISH OIL) 1000 MG CAPS Take 2 capsules by mouth daily.   Yes Historical Provider, MD  Propylene Glycol (SYSTANE BALANCE) 0.6 % SOLN Apply 1 drop to eye daily as needed (itching).   Yes Historical Provider, MD    Family History Family History  Problem Relation Age of Onset  . Stroke Mother   . Colon cancer Mother 90  . Heart attack Father   . Emphysema Father   . Thyroid disease Son   . Thyroid disease Maternal Grandmother   . Cancer Maternal Grandmother     cervical   . Thrombosis Maternal Grandfather     Social History Social History  Substance Use Topics  . Smoking status: Never Smoker  . Smokeless tobacco: Never Used  . Alcohol use 0.0 oz/week     Comment: 4 glasses of wine weekly     Allergies   Patient has no known allergies.   Review of Systems Review of Systems  Constitutional: Negative for chills and fever.  HENT: Negative for congestion and rhinorrhea.   Respiratory: Negative for shortness of breath and wheezing.   Gastrointestinal: Negative for abdominal pain, nausea and vomiting.  Genitourinary: Negative for dysuria and  hematuria.  Neurological: Positive for syncope (as described in HPI), weakness (persistent on R side, nothing new) and light-headedness (prior to syncope episode). Negative for numbness.  Psychiatric/Behavioral: Negative for agitation and confusion.     Physical Exam Updated Vital Signs BP (!) 124/49 (BP Location: Left Arm)   Pulse 69   Temp 98.6 F (37 C) (Oral)   Resp 16   Ht 5\' 4"  (1.626 m)   Wt 89.7 kg   SpO2 96%   BMI 33.94 kg/m   Physical Exam  Constitutional: She is oriented to person, place, and time. She appears well-developed and well-nourished. No distress.  HENT:  Head: Normocephalic and atraumatic.  Eyes: Conjunctivae and EOM are normal. Pupils are equal, round, and reactive to light.  Neck: Neck supple.  Cardiovascular: Normal rate and  regular rhythm.   No murmur heard. Pulmonary/Chest: Effort normal and breath sounds normal. No respiratory distress.  Abdominal: Soft. She exhibits no distension. There is no tenderness.  Musculoskeletal: She exhibits no edema.  Neurological: She is alert and oriented to person, place, and time. She displays no tremor. No cranial nerve deficit (CN II-XII intact) or sensory deficit. GCS eye subscore is 4. GCS verbal subscore is 5. GCS motor subscore is 6.  Decreased R side grip strength and R side plantar/dorsiflexion.   Skin: Skin is warm and dry.  Psychiatric: She has a normal mood and affect.  Nursing note and vitals reviewed.    ED Treatments / Results  Labs (all labs ordered are listed, but only abnormal results are displayed) Labs Reviewed  COMPREHENSIVE METABOLIC PANEL - Abnormal; Notable for the following:       Result Value   Potassium 3.4 (*)    Chloride 96 (*)    CO2 34 (*)    Glucose, Bld 104 (*)    All other components within normal limits  URINALYSIS, ROUTINE W REFLEX MICROSCOPIC - Abnormal; Notable for the following:    Leukocytes, UA TRACE (*)    Bacteria, UA RARE (*)    Squamous Epithelial / LPF 0-5 (*)     All other components within normal limits  I-STAT CHEM 8, ED - Abnormal; Notable for the following:    Potassium 3.3 (*)    Chloride 93 (*)    Glucose, Bld 102 (*)    All other components within normal limits  PROTIME-INR  APTT  CBC  DIFFERENTIAL  RAPID URINE DRUG SCREEN, HOSP PERFORMED  BRAIN NATRIURETIC PEPTIDE  BASIC METABOLIC PANEL  CBC  ETHANOL  TROPONIN I  I-STAT TROPOININ, ED    EKG  EKG Interpretation  Date/Time:  Wednesday April 18 2016 11:45:56 EST Ventricular Rate:  67 PR Interval:  182 QRS Duration: 94 QT Interval:  452 QTC Calculation: 477 R Axis:   62 Text Interpretation:  Normal sinus rhythm Possible Left atrial enlargement Borderline ECG No significant change since last tracing Confirmed by LITTLE MD, RACHEL [phone]) on 04/18/2016 3:05:14 PM       Radiology Ct Head Wo Contrast  Result Date: 04/18/2016 CLINICAL DATA:  Syncope.  History of lymphoma EXAM: CT HEAD WITHOUT CONTRAST TECHNIQUE: Contiguous axial images were obtained from the base of the skull through the vertex without intravenous contrast. COMPARISON:  Brain MRI September 30, 2014 FINDINGS: Brain: The ventricles are normal in size and configuration. There is no evident intracranial mass, hemorrhage, extra-axial fluid collection, or midline shift. There is evidence of prior abnormality involving portions of the internal and external capsules on the left as well as involving a portion of the posteromedial left lentiform nucleus. Elsewhere there is mild patchy decreased attenuation in the periventricular white matter of the centra semiovale bilaterally, stable. No acute infarct is identified in comparison with prior MR study. Vascular: There is no hyperdense vessel. Calcification is noted in each carotid siphon region. Skull: Bony calvarium appears intact. Sinuses/Orbits: Visualized paranasal sinuses are clear. Orbits appear symmetric bilaterally. Other: Mastoid air cells are clear. IMPRESSION: Areas of  decreased attenuation in the left basal ganglia as well as in the periventricular white matter remain stable. The stability of these lesions suggests prior infarcts in these areas, although demyelination or residua of previous lymphoma, treated, are differential considerations. No acute appearing infarct is evident. No hemorrhage or edema appreciable. There is calcification in each carotid siphon region. Electronically Signed  By: Lowella Grip III M.D.   On: 04/18/2016 17:09    Procedures Procedures (including critical care time)  Medications Ordered in ED Medications  clobetasol cream (TEMOVATE) [id number] % 1 application (not administered)  hydrOXYzine (ATARAX/VISTARIL) tablet 10 mg (not administered)  naphazoline-pheniramine (NAPHCON-A) 0.025-0.3 % ophthalmic solution 1 drop (not administered)  polyvinyl alcohol (LIQUIFILM TEARS) 1.4 % ophthalmic solution 1 drop (not administered)  omega-3 acid ethyl esters (LOVAZA) capsule 1 g (not administered)  ramipril (ALTACE) capsule 5 mg (not administered)  gabapentin (NEURONTIN) capsule 300 mg (not administered)  hydrochlorothiazide (HYDRODIURIL) tablet 25 mg (not administered)  0.9 %  sodium chloride infusion ( Intravenous New Bag/Given 04/18/16 2147)  LORazepam (ATIVAN) tablet 1 mg (not administered)    Or  LORazepam (ATIVAN) injection 1 mg (not administered)  thiamine (VITAMIN B-1) tablet 100 mg (not administered)    Or  thiamine (B-1) injection 100 mg (not administered)  folic acid (FOLVITE) tablet 1 mg (not administered)  multivitamin with minerals tablet 1 tablet (not administered)  LORazepam (ATIVAN) injection 0-4 mg (0 mg Intravenous Not Given 04/18/16 2223)    Followed by  LORazepam (ATIVAN) injection 0-4 mg (not administered)  sodium chloride flush (NS) 0.9 % injection 3 mL (not administered)  enoxaparin (LOVENOX) injection 40 mg (not administered)  acetaminophen (TYLENOL) tablet 650 mg (not administered)    Or  acetaminophen  (TYLENOL) suppository 650 mg (not administered)  ondansetron (ZOFRAN) tablet 4 mg (not administered)    Or  ondansetron (ZOFRAN) injection 4 mg (not administered)  zolpidem (AMBIEN) tablet 5 mg (not administered)  potassium chloride 20 MEQ/15ML (10%) solution 20 mEq (not administered)     Initial Impression / Assessment and Plan / ED Course  I have reviewed the triage vital signs and the nursing notes.  Pertinent labs & imaging results that were available during my care of the patient were reviewed by me and considered in my medical decision making (see chart for details).  Clinical Course     75 year old female with a past medical history as documented above presenting today with syncope that occurred last night. On my exam she has no new neurological deficits although she does have her prior known deficits on the right side. She is alert and oriented. No significant abnormalities noted on her labs today. She gives no history of infection that could've caused this. She does endorse a small amount on call intake around the time of onset, however I would not expect this to cause the degree of syncope that this caused.  I did obtain a CT of her head which does not seem to show any obvious significant changes compared to prior. I discussed with the hospitalist who is agreeable to admit the patient for further overnight telemetry. Patient stable at the time of admission.  Final Clinical Impressions(s) / ED Diagnoses   Final diagnoses:  Essential hypertension  Autoimmune disorder [facility])  Syncope    New Prescriptions Current Discharge Medication List       Theodosia Quay, MD 04/18/16 [place], MD 04/25/16 [phone]

## 2016-04-18 NOTE — ED Notes (Signed)
Pt returned from ct

## 2016-04-18 NOTE — ED Triage Notes (Signed)
Pt. Stated, yesterday and I fell yesterday and then last night I was sitting and talking and I had a glass of wine, and when I stood up I was very woozy and I reached for the counter and I fell and passed out maybe for 30 seconds. When I was half come to I vomited. I lifted myself off the floor and then I went to bed. So I decided to come today after breakfast. Here I am.  My head feels still a little woozy. I called my doctor and talked to OA this morning and they said to come here.  I had a MRI in October at [facility].

## 2016-04-18 NOTE — H&P (Addendum)
History and Physical    Lindsay Clark [id number] DOB: Nov 01, 1940 DOA: 04/18/2016  Referring MD/NP/PA:   PCP: Vidal Schwalbe, MD   Patient coming from:  The patient is coming from home.  At baseline, pt is independent for most of ADL.    Chief Complaint: fall and syncope  HPI: Lindsay Clark is a 75 y.o. 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  Pt state that she fell last night at about 8:00 PM when while walking in her home where her right foot crossed over her left foot and she fell forward. She did no have LOC and was able to get back up. States that later on that evening she had stood up and had symptoms of lightheadedness, "woozy", subsequently passed for out 30 seconds at about 10:30 PM. Per her husband, patient had episode of mild slurred speech. She states that she has a mild chronic right-sided weakness due to autoimmune encephalitis, which has not changed. She denies any head or neck injury. No vision change or hearing loss. Denies chest pain, palpitation, shortness of breath, cough, fever, chills, nausea, vomiting, abdominal pain or symptoms of UTI. She states that she drinks 4 to 5 ounces of wine each time, 4 times per week.   ED Course: pt was found to have negative UDS, WBC 7.3, potassium 3.4, creatinine normal, temperature normal, no tachycardia, oxygen saturation 98% on room air. Pt is placed on tele bed for obs.  # CT head showed areas of decreased attenuation in the left basal ganglia as well as in the periventricular white matter remain stable. The stability of these lesions suggests prior infarcts in these areas, although demyelination or residua of previous lymphoma, treated, are differential considerations.   Review of Systems:   General: no fevers, chills, no changes in body  weight HEENT: no blurry vision, hearing changes or sore throat Respiratory: no dyspnea, coughing, wheezing CV: no chest pain, no palpitations GI: no nausea, vomiting, abdominal pain, diarrhea, constipation GU: no dysuria, burning on urination, increased urinary frequency, hematuria  Ext: no leg edema Neuro: has mild right-sided weakness. No vision change or hearing loss. Has dizziness, syncope and fall Skin: no rash, no skin tear. MSK: No muscle spasm, no deformity, no limitation of range of movement in spin Heme: No easy bruising.  Travel history: No recent long distant travel.  Allergy: No Known Allergies  Past Medical History:  Diagnosis Date  . [redacted] week gestation of pregnancy   . Aneurysm, cerebral   . Atherosclerosis    mild cerebral  . Autoimmune disorder ([place])   . Cervical disc disease    with spondylosis  . Cervical spondylosis   . Chronic dermatitis    eczematous  . Colitis   . DDD (degenerative disc disease), lumbar   . Frozen shoulder    left  . Hyperlipidemia   . Hypertension   . Lateral epicondylitis   . Lumbar spondylosis   . 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 Movement disorder   . Multiple lesions on CT of brain and spine    reports lesions from previous CT scan at [facility]   . 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 Stroke ([place])   . Stroke with cerebral ischemia ([place]) 10/20/2014  . Thrombotic stroke involving middle cerebral artery [facility])     Past Surgical History:  Procedure Laterality Date  .  COLONOSCOPY    . EYE SURGERY Left   . TUBAL LIGATION  1980    Social History:  reports that she has never smoked. She has never used smokeless tobacco. She reports that she drinks alcohol. She reports that she does not use drugs.  Family History:  Family History  Problem Relation Age of Onset  . Stroke Mother   . Colon cancer Mother 52  . Heart attack Father   . Emphysema Father   . Thyroid disease Son   . Thyroid disease Maternal Grandmother   . Cancer Maternal  Grandmother     cervical   . Thrombosis Maternal Grandfather      Prior to Admission medications   Medication Sig Start Date End Date Taking? Authorizing Provider  ALTACE 5 MG capsule Take 5 mg by mouth 2 (two) times daily.  07/05/14  Yes Historical Provider, MD  clobetasol cream (TEMOVATE) [id number] % Apply 1 application topically 2 (two) times daily as needed (irritation).   Yes Historical Provider, MD  clobetasol ointment (TEMOVATE) [id number] % Apply 1 application topically 2 (two) times daily as needed (dry skin).    Yes Historical Provider, MD  Clobetasol Propionate 0.05 % shampoo Apply 1 application topically daily as needed (itching).  05/07/14  Yes Historical Provider, MD  fluocinonide cream (LIDEX) [id number] % Apply 1 application topically daily as needed (itching).  09/07/14  Yes Historical Provider, MD  furosemide (LASIX) 20 MG tablet Take 20 mg by mouth daily. 04/12/16  Yes Historical Provider, MD  gabapentin (NEURONTIN) 300 MG capsule Take 300 mg by mouth 2 (two) times daily.    Yes Historical Provider, MD  hydrochlorothiazide (HYDRODIURIL) 25 MG tablet Take 25 mg by mouth every morning.    Yes Historical Provider, MD  hydrOXYzine (ATARAX/VISTARIL) 10 MG tablet Take 10 mg by mouth 3 (three) times daily as needed.   Yes Historical Provider, MD  LORazepam (ATIVAN) 0.5 MG tablet Take 0.5 mg by mouth daily as needed for anxiety. 03/12/16  Yes Historical Provider, MD  Multiple Vitamins-Minerals (ONE-A-DAY WOMENS 50 PLUS PO) Take 1 tablet by mouth daily.    Yes Historical Provider, MD  naphazoline-pheniramine (ALLERGY EYE) 0.025-0.3 % ophthalmic solution Place 1 drop into both eyes daily as needed for irritation.   Yes Historical Provider, MD  Omega-3 Fatty Acids (FISH OIL) 1000 MG CAPS Take 2 capsules by mouth daily.   Yes Historical Provider, MD  Propylene Glycol (SYSTANE BALANCE) 0.6 % SOLN Apply 1 drop to eye daily as needed (itching).   Yes Historical Provider, MD    Physical Exam: Vitals:    04/18/16 1800 04/18/16 1830 04/18/16 1900 04/18/16 2053  BP: 121/56 (!) 116/52 120/64 130/57  Pulse: 65 70 67 71  Resp: 12 17 19 18   Temp:      TempSrc:      SpO2: 98% 99% 100% 97%  Weight:      Height:       General: Not in acute distress HEENT:       Eyes: PERRL, EOMI, no scleral icterus.       ENT: No discharge from the ears and nose, no pharynx injection, no tonsillar enlargement.        Neck: No JVD, no bruit, no mass felt. Heme: No neck lymph node enlargement. Cardiac: S1/S2, RRR, No murmurs, No gallops or rubs. Respiratory: No rales, wheezing, rhonchi or rubs. GI: Soft, nondistended, nontender, no rebound pain, no organomegaly, BS present. GU: No hematuria Ext: No pitting leg edema bilaterally.  2+DP/PT pulse bilaterally. Musculoskeletal: No joint deformities, No joint redness or warmth, no limitation of ROM in spin. Skin: No rashes.  Neuro: Alert, oriented X3, cranial nerves II-XII grossly intact, moves all extremities normally. Muscle strength 5/5 in all extremities, sensation to light touch intact. Brachial reflex 2+ bilaterally. Negative Babinski's sign. Normal finger to nose test. Psych: Patient is not psychotic, no suicidal or hemocidal ideation.  Labs on Admission: I have personally reviewed following labs and imaging studies  CBC:  Recent Labs Lab 04/18/16 1158 04/18/16 1239  WBC 7.3  --   NEUTROABS 5.0  --   HGB 14.4 14.3  HCT 42.6 42.0  MCV 90.3  --   PLT 224  --    Basic Metabolic Panel:  Recent Labs Lab 04/18/16 1158 04/18/16 1239  NA 139 138  K 3.4* 3.3*  CL 96* 93*  CO2 34*  --   GLUCOSE 104* 102*  BUN 11 15  CREATININE 0.59 0.70  CALCIUM 9.6  --    GFR: Estimated Creatinine Clearance: 67.9 mL/min (by C-G formula based on SCr of 0.7 mg/dL). Liver Function Tests:  Recent Labs Lab 04/18/16 1158  AST 26  ALT 27  ALKPHOS 77  BILITOT 0.7  PROT 7.1  ALBUMIN 4.0   No results for input(s): LIPASE, AMYLASE in the last 168 hours. No  results for input(s): AMMONIA in the last 168 hours. Coagulation Profile:  Recent Labs Lab 04/18/16 1158  INR 0.93   Cardiac Enzymes: No results for input(s): CKTOTAL, CKMB, CKMBINDEX, TROPONINI in the last 168 hours. BNP (last 3 results) No results for input(s): PROBNP in the last 8760 hours. HbA1C: No results for input(s): HGBA1C in the last 72 hours. CBG: No results for input(s): GLUCAP in the last 168 hours. Lipid Profile: No results for input(s): CHOL, HDL, LDLCALC, TRIG, CHOLHDL, LDLDIRECT in the last 72 hours. Thyroid Function Tests: No results for input(s): TSH, T4TOTAL, FREET4, T3FREE, THYROIDAB in the last 72 hours. Anemia Panel: No results for input(s): VITAMINB12, FOLATE, FERRITIN, TIBC, IRON, RETICCTPCT in the last 72 hours. Urine analysis:    Component Value Date/Time   COLORURINE YELLOW 04/18/2016 [place] 04/18/2016 1455   LABSPEC 1.010 04/18/2016 1455   PHURINE 7.0 04/18/2016 1455   GLUCOSEU NEGATIVE 04/18/2016 1455   HGBUR NEGATIVE 04/18/2016 1455   BILIRUBINUR NEGATIVE 04/18/2016 1455   KETONESUR NEGATIVE 04/18/2016 1455   PROTEINUR NEGATIVE 04/18/2016 1455   NITRITE NEGATIVE 04/18/2016 1455   LEUKOCYTESUR TRACE (A) 04/18/2016 1455   Sepsis Labs: @LABRCNTIP (procalcitonin:4,lacticidven:4) )No results found for this or any previous visit (from the past 240 hour(s)).   Radiological Exams on Admission: Ct Head Wo Contrast  Result Date: 04/18/2016 CLINICAL DATA:  Syncope.  History of lymphoma EXAM: CT HEAD WITHOUT CONTRAST TECHNIQUE: Contiguous axial images were obtained from the base of the skull through the vertex without intravenous contrast. COMPARISON:  Brain MRI September 30, 2014 FINDINGS: Brain: The ventricles are normal in size and configuration. There is no evident intracranial mass, hemorrhage, extra-axial fluid collection, or midline shift. There is evidence of prior abnormality involving portions of the internal and external capsules  on the left as well as involving a portion of the posteromedial left lentiform nucleus. Elsewhere there is mild patchy decreased attenuation in the periventricular white matter of the centra semiovale bilaterally, stable. No acute infarct is identified in comparison with prior MR study. Vascular: There is no hyperdense vessel. Calcification is noted in each carotid siphon region. Skull: Bony calvarium  appears intact. Sinuses/Orbits: Visualized paranasal sinuses are clear. Orbits appear symmetric bilaterally. Other: Mastoid air cells are clear. IMPRESSION: Areas of decreased attenuation in the left basal ganglia as well as in the periventricular white matter remain stable. The stability of these lesions suggests prior infarcts in these areas, although demyelination or residua of previous lymphoma, treated, are differential considerations. No acute appearing infarct is evident. No hemorrhage or edema appreciable. There is calcification in each carotid siphon region. Electronically Signed   By: Lowella Grip III M.D.   On: 04/18/2016 17:09     EKG: Independently reviewed.  Sinus rhythm, QTC 477, LAE, poor R-wave progression   Assessment/Plan Principal Problem:   Syncope Active Problems:   Abnormal finding on MRI of brain   Fall   Hypertension   Autoimmune disorder-autoimmune encephalitis   Anxiety   Chronic diastolic CHF (congestive heart failure) (HCC)   Hypokalemia   Syncope and fall: Etiology is not clear. Patient denies head or neck injury. CT head showed stable brain lesion, but no acute intracranial abnormalities. Differential diagnosis include alcohol intoxication, TIA/stroke, seizure, vasovagal syncope, orthostatic status. She has history of autoimmune encephalitis, not sure if this is related. UDS negative  - place on tele bed for obs ( I expect discharge within 24 hours) - Orthostatic vital signs  - MRI-brain - EEG - check alcohol level - hold lasix and gentle IVF, 100 cc/h of  NS - 2d echo - Neuro checks frequently - PT/OT eval and treat  Addendum: since the etiology is not clear. The TIA/Stroke is one of the potential diffrential diagnoses given her age, hx of hypertension and the new episode of slurred speech. MRI without contrast will be ordered to r/o stroke. Since pt has hx of autoimmune encephalitis, MRI with contrast will be also ordered to r/o progression of autoimmune encephalitis.   Autoimmune disorder-autoimmune encephalitis: pt has been followed up by neurologist, Dr. Deniece Ree in [facility]. Last seen was on 12/2015. Patient not taking any medications currently. She has an appointment on 04/2016.  -f/u MRI-brain -f/u with Dr. Deniece Ree in [name]  Hypertension: -continue Altace and HCTZ -hold lasix   Anxiety: -continue Ativan  Hypokalemia: K= 3.4 on admission. - Repleted  Chronic diastolic CHF (congestive heart failure) ([place]): 2-D echo on 04/13/14 showed EF 55-60% with grade 1 diastolic dysfunction. Patient does not have leg DVT. CHF is compensated on admission. -Hold Lasix -Check BNP  Alcohol drinking: She states that she drinks 4 to 5 ounces of wine each time, 4 time per week.  -start CiWA protocol  DVT ppx: SQ Lovenox Code Status: Full code Family Communication: Yes, patient's huaband at bed side Disposition Plan:  Anticipate discharge back to previous home environment Consults called:  None Admission status: Obs / tele   Date of Service 04/18/2016    Ivor Costa [facility] Pager [phone]  If 7PM-7AM, please contact night-coverage [email or web address] Password [facility] 04/18/2016, 9:13 PM

## 2016-04-19 ENCOUNTER — Observation Stay (HOSPITAL_COMMUNITY): Payer: Medicare Other

## 2016-04-19 ENCOUNTER — Observation Stay (HOSPITAL_BASED_OUTPATIENT_CLINIC_OR_DEPARTMENT_OTHER): Payer: Medicare Other

## 2016-04-19 DIAGNOSIS — E876 Hypokalemia: Secondary | ICD-10-CM | POA: Diagnosis not present

## 2016-04-19 DIAGNOSIS — W19XXXD Unspecified fall, subsequent encounter: Secondary | ICD-10-CM

## 2016-04-19 DIAGNOSIS — R55 Syncope and collapse: Secondary | ICD-10-CM

## 2016-04-19 LAB — CBC
HEMATOCRIT: 37 % (ref 36.0–46.0)
Hemoglobin: 12.5 g/dL (ref 12.0–15.0)
MCH: 30.6 pg (ref 26.0–34.0)
MCHC: 33.8 g/dL (ref 30.0–36.0)
MCV: 90.7 fL (ref 78.0–100.0)
Platelets: 180 10*3/uL (ref 150–400)
RBC: 4.08 MIL/uL (ref 3.87–5.11)
RDW: 13.3 % (ref 11.5–15.5)
WBC: 6 10*3/uL (ref 4.0–10.5)

## 2016-04-19 LAB — ECHOCARDIOGRAM COMPLETE
Height: 64 in
Weight: 3163.2 oz

## 2016-04-19 LAB — BASIC METABOLIC PANEL
Anion gap: 6 (ref 5–15)
BUN: 10 mg/dL (ref 6–20)
CHLORIDE: 100 mmol/L — AB (ref 101–111)
CO2: 32 mmol/L (ref 22–32)
Calcium: 8.4 mg/dL — ABNORMAL LOW (ref 8.9–10.3)
Creatinine, Ser: 0.55 mg/dL (ref 0.44–1.00)
GFR calc Af Amer: >60 mL/min (ref >60)
GFR calc non Af Amer: >60 mL/min (ref >60)
GLUCOSE: 91 mg/dL (ref 65–99)
POTASSIUM: 2.8 mmol/L — AB (ref 3.5–5.1)
Sodium: 138 mmol/L (ref 135–145)

## 2016-04-19 LAB — GLUCOSE, CAPILLARY: Glucose-Capillary: 82 mg/dL (ref 65–99)

## 2016-04-19 MED ORDER — POTASSIUM CHLORIDE CRYS ER 20 MEQ PO TBCR
40.0000 meq | EXTENDED_RELEASE_TABLET | ORAL | Status: AC
  Administered 2016-04-19 (×2): 40 meq via ORAL
  Filled 2016-04-19 (×2): qty 2

## 2016-04-19 NOTE — Discharge Instructions (Signed)
Syncope °Introduction °Syncope is when you lose temporarily pass out (faint). Signs that you may be about to pass out include: °· Feeling dizzy or light-headed. °· Feeling sick to your stomach (nauseous). °· Seeing all white or all black. °· Having cold, clammy skin. °If you passed out, get help right away. Call your local emergency services (911 in the [place] Do not drive yourself to the hospital. °Follow these instructions at home: °Pay attention to any changes in your symptoms. Take these actions to help with your condition: °· Have someone stay with you until you feel stable. °· Do not drive, use machinery, or play sports until your doctor says it is okay. °· Keep all follow-up visits as told by your doctor. This is important. °· If you start to feel like you might pass out, lie down right away and raise (elevate) your feet above the level of your heart. Breathe deeply and steadily. Wait until all of the symptoms are gone. °· Drink enough fluid to keep your pee (urine) clear or pale yellow. °· If you are taking blood pressure or heart medicine, get up slowly and spend many minutes getting ready to sit and then stand. This can help with dizziness. °· Take over-the-counter and prescription medicines only as told by your doctor. °Get help right away if: °· You have a very bad headache. °· You have unusual pain in your chest, tummy, or back. °· You are bleeding from your mouth or rectum. °· You have black or tarry poop (stool). °· You have a very fast or uneven heartbeat (palpitations). °· It hurts to breathe. °· You pass out once or more than once. °· You have jerky movements that you cannot control (seizure). °· You are confused. °· You have trouble walking. °· You are very weak. °· You have vision problems. °These symptoms may be an emergency. Do not wait to see if the symptoms will go away. Get medical help right away. Call your local emergency services (911 in the [place] Do not drive yourself to the hospital.    °This information is not intended to replace advice given to you by your health care provider. Make sure you discuss any questions you have with your health care provider. °Document Released: 09/26/2007 Document Revised: 09/15/2015 Document Reviewed: 12/22/2014 °© 2017 Elsevier ° °

## 2016-04-19 NOTE — Progress Notes (Signed)
OT Cancellation Note  Patient Details Name: Lindsay Clark MRN: [id number] DOB: [birth year]   Cancelled Treatment:    Reason Eval/Treat Not Completed: OT screened, no needs identified, will sign off (per PT, pt independent with mobility and with no OT needs). Please re-consult if needs change. Thank you for this referral.  Binnie Kand M.S., OTR/L Pager: [phone]  04/19/2016, 9:55 AM

## 2016-04-19 NOTE — Progress Notes (Signed)
EEG Completed; Results Pending  

## 2016-04-19 NOTE — Care Management Note (Signed)
Case Management Note  Patient Details  Name: Lindsay Clark MRN: [id number] Date of Birth: [birth year]  Subjective/Objective:                 Spoke to patient's spouse at bedside while patient was out of room at test. He states that patient has previous neuro condition that weakened right side. He declines further HH/ DME needs. He anticipates DC to home today.    Action/Plan:  No CM needs identified at this time. Anticipate DC  To home today with spouse.  Expected Discharge Date:                  Expected Discharge Plan:  Home/Self Care  In-House Referral:     Discharge planning Services  CM Consult  Post Acute Care Choice:    Choice offered to:  Spouse  DME Arranged:    DME Agency:     HH Arranged:  Patient Refused [place] Agency:     Status of Service:  Completed, signed off  If discussed at [employer] of Stay Meetings, dates discussed:    Additional Comments:  Carles Collet, RN 04/19/2016, 12:39 PM

## 2016-04-19 NOTE — Progress Notes (Signed)
Lindsay Clark to be D/C'd to home per MD order.  Discussed with the patient and all questions fully answered.  VSS, Skin clean, dry and intact without evidence of skin break down, no evidence of skin tears noted. IV catheter discontinued intact. Site without signs and symptoms of complications. Dressing and pressure applied.  An After Visit Summary was printed and given to the patient.   D/c education completed with patient/family including follow up instructions, medication list, d/c activities limitations if indicated, with other d/c instructions as indicated by MD - patient able to verbalize understanding, all questions fully answered.   Patient instructed to return to ED, call 911, or call MD for any changes in condition.   Patient escorted via [place], and D/C home via private auto.  [name] 04/19/2016 7:02 PM

## 2016-04-19 NOTE — Progress Notes (Signed)
NURSING PROGRESS NOTE  Lindsay Esfahani FogartyMRN: [id number] Admission Data: 04/18/2016 at [facility] Attending Provider: Ivor Costa, MD PCP: Vidal Schwalbe, MD Code status: Full  Allergies: No Known Allergies  Past Medical History:  Past Medical History:  Diagnosis Date  . [redacted] week gestation of pregnancy   . Aneurysm, cerebral   . Atherosclerosis    mild cerebral  . Autoimmune disorder ([place])   . Cervical disc disease    with spondylosis  . Cervical spondylosis   . Chronic dermatitis    eczematous  . Colitis   . DDD (degenerative disc disease), lumbar   . Frozen shoulder    left  . Hyperlipidemia   . Hypertension   . Lateral epicondylitis   . Lumbar spondylosis   . 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 Movement disorder   . Multiple lesions on CT of brain and spine    reports lesions from previous CT scan at [facility]   . 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 Stroke ([place])   . Stroke with cerebral ischemia ([place]) 10/20/2014  . Thrombotic stroke involving middle cerebral artery [facility])     Past Surgical History:  Past Surgical History:  Procedure Laterality Date  . COLONOSCOPY    . EYE SURGERY Left   . TUBAL LIGATION  1980   Lindsay Clark is a 75 y.o. female patient, arrived to floor in room [phone] via stretcher, transferred from ED. Patient alert and oriented X 4. No acute distress noted. Denies pain.  Vital signs: Oral temperature 98.6 F (37 C), Blood pressure 124/49, Pulse 69, RR 16, SpO2 96 % on room air.  Cardiac monitoring: Telemetry box 5W # 26 in place. Verified by Vladimir Faster B  IV access: Right forearm IV; condition patent and no redness.  Skin: intact, no pressure ulcer noted in sacral area. Bruise noted on right hip. Second verified by Vladimir Faster B  Patient's ID armband verified with patient/ family, and in place. Information packet given to patient/ family. Fall risk assessed, SR up X2, patient/ family able to verbalize understanding of risks associated with falls and to call  nurse or staff to assist before getting out of bed. Patient/ family oriented to room and equipment. Call bell within reach.

## 2016-04-19 NOTE — Procedures (Signed)
ELECTROENCEPHALOGRAM REPORT  Date of Study: 04/19/2016  Patient's Name: Lindsay Clark MRN: [id number] Date of Birth: [birth year]  Referring Provider: Dr. Ivor Costa  Clinical History: This is a 75 year old woman with a history of autoimmune encephalitis presenting with fall and syncope.  Medications: acetaminophen (TYLENOL) tablet [id number] mg  folic acid (FOLVITE) tablet 1 mg  gabapentin (NEURONTIN) capsule 300 mg  hydrochlorothiazide (HYDRODIURIL) tablet 25 mg  hydrOXYzine (ATARAX/VISTARIL) tablet 10 mg  LORazepam (ATIVAN) tablet 1 mg  multivitamin with minerals tablet 1 tablet  naphazoline-pheniramine (NAPHCON-A) 0.025-0.3 % ophthalmic solution 1 drop  omega-3 acid ethyl esters (LOVAZA) capsule 1 g  ramipril (ALTACE) capsule 5 mg  thiamine (B-1) injection 100 mg  thiamine (VITAMIN B-1) tablet 100 mg  zolpidem (AMBIEN) tablet 5 mg   Technical Summary: A multichannel digital EEG recording measured by the international 10-20 system with electrodes applied with paste and impedances below 5000 ohms performed in our laboratory with EKG monitoring in an awake and asleep patient.  Hyperventilation and photic stimulation were not performed.  The digital EEG was referentially recorded, reformatted, and digitally filtered in a variety of bipolar and referential montages for optimal display.    Description: The patient is awake and asleep during the recording.  During maximal wakefulness, there is a symmetric, medium voltage 9 Hz posterior dominant rhythm that poorly attenuates with eye opening and eye closure.  The record is symmetric.  During drowsiness and sleep, there is an increase in theta slowing of the background.  Vertex waves and symmetric sleep spindles were seen.  Hyperventilation and photic stimulation were not performed. There were no epileptiform discharges or electrographic seizures seen.    EKG lead was showed sinus bradycardia at 66bpm. Impression: This awake and asleep EEG is  normal.    Clinical Correlation: A normal EEG does not exclude a clinical diagnosis of epilepsy.  Clinical correlation is advised.   Ellouise Newer, M.D.

## 2016-04-19 NOTE — Discharge Summary (Signed)
Physician Discharge Summary  Lindsay Clark [id number] DOB: [birth year] DOA: 04/18/2016  PCP: Vidal Schwalbe, MD  Admit date: 04/18/2016 Discharge date: 04/19/2016  Admitted From: home Disposition:  home  Recommendations for Outpatient Follow-up:  1. Follow up with PCP in 1-2 weeks Please check potassium level and if low add supplements since pt is on both lasix and HCTZ.   Home Health:NONE Equipment/Devices: NONE  Discharge Condition: fair CODE STATUS: full code Diet recommendation: Heart Healthy   Discharge Diagnoses:  Principal Problem:   Syncope  Active Problems:   Hypokalemia   Abnormal finding on MRI of brain   Fall   Hypertension   Autoimmune disorder-autoimmune encephalitis   Anxiety   Chronic diastolic CHF (congestive heart failure) (HCC)  brief narrative/ HPI 75 y.o. 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  Pt state that she fell the previous night around 8:00 PM when while walking in her home where her right foot crossed over her left foot and she fell forward. She did no have LOC and was able to get back up. States that later  that evening she  stood up and had symptoms of lightheadedness, "woozy", subsequently passed for out 30 seconds at about 10:30 PM. Per her family, patient had episode of mild slurred speech. She states that she has a mild chronic right-sided weakness due to autoimmune encephalitis, which has not changed. She denies any head or neck injury. No vision change or hearing loss. Denies chest pain, palpitation, shortness of breath, cough, fever, chills, nausea, vomiting, abdominal pain or symptoms of UTI. She states that she drinks 4 to 5 ounces of wine each time, 4 times per week.   ED Course: pt had stable vitals, negative  UDS, WBC 7.3, potassium 3.4,  creatinine normal,  Head CT was unremarkable.  Pt  placed on tele bed for obs.  hospital course Syncope and fall:  Vasovagal vs orthostatic. Also alcohol use may have contributed to her dizziness. TIA seems unlikely although cannot rule out completely.  CT head showed stable brain lesion, but no acute intracranial abnormalities. Differential diagnosis include alcohol intoxication,  MRI brain shows Hyperintense T2 weighted signal surrounding the left lentiform nucleus, which is decreased compared to prior studies . No acute findings.  - stable on tele except for PVCs possibly due to hypokalemia - Orthostatic this morning was negative. -EEG was negative for seizure activity. - 2D echo showed Normal LV size and systolic function, EF 0000000. No wall motion abnormality.   Autoimmune disorder-autoimmune encephalitis:   followed up by neurologist, Dr. Deniece Ree in [facility]. Last seen was on 12/2015.  not on any medications currently. She has an appointment on 04/2016.  -MRI brain unremarkable for acute findings.   Hypertension: -continue Altace and HCTZ. Resume lasix.  Anxiety: -continue Ativan  Hypokalemia Possibly due to dehydration and diuretics. Replenished. instructed on diet rich on potassium. Should have her potassium checked during follow up and if low , add supplements.  Chronic diastolic CHF (congestive heart failure) ([place]):  Compensated. Lasix was held, resume on d/c. May benefit from being on baby aspirin. Pt would like to discuss with ehr PCP.  Alcohol use She states that she drinks 4 to 5 ounces of wine each time, 4 time per week.  -no withdrawal symptoms. instructed to cut down.   Family communication: husband at bedside  Consults: none  Disposition: home  Discharge Instructions   Allergies as of 04/19/2016   No Known Allergies     Medication List    TAKE these medications   ALLERGY EYE 0.025-0.3 % ophthalmic solution Generic drug:   naphazoline-pheniramine Place 1 drop into both eyes daily as needed for irritation.   ALTACE 5 MG capsule Generic drug:  ramipril Take 5 mg by mouth 2 (two) times daily.   clobetasol ointment 0.05 % Commonly known as:  TEMOVATE Apply 1 application topically 2 (two) times daily as needed (dry skin).   clobetasol cream 0.05 % Commonly known as:  TEMOVATE Apply 1 application topically 2 (two) times daily as needed (irritation).   Clobetasol Propionate 0.05 % shampoo Apply 1 application topically daily as needed (itching).   Fish Oil 1000 MG Caps Take 2 capsules by mouth daily.   fluocinonide cream 0.05 % Commonly known as:  LIDEX Apply 1 application topically daily as needed (itching).   furosemide 20 MG tablet Commonly known as:  LASIX Take 20 mg by mouth daily.   gabapentin 300 MG capsule Commonly known as:  NEURONTIN Take 300 mg by mouth 2 (two) times daily.   hydrochlorothiazide 25 MG tablet Commonly known as:  HYDRODIURIL Take 25 mg by mouth every morning.   hydrOXYzine 10 MG tablet Commonly known as:  ATARAX/VISTARIL Take 10 mg by mouth 3 (three) times daily as needed.   LORazepam 0.5 MG tablet Commonly known as:  ATIVAN Take 0.5 mg by mouth daily as needed for anxiety.   ONE-A-DAY WOMENS 50 PLUS PO Take 1 tablet by mouth daily.   SYSTANE BALANCE 0.6 % Soln Generic drug:  Propylene Glycol Apply 1 drop to eye daily as needed (itching).      Follow-up Information    WHITE,[name], MD Follow up in 1 week(s).   Specialty:  Family Medicine Contact information: [address] [place] [address] [phone]          No Known Allergies   Procedures/Studies: Ct Head Wo Contrast  Result Date: 04/18/2016 CLINICAL DATA:  Syncope.  History of lymphoma EXAM: CT HEAD WITHOUT CONTRAST TECHNIQUE: Contiguous axial images were obtained from the base of the skull through the vertex without intravenous contrast. COMPARISON:  Brain MRI September 30, 2014 FINDINGS: Brain: The ventricles are normal in size and configuration. There is no evident intracranial mass, hemorrhage, extra-axial fluid collection, or midline shift. There is evidence of prior abnormality involving portions of the internal and external capsules on the left as well as involving a portion of the posteromedial left lentiform nucleus. Elsewhere there is mild patchy decreased attenuation in the periventricular white matter of the centra semiovale bilaterally, stable. No acute infarct is identified in comparison with prior MR study. Vascular: There is no hyperdense vessel. Calcification is noted in each carotid siphon region. Skull: Bony calvarium appears intact. Sinuses/Orbits: Visualized paranasal sinuses are clear. Orbits appear symmetric bilaterally. Other: Mastoid air cells are clear. IMPRESSION: Areas of decreased attenuation in the left basal ganglia as well as in the periventricular white matter remain stable. The stability of these lesions suggests prior infarcts in these areas, although demyelination or residua of previous lymphoma, treated, are differential considerations. No acute appearing infarct is evident. No hemorrhage or edema appreciable. There is calcification in each carotid siphon region. Electronically Signed   By: Lowella Grip III M.D.   On: 04/18/2016 17:09   Mr Jeri Cos [id number] Contrast  Result Date: 04/18/2016 CLINICAL DATA:  History of autoimmune encephalitis.  Fall last night. Recent lightheadedness. EXAM: MRI HEAD WITHOUT CONTRAST TECHNIQUE: Multiplanar, multiecho pulse sequences of the brain and surrounding structures were obtained without intravenous contrast. COMPARISON:  Reports from outside brain MRIs performed at [facility] 01/28/2016 and 03/30/2015 Brain MRI 09/30/2014 Head CT 04/18/2016 FINDINGS: Brain: No focal diffusion restriction to indicate acute infarct. No intraparenchymal hemorrhage. There is beginning confluent hyperintense T2-weighted signal within  the periventricular white matter, most often seen in the setting of chronic microvascular ischemia. An area of more focal hyperintense T2 weighted signal surrounding the left lentiform nucleus is diminished compared to remote priors such as 09/30/2014 and 04/05/2014. No mass lesion or midline shift. No hydrocephalus or extra-axial fluid collection. The midline structures are normal. No age advanced or lobar predominant atrophy. No contrast-enhancing lesions. Vascular: Major intracranial arterial and venous sinus flow voids are preserved. No evidence of chronic microhemorrhage or amyloid angiopathy. 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 Skull and upper cervical spine: The visualized skull base, calvarium, upper cervical spine and extracranial soft tissues are normal. Sinuses/Orbits: No fluid levels or advanced mucosal thickening. No mastoid effusion. Normal orbits. IMPRESSION: 1. No acute intracranial abnormality. 2. Hyperintense T2 weighted signal surrounding the left lentiform nucleus, which is decreased compared to prior studies. This may be the sequela of prior inflammatory process, and would be in keeping with the reported history of autoimmune encephalitis. 3. Chronic microvascular ischemia. Electronically Signed   By: Ulyses Jarred M.D.   On: 04/18/2016 23:46    (Echo, Carotid, EGD, Colonoscopy, ERCP)    Subjective: Denies any symptoms this am.   Discharge Exam: Vitals:   04/19/16 0817 04/19/16 1354  BP: 130/62 (!) 114/50  Pulse:  76  Resp:  16  Temp:  97.7 F (36.5 C)   Vitals:   04/19/16 0644 04/19/16 0647 04/19/16 0817 04/19/16 1354  BP: 136/68 123/62 130/62 (!) 114/50  Pulse: 69 78  76  Resp:    16  Temp:    97.7 F (36.5 C)  TempSrc:    Oral  SpO2: 100% 100%  98%  Weight:      Height:        General: elderly female NAD HEENT: moist mucosa , supple neck CVS: RRR, S1/S2 +, no rubs, no gallops chest: CTA bilaterally, no wheezing, no rhonchi Abdominal:  Soft, NT, ND, bowel sounds + Musculoskeletal: warm, no edema CNS: alert and oriented, chr mild rt sided weakness. Non focal    The results of significant diagnostics from this hospitalization (including imaging, microbiology, ancillary and laboratory) are listed below for reference.     Microbiology: No results found for this or any previous visit (from the past 240 hour(s)).   Labs: BNP (last 3 results)  Recent Labs  04/18/16 2145  BNP [id number]   Basic Metabolic Panel:  Recent Labs Lab 04/18/16 1158 04/18/16 1239 04/19/16 0446  NA 139 138 138  K 3.4* 3.3* 2.8*  CL 96* 93* 100*  CO2 34*  --  32  GLUCOSE 104* 102* 91  BUN 11 15 10   CREATININE 0.59 0.70 0.55  CALCIUM 9.6  --  8.4*   Liver Function Tests:  Recent Labs Lab 04/18/16 1158  AST 26  ALT 27  ALKPHOS 77  BILITOT 0.7  PROT 7.1  ALBUMIN 4.0   No results for input(s): LIPASE, AMYLASE in the last 168 hours. No results for input(s): AMMONIA in the last 168 hours. CBC:  Recent Labs Lab 04/18/16 1158 04/18/16 1239 04/19/16 0446  WBC 7.3  --  6.0  NEUTROABS 5.0  --   --   HGB 14.4 14.3 12.5  HCT 42.6 42.0 37.0  MCV 90.3  --  90.7  PLT 224  --  180   Cardiac Enzymes:  Recent Labs Lab 04/18/16 2145  TROPONINI <0.03   BNP: Invalid input(s): POCBNP CBG:  Recent Labs Lab 04/19/16 0645  GLUCAP 82   D-Dimer No results for input(s): DDIMER in the last 72 hours. Hgb A1c No results for input(s): HGBA1C in the last 72 hours. Lipid Profile No results for input(s): CHOL, HDL, LDLCALC, TRIG, CHOLHDL, LDLDIRECT in the last 72 hours. Thyroid function studies No results for input(s): TSH, T4TOTAL, T3FREE, THYROIDAB in the last 72 hours.  Invalid input(s): FREET3 Anemia work up No results for input(s): VITAMINB12, FOLATE, FERRITIN, TIBC, IRON, RETICCTPCT in the last 72 hours. Urinalysis    Component Value Date/Time   COLORURINE YELLOW 04/18/2016 1455   APPEARANCEUR CLEAR 04/18/2016 1455    LABSPEC 1.010 04/18/2016 1455   PHURINE 7.0 04/18/2016 1455   GLUCOSEU NEGATIVE 04/18/2016 1455   HGBUR NEGATIVE 04/18/2016 1455   BILIRUBINUR NEGATIVE 04/18/2016 1455   KETONESUR NEGATIVE 04/18/2016 1455   PROTEINUR NEGATIVE 04/18/2016 1455   NITRITE NEGATIVE 04/18/2016 1455   LEUKOCYTESUR TRACE (A) 04/18/2016 1455   Sepsis Labs Invalid input(s): PROCALCITONIN,  WBC,  LACTICIDVEN Microbiology No results found for this or any previous visit (from the past 240 hour(s)).   Time coordinating discharge: <30 minutes  SIGNED:   Louellen Molder, MD  [facility] 04/19/2016, 3:42 PM Pager   If 7PM-7AM, please contact night-coverage [email or web address] Password [email or web address]

## 2016-04-19 NOTE — Care Management Obs Status (Signed)
MEDICARE OBSERVATION STATUS NOTIFICATION   Patient Details  Name: Lindsay Clark MRN: [id number] Date of Birth: 1941-02-28   Medicare Observation Status Notification Given:  Other (see comment)  Observation status explained to both patient and husband at the bedside. They refuse to acknowledge status, demanded that this CM "fix" it. CM explained that status is determined by regulations enforced by Medicare and changing bed status would be fraudulent without case meeting inpatient criteria. Anticipate DC at <24 hours.    Carles Collet, RN 04/19/2016, 2:25 PM

## 2016-04-19 NOTE — Evaluation (Signed)
Physical Therapy Evaluation/Discharge  Patient Details Name: Lindsay Clark MRN: [id number] DOB: 10-Mar-1941 Today's Date: 04/19/2016   History of Present Illness  75 y.o.femalewith medical history significant of autoimmune encephalitis (with mild right-sided weakness and milddystonia), hypertension, hyperlipidemia, anxiety, colitis, cerebral aneurysm, dCHF, whopresents with fall and syncope.  Clinical Impression  Patient evaluated by Physical Therapy with no further acute PT needs identified. All education has been completed and the patient has no further questions. Pt reports feeling confident with D/C to home and denies any PT needs. PT will be signing off. Thank you for this referral.     Follow Up Recommendations No PT follow up    Equipment Recommendations  None recommended by PT    Recommendations for Other Services       Precautions / Restrictions Precautions Precautions: None Restrictions Weight Bearing Restrictions: No      Mobility  Bed Mobility Overal bed mobility: Independent             General bed mobility comments: in<>out of bed  Transfers Overall transfer level: Independent Equipment used: None             General transfer comment: stable transfer  Ambulation/Gait Ambulation/Gait assistance: Independent Ambulation Distance (Feet): 200 Feet Assistive device: None Gait Pattern/deviations: WFL(Within Functional Limits) Gait velocity: WFL   General Gait Details: mild gait instability but pt reports that this is baseline for her.   Stairs Stairs:  (declined, reports feeling confident)          Wheelchair Mobility    Modified Rankin (Stroke Patients Only)       Balance Overall balance assessment: No apparent balance deficits (not formally assessed)                                           Pertinent Vitals/Pain Pain Assessment: No/denies pain    Home Living Family/patient expects to be discharged to::  Private residence Living Arrangements: Spouse/significant other Available Help at Discharge: Family;Available PRN/intermittently Type of Home: House Home Access: Stairs to enter Entrance Stairs-Rails: Right Entrance Stairs-Number of Steps: 2 Home Layout: One level Home Equipment: None Additional Comments: Pt reports that family is around to help if needed.     Prior Function Level of Independence: Independent               Hand Dominance        Extremity/Trunk Assessment   Upper Extremity Assessment Upper Extremity Assessment: Overall WFL for tasks assessed    Lower Extremity Assessment Lower Extremity Assessment: Overall WFL for tasks assessed       Communication   Communication: No difficulties  Cognition Arousal/Alertness: Awake/alert Behavior During Therapy: WFL for tasks assessed/performed Overall Cognitive Status: Within Functional Limits for tasks assessed                      General Comments      Exercises     Assessment/Plan    PT Assessment Patent does not need any further PT services  PT Problem List            PT Treatment Interventions      PT Goals (Current goals can be found in the Care Plan section)  Acute Rehab PT Goals Patient Stated Goal: get home PT Goal Formulation: With patient Time For Goal Achievement: 04/26/16 Potential to Achieve Goals: Good    Frequency  Barriers to discharge        Co-evaluation               End of Session Equipment Utilized During Treatment:  (pt declined use of gait belt) Activity Tolerance: Patient tolerated treatment well Patient left: in bed;with call bell/phone within reach;with bed alarm set Nurse Communication: Mobility status    Functional Assessment Tool Used: clinical judgment Functional Limitation: Mobility: Walking and moving around Mobility: Walking and Moving Around Current Status [id number]): At least 1 percent but less than 20 percent impaired, limited or  restricted Mobility: Walking and Moving Around Goal Status [phone]): At least 1 percent but less than 20 percent impaired, limited or restricted Mobility: Walking and Moving Around Discharge Status [phone]): At least 1 percent but less than 20 percent impaired, limited or restricted    Time: 0932-0949 PT Time Calculation (min) (ACUTE ONLY): 17 min   Charges:   PT Evaluation $PT Eval Moderate Complexity: 1 Procedure     PT G Codes:   PT G-Codes **NOT FOR INPATIENT CLASS** Functional Assessment Tool Used: clinical judgment Functional Limitation: Mobility: Walking and moving around Mobility: Walking and Moving Around Current Status [id number]): At least 1 percent but less than 20 percent impaired, limited or restricted Mobility: Walking and Moving Around Goal Status [phone]): At least 1 percent but less than 20 percent impaired, limited or restricted Mobility: Walking and Moving Around Discharge Status [phone]): At least 1 percent but less than 20 percent impaired, limited or restricted    Cassell Clement, PT, CSCS Pager [phone] Office [phone]  04/19/2016, 10:06 AM

## 2016-04-19 NOTE — Progress Notes (Signed)
Paged Schorr,NP about patient's potassium level of 2.8. Schorr,NP placed order for PO supplements at 0630 and 1030. 0630 dose given by RN. Will continue to monitor and treat per MD orders.

## 2016-04-19 NOTE — Progress Notes (Signed)
  Echocardiogram 2D Echocardiogram has been performed.  Jennette Dubin 04/19/2016, 11:59 AM

## 2016-05-22 ENCOUNTER — Encounter (HOSPITAL_COMMUNITY): Payer: Self-pay | Admitting: Internal Medicine

## 2016-05-22 NOTE — Assessment & Plan Note (Addendum)
2D echo on 04/13/14 and 04/19/16 all showed EF 55-60% with grade 1 diastolic dysfunction.

## 2016-07-11 DIAGNOSIS — I5189 Other ill-defined heart diseases: Secondary | ICD-10-CM

## 2016-08-30 ENCOUNTER — Other Ambulatory Visit: Payer: Self-pay | Admitting: Orthopaedic Surgery

## 2016-08-30 DIAGNOSIS — M84375A Stress fracture, left foot, initial encounter for fracture: Secondary | ICD-10-CM

## 2016-09-11 ENCOUNTER — Ambulatory Visit
Admission: RE | Admit: 2016-09-11 | Discharge: 2016-09-11 | Disposition: A | Discharge status: Home / Self Care | Payer: Medicare Other | Source: Ambulatory Visit | Attending: Orthopaedic Surgery | Admitting: Orthopaedic Surgery

## 2016-09-11 DIAGNOSIS — M84375A Stress fracture, left foot, initial encounter for fracture: Secondary | ICD-10-CM

## 2016-11-18 ENCOUNTER — Encounter (HOSPITAL_COMMUNITY): Payer: Self-pay | Admitting: *Deleted

## 2016-11-18 ENCOUNTER — Ambulatory Visit (HOSPITAL_COMMUNITY)
Admission: EM | Admit: 2016-11-18 | Discharge: 2016-11-18 | Disposition: A | Discharge status: Home / Self Care | Payer: Medicare Other | Attending: Internal Medicine | Admitting: Internal Medicine

## 2016-11-18 DIAGNOSIS — R002 Palpitations: Secondary | ICD-10-CM | POA: Diagnosis not present

## 2016-11-18 NOTE — ED Triage Notes (Signed)
Reports feeling HR fluctuating between 90s-110s today, and had fire station check BP - was 160/110 PTA.  Occasionally feeling like heart is "beating hard", but denies sensation at this time.  Denies pain.

## 2016-11-20 ENCOUNTER — Telehealth: Payer: Self-pay

## 2016-11-20 NOTE — Telephone Encounter (Signed)
SENT NOTES TO SCHEDULING 

## 2016-11-21 ENCOUNTER — Telehealth: Payer: Self-pay | Admitting: Cardiology

## 2016-11-21 NOTE — Telephone Encounter (Signed)
Received records from [place] for appointment on 01/04/17 with Dr [name].  Records put with Dr Jacalyn Lefevre schedule for 01/04/17. lp

## 2016-12-31 NOTE — Progress Notes (Signed)
Coral Else MD Reason for referral-Palpitations  HPI:  76 year old female for evaluation of palpitations at request of Damaris Hippo, MD. 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 Echocardiogram December 2017 showed normal LV systolic function, grade 1 diastolic dysfunction and mild left atrial enlargement. Patient also carries the diagnosis of chronic diastolic congestive heart failure. Patient had a syncopal episode in December that was attributed to orthostasis or vagal episode. In July she had 2 separate episodes of near syncope. She states her heart was pounding and her heart rate was greater than 100 based on her fitbit. She felt somewhat lightheaded but did not have frank syncope. Each episode lasted approximately 1-1/2-2 hours. No associated dyspnea or chest pain. She went to the fire station and to emergency room but did not have an electrocardiogram during her symptoms. She has some dyspnea on exertion but no orthopnea or PND. Occasional mild pedal edema. Occasional pain in her chest that is not exertional.   Current Outpatient Prescriptions  Medication Sig Dispense Refill  . ALTACE 5 MG capsule Take 5 mg by mouth 2 (two) times daily.     . clobetasol cream (TEMOVATE) 0.99 % Apply 1 application topically 2 (two) times daily as needed (irritation).    . clobetasol ointment (TEMOVATE) 8.33 % Apply 1 application topically 2 (two) times daily as needed (dry skin).     . Clobetasol Propionate 0.05 % shampoo Apply 1 application topically daily as needed (itching).     . hydrochlorothiazide (HYDRODIURIL) 25 MG tablet Take 25 mg by mouth every morning.     . hydrOXYzine (ATARAX/VISTARIL) 10 MG tablet Take 10 mg by mouth 3 (three) times daily as needed.    . Loratadine (CLARITIN PO) Take 1 tablet by mouth daily.    . Multiple Vitamins-Minerals (ONE-A-DAY WOMENS 50 PLUS PO) Take 1 tablet by mouth daily.     . naphazoline-pheniramine (ALLERGY EYE) 0.025-0.3 %  ophthalmic solution Place 1 drop into both eyes daily as needed for irritation.    . Omega-3 Fatty Acids (FISH OIL) 1000 MG CAPS Take 2 capsules by mouth daily.    Marland Kitchen Propylene Glycol (SYSTANE BALANCE) 0.6 % SOLN Apply 1 drop to eye daily as needed (itching).     No current facility-administered medications for this visit.     Allergies  Allergen Reactions  . Other Other (See Comments)    Medication:Methychloroisothlazolinone/methylisothiazilinoneCL +Me-Isothiazolinone Reaction: unknown-patch testing Medication:glyceryl thioglycolate  Reaction:unknown-patch testing  . Quaternium-15 Other (See Comments)    unknown reaction-patching testing     Past Medical History:  Diagnosis Date  . Atherosclerosis    mild cerebral  . 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 Cervical disc disease    with spondylosis  . Cervical spondylosis   . Chronic dermatitis    eczematous  . Colitis   . DDD (degenerative disc disease), lumbar   . Frozen shoulder    left  . Hyperlipidemia   . Hypertension   . Lateral epicondylitis   . Lumbar spondylosis   . Movement disorder   . Multiple lesions on CT of brain and spine    reports lesions from previous CT [facility] at [facility]   . 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 CATARACT EXTRACTION    . COLONOSCOPY    . EYE SURGERY Left   . TUBAL LIGATION  1980    Social History   Social History  . Marital status: Married  Spouse name: [name]  . Number of children: 2  . Years of education: MAx2   Occupational History  . Retired Other   Social History Main Topics  . Smoking status: Never Smoker  . Smokeless tobacco: Never Used  . Alcohol use 0.0 oz/week     Comment: 4 glasses of wine weekly  . Drug use: No  . Sexual activity: No   Other Topics Concern  . Not on file   Social History Narrative   Patient lives at home with her spouse.   Caffeine Use: 2-3 cups daily    Family History  Problem Relation  Age of Onset  . Stroke Mother   . Colon cancer Mother 70  . Heart attack Father 48  . Emphysema Father   . Thyroid disease Son   . Thyroid disease Maternal Grandmother   . Cancer Maternal Grandmother        cervical   . Thrombosis Maternal Grandfather     ROS: no fevers or chills, productive cough, hemoptysis, dysphasia, odynophagia, melena, hematochezia, dysuria, hematuria, rash, seizure activity, orthopnea, PND, pedal edema, claudication. Remaining systems are negative.  Physical Exam:   Blood pressure 100/78, pulse 74, height 5\' 5"  (1.651 m), weight 92.1 kg (203 lb).  General:  Well developed/obese in NAD Skin warm/dry Patient not depressed No peripheral clubbing Back-normal HEENT-normal/normal eyelids Neck supple/normal carotid upstroke bilaterally; no bruits; no JVD; no thyromegaly chest - CTA/ normal expansion CV - RRR/normal S1 and S2; no rubs or gallops;  PMI nondisplaced, 1/6 systolic murmur Abdomen -NT/ND, no HSM, no mass, + bowel sounds, no bruit 2+ femoral pulses, no bruits Ext-trace edema, no chords, 2+ DP Neuro-grossly nonfocal  ECG - sinus rhythm at a rate of 74. No ST changes. personally reviewed  A/P  1 palpitations-etiology unclear. I have recommended alivecors to further assess. Note LV function is normal.  2 chest pain-symptoms are somewhat atypical. I will arrange an exercise treadmill for risk stratification.  3 near syncope-as above LV function is normal. I have recommended alivecors as symptoms infrequent.  4 hypertension-blood pressure is controlled. Continue present medications.  5 chronic diastolic congestive heart failure-continue Lasix as needed. Euvolemic on examination.  Kirk Ruths, MD

## 2017-01-04 ENCOUNTER — Encounter: Payer: Self-pay | Admitting: Cardiology

## 2017-01-04 ENCOUNTER — Ambulatory Visit (INDEPENDENT_AMBULATORY_CARE_PROVIDER_SITE_OTHER): Payer: Medicare Other | Admitting: Cardiology

## 2017-01-04 VITALS — BP 100/78 | HR 74 | Ht 65.0 in | Wt 203.0 lb

## 2017-01-04 DIAGNOSIS — I10 Essential (primary) hypertension: Secondary | ICD-10-CM

## 2017-01-04 DIAGNOSIS — R002 Palpitations: Secondary | ICD-10-CM | POA: Diagnosis not present

## 2017-01-04 DIAGNOSIS — R072 Precordial pain: Secondary | ICD-10-CM

## 2017-01-04 NOTE — Patient Instructions (Signed)
Medication Instructions:   NO CHANGE  Testing/Procedures:  Your physician has requested that you have an exercise tolerance test. For further information please visit [email or web address]. Please also follow instruction sheet, as given.    Follow-Up:  Your physician recommends that you schedule a follow-up appointment in:  [place]

## 2017-01-16 ENCOUNTER — Telehealth (HOSPITAL_COMMUNITY): Payer: Self-pay

## 2017-01-16 NOTE — Telephone Encounter (Signed)
Encounter complete. 

## 2017-01-17 ENCOUNTER — Ambulatory Visit (HOSPITAL_COMMUNITY)
Admission: RE | Admit: 2017-01-17 | Discharge: 2017-01-17 | Disposition: A | Discharge status: Home / Self Care | Payer: Medicare Other | Source: Ambulatory Visit | Attending: Cardiology | Admitting: Cardiology

## 2017-01-17 ENCOUNTER — Encounter: Payer: Self-pay | Admitting: *Deleted

## 2017-01-17 ENCOUNTER — Telehealth: Payer: Self-pay | Admitting: *Deleted

## 2017-01-17 DIAGNOSIS — R002 Palpitations: Secondary | ICD-10-CM | POA: Diagnosis not present

## 2017-01-17 MED ORDER — METOPROLOL SUCCINATE ER 25 MG PO TB24: 25.0000 mg | ORAL_TABLET | Freq: Every day | ORAL | 3 refills | Status: DC

## 2017-01-17 MED ORDER — RAMIPRIL 10 MG PO CAPS: 10.0000 mg | ORAL_CAPSULE | Freq: Every day | ORAL | Status: DC

## 2017-01-17 NOTE — Telephone Encounter (Addendum)
Patient here for GXT, rhythm strip shows atrial tach with rate 120, bp 117/67. Strips reviewed by dr [name] and GXT cancelled. The patient was instructed to decrease ramipril to 10 mg once daily. She will start metoprolol succ er 25 mg once daily at bedtime, she will also start aspirin 81 mg once daily. Order placed for event monitor and the patient will be scheduled to see APP after monitor complete. New script sent to the pharmacy   As above, patient presented for exercise treadmill today. On her strips prior to the study she was noted to have paroxysmal atrial tachycardia with possible short runs of atrial fibrillation. She was having palpitations at the time. I therefore added Toprol 25 mg daily and aspirin 81 mg daily. I have reduced her Altace to 10 mg daily given the addition of Toprol. She will follow her blood pressure and we will adjust regimen as needed. I will arrange an event monitor. If she indeed is having atrial fibrillation she would need anticoagulation long-term. We will see her back after her monitor. We can advance Toprol as needed.  Kirk Ruths, MD

## 2017-01-19 NOTE — ED Provider Notes (Signed)
[name]    CSN: [id number] Arrival date & time: 11/18/16  1638     History   Chief Complaint Chief Complaint  Patient presents with  . Hypertension    HPI Lindsay Clark is a 76 y.o. female.   Pt w/ PMH of HTN presents to clinic concerned about pre-syncopal symptoms and palpitations. This has occurred on multiple occasions. She went to her local fire station to have a BP check during one episode and found her pressure to be high. She has not had LOC or weakness or numbness in her extremities. Denies chest pain, SOB, N/V.       Past Medical History:  Diagnosis Date  . Atherosclerosis    mild cerebral  . 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 Cervical disc disease    with spondylosis  . Cervical spondylosis   . Chronic dermatitis    eczematous  . Colitis   . DDD (degenerative disc disease), lumbar   . Frozen shoulder    left  . Hyperlipidemia   . Hypertension   . Lateral epicondylitis   . Lumbar spondylosis   . Movement disorder   . Multiple lesions on CT of brain and spine    reports lesions from previous CT [facility] at [facility]   . 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 Diastolic dysfunction 86/76/1948  . Syncope 04/18/2016  . Fall 04/18/2016  . Anxiety 04/18/2016  . Hypokalemia 04/18/2016  . Hypertension   . Autoimmune disorder-autoimmune encephalitis   . Coronary artery calcification of native artery 11/04/2014  . Abnormal finding on MRI of brain 10/04/2014    Past Surgical History:  Procedure Laterality Date  . CATARACT EXTRACTION    . COLONOSCOPY    . EYE SURGERY Left   . TUBAL LIGATION  1980    OB History    No data available       Home Medications    Prior to Admission medications   Medication Sig Start Date End Date Taking? Authorizing Provider  clobetasol cream (TEMOVATE) 9.32 % Apply 1 application topically 2 (two) times daily as needed (irritation).   Yes [provider]   clobetasol ointment (TEMOVATE) 6.71 % Apply 1 application topically 2 (two) times daily as needed (dry skin).    Yes [provider]  Clobetasol Propionate 0.05 % shampoo Apply 1 application topically daily as needed (itching).  05/07/14  Yes [provider]  hydrochlorothiazide (HYDRODIURIL) 25 MG tablet Take 25 mg by mouth every morning.    Yes [provider]  hydrOXYzine (ATARAX/VISTARIL) 10 MG tablet Take 10 mg by mouth 3 (three) times daily as needed.   Yes [provider]  Multiple Vitamins-Minerals (ONE-A-DAY WOMENS 50 PLUS PO) Take 1 tablet by mouth daily.    Yes [provider]  naphazoline-pheniramine (ALLERGY EYE) 0.025-0.3 % ophthalmic solution Place 1 drop into both eyes daily as needed for irritation.   Yes [provider]  Omega-3 Fatty Acids (FISH OIL) 1000 MG CAPS Take 2 capsules by mouth daily.   Yes [provider]  Propylene Glycol (SYSTANE BALANCE) 0.6 % SOLN Apply 1 drop to eye daily as needed (itching).   Yes [provider]  hydroxychloroquine (PLAQUENIL) 200 MG tablet Take 200 mg by mouth 2 (two) times daily.    [provider]  Loratadine (CLARITIN PO) Take 1 tablet by mouth daily.    [provider]  metoprolol succinate (TOPROL XL) 25 MG 24 hr tablet Take 1 tablet (25 mg total) by mouth daily. 01/17/17   Lelon Perla, MD  ramipril (ALTACE) 10 MG capsule Take 1 capsule (10 mg total) by mouth daily. 01/17/17   Lelon Perla, MD    Family History Family History  Problem Relation Age of Onset  . Stroke Mother   . Colon cancer Mother 47  . Heart attack Father 50  . Emphysema Father   . Thyroid disease Son   . Thyroid disease Maternal Grandmother   . Cancer Maternal Grandmother        cervical   . Thrombosis Maternal Grandfather     Social History Social History  Substance Use Topics  . Smoking status: Never Smoker  . Smokeless tobacco: Never Used  . Alcohol use  0.0 oz/week     Comment: 4 glasses of wine weekly     Allergies   Other and Quaternium-15   Review of Systems Review of Systems  Constitutional: Negative for chills and fever.  HENT: Negative for sore throat and tinnitus.   Eyes: Negative for redness.  Respiratory: Negative for cough and shortness of breath.   Cardiovascular: Positive for palpitations. Negative for chest pain.  Gastrointestinal: Negative for abdominal pain, diarrhea, nausea and vomiting.  Genitourinary: Negative for dysuria, frequency and urgency.  Musculoskeletal: Negative for myalgias.  Skin: Negative for rash.       No lesions  Neurological: Positive for light-headedness. Negative for weakness.  Hematological: Does not bruise/bleed easily.  Psychiatric/Behavioral: Negative for suicidal ideas.     Physical Exam Triage Vital Signs ED Triage Vitals  Enc Vitals Group     BP 11/18/16 1654 130/62     Pulse Rate 11/18/16 1654 86     Resp 11/18/16 1654 16     Temp 11/18/16 1654 98.3 F (36.8 C)     Temp Source 11/18/16 1654 Oral     SpO2 11/18/16 1654 97 %     Weight --      Height --      Head Circumference --      Peak Flow --      Pain Score 11/18/16 1655 0     Pain Loc --      Pain Edu? --      Excl. in [place]? --    No data found.   Updated Vital Signs BP 130/62   Pulse 86 Comment: regular rate  Temp 98.3 F (36.8 C) (Oral)   Resp 16   SpO2 97%   Visual Acuity Right Eye Distance:   Left Eye Distance:   Bilateral Distance:    Right Eye Near:   Left Eye Near:    Bilateral Near:     Physical Exam  Constitutional: She is oriented to person, place, and time. She appears well-developed and well-nourished. No distress.  HENT:  Head: Normocephalic and atraumatic.  Mouth/Throat: Oropharynx is clear and moist.  Eyes: Pupils are equal, round, and reactive to light. Conjunctivae and EOM are normal. No scleral icterus.  Neck: Normal range of motion. Neck supple. No JVD present. No tracheal  deviation present. No thyromegaly present.  Cardiovascular: Normal rate, regular rhythm and normal heart sounds.  Exam reveals no gallop and no friction rub.   No murmur heard. Pulmonary/Chest: Effort normal and breath sounds normal.  Abdominal: Soft. Bowel sounds are normal. She exhibits no distension. There is no tenderness.  Musculoskeletal: Normal range of motion. She exhibits no edema.  Lymphadenopathy:    She has no cervical adenopathy.  Neurological: She is alert and oriented to person, place, and time. No cranial nerve deficit.  Skin: Skin is warm and dry.  Psychiatric: She has a normal mood and affect. Her behavior is normal. Judgment and thought content normal.  Nursing note and vitals reviewed.    UC Treatments / Results  Labs (all labs ordered are listed, but only abnormal results are displayed) Labs Reviewed - No data to display  EKG  EKG Interpretation None       Radiology No results found.  Procedures Procedures (including critical care time)  Medications Ordered in UC Medications - No data to display   Initial Impression / Assessment and Plan / UC Course  I have reviewed the triage vital signs and the nursing notes.  Pertinent labs & imaging results that were available during my care of the patient were reviewed by me and considered in my medical decision making (see chart for details).     May benefit for Holter or event monitoring. Recommend cardiology evaluation.   Final Clinical Impressions(s) / UC Diagnoses   Final diagnoses:  Palpitations    New Prescriptions Discharge Medication List as of 11/18/2016  6:26 PM       Controlled Substance Prescriptions  Controlled Substance Registry consulted? Not Applicable   Harrie Foreman, MD 01/19/17 1416

## 2017-01-25 ENCOUNTER — Ambulatory Visit (INDEPENDENT_AMBULATORY_CARE_PROVIDER_SITE_OTHER): Payer: Medicare Other

## 2017-01-25 ENCOUNTER — Telehealth: Payer: Self-pay | Admitting: Cardiology

## 2017-01-25 DIAGNOSIS — R002 Palpitations: Secondary | ICD-10-CM | POA: Diagnosis not present

## 2017-01-25 NOTE — Telephone Encounter (Signed)
Returned call to patient of Dr. [name] who was seen as a new patient on 9/14  Patient reports her heart is "pounding". She has had incidences of this but it has not been sustained. She was scheduled for GXT a week ago and this was cancelled d/t atrial tachy (see phone note 9/27). Her rate is controlled in the 70s, however she reports she has had episodes prior to seeing MD on 9/14 where her HR would go up to 100+bpm at rest. She states she cannot bend over. She reports she is burping a lot and passing a lot of gas, which is not usual for her. She is concerned since her father died of an MI and his only symptom was indigestion.   Only symptoms are fainting feeling, indigestions, burping, gas. Denies chest pain & SOB  She is compliant with her medications and med changes per MD  She has a monitor appt scheduled on 10/11 >> moved to today @ 10/5 ** provided patient w/address for [facility] office  Will route to MD for recommendations

## 2017-01-25 NOTE — Telephone Encounter (Signed)
New message   Patient feels like chest was pounding yesterday, could not bend over. Patient feels medication is causing her to feel worse. Please Call   How long have you had palpitations/irregular HR/ Afib? Are you having the symptoms now? Patient unsure. Yes 1) Are you currently experiencing lightheadedness, SOB or CP? No  2) Do you have a history of afib (atrial fibrillation) or irregular heart rhythm? Patient unsure  3) Have you checked your BP or HR? (document readings if available): HR 70  4) Are you experiencing any other symptoms?  Indigestion/ Burping and gas  1. Name of Medication: metoprolol succinate (TOPROL XL) 25 MG 24 hr tablet 2. How are you currently taking this medication (dosage and times per day)Take 1 tablet (25 mg total) by mouth daily?  3. Are you having a reaction (difficulty breathing--STAT)? No 4. What is your medication issue? Patient thinks medication is causing her to feel worse.

## 2017-01-25 NOTE — Telephone Encounter (Signed)
LMTCB

## 2017-01-25 NOTE — Telephone Encounter (Signed)
Spoke with pt, Follow up scheduled  

## 2017-01-25 NOTE — Telephone Encounter (Signed)
Arrange paov Kirk Ruths

## 2017-01-28 ENCOUNTER — Encounter: Payer: Self-pay | Admitting: *Deleted

## 2017-01-28 ENCOUNTER — Telehealth: Payer: Self-pay | Admitting: Internal Medicine

## 2017-01-28 MED ORDER — APIXABAN 5 MG PO TABS: 5.0000 mg | ORAL_TABLET | Freq: Two times a day (BID) | ORAL | 5 refills | Status: DC

## 2017-01-28 NOTE — Telephone Encounter (Signed)
preventice service called. Patient had a self limiting episode of atrial fib/flutter at 100 bpm. Unable to contact the patient. Patient will need appointment with afib clinic and need to be started on anticoagulation per her risk.

## 2017-01-28 NOTE — Telephone Encounter (Signed)
Will begin apixaban 5 mg BID; fu PAOV scheduled; CBA and bmet one week Kirk Ruths

## 2017-01-28 NOTE — Addendum Note (Signed)
Addended by: Alvina Filbert B on: 01/28/2017 01:14 PM   Modules accepted: Orders

## 2017-01-28 NOTE — Telephone Encounter (Signed)
This encounter was created in error - please disregard.

## 2017-01-29 NOTE — Telephone Encounter (Signed)
Spoke with patient yesterday and Rx sent to pharmacy

## 2017-01-29 NOTE — Telephone Encounter (Signed)
Also advised patient to stop ASA since no known CAD Keep follow up next week

## 2017-01-30 ENCOUNTER — Telehealth: Payer: Self-pay | Admitting: Cardiology

## 2017-01-30 ENCOUNTER — Telehealth: Payer: Self-pay | Admitting: Physical Medicine and Rehabilitation

## 2017-01-30 NOTE — Telephone Encounter (Signed)
Returned call to patient no answer.LMTC. 

## 2017-01-30 NOTE — Telephone Encounter (Signed)
New message    Pt is calling asking for a call back. She said she is having the symptoms she spoke w/ nurse about. Please call.

## 2017-01-30 NOTE — Telephone Encounter (Signed)
Error

## 2017-02-01 NOTE — Telephone Encounter (Signed)
Attempted to reach patient - line busy/phone disconnected dial tone

## 2017-02-05 NOTE — Telephone Encounter (Signed)
Spoke with pt she states that this call was regarding a monitor, she states that she has appt and will discuss at that visit

## 2017-02-06 ENCOUNTER — Ambulatory Visit (INDEPENDENT_AMBULATORY_CARE_PROVIDER_SITE_OTHER): Payer: Medicare Other | Admitting: Physician Assistant

## 2017-02-06 ENCOUNTER — Encounter: Payer: Self-pay | Admitting: Physician Assistant

## 2017-02-06 ENCOUNTER — Telehealth (HOSPITAL_COMMUNITY): Payer: Self-pay

## 2017-02-06 VITALS — BP 115/74 | HR 80 | Ht 65.0 in | Wt 202.2 lb

## 2017-02-06 DIAGNOSIS — I4892 Unspecified atrial flutter: Secondary | ICD-10-CM | POA: Diagnosis not present

## 2017-02-06 DIAGNOSIS — I5032 Chronic diastolic (congestive) heart failure: Secondary | ICD-10-CM | POA: Diagnosis not present

## 2017-02-06 DIAGNOSIS — I10 Essential (primary) hypertension: Secondary | ICD-10-CM | POA: Diagnosis not present

## 2017-02-06 DIAGNOSIS — R079 Chest pain, unspecified: Secondary | ICD-10-CM | POA: Diagnosis not present

## 2017-02-06 DIAGNOSIS — E785 Hyperlipidemia, unspecified: Secondary | ICD-10-CM | POA: Diagnosis not present

## 2017-02-06 MED ORDER — RAMIPRIL 5 MG PO CAPS: 5.0000 mg | ORAL_CAPSULE | Freq: Every day | ORAL | 3 refills | Status: DC

## 2017-02-06 MED ORDER — METOPROLOL SUCCINATE ER 25 MG PO TB24: 25.0000 mg | ORAL_TABLET | Freq: Two times a day (BID) | ORAL | 3 refills | Status: DC

## 2017-02-06 NOTE — Progress Notes (Signed)
Cardiology Office Note    Date:  02/07/2017   ID:  [name], DOB [birth year], MRN [id number]  PCP:  Lindsay Stains, MD  Cardiologist:  Dr. Stanford Clark   Chief Complaint  Patient presents with  . Follow-up    seen for Dr. Stanford Clark.    History of Present Illness:  Lindsay Clark is a 76 y.o. female with PMH of mild carotid artery disease, HTN, HLD, chronic diastolic HF and newly diagnosed atrial flutter. She had a carotid doppler in December 2015 showed 1-39% bilateral stenosis. Echocardiogram in December 2017 showed a normal LV function, grade 1 DD, mild LAE. She had a syncopal episode in December that was attributed to orthostasis or vagal episode. She had 2 other near syncope in July. She reported palpitation with heart rate of greater than 100 based on her fitbit. Each episode lasted 1-1.5 hours. She was last seen by Dr. Stanford Clark on 01/04/2017, outpatient ETT was arranged. Unfortunate she could not complete the ETT as she had paroxysmal atrial tachycardia versus atrial fibrillation on the treadmill. Metoprolol was added to her medical regimen. Event monitor was placed, after hour answering service was contacted on 01/28/2017 due to a episode of self-limiting atrial fibrillation/atrial flutter. She was started on eliquis, aspirin discontinued.  She presents today to cardiology service along with her husband. She had significant amount of palpitation yesterday. On report, she has either coarse afib or atrial flutter yesterday. I will increase her Toprol-XL to 25 mg twice a day. I will also decrease her ramipril to 5 mg daily. Although she initially said she is not taking eliquis, however later she says she has taken eliquis. We went through benefit and risk of the eliquis compared to traditional Coumadin. She does not have any obvious contraindication, she denies any significant fall episode or history of major anemia. According to the patient, she is very concerned about having a heart  attack, her father had first MI in his 20s. Her cardiac risk factor also include hypertension and hyperlipidemia. Although her recent indigestion symptom is quite atypical, however given her risk factors, we plan for outpatient Lexiscan Myoview.  Patient was admitted very anxious today and had multiple questions. I spent more than 40 minutes with the patient with greater than 35 minutes answering her questions, reviewing monitor strips and doing patient indication.   Past Medical History:  Diagnosis Date  . Atherosclerosis    mild cerebral  . 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 Cervical disc disease    with spondylosis  . Cervical spondylosis   . Chronic dermatitis    eczematous  . Colitis   . DDD (degenerative disc disease), lumbar   . Frozen shoulder    left  . Hyperlipidemia   . Hypertension   . Lateral epicondylitis   . Lumbar spondylosis   . Movement disorder   . Multiple lesions on CT of brain and spine    reports lesions from [facility] at [facility]   . 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 CATARACT EXTRACTION    . COLONOSCOPY    . EYE SURGERY Left   . TUBAL LIGATION  1980    Current Medications: Outpatient Medications Prior to Visit  Medication Sig Dispense Refill  . clobetasol cream (TEMOVATE) 9.38 % Apply 1 application topically 2 (two) times daily as needed (irritation).    . Clobetasol Propionate 0.05 % shampoo Apply 1 application topically daily  as needed (itching).     . hydrochlorothiazide (HYDRODIURIL) 25 MG tablet Take 25 mg by mouth every morning.     . hydroxychloroquine (PLAQUENIL) 200 MG tablet Take 200 mg by mouth 2 (two) times daily.    . hydrOXYzine (ATARAX/VISTARIL) 10 MG tablet Take 10 mg by mouth 3 (three) times daily as needed.    . Multiple Vitamins-Minerals (ONE-A-DAY WOMENS 50 PLUS PO) Take 1 tablet by mouth daily.     . Omega-3 Fatty Acids (FISH OIL) 1000 MG CAPS Take 2 capsules by mouth daily.     Marland Kitchen Propylene Glycol (SYSTANE BALANCE) 0.6 % SOLN Apply 1 drop to eye daily as needed (itching).    . metoprolol succinate (TOPROL XL) 25 MG 24 hr tablet Take 1 tablet (25 mg total) by mouth daily. 90 tablet 3  . ramipril (ALTACE) 10 MG capsule Take 1 capsule (10 mg total) by mouth daily.    Marland Kitchen apixaban (ELIQUIS) 5 MG TABS tablet Take 1 tablet (5 mg total) by mouth 2 (two) times daily. 60 tablet 5  . clobetasol ointment (TEMOVATE) 1.06 % Apply 1 application topically 2 (two) times daily as needed (dry skin).     . Loratadine (CLARITIN PO) Take 1 tablet by mouth daily.    . naphazoline-pheniramine (ALLERGY EYE) 0.025-0.3 % ophthalmic solution Place 1 drop into both eyes daily as needed for irritation.     No facility-administered medications prior to visit.      Allergies:   Amlodipine; Other; and Quaternium-15   Social History   Social History  . Marital status: Married    Spouse name: Lindsay Clark  . Number of children: 2  . Years of education: MAx2   Occupational History  . Retired Other   Social History Main Topics  . Smoking status: Never Smoker  . Smokeless tobacco: Never Used  . Alcohol use 0.0 oz/week     Comment: 4 glasses of wine weekly  . Drug use: No  . Sexual activity: No   Other Topics Concern  . None   Social History Narrative   Patient lives at home with her spouse.   Caffeine Use: 2-3 cups daily     Family History:  The patient's family history includes Cancer in her maternal grandmother; Colon cancer (age of onset: 49) in her mother; Emphysema in her father; Heart attack (age of onset: 70) in her father; Stroke in her mother; Thrombosis in her maternal grandfather; Thyroid disease in her maternal grandmother and son.   ROS:   Please see the history of present illness.    ROS All other systems reviewed and are negative.   PHYSICAL EXAM:   VS:  BP 115/74   Pulse 80   Ht 5\' 5"  (1.651 m)   Wt 202 lb 3.2 oz (91.7 kg)   BMI 33.65 kg/m    GEN: Well nourished,  well developed, in no acute distress  HEENT: normal  Neck: no JVD, carotid bruits, or masses Cardiac: RRR; no murmurs, rubs, or gallops,no edema  Respiratory:  clear to auscultation bilaterally, normal work of breathing GI: soft, nontender, nondistended, + BS MS: no deformity or atrophy  Skin: warm and dry, no rash Neuro:  Alert and Oriented x 3, Strength and sensation are intact Psych: euthymic mood, full affect  Wt Readings from Last 3 Encounters:  02/06/17 202 lb 3.2 oz (91.7 kg)  01/04/17 203 lb (92.1 kg)  04/18/16 197 lb 11.2 oz (89.7 kg)      Studies/Labs Reviewed:  EKG:  EKG is not ordered today.    Recent Labs: 04/18/2016: ALT 27; B Natriuretic Peptide 13.2 04/19/2016: BUN 10; Creatinine, Ser 0.55; Hemoglobin 12.5; Platelets 180; Potassium 2.8; Sodium 138   Lipid Panel No results found for: CHOL, TRIG, HDL, CHOLHDL, VLDL, LDLCALC, LDLDIRECT  Additional studies/ records that were reviewed today include:   Echo 04/19/2016 LV EF: 55% -   60%  Study Conclusions  - Left ventricle: The cavity size was normal. Wall thickness was   normal. Systolic function was normal. The estimated ejection   fraction was in the range of 55% to 60%. Wall motion was normal;   there were no regional wall motion abnormalities. Doppler   parameters are consistent with abnormal left ventricular   relaxation (grade 1 diastolic dysfunction). - Aortic valve: There was no stenosis. - Mitral valve: There was trivial regurgitation. - Left atrium: The atrium was mildly dilated. - Right ventricle: The cavity size was normal. Systolic function   was normal. - Pulmonary arteries: No complete TR doppler jet so unable to   estimate PA systolic pressure. - Inferior vena cava: The vessel was normal in size. The   respirophasic diameter changes were in the normal range (>= 50%),   consistent with normal central venous pressure.  Impressions:  - Normal LV size and systolic function, EF 63-84%.  Normal RV size   and systolic function. No significant valvular abnormalities.     ASSESSMENT:    1. Chest pain, unspecified type   2. Essential hypertension   3. Hyperlipidemia, unspecified hyperlipidemia type   4. Chronic diastolic heart failure ([place])   5. Atrial flutter, unspecified type ([place])      PLAN:  In order of problems listed above:  1. Chest discomfort: Described as indigestion feeling, continued to feel poorly. Her cardiovascular risk factors include age, HTN, HLD, early family history of CAD. Will obtain outpatient Myoview to further assess.  2. New atrial fibrillation/atrial flutter: Difficult to see on heart monitor, she has yet to complete her heart monitor as this time. She has been started on eliquis and also metoprolol, I will increase her metoprolol to 25 mg twice a day to offer better rate control.  3. Chronic diastolic heart failure: Euvolemic on physical exam.  4. Hypertension: Blood pressure stable, decrease ramipril 25 mg daily, increase Toprol-XL to 25 mg twice a day to offer better rate control.  5. Hyperlipidemia: Currently not on statin, will to defer to primary care provider to obtain annual lab work.    Medication Adjustments/Labs and Tests Ordered: Current medicines are reviewed at length with the patient today.  Concerns regarding medicines are outlined above.  Medication changes, Labs and Tests ordered today are listed in the Patient Instructions below. Patient Instructions  Medication Instructions:  INCREASE- Metoprolol Succinate 25 mg twice a day DECREASE- Ramipril 5 mg daily  If you need a refill on your cardiac medications before your next appointment, please call your pharmacy.  Labwork: None Ordered   Testing/Procedures: Your physician has requested that you have a lexiscan myoview. For further information please visit [email or web address]. Please follow instruction sheet, as given.   Follow-Up: Your physician wants you to  follow-up in: Keep schedule appointment with with Dr Lindsay Clark on December 14th @ 8:20 am.   Thank you for choosing [facility] at [facility], [place], [place]  02/07/2017 12:11 PM    [address] [place], [place]  [address] Phone: [phone]; Fax: [phone]

## 2017-02-06 NOTE — Telephone Encounter (Signed)
Encounter complete. 

## 2017-02-06 NOTE — Patient Instructions (Signed)
Medication Instructions:  INCREASE- Metoprolol Succinate 25 mg twice a day DECREASE- Ramipril 5 mg daily  If you need a refill on your cardiac medications before your next appointment, please call your pharmacy.  Labwork: None Ordered   Testing/Procedures: Your physician has requested that you have a lexiscan myoview. For further information please visit [email or web address]. Please follow instruction sheet, as given.   Follow-Up: Your physician wants you to follow-up in: Keep schedule appointment with with Dr [name] on December 14th @ 8:20 am.   Thank you for choosing [facility] at [facility]!!

## 2017-02-07 ENCOUNTER — Ambulatory Visit: Payer: Medicare Other | Admitting: Physician Assistant

## 2017-02-07 ENCOUNTER — Encounter: Payer: Self-pay | Admitting: Physician Assistant

## 2017-02-07 ENCOUNTER — Ambulatory Visit: Payer: Self-pay | Admitting: Cardiology

## 2017-02-07 NOTE — Progress Notes (Signed)
HPI: FU palpitations. 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 Echocardiogram December 2017 showed normal LV systolic function, grade 1 diastolic dysfunction and mild left atrial enlargement. Patient also carries the diagnosis of chronic diastolic congestive heart failure. Patient seen in September with complaints of palpitations and near syncope. Patient was scheduled for an exercise treadmill and when she came she was noted to have runs of paroxysmal atrial tachycardia/fibrillation. Procedure was canceled. Monitor was ordered and showed paroxysmal atrial fibrillation/flutter by report; final strips not available. Nuclear study October 2018 showed ejection fraction 67%, apical thinning but no ischemia. Placed on apixaban. Since last seen, patient occasionally has dyspnea which is chronic. No orthopnea, PND, pedal edema, chest pain or syncope. She has occasional brief palpitations but much improved.  Current Outpatient Prescriptions  Medication Sig Dispense Refill  . apixaban (ELIQUIS) 5 MG TABS tablet Take 5 mg by mouth 2 (two) times daily.    . clobetasol cream (TEMOVATE) 4.40 % Apply 1 application topically 2 (two) times daily as needed (irritation).    . Clobetasol Propionate 0.05 % shampoo Apply 1 application topically daily as needed (itching).     . hydrochlorothiazide (HYDRODIURIL) 25 MG tablet Take 25 mg by mouth every morning.     . hydroxychloroquine (PLAQUENIL) 200 MG tablet Take 200 mg by mouth 2 (two) times daily.    . hydrOXYzine (ATARAX/VISTARIL) 10 MG tablet Take 10 mg by mouth 3 (three) times daily as needed.    . metoprolol succinate (TOPROL XL) 25 MG 24 hr tablet Take 1 tablet (25 mg total) by mouth 2 (two) times daily. 180 tablet 3  . Multiple Vitamins-Minerals (ONE-A-DAY WOMENS 50 PLUS PO) Take 1 tablet by mouth daily.     . Omega-3 Fatty Acids (FISH OIL) 1000 MG CAPS Take 2 capsules by mouth daily.    Marland Kitchen Propylene Glycol (SYSTANE BALANCE) 0.6 % SOLN  Apply 1 drop to eye daily as needed (itching).    . ramipril (ALTACE) 5 MG capsule Take 1 capsule (5 mg total) by mouth daily. 90 capsule 3   No current facility-administered medications for this visit.      Past Medical History:  Diagnosis Date  . Atherosclerosis    mild cerebral  . 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 Cervical disc disease    with spondylosis  . Cervical spondylosis   . Chronic dermatitis    eczematous  . Colitis   . DDD (degenerative disc disease), lumbar   . Frozen shoulder    left  . Hyperlipidemia   . Hypertension   . Lateral epicondylitis   . Lumbar spondylosis   . Movement disorder   . Multiple lesions on CT of brain and spine    reports lesions from previous CT scan at [facility]   . 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 CATARACT EXTRACTION    . COLONOSCOPY    . EYE SURGERY Left   . TUBAL LIGATION  1980    Social History   Social History  . Marital status: Married    Spouse name: [name]  . Number of children: 2  . Years of education: MAx2   Occupational History  . Retired Other   Social History Main Topics  . Smoking status: Never Smoker  . Smokeless tobacco: Never Used  . Alcohol use 0.0 oz/week     Comment: 4 glasses of wine weekly  . Drug use:  No  . Sexual activity: No   Other Topics Concern  . Not on file   Social History Narrative   Patient lives at home with her spouse.   Caffeine Use: 2-3 cups daily    Family History  Problem Relation Age of Onset  . Stroke Mother   . Colon cancer Mother 79  . Heart attack Father 62  . Emphysema Father   . Thyroid disease Son   . Thyroid disease Maternal Grandmother   . Cancer Maternal Grandmother        cervical   . Thrombosis Maternal Grandfather     ROS: no fevers or chills, productive cough, hemoptysis, dysphasia, odynophagia, melena, hematochezia, dysuria, hematuria, rash, seizure activity, orthopnea, PND, pedal edema, claudication.  Remaining systems are negative.  Physical Exam: Well-developed well-nourished in no acute distress.  Skin is warm and dry.  HEENT is normal.  Neck is supple.  Chest is clear to auscultation with normal expansion.  Cardiovascular exam is regular rate and rhythm.  Abdominal exam nontender or distended. No masses palpated. Extremities show no edema. neuro grossly intact  ECG- Normal sinus rhythm at a rate of 65. No ST changes. personally reviewed  A/P  1 Paroxysmal atrial fibrillation-recent TSH by primary care 3.0. Repeat echocardiogram. Patient's symptoms of palpitations have improved. Continue Toprol. She is essentially asymptomatic other than brief palpitations. I would favor rate control and anticoagulation long-term. We will consider antiarrhythmic if she develops symptoms. Continue apixaban.   2 hypertension-blood pressure is controlled. Continue present medications. Blood pressure has been lower on higher dose Toprol. She may not require. Will discontinue and follow.  3 chest pain-symptoms felt to be atypical and have resolved. Recent nuclear study negative.  4 chronic diastolic congestive heart failure-euvolemic on examination. Continue Lasix as needed.  Kirk Ruths, MD

## 2017-02-08 ENCOUNTER — Ambulatory Visit (HOSPITAL_COMMUNITY)
Admission: RE | Admit: 2017-02-08 | Discharge: 2017-02-08 | Disposition: A | Discharge status: Home / Self Care | Payer: Medicare Other | Source: Ambulatory Visit | Attending: Physician Assistant | Admitting: Physician Assistant

## 2017-02-08 DIAGNOSIS — R079 Chest pain, unspecified: Secondary | ICD-10-CM | POA: Insufficient documentation

## 2017-02-08 DIAGNOSIS — R002 Palpitations: Secondary | ICD-10-CM | POA: Diagnosis not present

## 2017-02-08 DIAGNOSIS — I2510 Atherosclerotic heart disease of native coronary artery without angina pectoris: Secondary | ICD-10-CM | POA: Insufficient documentation

## 2017-02-08 DIAGNOSIS — I509 Heart failure, unspecified: Secondary | ICD-10-CM | POA: Diagnosis not present

## 2017-02-08 DIAGNOSIS — Z8249 Family history of ischemic heart disease and other diseases of the circulatory system: Secondary | ICD-10-CM | POA: Diagnosis not present

## 2017-02-08 DIAGNOSIS — Z6833 Body mass index (BMI) 33.0-33.9, adult: Secondary | ICD-10-CM | POA: Insufficient documentation

## 2017-02-08 DIAGNOSIS — E669 Obesity, unspecified: Secondary | ICD-10-CM | POA: Insufficient documentation

## 2017-02-08 DIAGNOSIS — I110 Hypertensive heart disease with heart failure: Secondary | ICD-10-CM | POA: Insufficient documentation

## 2017-02-08 LAB — MYOCARDIAL PERFUSION IMAGING
CHL CUP NUCLEAR SDS: 6
CHL CUP NUCLEAR SSS: 6
CHL CUP RESTING HR STRESS: 59 {beats}/min
LV sys vol: 29 mL
LVDIAVOL: 89 mL (ref 46–106)
NUC STRESS TID: 1.26
Peak HR: 80 {beats}/min
SRS: 0

## 2017-02-08 MED ORDER — TECHNETIUM TC 99M TETROFOSMIN IV KIT
10.5000 | PACK | Freq: Once | INTRAVENOUS | Status: AC | PRN
  Administered 2017-02-08: 10.5 via INTRAVENOUS
  Filled 2017-02-08: qty 11

## 2017-02-08 MED ORDER — REGADENOSON 0.4 MG/5ML IV SOLN
0.4000 mg | Freq: Once | INTRAVENOUS | Status: AC
  Administered 2017-02-08: 0.4 mg via INTRAVENOUS

## 2017-02-08 MED ORDER — TECHNETIUM TC 99M TETROFOSMIN IV KIT
31.4000 | PACK | Freq: Once | INTRAVENOUS | Status: AC | PRN
  Administered 2017-02-08: 31.4 via INTRAVENOUS
  Filled 2017-02-08: qty 32

## 2017-02-08 NOTE — Progress Notes (Signed)
Normal pumping function of heart, no obvious sign of significant blockage seen

## 2017-02-12 ENCOUNTER — Ambulatory Visit (INDEPENDENT_AMBULATORY_CARE_PROVIDER_SITE_OTHER): Payer: Medicare Other | Admitting: Cardiology

## 2017-02-12 ENCOUNTER — Encounter: Payer: Self-pay | Admitting: Cardiology

## 2017-02-12 VITALS — BP 106/64 | HR 65 | Ht 65.0 in | Wt 202.0 lb

## 2017-02-12 DIAGNOSIS — I480 Paroxysmal atrial fibrillation: Secondary | ICD-10-CM

## 2017-02-12 DIAGNOSIS — I10 Essential (primary) hypertension: Secondary | ICD-10-CM | POA: Diagnosis not present

## 2017-02-12 DIAGNOSIS — R072 Precordial pain: Secondary | ICD-10-CM

## 2017-02-12 NOTE — Patient Instructions (Signed)
Medication Instructions:   NO CHANGE  Testing/Procedures:  Your physician has requested that you have an echocardiogram. Echocardiography is a painless test that uses sound waves to create images of your heart. It provides your doctor with information about the size and shape of your heart and how well your heart's chambers and valves are working. This procedure takes approximately one hour. There are no restrictions for this procedure.    Follow-Up:  Your physician recommends that you schedule a follow-up appointment in: AS SCHEDULED  If you need a refill on your cardiac medications before your next appointment, please call your pharmacy.

## 2017-02-12 NOTE — Addendum Note (Signed)
Addended by: Cristopher Estimable on: 02/12/2017 01:37 PM   Modules accepted: Orders

## 2017-02-18 ENCOUNTER — Telehealth: Payer: Self-pay | Admitting: Cardiology

## 2017-02-18 NOTE — Telephone Encounter (Signed)
Returned call to pt she states that she has had her monitor on and she has noted via her fitbit that her HR has been irregular and high she states that it has been as high as 120's according to her fitbit and she has not gotten a call from the monitor company. She states that only called her once when her monitor was not transmitting. She states that she is going to take it off tomorrow as discussed with Dr [name]. She was wonder if Dr has reviewed her report? Please advise.

## 2017-02-18 NOTE — Telephone Encounter (Signed)
Will review strips when they are sent to me ]Lindsay Clark

## 2017-02-18 NOTE — Telephone Encounter (Signed)
New Message     Please call patient is wearing a holtor monitor and she wants you to call her    [phone] cell if no answer

## 2017-02-19 ENCOUNTER — Other Ambulatory Visit: Payer: Self-pay

## 2017-02-19 ENCOUNTER — Other Ambulatory Visit (HOSPITAL_COMMUNITY): Payer: Self-pay

## 2017-02-19 ENCOUNTER — Ambulatory Visit (HOSPITAL_COMMUNITY): Payer: Medicare Other | Attending: Cardiology

## 2017-02-19 DIAGNOSIS — I480 Paroxysmal atrial fibrillation: Secondary | ICD-10-CM | POA: Diagnosis not present

## 2017-02-19 DIAGNOSIS — I5030 Unspecified diastolic (congestive) heart failure: Secondary | ICD-10-CM | POA: Insufficient documentation

## 2017-02-21 ENCOUNTER — Telehealth: Payer: Self-pay | Admitting: Cardiology

## 2017-02-21 NOTE — Telephone Encounter (Signed)
New Message     STAT if HR is under 50 or over 120 (normal HR is 60-100 beats per minute)  1) What is your heart rate?112-120 resting  Last night for about 2 hours   2) Do you have a log of your heart rate readings (document readings)?  no  3) Do you have any other symptoms? Feeling pressure in head when this happens , has been waiting since Monday for someone to call her back

## 2017-02-21 NOTE — Telephone Encounter (Signed)
Spoke to patient. Patient is concerned she had an episode last evening lasting for 2 hours. She states she had taken off holter monitor the day before but call   had called the company an reapplied  For about an hour during the episodes.  She states her fit as well showed  Heart rate from 69 -112- 120 during that time . She states no chest discomfort , pressure in her head . Not able to check blood pressure.   Patient states she is taking toprol xl 25 mg twice a day.  RN informed patient during closing hours ,she always can call the doctor on call for instructions.  Patient aware will defer to Dr [name] and contact her back.

## 2017-02-21 NOTE — Telephone Encounter (Signed)
Please contact Afib clinic for additional information

## 2017-02-21 NOTE — Telephone Encounter (Signed)
Would arrange appt in atrial fibrillation clinic [facility], [place]

## 2017-02-21 NOTE — Telephone Encounter (Signed)
New Message       Pt wants to know what is going to be done to her at the AFIB clinic

## 2017-02-21 NOTE — Telephone Encounter (Signed)
Spoke to patient's husband- left information concerning up coming appointment with afib clinic on Monday 11/5 at 1:30 pm . Parking code given #[id number], and phone number.   any question may call back.

## 2017-02-22 NOTE — Telephone Encounter (Signed)
Patient notified of info per MD and pharmacy staff. She voiced understanding. She asked what would be done at visit - informed her an EKG may be performed at discretion of provider. She states she has recently had a lot of testing done. Explained that NP has access to all of this in our charting system and can review.

## 2017-02-22 NOTE — Telephone Encounter (Signed)
Lindsay Clark is returning a call . Please call

## 2017-02-22 NOTE — Telephone Encounter (Signed)
Options for antiarrhythmics for afib Lindsay Clark

## 2017-02-25 ENCOUNTER — Ambulatory Visit (HOSPITAL_COMMUNITY)
Admission: RE | Admit: 2017-02-25 | Discharge: 2017-02-25 | Disposition: A | Discharge status: Home / Self Care | Payer: Medicare Other | Source: Ambulatory Visit | Attending: Nurse Practitioner | Admitting: Nurse Practitioner

## 2017-02-25 ENCOUNTER — Ambulatory Visit (HOSPITAL_COMMUNITY): Payer: Self-pay | Admitting: Nurse Practitioner

## 2017-02-25 ENCOUNTER — Encounter (HOSPITAL_COMMUNITY): Payer: Self-pay | Admitting: Nurse Practitioner

## 2017-02-25 VITALS — BP 118/64 | HR 73 | Ht 65.0 in | Wt 202.0 lb

## 2017-02-25 DIAGNOSIS — I5032 Chronic diastolic (congestive) heart failure: Secondary | ICD-10-CM | POA: Diagnosis not present

## 2017-02-25 DIAGNOSIS — Z79899 Other long term (current) drug therapy: Secondary | ICD-10-CM | POA: Diagnosis not present

## 2017-02-25 DIAGNOSIS — I4891 Unspecified atrial fibrillation: Secondary | ICD-10-CM | POA: Diagnosis present

## 2017-02-25 DIAGNOSIS — I480 Paroxysmal atrial fibrillation: Secondary | ICD-10-CM | POA: Diagnosis not present

## 2017-02-25 DIAGNOSIS — Z7901 Long term (current) use of anticoagulants: Secondary | ICD-10-CM | POA: Insufficient documentation

## 2017-02-25 DIAGNOSIS — E785 Hyperlipidemia, unspecified: Secondary | ICD-10-CM | POA: Insufficient documentation

## 2017-02-25 DIAGNOSIS — I110 Hypertensive heart disease with heart failure: Secondary | ICD-10-CM | POA: Insufficient documentation

## 2017-02-26 NOTE — Progress Notes (Signed)
Primary Care Physician: Lindsay Stains, MD Referring Physician: Dr. Jennette Clark Clark is a 76 y.o. female with a h/o newly diagnosed afib, chronic diastolic heart failure, with pt wearing event monitor. Patient seen in September by Dr. Stanford Clark, with complaints of palpitations and near syncope. Patient was scheduled for an exercise treadmill and when she came she was noted to have runs of paroxysmal atrial tachycardia/fibrillation This was cancelled and monitor was placed for her c/o palpitations. She was diagnosed with afib after the intial strips came in and was placed on rate control and anticoagulation on 10/23 by Dr. Stanford Clark. She had taken off her monitor. One week ago, she had taken her monitor off the night before to return earlier and had the worse episode yet. She had placed back, now ready to send back in for using 4 weeks.  She also c/o feeling fatigue, regardless if in SR or afib,  over the last couple of weeks, possibly related to BB.   Today, she denies symptoms of palpitations, chest pain, shortness of breath, orthopnea, PND, lower extremity edema, dizziness, presyncope, syncope, or neurologic sequela. The patient is tolerating medications without difficulties and is otherwise without complaint today.   Past Medical History:  Diagnosis Date  . Atherosclerosis    mild cerebral  . 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 Cervical disc disease    with spondylosis  . Cervical spondylosis   . Chronic dermatitis    eczematous  . Colitis   . DDD (degenerative disc disease), lumbar   . Frozen shoulder    left  . Hyperlipidemia   . Hypertension   . Lateral epicondylitis   . Lumbar spondylosis   . Movement disorder   . Multiple lesions on CT of brain and spine    reports lesions from previous CT scan at [facility]   . 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 CATARACT EXTRACTION    . COLONOSCOPY    . EYE SURGERY Left   . TUBAL  LIGATION  1980    Current Outpatient Medications  Medication Sig Dispense Refill  . apixaban (ELIQUIS) 5 MG TABS tablet Take 5 mg by mouth 2 (two) times daily.    . clobetasol cream (TEMOVATE) 0.81 % Apply 1 application topically 2 (two) times daily as needed (irritation).    . Clobetasol Propionate 0.05 % shampoo Apply 1 application topically daily as needed (itching).     . hydrochlorothiazide (HYDRODIURIL) 25 MG tablet Take 25 mg by mouth every morning.     . hydroxychloroquine (PLAQUENIL) 200 MG tablet Take 200 mg by mouth 2 (two) times daily.    . hydrOXYzine (ATARAX/VISTARIL) 10 MG tablet Take 10 mg by mouth 3 (three) times daily as needed.    . metoprolol succinate (TOPROL XL) 25 MG 24 hr tablet Take 1 tablet (25 mg total) by mouth 2 (two) times daily. 180 tablet 3  . Multiple Vitamins-Minerals (ONE-A-DAY WOMENS 50 PLUS PO) Take 1 tablet by mouth daily.     . Omega-3 Fatty Acids (FISH OIL) 1000 MG CAPS Take 2 capsules by mouth daily.    Marland Kitchen Propylene Glycol (SYSTANE BALANCE) 0.6 % SOLN Apply 1 drop to eye daily as needed (itching).     No current facility-administered medications for this encounter.     Allergies  Allergen Reactions  . Amlodipine     Other reaction(s): Unknown 04/19/15 Patient denies  . Other Other (See Comments)  Medication:Methychloroisothlazolinone/methylisothiazilinoneCL +Me-Isothiazolinone Reaction: unknown-patch testing Medication:glyceryl thioglycolate  Reaction:unknown-patch testing  . Quaternium-15 Other (See Comments)    unknown reaction-patching testing    Social History   Socioeconomic History  . Marital status: Married    Spouse name: Lindsay Clark  . Number of children: 2  . Years of education: MAx2  . Highest education level: Not on file  Social Needs  . Financial resource strain: Not on file  . Food insecurity - worry: Not on file  . Food insecurity - inability: Not on file  . Transportation needs - medical: Not on file  . Transportation  needs - non-medical: Not on file  Occupational History  . Occupation: Retired    Fish farm manager: [employer]  Tobacco Use  . Smoking status: Never Smoker  . Smokeless tobacco: Never Used  Substance and Sexual Activity  . Alcohol use: Yes    Alcohol/week: 0.0 oz    Comment: 4 glasses of wine weekly  . Drug use: No  . Sexual activity: No  Other Topics Concern  . Not on file  Social History Narrative   Patient lives at home with her spouse.   Caffeine Use: 2-3 cups daily    Family History  Problem Relation Age of Onset  . Stroke Mother   . Colon cancer Mother 19  . Heart attack Father 34  . Emphysema Father   . Thyroid disease Son   . Thyroid disease Maternal Grandmother   . Cancer Maternal Grandmother        cervical   . Thrombosis Maternal Grandfather     ROS- All systems are reviewed and negative except as per the HPI above  Physical Exam: Vitals:   02/25/17 1016  BP: 118/64  Pulse: 73  Weight: 202 lb (91.6 kg)  Height: 5\' 5"  (1.651 m)   Wt Readings from Last 3 Encounters:  02/25/17 202 lb (91.6 kg)  02/12/17 202 lb (91.6 kg)  02/08/17 202 lb (91.6 kg)    Labs: Lab Results  Component Value Date   NA 138 04/19/2016   K 2.8 (L) 04/19/2016   CL 100 (L) 04/19/2016   CO2 32 04/19/2016   GLUCOSE 91 04/19/2016   BUN 10 04/19/2016   CREATININE 0.55 04/19/2016   CALCIUM 8.4 (L) 04/19/2016   Lab Results  Component Value Date   INR 0.93 04/18/2016   No results found for: CHOL, HDL, LDLCALC, TRIG   GEN- The patient is well appearing, alert and oriented x 3 today.   Head- normocephalic, atraumatic Eyes-  Sclera clear, conjunctiva pink Ears- hearing intact Oropharynx- clear Neck- supple, no JVP Lymph- no cervical lymphadenopathy Lungs- Clear to ausculation bilaterally, normal work of breathing Heart- Regular rate and rhythm, no murmurs, rubs or gallops, PMI not laterally displaced GI- soft, NT, ND, + BS Extremities- no clubbing, cyanosis, or edema MS- no  significant deformity or atrophy Skin- no rash or lesion Psych- euthymic mood, full affect Neuro- strength and sensation are intact  EKG-refused EKG today Event monitor still on but pt planning to send in today Event strips reviewed    Assessment and Plan: 1.Paroxysmal afib  General education re afib management  Will be helpful to see end monitor  report and afib burden to see if pt needs antiarrythmic For now continue with metoprolol without change but some of her fatigue may be from it  Continue with Eliquis 5 mg bid for chadsvasc score of at least 4   F/u with Dr. Stanford Clark in 3-4 weeks for review of  Holter monitor  Geroge Baseman. Tonimarie Gritz, [facility] [address] [place], [place] [address] [phone]

## 2017-02-28 ENCOUNTER — Telehealth: Payer: Self-pay | Admitting: Cardiology

## 2017-02-28 NOTE — Telephone Encounter (Signed)
New Message     Patient has been wearing a holter monitor and she went to the AFIB clinic on Monday and they sent it back, she wants the results asap , she feels she needs to be seen sooner than what she is schedule , no she will not see the PA

## 2017-02-28 NOTE — Telephone Encounter (Signed)
Spoke with pt, aware it can take up to 2 weeks to get monitor results from the company. Aware will call with recommendations as soon as monitor is available. She is not happy with how she is feeling and wants something done asap but she is aware we are waiting for the monitor results.

## 2017-03-04 ENCOUNTER — Telehealth: Payer: Self-pay | Admitting: *Deleted

## 2017-03-04 NOTE — Telephone Encounter (Addendum)
-----   Message from Lelon Perla, MD sent at 03/04/2017 12:20 PM EST ----- Paroxysmal atrial fibrillation and flutter; would increase toprol to 50 mg in AM and 25 in evening for improved rate control; follow BP and contact us if running low. Lindsay Clark   Left message for pt to call

## 2017-03-05 NOTE — Telephone Encounter (Signed)
Follow up ° ° ° °Patient returning call.  Please call °

## 2017-03-05 NOTE — Telephone Encounter (Signed)
Can schedule fuov next 2-4 weeks Lindsay Clark

## 2017-03-05 NOTE — Telephone Encounter (Signed)
Called and scheduled appt 04-04-17

## 2017-03-05 NOTE — Telephone Encounter (Signed)
Returned call to pt received information. Pt states that she will change medication dosage as directed. She would like Dr [name] "to know" that she is having the feeling of her heart pounding but her HR is low any where from 50-70's and  When her HR is 100-120 she does not "feel" it. she states that she has told "everyone that she has had an appointment" with but has not received an answer for this or why this happens. There is no correlation with heartrate and the feeling of her heart racing. Pt would like to see Holter results. Pt states that she cannot take her BP with auto cuff at home has not been able to for "years". It is just not accurate. She needs manual BP taken. Please note I have tried to explain and she is not receptive. Pt has appt in January but would like an appt to see Dr [name] sooner. She states that her husband states that "this is damaging her heart and needs to be taken care of". Please advise

## 2017-03-08 ENCOUNTER — Encounter: Payer: Self-pay | Admitting: Cardiology

## 2017-03-08 ENCOUNTER — Ambulatory Visit: Payer: Medicare Other | Admitting: Cardiology

## 2017-03-08 VITALS — BP 120/62 | HR 70 | Ht 64.0 in | Wt 202.8 lb

## 2017-03-08 DIAGNOSIS — I10 Essential (primary) hypertension: Secondary | ICD-10-CM

## 2017-03-08 DIAGNOSIS — I5032 Chronic diastolic (congestive) heart failure: Secondary | ICD-10-CM | POA: Diagnosis not present

## 2017-03-08 DIAGNOSIS — I480 Paroxysmal atrial fibrillation: Secondary | ICD-10-CM

## 2017-03-08 MED ORDER — METOPROLOL SUCCINATE ER 25 MG PO TB24
ORAL_TABLET | ORAL | 3 refills | Status: DC
Start: 2017-03-08 — End: 2017-08-27

## 2017-03-08 NOTE — Patient Instructions (Signed)
Medication Instructions:   CHANGE METOPROLOL TO 50 MG IN THE MORNING AND 25 MG IN THE EVENING= 2 TABLETS IN THE MORNING AND 1 TABLET IN THE EVENING  Follow-Up:  Your physician recommends that you schedule a follow-up appointment in: 8-[address] [place]   If you need a refill on your cardiac medications before your next appointment, please call your pharmacy.

## 2017-03-08 NOTE — Progress Notes (Signed)
HPI:FU PAF. 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 Patient seen in September 2017 with complaints of palpitations and near syncope. Patient was scheduled for an exercise treadmill and when she came she was noted to have runs of paroxysmal atrial tachycardia/fibrillation. Procedure was canceled. Monitor October 2018 showed paroxysmal atririllation and flutter. I recommended increasing Toprol. Nuclear study October 2018 showed ejection fraction 67%, apical thinning but no ischemia. Echocardiogram October 2018 showed normal LV systolic function and mild diastolic dysfunction. Placed on apixaban. Since last seen, patient had an episode of dyspnea on exertion. She occasionally feels pressure in her head that she wonders whether may be atrial fibrillation. She occasionally feels a pounding sensation in her chest.Otherwise no dyspnea on exertion, orthopnea, PND, exertional chest pain or syncope. No bleeding.  Current Outpatient Medications  Medication Sig Dispense Refill  . apixaban (ELIQUIS) 5 MG TABS tablet Take 5 mg by mouth 2 (two) times daily.    . clobetasol cream (TEMOVATE) 4.17 % Apply 1 application topically 2 (two) times daily as needed (irritation).    . Clobetasol Propionate 0.05 % shampoo Apply 1 application topically daily as needed (itching).     . hydrochlorothiazide (HYDRODIURIL) 25 MG tablet Take 25 mg by mouth every morning.     . hydroxychloroquine (PLAQUENIL) 200 MG tablet Take 200 mg by mouth 2 (two) times daily.    . hydrOXYzine (ATARAX/VISTARIL) 10 MG tablet Take 10 mg by mouth 3 (three) times daily as needed.    . methylPREDNISolone (MEDROL DOSEPAK) 4 MG TBPK tablet follow package directions    . metoprolol succinate (TOPROL XL) 25 MG 24 hr tablet Take 1 tablet (25 mg total) by mouth 2 (two) times daily. 180 tablet 3  . Multiple Vitamins-Minerals (ONE-A-DAY WOMENS 50 PLUS PO) Take 1 tablet by mouth daily.     . Omega-3 Fatty Acids (FISH OIL) 1000 MG  CAPS Take 2 capsules by mouth daily.    Marland Kitchen Propylene Glycol (SYSTANE BALANCE) 0.6 % SOLN Apply 1 drop to eye daily as needed (itching).     No current facility-administered medications for this visit.      Past Medical History:  Diagnosis Date  . Atherosclerosis    mild cerebral  . 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 Cervical disc disease    with spondylosis  . Cervical spondylosis   . Chronic dermatitis    eczematous  . Colitis   . DDD (degenerative disc disease), lumbar   . Frozen shoulder    left  . Hyperlipidemia   . Hypertension   . Lateral epicondylitis   . Lumbar spondylosis   . Movement disorder   . Multiple lesions on CT of brain and spine    reports lesions from previous CT scan at [facility]   . 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 CATARACT EXTRACTION    . COLONOSCOPY    . EYE SURGERY Left   . TUBAL LIGATION  1980    Social History   Socioeconomic History  . Marital status: Married    Spouse name: [name]  . Number of children: 2  . Years of education: MAx2  . Highest education level: Not on file  Social Needs  . Financial resource strain: Not on file  . Food insecurity - worry: Not on file  . Food insecurity - inability: Not on file  . Transportation needs - medical: Not on file  .  Transportation needs - non-medical: Not on file  Occupational History  . Occupation: Retired    Fish farm manager: [employer]  Tobacco Use  . Smoking status: Never Smoker  . Smokeless tobacco: Never Used  Substance and Sexual Activity  . Alcohol use: Yes    Alcohol/week: 0.0 oz    Comment: 4 glasses of wine weekly  . Drug use: No  . Sexual activity: No  Other Topics Concern  . Not on file  Social History Narrative   Patient lives at home with her spouse.   Caffeine Use: 2-3 cups daily    Family History  Problem Relation Age of Onset  . Stroke Mother   . Colon cancer Mother 6  . Heart attack Father 75  . Emphysema Father   .  Thyroid disease Son   . Thyroid disease Maternal Grandmother   . Cancer Maternal Grandmother        cervical   . Thrombosis Maternal Grandfather     ROS: no fevers or chills, productive cough, hemoptysis, dysphasia, odynophagia, melena, hematochezia, dysuria, hematuria, rash, seizure activity, orthopnea, PND, pedal edema, claudication. Remaining systems are negative.  Physical Exam: Well-developed well-nourished in no acute distress.  Skin is warm and dry.  HEENT is normal.  Neck is supple.  Chest is clear to auscultation with normal expansion.  Cardiovascular exam is regular rate and rhythm.  Abdominal exam nontender or distended. No masses palpated. Extremities show no edema. neuro grossly intact  ECG- sinus rhythm, rate of 70. Biatrial enlargement.personally reviewed  A/P  1 paroxysmal atrial fibrillation-LV function is normal. Continue apixaban. Long discussion today concerning rate control versus rhythm control.It is not clear to me that her symptoms are related to atrial fibrillation and she states she cannot decipher when she is in atrial fibrillation. I have recommended alivecor to see if we can correlate symptoms with rhythm disturbance. If she is symptomatic she would benefit from ablation versus antiarrhythmic therapy. Otherwise would continue Toprol for rate control. The airway she will need anticoagulation long-term.  2 hypertension-blood pressure is controlled. Continue present medications.  3 chronic diastolic congestive heart failure-she is euvolemic. Continue Lasix as needed. Continue low sodium diet.  4 chest pain-she has not had any further chest pain and prior nuclear study negative.  Kirk Ruths, MD

## 2017-04-04 ENCOUNTER — Ambulatory Visit: Payer: Self-pay | Admitting: Cardiology

## 2017-04-05 ENCOUNTER — Ambulatory Visit: Payer: Self-pay | Admitting: Cardiology

## 2017-04-17 DIAGNOSIS — E785 Hyperlipidemia, unspecified: Secondary | ICD-10-CM | POA: Insufficient documentation

## 2017-04-17 DIAGNOSIS — M7710 Lateral epicondylitis, unspecified elbow: Secondary | ICD-10-CM | POA: Insufficient documentation

## 2017-04-17 DIAGNOSIS — R937 Abnormal findings on diagnostic imaging of other parts of musculoskeletal system: Secondary | ICD-10-CM

## 2017-04-17 DIAGNOSIS — M1990 Unspecified osteoarthritis, unspecified site: Secondary | ICD-10-CM | POA: Insufficient documentation

## 2017-04-17 DIAGNOSIS — G259 Extrapyramidal and movement disorder, unspecified: Secondary | ICD-10-CM | POA: Insufficient documentation

## 2017-04-17 DIAGNOSIS — M7500 Adhesive capsulitis of unspecified shoulder: Secondary | ICD-10-CM | POA: Insufficient documentation

## 2017-04-17 DIAGNOSIS — M5136 Other intervertebral disc degeneration, lumbar region: Secondary | ICD-10-CM | POA: Insufficient documentation

## 2017-04-17 DIAGNOSIS — M47816 Spondylosis without myelopathy or radiculopathy, lumbar region: Secondary | ICD-10-CM | POA: Insufficient documentation

## 2017-04-17 DIAGNOSIS — K529 Noninfective gastroenteritis and colitis, unspecified: Secondary | ICD-10-CM | POA: Insufficient documentation

## 2017-04-17 DIAGNOSIS — M5090 Cervical disc disorder, unspecified, unspecified cervical region: Secondary | ICD-10-CM | POA: Insufficient documentation

## 2017-04-17 DIAGNOSIS — R930 Abnormal findings on diagnostic imaging of skull and head, not elsewhere classified: Secondary | ICD-10-CM | POA: Insufficient documentation

## 2017-04-17 DIAGNOSIS — M47812 Spondylosis without myelopathy or radiculopathy, cervical region: Secondary | ICD-10-CM | POA: Insufficient documentation

## 2017-04-17 DIAGNOSIS — L309 Dermatitis, unspecified: Secondary | ICD-10-CM | POA: Insufficient documentation

## 2017-05-02 NOTE — Progress Notes (Signed)
HPI: FUPAF. 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 Patient seen in September 2017 with complaints of palpitations and near syncope. Patient was scheduled for an exercise treadmill and when she came she was noted to have runs of paroxysmal atrial tachycardia/fibrillation. Procedure was canceled. Monitor October 2018 showed paroxysmal atririllation and flutter. I recommended increasing Toprol. Nuclear study October 2018 showed ejection fraction 67%, apical thinning but no ischemia.Echocardiogram October 2018 showed normal LV systolic function and mild diastolic dysfunction. Placed on apixaban. Since last seen, she denies dyspnea, chest pain or syncope. She complains of pedal edema. She occasionally has palpitations but improved.  Current Outpatient Medications  Medication Sig Dispense Refill  . apixaban (ELIQUIS) 5 MG TABS tablet Take 5 mg by mouth 2 (two) times daily.    . Clobetasol Propionate 0.05 % shampoo Apply 1 application topically daily as needed (itching).     . hydrochlorothiazide (HYDRODIURIL) 25 MG tablet Take 25 mg by mouth every morning.     . hydroxychloroquine (PLAQUENIL) 200 MG tablet Take 200 mg by mouth 2 (two) times daily.    . hydrOXYzine (ATARAX/VISTARIL) 10 MG tablet Take 10 mg by mouth 3 (three) times daily as needed.    . metoprolol succinate (TOPROL XL) 25 MG 24 hr tablet Take 2 tablets in the morning and 1 tablet in the evening 180 tablet 3  . Multiple Vitamins-Minerals (ONE-A-DAY WOMENS 50 PLUS PO) Take 1 tablet by mouth daily.     Marland Kitchen Propylene Glycol (SYSTANE BALANCE) 0.6 % SOLN Apply 1 drop to eye daily as needed (itching).     No current facility-administered medications for this visit.      Past Medical History:  Diagnosis Date  . Atherosclerosis    mild cerebral  . 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 Cervical disc disease    with spondylosis  . Cervical spondylosis   . Chronic dermatitis    eczematous  . Colitis   . DDD  (degenerative disc disease), lumbar   . Frozen shoulder    left  . Hyperlipidemia   . Hypertension   . Lateral epicondylitis   . Lumbar spondylosis   . Movement disorder   . Multiple lesions on CT of brain and spine    reports lesions from [facility] CT [facility]   . 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 CATARACT EXTRACTION    . COLONOSCOPY    . EYE SURGERY Left   . TUBAL LIGATION  1980    Social History   Socioeconomic History  . Marital status: Married    Spouse name: [name]  . Number of children: 2  . Years of education: MAx2  . Highest education level: Not on file  Social Needs  . Financial resource strain: Not on file  . Food insecurity - worry: Not on file  . Food insecurity - inability: Not on file  . Transportation needs - medical: Not on file  . Transportation needs - non-medical: Not on file  Occupational History  . Occupation: Retired    Fish farm manager: [employer]  Tobacco Use  . Smoking status: Never Smoker  . Smokeless tobacco: Never Used  Substance and Sexual Activity  . Alcohol use: Yes    Alcohol/week: 0.0 oz    Comment: 4 glasses of wine weekly  . Drug use: No  . Sexual activity: No  Other Topics Concern  . Not on file  Social  History Narrative   Patient lives at home with her spouse.   Caffeine Use: 2-3 cups daily    Family History  Problem Relation Age of Onset  . Stroke Mother   . Colon cancer Mother 19  . Heart attack Father 61  . Emphysema Father   . Thyroid disease Son   . Thyroid disease Maternal Grandmother   . Cancer Maternal Grandmother        cervical   . Thrombosis Maternal Grandfather     ROS: no fevers or chills, productive cough, hemoptysis, dysphasia, odynophagia, melena, hematochezia, dysuria, hematuria, rash, seizure activity, orthopnea, PND, claudication. Remaining systems are negative.  Physical Exam: Well-developed well-nourished in no acute distress.  Skin is warm and  dry.  HEENT is normal.  Neck is supple.  Chest is clear to auscultation with normal expansion.  Cardiovascular exam is regular rate and rhythm.  Abdominal exam nontender or distended. No masses palpated. Extremities show trace edema. neuro grossly intact  ECG- normal sinus rhythm at a rate of 66. No ST changes.personally reviewed  A/P  1 paroxysmal atrial fibrillation-patient remains in sinus rhythm today. Continue apixaban. Check hemoglobin and renal function. Continue Toprol for rate control if atrial fibrillation recurs.  2 hypertension-blood pressure is controlled. Continue present medications.  3 chronic diastolic congestive heart failure-patient complains of increased pedal edema. She has trace edema on examination. She was recently started on Lasix 20 mg daily and she continues on hydrochlorothiazide. Check potassium, renal function, BNP. I've asked her to keep her feet elevated. We discussed low sodium diet and fluid restriction to 1.5 L daily.  Kirk Ruths, MD

## 2017-05-07 ENCOUNTER — Ambulatory Visit: Payer: Self-pay | Admitting: Cardiology

## 2017-05-08 ENCOUNTER — Encounter: Payer: Self-pay | Admitting: Cardiology

## 2017-05-08 ENCOUNTER — Ambulatory Visit: Payer: Medicare Other | Admitting: Cardiology

## 2017-05-08 VITALS — BP 116/78 | HR 66 | Ht 64.0 in | Wt 205.0 lb

## 2017-05-08 DIAGNOSIS — I10 Essential (primary) hypertension: Secondary | ICD-10-CM

## 2017-05-08 DIAGNOSIS — R0602 Shortness of breath: Secondary | ICD-10-CM

## 2017-05-08 DIAGNOSIS — I480 Paroxysmal atrial fibrillation: Secondary | ICD-10-CM

## 2017-05-08 NOTE — Patient Instructions (Signed)

## 2017-05-27 ENCOUNTER — Other Ambulatory Visit: Payer: Self-pay | Admitting: Orthopaedic Surgery

## 2017-05-27 DIAGNOSIS — M5416 Radiculopathy, lumbar region: Secondary | ICD-10-CM

## 2017-05-30 ENCOUNTER — Other Ambulatory Visit: Payer: Self-pay

## 2017-06-01 ENCOUNTER — Ambulatory Visit
Admission: RE | Admit: 2017-06-01 | Discharge: 2017-06-01 | Disposition: A | Discharge status: Home / Self Care | Payer: Medicare Other | Source: Ambulatory Visit | Attending: Orthopaedic Surgery | Admitting: Orthopaedic Surgery

## 2017-06-01 DIAGNOSIS — M5416 Radiculopathy, lumbar region: Secondary | ICD-10-CM

## 2017-06-09 ENCOUNTER — Encounter: Payer: Self-pay | Admitting: Cardiology

## 2017-06-10 ENCOUNTER — Telehealth: Payer: Self-pay | Admitting: *Deleted

## 2017-06-10 NOTE — Telephone Encounter (Signed)
Left message for pt to call.

## 2017-06-10 NOTE — Telephone Encounter (Signed)
Actually this was urgent yesterday (Saturday, 06/08/17, 5:15-9:30 p.m.); however, there was no where locally to go for diagnosis or assistance except hospital ERs or [facility]. Having been to [facility] ER twice and [facility] once and received poor care all three times, I did not go those locations.  I felt the strong hammering of my heart, multiple short periods of pressure in my head (about to explode) though no headache, the feeling that I might collapse, and a sitting heart rate that was widely variable: 94 to 74 in 2 or 3 seconds. The top rate was 108. The rapid increases and decreases were probably responsible for my feelings. I had no fever . I did not take my blood pressure.  I think we need to determine what is happening and how to treat the condition.  Lindsay Clark  03/11/1941

## 2017-06-10 NOTE — Telephone Encounter (Signed)
Spoke with pt, she has an episode on Saturday and Sunday of this feeling of her heart pounding hard and she has a feeling in her head and fatigue. The episode on Sunday did not last as long as Saturday. I offered the patient an appointment tomorrow to see dr [name] and she declined because she is not going to be able to tell him anything more than this. She denied any chest pain, SOB but reports belching a lot. She would like me to forward this information to dr [name].

## 2017-06-11 NOTE — Telephone Encounter (Signed)
Spoke with pt, aware dr [name] is not going to make any changes at this time. Discussed with patient about Psychologist, forensic for her cell phone.

## 2017-06-18 ENCOUNTER — Telehealth: Payer: Self-pay | Admitting: Cardiology

## 2017-06-18 NOTE — Telephone Encounter (Signed)
Spoke with pt, aware will send information to EP scheduling.

## 2017-06-18 NOTE — Telephone Encounter (Signed)
Returned call to patient who saw PCP on Feb 25. She told her PCP that she is having problems (phone note 2/18 for reference) and Dr. [name] told her to come back and see him in 6 months. She states she is not waiting to see somebody that far out. She looked up the doctors on [facility] who treat atrial fibrillation - Allred, Lorenz Coaster. She told this to her PCP who recommended Dr. Rayann Heman.    She spoke with Hilda Blades RN last week about her AF episodes. It was recommended that she could come in for an appointment but she declined, AliveCor monitor was also recommended. She states she was told she could purchase the monitor from [place] which she was hesitant about. Explained that she can purchase thru [employer] which she felt more comfortable about - explained that the website has videos and info about how the device works, etc.   Advised would defer to MD to see if he would like to refer her to EP for recurrent AF issues? Or any other recommendations.

## 2017-06-18 NOTE — Telephone Encounter (Signed)
New Message   Patient states that she saw her PCP on 06/17/2017 who is advising her that she needs to see Dr. Rayann Heman. The patient already sees Dr. [name]. She is unsure why the PCP wants her to see Dr. Rayann Heman because she did not mention a pacemaker or anything. Please call to discuss.

## 2017-06-18 NOTE — Telephone Encounter (Signed)
Ok to see Dr Rayann Heman Kirk Ruths

## 2017-06-24 ENCOUNTER — Encounter: Payer: Self-pay | Admitting: Internal Medicine

## 2017-06-24 ENCOUNTER — Ambulatory Visit: Payer: Medicare Other | Admitting: Internal Medicine

## 2017-06-24 VITALS — BP 128/84 | HR 71 | Ht 64.0 in | Wt 204.0 lb

## 2017-06-24 DIAGNOSIS — I5032 Chronic diastolic (congestive) heart failure: Secondary | ICD-10-CM | POA: Diagnosis not present

## 2017-06-24 DIAGNOSIS — R0683 Snoring: Secondary | ICD-10-CM

## 2017-06-24 DIAGNOSIS — I10 Essential (primary) hypertension: Secondary | ICD-10-CM | POA: Diagnosis not present

## 2017-06-24 DIAGNOSIS — I480 Paroxysmal atrial fibrillation: Secondary | ICD-10-CM | POA: Diagnosis not present

## 2017-06-24 DIAGNOSIS — G939 Disorder of brain, unspecified: Secondary | ICD-10-CM | POA: Insufficient documentation

## 2017-06-24 NOTE — Patient Instructions (Addendum)
Medication Instructions:  Your physician recommends that you continue on your current medications as directed. Please refer to the Current Medication list given to you today.  Labwork: None ordered.  Testing/Procedures: None ordered.  Follow-Up:  Please call our office if you are interested in a LINQ (loop recorder). Ask for Cristopher Estimable RN [phone].  Your physician wants you to follow-up as scheduled with Dr. [name]  Any Other Special Instructions Will Be Listed Below (If Applicable).  Referral made to Dr. Carlena Sax for sleep apnea.  If you need a refill on your cardiac medications before your next appointment, please call your pharmacy.   Implantable Loop Recorder Placement An implantable loop recorder is a small electronic device that is placed under the skin of your chest. It is about the size of an AA ("double A") battery. The device records the electrical activity of your heart over a long period of time. Your health care provider can download these recordings to monitor your heart. You may need an implantable loop recorder if you have periods of abnormal heart activity (arrhythmias) or unexplained fainting (syncope) caused by a heart problem. Tell a health care provider about:  Any allergies you have.  All medicines you are taking, including vitamins, herbs, eye drops, creams, and over-the-counter medicines.  Any problems you or family members have had with anesthetic medicines.  Any blood disorders you have.  Any surgeries you have had.  Any medical conditions you have.  Whether you are pregnant or may be pregnant. What are the risks? Generally, this is a safe procedure. However, as with any procedure, problems may occur, including:  Infection.  Bleeding.  Allergic reactions to anesthetic medicines.  Damage to nerves or blood vessels.  Failure of the device to work. This could require another surgery to replace it.  What happens before the  procedure?   You may have a physical exam, blood tests, and imaging tests of your heart, such as a chest X-ray.  Follow instructions from your health care provider about eating or drinking restrictions.  Ask your health care provider about: ? Changing or stopping your regular medicines. This is especially important if you are taking diabetes medicines or blood thinners. ? Taking medicines such as aspirin and ibuprofen. These medicines can thin your blood. Do not take these medicines before your procedure if your surgeon instructs you not to.  Ask your health care provider how your surgical site will be marked or identified.  You may be given antibiotic medicine to help prevent infection.  Plan to have someone take you home after the procedure.  If you will be going home right after the procedure, plan to have someone with you for 24 hours.  Do not use any tobacco products, such as cigarettes, chewing tobacco, and e-cigarettes as told by your surgeon. If you need help quitting, ask your health care provider. What happens during the procedure?  To reduce your risk of infection: ? Your health care team will wash or sanitize their hands. ? Your skin will be washed with soap.  An IV tube will be inserted into one of your veins.  You may be given an antibiotic medicine through the IV tube.  You may be given one or more of the following: ? A medicine to help you relax (sedative). ? A medicine to numb the area (local anesthetic).  A small cut (incision) will be made on the left side of your upper chest.  A pocket will be created under your  skin.  The device will be placed in the pocket.  The incision will be closed with stitches (sutures) or adhesive strips.  A bandage (dressing) will be placed over the incision. The procedure may vary among health care providers and hospitals. What happens after the procedure?  Your blood pressure, heart rate, breathing rate, and blood oxygen  level will be monitored often until the medicines you were given have worn off.  You may be able to go home on the day of your surgery. Before going home: ? Your health care provider will program your recorder. ? You will learn how to trigger your device with a handheld activator. ? You will learn how to send recordings to your health care provider. ? You will get an ID card for your device, and you will be told when to use it.  Do not drive for 24 hours if you received a sedative. This information is not intended to replace advice given to you by your health care provider. Make sure you discuss any questions you have with your health care provider. Document Released: 03/21/2015 Document Revised: 09/15/2015 Document Reviewed: 01/12/2015 Elsevier Interactive Patient Education  [employer].

## 2017-06-24 NOTE — Progress Notes (Signed)
Electrophysiology Office Note   Date:  06/24/2017   ID:  Lindsay Clark, DOB [birth year], MRN [id number]  PCP:  [name], MD  Cardiologist:  Dr [name] Primary Electrophysiologist: Thompson Grayer, MD    CC: afib   History of Present Illness: Lindsay Clark is a 77 y.o. female who presents today for electrophysiology evaluation.   She reports having palpitations since 2016.  She was evaluated by Dr [name] and wore a monitor 10/18.  This documented afib, atrial flutter, and atach.  She has been placed on eliquis and toprol for rate control.  She continues to have occasional palpitations.  These occur at night and are short lived.  She also has episodes of "heart pounding" which she does not think are arrhythmogenic in nature.  She has had syncope previously, which by history sounds like post termination pauses potentially.  She snores.  She has not had a sleep study.  She drinks ETOh several days per week.   Today, she denies symptoms of chest pain, shortness of breath, orthopnea, PND, lower extremity edema, claudication, further syncope, bleeding, or neurologic sequela. The patient is tolerating medications without difficulties and is otherwise without complaint today.    Past Medical History:  Diagnosis Date  . Atherosclerosis    mild cerebral  . 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 Cervical disc disease    with spondylosis  . Cervical spondylosis   . Chronic dermatitis    eczematous  . Colitis   . DDD (degenerative disc disease), lumbar   . Frozen shoulder    left  . Hyperlipidemia   . Hypertension   . Lateral epicondylitis   . Lumbar spondylosis   . Movement disorder   . Multiple lesions on CT of brain and spine    reports lesions from [facility]   . 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 CATARACT EXTRACTION    . COLONOSCOPY    . EYE SURGERY Left   . TUBAL LIGATION  1980     Current Outpatient  Medications  Medication Sig Dispense Refill  . apixaban (ELIQUIS) 5 MG TABS tablet Take 5 mg by mouth 2 (two) times daily.    . Clobetasol Propionate 0.05 % shampoo Apply 1 application topically daily as needed (itching).     . furosemide (LASIX) 20 MG tablet Take 20 mg by mouth daily.    . hydrochlorothiazide (HYDRODIURIL) 25 MG tablet Take 25 mg by mouth every morning.     . hydroxychloroquine (PLAQUENIL) 200 MG tablet Take 200 mg by mouth 2 (two) times daily.    . hydrOXYzine (ATARAX/VISTARIL) 10 MG tablet Take 10 mg by mouth 3 (three) times daily as needed.    . metoprolol succinate (TOPROL XL) 25 MG 24 hr tablet Take 2 tablets in the morning and 1 tablet in the evening 180 tablet 3  . Multiple Vitamins-Minerals (ONE-A-DAY WOMENS 50 PLUS PO) Take 1 tablet by mouth daily.     Marland Kitchen Propylene Glycol (SYSTANE BALANCE) 0.6 % SOLN Apply 1 drop to eye daily as needed (itching).     No current facility-administered medications for this visit.     Allergies:   Amlodipine; Other; and Quaternium-15   Social History:  The patient  reports that  has never smoked. she has never used smokeless tobacco. She reports that she drinks alcohol. She reports that she does not use drugs.   Family History:  The patient's  family history includes Cancer in her maternal grandmother; Colon cancer (age of onset: 38) in her mother; Emphysema in her father; Heart attack (age of onset: 41) in her father; Stroke in her mother; Thrombosis in her maternal grandfather; Thyroid disease in her maternal grandmother and son.    ROS:  Please see the history of present illness.   All other systems are personally reviewed and negative.    PHYSICAL EXAM: VS:  Ht 5\' 4"  (1.626 m)   BMI 35.19 kg/m  , BMI Body mass index is 35.19 kg/m. GEN: overweight, in no acute distress  HEENT: normal  Neck: no JVD, carotid bruits, or masses Cardiac: RRR; no murmurs, rubs, or gallops,no edema  Respiratory:  clear to auscultation bilaterally,  normal work of breathing GI: soft, nontender, nondistended, + BS MS: no deformity or atrophy  Skin: warm and dry  Neuro:  Strength and sensation are intact Psych: euthymic mood, full affect  EKG:  EKG is ordered today. The ekg ordered today is personally reviewed and shows sinus rhythm 71 bpm, PR 184 msec, QRS 92 msec, Qtc 469 msec     Wt Readings from Last 3 Encounters:  05/08/17 205 lb (93 kg)  03/08/17 202 lb 12.8 oz (92 kg)  02/25/17 202 lb (91.6 kg)      Other studies personally reviewed: Additional studies/ records that were reviewed today include: prior echo, event monitor personally reviewed, Dr Jacalyn Lefevre notes  Review of the above records today demonstrates: as above   ASSESSMENT AND PLAN:  1.  Atrial arrhythmias We discussed management including AADs at length today. She does not currently wish to make changes.  We also discussed lifestyle modification including evaluation of snoring (see below), regular exercise, weight loss, and ETOH reduction.   chads2vasc score is 3.  She is on eliquis and tolerating this well. We also discussed ILR for further evaluation and management of her symptoms.  She will consider this option and contact my office if she wishes to proceed.  2. Palpitations She describes heart "pounding" separately from arrhythmias.  We discussed sodium avoidance, caffeine/ stimulant avoidance, and ETOH moderation.  Also discussed implantable loop recorder at length today.  Risks and benefits to ILR discussed with her.  She will contact my office if she decides to proceed.  3. Syncope My suspicion is post termination pauses though this has not been documented.  Echo and ekg are low risk.  She will consider ILR as above  4. Snoring Will refer to Dr Maxwell Caul for further evaluation  5. HTN Stable No change required today Lifestyle modification discussed at length  6. Obesity Body mass index is 35.02 kg/m. she does not seem motivated to make  change  7. Venous insufficiency She is worried about leg swelling  States "Dr [name] has not done anything about this".  She does admit that he advised support hose which she has not been compliant with.  Weight loss and sodium restriction also encouraged   Follow-up:  With Dr [name] as scheduled I am happy to see should she decide to consider ILR or AAD therapy  Current medicines are reviewed at length with the patient today.   The patient does not have concerns regarding her medicines.  The following changes were made today:  none    Signed, Thompson Grayer, MD  06/24/2017 8:47 AM     [facility] [address] [place]  [address] [phone] (office) [phone] (fax)

## 2017-06-24 NOTE — H&P (View-Only) (Signed)
Electrophysiology Office Note   Date:  06/24/2017   ID:  PHYLIS JAVED, DOB [birth year], MRN [id number]  PCP:  [name], MD  Cardiologist:  Dr [name] Primary Electrophysiologist: Thompson Grayer, MD    CC: afib   History of Present Illness: Lindsay Clark is a 77 y.o. female who presents today for electrophysiology evaluation.   She reports having palpitations since 2016.  She was evaluated by Dr [name] and wore a monitor 10/18.  This documented afib, atrial flutter, and atach.  She has been placed on eliquis and toprol for rate control.  She continues to have occasional palpitations.  These occur at night and are short lived.  She also has episodes of "heart pounding" which she does not think are arrhythmogenic in nature.  She has had syncope previously, which by history sounds like post termination pauses potentially.  She snores.  She has not had a sleep study.  She drinks ETOh several days per week.   Today, she denies symptoms of chest pain, shortness of breath, orthopnea, PND, lower extremity edema, claudication, further syncope, bleeding, or neurologic sequela. The patient is tolerating medications without difficulties and is otherwise without complaint today.    Past Medical History:  Diagnosis Date  . Atherosclerosis    mild cerebral  . 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 Cervical disc disease    with spondylosis  . Cervical spondylosis   . Chronic dermatitis    eczematous  . Colitis   . DDD (degenerative disc disease), lumbar   . Frozen shoulder    left  . Hyperlipidemia   . Hypertension   . Lateral epicondylitis   . Lumbar spondylosis   . Movement disorder   . Multiple lesions on CT of brain and spine    reports lesions from previous CT scan at [facility]   . 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 CATARACT EXTRACTION    . COLONOSCOPY    . EYE SURGERY Left   . TUBAL LIGATION  1980     Current Outpatient  Medications  Medication Sig Dispense Refill  . apixaban (ELIQUIS) 5 MG TABS tablet Take 5 mg by mouth 2 (two) times daily.    . Clobetasol Propionate 0.05 % shampoo Apply 1 application topically daily as needed (itching).     . furosemide (LASIX) 20 MG tablet Take 20 mg by mouth daily.    . hydrochlorothiazide (HYDRODIURIL) 25 MG tablet Take 25 mg by mouth every morning.     . hydroxychloroquine (PLAQUENIL) 200 MG tablet Take 200 mg by mouth 2 (two) times daily.    . hydrOXYzine (ATARAX/VISTARIL) 10 MG tablet Take 10 mg by mouth 3 (three) times daily as needed.    . metoprolol succinate (TOPROL XL) 25 MG 24 hr tablet Take 2 tablets in the morning and 1 tablet in the evening 180 tablet 3  . Multiple Vitamins-Minerals (ONE-A-DAY WOMENS 50 PLUS PO) Take 1 tablet by mouth daily.     Marland Kitchen Propylene Glycol (SYSTANE BALANCE) 0.6 % SOLN Apply 1 drop to eye daily as needed (itching).     No current facility-administered medications for this visit.     Allergies:   Amlodipine; Other; and Quaternium-15   Social History:  The patient  reports that  has never smoked. she has never used smokeless tobacco. She reports that she drinks alcohol. She reports that she does not use drugs.   Family History:  The patient's  family history includes Cancer in her maternal grandmother; Colon cancer (age of onset: 22) in her mother; Emphysema in her father; Heart attack (age of onset: 24) in her father; Stroke in her mother; Thrombosis in her maternal grandfather; Thyroid disease in her maternal grandmother and son.    ROS:  Please see the history of present illness.   All other systems are personally reviewed and negative.    PHYSICAL EXAM: VS:  Ht 5\' 4"  (1.626 m)   BMI 35.19 kg/m  , BMI Body mass index is 35.19 kg/m. GEN: overweight, in no acute distress  HEENT: normal  Neck: no JVD, carotid bruits, or masses Cardiac: RRR; no murmurs, rubs, or gallops,no edema  Respiratory:  clear to auscultation bilaterally,  normal work of breathing GI: soft, nontender, nondistended, + BS MS: no deformity or atrophy  Skin: warm and dry  Neuro:  Strength and sensation are intact Psych: euthymic mood, full affect  EKG:  EKG is ordered today. The ekg ordered today is personally reviewed and shows sinus rhythm 71 bpm, PR 184 msec, QRS 92 msec, Qtc 469 msec     Wt Readings from Last 3 Encounters:  05/08/17 205 lb (93 kg)  03/08/17 202 lb 12.8 oz (92 kg)  02/25/17 202 lb (91.6 kg)      Other studies personally reviewed: Additional studies/ records that were reviewed today include: prior echo, event monitor personally reviewed, Dr Jacalyn Lefevre notes  Review of the above records today demonstrates: as above   ASSESSMENT AND PLAN:  1.  Atrial arrhythmias We discussed management including AADs at length today. She does not currently wish to make changes.  We also discussed lifestyle modification including evaluation of snoring (see below), regular exercise, weight loss, and ETOH reduction.   chads2vasc score is 3.  She is on eliquis and tolerating this well. We also discussed ILR for further evaluation and management of her symptoms.  She will consider this option and contact my office if she wishes to proceed.  2. Palpitations She describes heart "pounding" separately from arrhythmias.  We discussed sodium avoidance, caffeine/ stimulant avoidance, and ETOH moderation.  Also discussed implantable loop recorder at length today.  Risks and benefits to ILR discussed with her.  She will contact my office if she decides to proceed.  3. Syncope My suspicion is post termination pauses though this has not been documented.  Echo and ekg are low risk.  She will consider ILR as above  4. Snoring Will refer to Dr Maxwell Caul for further evaluation  5. HTN Stable No change required today Lifestyle modification discussed at length  6. Obesity Body mass index is 35.02 kg/m. she does not seem motivated to make  change  7. Venous insufficiency She is worried about leg swelling  States "Dr [name] has not done anything about this".  She does admit that he advised support hose which she has not been compliant with.  Weight loss and sodium restriction also encouraged   Follow-up:  With Dr [name] as scheduled I am happy to see should she decide to consider ILR or AAD therapy  Current medicines are reviewed at length with the patient today.   The patient does not have concerns regarding her medicines.  The following changes were made today:  none    Signed, Thompson Grayer, MD  06/24/2017 8:47 AM     [facility] [address] [place] [address] [phone] (office) [phone] (fax)

## 2017-07-01 ENCOUNTER — Telehealth: Payer: Self-pay | Admitting: *Deleted

## 2017-07-01 NOTE — Telephone Encounter (Signed)
-----   Message from Lindsay Clark, [place] sent at 07/01/2017  9:11 AM EDT ----- Pt has some questions regarding the LINQ and whether or not it is dependant on cellular reception or WiFi.  Pt would like someone from the device clinic to call her at [phone] and if she is not available she has given permission to speak with her husband.  She may have several questions.  Thank you  Concepcion Living

## 2017-07-01 NOTE — Telephone Encounter (Signed)
LMOM to return call to Device Clinic. 

## 2017-07-02 ENCOUNTER — Telehealth: Payer: Self-pay

## 2017-07-02 NOTE — Telephone Encounter (Signed)
Spoke w/ pt and informed her that the monitor works on cell signal but if the cell signal is not strong enough in her area she can hook the monitor up to her Internet router. She had further questions about the procedure call routed to Device [employer].

## 2017-07-02 NOTE — Telephone Encounter (Signed)
Patient called with questions about the ILR implant and home monitor. I spent >66min on the phone answering patient questions. We dicussed location of implant, size of insision and removal. We additionally discussed the home monitor, how it works and frequency of transmission with the option of manual transmissions. We discussed traveling, taking vs not taking her home monitor and safety while traveling. I talked through use of the symptom activator and the purpose of this. I reviewed the approximate size of the home monitor and the option for a carrying case for travel. At the end of the conversation patient stated that she would like to move forward with device implant. I will message nurse for scheduling. Patient was agreeable to this plan.

## 2017-07-03 NOTE — Telephone Encounter (Signed)
Pt would like to have LINQ implanted Friday.  She is aware I will arrange and call her back tomorrow.

## 2017-07-04 NOTE — Telephone Encounter (Signed)
Reviewed instructions with patient. Advised to wash chest with antibacterial soap tonight/tomorrow morning. Wound check scheduled for 3/28 Patient verbalized understanding and agreeable to plan.

## 2017-07-05 ENCOUNTER — Encounter (HOSPITAL_COMMUNITY): Payer: Self-pay | Admitting: Internal Medicine

## 2017-07-05 ENCOUNTER — Encounter (HOSPITAL_COMMUNITY)
Admission: RE | Disposition: A | Discharge status: Home / Self Care | Payer: Self-pay | Source: Ambulatory Visit | Attending: Internal Medicine

## 2017-07-05 ENCOUNTER — Ambulatory Visit (HOSPITAL_COMMUNITY)
Admission: RE | Admit: 2017-07-05 | Discharge: 2017-07-05 | Disposition: A | Discharge status: Home / Self Care | Payer: Medicare Other | Source: Ambulatory Visit | Attending: Internal Medicine | Admitting: Internal Medicine

## 2017-07-05 DIAGNOSIS — M5136 Other intervertebral disc degeneration, lumbar region: Secondary | ICD-10-CM | POA: Diagnosis not present

## 2017-07-05 DIAGNOSIS — Z6835 Body mass index (BMI) 35.0-35.9, adult: Secondary | ICD-10-CM | POA: Insufficient documentation

## 2017-07-05 DIAGNOSIS — E669 Obesity, unspecified: Secondary | ICD-10-CM | POA: Insufficient documentation

## 2017-07-05 DIAGNOSIS — I10 Essential (primary) hypertension: Secondary | ICD-10-CM | POA: Diagnosis not present

## 2017-07-05 DIAGNOSIS — Z7901 Long term (current) use of anticoagulants: Secondary | ICD-10-CM | POA: Diagnosis not present

## 2017-07-05 DIAGNOSIS — Z8249 Family history of ischemic heart disease and other diseases of the circulatory system: Secondary | ICD-10-CM | POA: Diagnosis not present

## 2017-07-05 DIAGNOSIS — M19042 Primary osteoarthritis, left hand: Secondary | ICD-10-CM | POA: Insufficient documentation

## 2017-07-05 DIAGNOSIS — I4891 Unspecified atrial fibrillation: Secondary | ICD-10-CM | POA: Diagnosis not present

## 2017-07-05 DIAGNOSIS — M19041 Primary osteoarthritis, right hand: Secondary | ICD-10-CM | POA: Diagnosis not present

## 2017-07-05 DIAGNOSIS — R55 Syncope and collapse: Secondary | ICD-10-CM | POA: Diagnosis not present

## 2017-07-05 DIAGNOSIS — E785 Hyperlipidemia, unspecified: Secondary | ICD-10-CM | POA: Diagnosis not present

## 2017-07-05 DIAGNOSIS — I4892 Unspecified atrial flutter: Secondary | ICD-10-CM | POA: Insufficient documentation

## 2017-07-05 DIAGNOSIS — I872 Venous insufficiency (chronic) (peripheral): Secondary | ICD-10-CM | POA: Insufficient documentation

## 2017-07-05 HISTORY — PX: LOOP RECORDER INSERTION: EP1214

## 2017-07-05 SURGERY — LOOP RECORDER INSERTION

## 2017-07-05 MED ORDER — LIDOCAINE-EPINEPHRINE 1 %-1:100000 IJ SOLN
INTRAMUSCULAR | Status: AC
  Filled 2017-07-05: qty 1

## 2017-07-05 MED ORDER — LIDOCAINE-EPINEPHRINE 1 %-1:100000 IJ SOLN
INTRAMUSCULAR | Status: DC | PRN
  Administered 2017-07-05: 25 mL

## 2017-07-05 SURGICAL SUPPLY — 2 items
LOOP REVEAL LINQSYS (Prosthesis & Implant Heart) ×2 IMPLANT
PACK LOOP INSERTION (CUSTOM PROCEDURE TRAY) ×2 IMPLANT

## 2017-07-05 NOTE — Interval H&P Note (Signed)
History and Physical Interval Note:  07/05/2017 7:25 AM  Lindsay Clark  has presented today for surgery, with the diagnosis of palpitations, syncope  The various methods of treatment have been discussed with the patient and family. After consideration of risks, benefits and other options for treatment, the patient has consented to  Procedure(s): LOOP RECORDER INSERTION (N/A) as a surgical intervention .  The patient's history has been reviewed, patient examined, no change in status, stable for surgery.  I have reviewed the patient's chart and labs.  Questions were answered to the patient's satisfaction.     Thompson Grayer

## 2017-07-16 ENCOUNTER — Ambulatory Visit (INDEPENDENT_AMBULATORY_CARE_PROVIDER_SITE_OTHER): Payer: Self-pay | Admitting: *Deleted

## 2017-07-16 DIAGNOSIS — I480 Paroxysmal atrial fibrillation: Secondary | ICD-10-CM

## 2017-07-16 DIAGNOSIS — R002 Palpitations: Secondary | ICD-10-CM

## 2017-07-16 DIAGNOSIS — R55 Syncope and collapse: Secondary | ICD-10-CM

## 2017-07-16 LAB — CUP PACEART INCLINIC DEVICE CHECK
Date Time Interrogation Session: 20190326113630
MDC IDC PG IMPLANT DT: 20190315

## 2017-07-16 NOTE — Progress Notes (Signed)
LINQ wound check in clinic s/p implant for AF management, syncope, palpitations. Implant indication reprogrammed to "syncope". Battery- good. R waves. 0.35mV. 20 AF episodes (2.2% burden)- known PAF, on Eliquis. Lindsay Clark reports 2 instances of head "pressure", fatigue, and feeling presyncopal. These have happened without correlation to activity, usually in the evening. Patient educated about wound care, Carelink monitoring and symptom activator. ROV with Dr. Rayann Heman 10/21/17.

## 2017-07-18 ENCOUNTER — Ambulatory Visit: Payer: Medicare Other

## 2017-07-18 ENCOUNTER — Encounter: Payer: Self-pay | Admitting: Physical Therapy

## 2017-07-18 ENCOUNTER — Ambulatory Visit: Payer: Medicare Other | Attending: Neurosurgery | Admitting: Physical Therapy

## 2017-07-18 ENCOUNTER — Other Ambulatory Visit: Payer: Self-pay

## 2017-07-18 DIAGNOSIS — R293 Abnormal posture: Secondary | ICD-10-CM | POA: Diagnosis present

## 2017-07-18 DIAGNOSIS — R262 Difficulty in walking, not elsewhere classified: Secondary | ICD-10-CM | POA: Diagnosis present

## 2017-07-18 DIAGNOSIS — M6281 Muscle weakness (generalized): Secondary | ICD-10-CM

## 2017-07-18 NOTE — Therapy (Signed)
[facility] [address] [place], [place], [address] Phone: [phone]   Fax:  [phone]  Physical Therapy Evaluation  Patient Details  Name: Lindsay Clark MRN: [id number] Date of Birth: 07-13-1940 Referring Provider: Erline Levine, MD   Encounter Date: 07/18/2017  PT End of Session - 07/18/17 1031    Visit Number  1    Date for PT Re-Evaluation  09/12/17    PT Start Time  1021    PT Stop Time  1106    PT Time Calculation (min)  45 min    Activity Tolerance  Patient tolerated treatment well    Behavior During Therapy  [facility] for tasks assessed/performed       Past Medical History:  Diagnosis Date  . Atherosclerosis    mild cerebral  . Autoimmune disorder ([place])   . Cervical disc disease    with spondylosis  . Cervical spondylosis   . Chronic dermatitis    eczematous  . Colitis   . DDD (degenerative disc disease), lumbar   . Frozen shoulder    left  . Hyperlipidemia   . Hypertension   . Lateral epicondylitis   . Lumbar spondylosis   . Movement disorder   . Multiple lesions on CT of brain and spine    reports lesions from previous CT scan at [facility]   . 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 CATARACT EXTRACTION    . COLONOSCOPY    . EYE SURGERY Left   . LOOP RECORDER INSERTION N/A 07/05/2017   Procedure: LOOP RECORDER INSERTION;  Surgeon: Thompson Grayer, MD;  Location: [place] [facility];  Service: Cardiovascular;  Laterality: N/A;  . TUBAL LIGATION  1980    There were no vitals filed for this visit.   Subjective Assessment - 07/18/17 1026    Subjective  Pt states she has noticed leg pain in the last year.  She reports 3.5 years ago, a neurological issue began.  She was finally diagnosed with autoimmune encephalitis, this has caused reduced muscle activity on the right side.  Pt states she will also have severe pain in right heel which is unrelated  but unsure why.    Pertinent History  autoimmune disease, a-fib    Limitations  Walking    How long can you walk comfortably?  9/10 of a mile walk routine (unable to do currently); usually walk 5000 step/day    Diagnostic tests  MRI, PET, CT scan    Patient Stated Goals  get rid of the pain so she can walk    Currently in Pain?  Yes    Pain Location  Hip    Pain Orientation  Right    Pain Type  Chronic pain    Pain Onset  More than a month ago    Pain Frequency  Intermittent    Aggravating Factors   sporadic but when walking    Pain Relieving Factors  nothing    Effect of Pain on Daily Activities  unable to walk as much as I would like    Multiple Pain Sites  No         [facility] PT Assessment - 07/18/17 0001      Assessment   Medical Diagnosis  M54.9 (ICD-10-CM) - Dorsalgia, unspecified    Referring Provider  Erline Levine, MD    Onset Date/Surgical Date  -- appx 1 year  Hand Dominance  Right    Prior Therapy  Yes      Precautions   Precautions  None      Restrictions   Weight Bearing Restrictions  No      Balance Screen   Has the patient fallen in the past 6 months  No      [place] residence    Living Arrangements  Spouse/significant other      Prior Function   Level of [place]  Retired      Associate Professor   Overall Cognitive Status  Within Functional Limits for tasks assessed      Observation/Other Assessments   Focus on Therapeutic Outcomes (FOTO)   54% limited      Posture/Postural Control   Posture/Postural Control  Postural limitations    Postural Limitations  Right pelvic obliquity;Weight shift left;Rounded Shoulders      ROM / Strength   AROM / PROM / Strength  Strength      Strength   Strength Assessment Site  Hip;Knee;Ankle    Right/Left Hip  Right;Left    Right Hip Flexion  4-/5    Right Hip External Rotation   4-/5    Right Hip Internal Rotation  4/5    Right Hip ABduction  3/5     Right Hip ADduction  3+/5    Left Hip Flexion  4/5    Left Hip External Rotation  5/5    Left Hip Internal Rotation  5/5    Left Hip ABduction  4/5    Left Hip ADduction  4+/5    Right/Left Knee  Right;Left    Right Knee Flexion  4+/5    Left Knee Flexion  5/5    Right/Left Ankle  Right;Left    Right Ankle Dorsiflexion  3+/5    Left Ankle Dorsiflexion  4/5      Flexibility   Soft Tissue Assessment /Muscle Length  -- hamstirngs tight left>right      Palpation   SI assessment   right anterior rotation    Palpation comment  tender to palpation right GT bursa      Transfers   Five time sit to stand comments   15 sec      Ambulation/Gait   Gait Pattern  Decreased stride length;Lateral trunk lean to left occasional scissoring      Standardized Balance Assessment   Standardized Balance Assessment  Five Times Sit to Stand              No data recorded  Objective measurements completed on examination: See above findings.                PT Short Term Goals - 07/18/17 1327      PT SHORT TERM GOAL #1   Title  pt will be ind with initial HEP    Baseline  not issued    Time  4    Period  Weeks    Status  New    Target Date  08/15/17      PT SHORT TERM GOAL #2   Title  pt will demonstrate improved hip strength MMT of right hip external rotation and abduction > or = to 4/5 for improved gait and posture    Time  4    Period  Weeks    Status  New    Target Date  08/15/17      PT SHORT  TERM GOAL #3   Title  ...    Baseline  ...      PT SHORT TERM GOAL #4   Title  ...    Baseline  ...        PT Long Term Goals - 07/18/17 1330      PT LONG TERM GOAL #1   Title  Pt will be able to do 1 mile walk without increased hip pain due to improved strength and gait techniques    Baseline  ...    Time  8    Period  Weeks    Status  New    Target Date  09/12/17      PT LONG TERM GOAL #2   Title  ind with advanced HEP    Baseline  ...    Time  8     Period  Weeks    Status  New    Target Date  09/12/17      PT LONG TERM GOAL #3   Title  FOTO < or = to 47% limited    Baseline  54% limited    Time  8    Period  Weeks    Status  New    Target Date  09/12/17      PT LONG TERM GOAL #4   Title  5 x sit to stand < or = to 12 sec in order to place patient within age related norms.    Baseline  15 seconds    Time  8    Period  Weeks    Status  New    Target Date  09/12/17      PT LONG TERM GOAL #5   Title  pt will be able to walk up and down 4 steps reciprocally without increased pain due to improved LE strength    Baseline  ...    Time  8    Period  Weeks    Status  New    Target Date  09/12/17             Plan - 07/18/17 1309    Clinical Impression Statement  Pt presents to clinic due to pain in right hip.  She has received imaging reports that shows severe stenosis throughout lumbar spine, anterolisthesis L4-5. 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  Pt demonstrates postural deficits in sitting and standing weight shifitng to left and keeps right LE externally rotated.  Pt has bilateral LE weakness Rt>Lt of hip, ankle, and knee.  She has tenderness to greater trochanteric bursa of right hip.  Pt demonstrates 5 x sit to stand befow age related norms demonstrating increased risk for falls.  Pt also demonstrates some intermittent gait instability and gait abnormalities as listed above.  Pt will benefit from skilled PT to adress impairments and return to more active lifestlyle with improved pain management.    History and Personal Factors relevant to plan of care:  neurologic autoimmune disease, a-fib    Clinical Presentation  Evolving    Clinical Presentation due to:  Pt has had worsening hip pain    Rehab Potential  Good    PT Frequency  2x / week    PT Duration  8 weeks    PT Treatment/Interventions  ADLs/Self Care Home  Management;Biofeedback;Cryotherapy;Electrical Stimulation;Moist Heat;Ultrasound;Therapeutic activities;Therapeutic exercise;Neuromuscular re-education;Patient/family education;Manual techniques;Passive range of motion;Balance training;Stair training;Gait training;Dry needling;Iontophoresis 4mg /ml Dexamethasone;Taping    PT Next Visit  Plan  give ionto information and patch if order signed, core and hip strengthening, balance    Consulted and Agree with Plan of Care  Patient       Patient will benefit from skilled therapeutic intervention in order to improve the following deficits and impairments:  Abnormal gait, Impaired tone, Pain, Decreased strength, Difficulty walking, Impaired flexibility, Postural dysfunction  Visit Diagnosis: Muscle weakness (generalized) - Plan: PT plan of care cert/re-cert  Difficulty in walking, not elsewhere classified - Plan: PT plan of care cert/re-cert  Abnormal posture - Plan: PT plan of care cert/re-cert     Problem List Patient Active Problem List   Diagnosis Date Noted  . Brain lesion 06/24/2017  . Osteoarthritis   . Multiple lesions on CT of brain and spine   . Movement disorder   . Lumbar spondylosis   . Lateral epicondylitis   . Hyperlipidemia   . Frozen shoulder   . DDD (degenerative disc disease), lumbar   . Colitis   . Dermatitis   . Cervical spondylosis   . Cervical disc disease   . Diastolic dysfunction 45/06/8880  . Syncope 04/18/2016  . Fall 04/18/2016  . Anxiety 04/18/2016  . Hypokalemia 04/18/2016  . Hypertension   . Autoimmune disorder-autoimmune encephalitis   . Dystonia 03/16/2015  . Coronary artery calcification of native artery 11/04/2014  . Abnormal finding on MRI of brain 10/04/2014  . Pruritus 11/30/2011  . 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, [place], [place], [address] Phone:  [phone]   Fax:  [phone]  Name: Lindsay Clark MRN: [id number] Date of Birth: 1941/03/14

## 2017-07-19 ENCOUNTER — Telehealth: Payer: Self-pay | Admitting: Internal Medicine

## 2017-07-19 NOTE — Telephone Encounter (Signed)
Spoke with pt and she states that Tylenol is not working for her muscle aches from her therapy.  Pt states she use to take Aleve but can't not d/t her blood thinner.  Advised Tylenol is usually what we recommend.  Advised I could send message to Dr. Rayann Heman to see what else he felt maybe safe but if it was a prescription pain medication it would need to be filled by her PCP.  Also advised Dr. Rayann Heman out of the office so it would likely be next week before we received a reply.  Pt became upset and asked that message be sent to Dr. [name] since he put her on the Eliquis and see what he recommended because she needed an answer today.  Advised I would send message to Dr. [name] for review.

## 2017-07-19 NOTE — Telephone Encounter (Signed)
Spoke with pt and made her of Dr. Jacalyn Lefevre recommendations.  Pt verbalized understanding and was appreciative for call.

## 2017-07-19 NOTE — Telephone Encounter (Signed)
New Message   Pt c/o medication issue:  1. Name of Medication: Tylenol   2. How are you currently taking this medication (dosage and times per day)?   3. Are you having a reaction (difficulty breathing--STAT)? no  4. What is your medication issue? Pt states she is having some muscle soreness and that tylenol is not working so wants to know what else she can take with her afib

## 2017-07-19 NOTE — Telephone Encounter (Signed)
Would continue tylenol as needed; can take aleve on a rare basis if absolutely necessary Kirk Ruths

## 2017-07-23 ENCOUNTER — Other Ambulatory Visit: Payer: Self-pay | Admitting: Cardiology

## 2017-07-23 ENCOUNTER — Ambulatory Visit: Payer: Medicare Other | Attending: Neurosurgery | Admitting: Physical Therapy

## 2017-07-23 ENCOUNTER — Encounter: Payer: Self-pay | Admitting: Physical Therapy

## 2017-07-23 DIAGNOSIS — M6281 Muscle weakness (generalized): Secondary | ICD-10-CM

## 2017-07-23 DIAGNOSIS — R262 Difficulty in walking, not elsewhere classified: Secondary | ICD-10-CM

## 2017-07-23 DIAGNOSIS — R293 Abnormal posture: Secondary | ICD-10-CM | POA: Diagnosis present

## 2017-07-23 NOTE — Therapy (Addendum)
[facility] [address] [place], [place], [address] Phone: [phone]   Fax:  [phone]  Physical Therapy Treatment/Discharge  Patient Details  Name: Lindsay Clark MRN: [id number] Date of Birth: 1940-12-29 Referring Provider: Erline Levine, MD   Encounter Date: 07/23/2017  PT End of Session - 07/23/17 1018    Visit Number  2    Date for PT Re-Evaluation  09/12/17    PT Start Time  0932    PT Stop Time  1012    PT Time Calculation (min)  40 min    Activity Tolerance  Patient tolerated treatment well    Behavior During Therapy  [facility] for tasks assessed/performed       Past Medical History:  Diagnosis Date  . Atherosclerosis    mild cerebral  . Autoimmune disorder ([place])   . Cervical disc disease    with spondylosis  . Cervical spondylosis   . Chronic dermatitis    eczematous  . Colitis   . DDD (degenerative disc disease), lumbar   . Frozen shoulder    left  . Hyperlipidemia   . Hypertension   . Lateral epicondylitis   . Lumbar spondylosis   . Movement disorder   . Multiple lesions on CT of brain and spine    reports lesions from previous CT scan at [facility]   . 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 CATARACT EXTRACTION    . COLONOSCOPY    . EYE SURGERY Left   . LOOP RECORDER INSERTION N/A 07/05/2017   Procedure: LOOP RECORDER INSERTION;  Surgeon: Thompson Grayer, MD;  Location: [place] [facility];  Service: Cardiovascular;  Laterality: N/A;  . TUBAL LIGATION  1980    There were no vitals filed for this visit.  Subjective Assessment - 07/23/17 0935    Subjective  brought bag of exercise equipment today to look at and create a program.  also has ankle weights but didn't bring    Patient Stated Goals  get rid of the pain so she can walk    Currently in Pain?  No/denies                       [facility] Adult PT Treatment/Exercise - 07/23/17  0936      Exercises   Exercises  Knee/Hip;Lumbar      Lumbar Exercises: Supine   Pelvic Tilt  10 reps;5 seconds      Knee/Hip Exercises: Supine   Hip Adduction Isometric  Both;15 reps 5 sec    Bridges  Both;15 reps    Bridges Limitations  with resistance band for stabilization; mod cues for technique    Other Supine Knee/Hip Exercises  single limb hip abduction x 15 bil with home red resistance loop             PT Education - 07/23/17 1018    Education provided  Yes    Education Details  HEP    Person(s) Educated  Patient    Methods  Explanation;Demonstration;Handout    Comprehension  Verbalized understanding;Returned demonstration;Need further instruction       PT Short Term Goals - 07/18/17 1327      PT SHORT TERM GOAL #1   Title  pt will be ind with initial HEP    Baseline  not issued    Time  4    Period  Weeks  Status  New    Target Date  08/15/17      PT SHORT TERM GOAL #2   Title  pt will demonstrate improved hip strength MMT of right hip external rotation and abduction > or = to 4/5 for improved gait and posture    Time  4    Period  Weeks    Status  New    Target Date  08/15/17      PT SHORT TERM GOAL #3   Title  ...    Baseline  ...      PT SHORT TERM GOAL #4   Title  ...    Baseline  ...        PT Long Term Goals - 07/18/17 1330      PT LONG TERM GOAL #1   Title  Pt will be able to do 1 mile walk without increased hip pain due to improved strength and gait techniques    Baseline  ...    Time  8    Period  Weeks    Status  New    Target Date  09/12/17      PT LONG TERM GOAL #2   Title  ind with advanced HEP    Baseline  ...    Time  8    Period  Weeks    Status  New    Target Date  09/12/17      PT LONG TERM GOAL #3   Title  FOTO < or = to 47% limited    Baseline  54% limited    Time  8    Period  Weeks    Status  New    Target Date  09/12/17      PT LONG TERM GOAL #4   Title  5 x sit to stand < or = to 12 sec in order  to place patient within age related norms.    Baseline  15 seconds    Time  8    Period  Weeks    Status  New    Target Date  09/12/17      PT LONG TERM GOAL #5   Title  pt will be able to walk up and down 4 steps reciprocally without increased pain due to improved LE strength    Baseline  ...    Time  8    Period  Weeks    Status  New    Target Date  09/12/17            Plan - 07/23/17 1018    Clinical Impression Statement  Pt tolerated session well today initiating HEP for hip and core strengthening.  No c/o pain with all exercises, and min cues needed for technique.  No goals met as only 2nd visit, but progressing well.      Rehab Potential  Good    PT Frequency  2x / week    PT Duration  8 weeks    PT Treatment/Interventions  ADLs/Self Care Home Management;Biofeedback;Cryotherapy;Electrical Stimulation;Moist Heat;Ultrasound;Therapeutic activities;Therapeutic exercise;Neuromuscular re-education;Patient/family education;Manual techniques;Passive range of motion;Balance training;Stair training;Gait training;Dry needling;Iontophoresis 41m/ml Dexamethasone;Taping    PT Next Visit Plan  give ionto information and patch if order signed, core and hip strengthening, balance    Consulted and Agree with Plan of Care  Patient       Patient will benefit from skilled therapeutic intervention in order to improve the following deficits and impairments:  Abnormal gait, Impaired tone, Pain, Decreased  strength, Difficulty walking, Impaired flexibility, Postural dysfunction  Visit Diagnosis: Muscle weakness (generalized)  Difficulty in walking, not elsewhere classified  Abnormal posture     Problem List Patient Active Problem List   Diagnosis Date Noted  . Brain lesion 06/24/2017  . Osteoarthritis   . Multiple lesions on CT of brain and spine   . Movement disorder   . Lumbar spondylosis   . Lateral epicondylitis   . Hyperlipidemia   . Frozen shoulder   . DDD (degenerative disc  disease), lumbar   . Colitis   . Dermatitis   . Cervical spondylosis   . Cervical disc disease   . Diastolic dysfunction 70/07/8496  . Syncope 04/18/2016  . Fall 04/18/2016  . Anxiety 04/18/2016  . Hypokalemia 04/18/2016  . Hypertension   . Autoimmune disorder-autoimmune encephalitis   . Dystonia 03/16/2015  . Coronary artery calcification of native artery 11/04/2014  . Abnormal finding on MRI of brain 10/04/2014  . Pruritus 11/30/2011  . 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 [place], [place], [address] Phone: [phone]   Fax:  [phone]  Name: DARI CARPENITO MRN: [id number] Date of Birth: 10-Feb-1941       PHYSICAL THERAPY DISCHARGE SUMMARY  Visits from Start of Care: 2  Current functional level related to goals / functional outcomes: See above   Remaining deficits: See above   Education / Equipment: HEP  Plan: Patient agrees to discharge.  Patient goals were not met. Patient is being discharged due to the patient's request.  ?????    Laureen Abrahams, PT, DPT 08/01/17 3:52 PM  [facility] [place], [place] [address]  [phone] (office) [phone] (fax)

## 2017-07-23 NOTE — Patient Instructions (Signed)
Access Code: [email or web address]  URL: [email or web address]  Date: 07/23/2017  Prepared by: Faustino Congress   Exercises  Supine Hip Adduction Isometric with Ball - 15 reps - 1 sets - 5 sec hold - 2x daily - 7x weekly  Hooklying Isometric Clamshell - 15 reps - 1 sets - 2x daily - 7x weekly  Bridge with Resistance - 10-15 reps - 1 sets - 5 sec hold - 2x daily - 7x weekly  Supine Posterior Pelvic Tilt - 10 reps - 1 sets - 5 sec hold - 2x daily - 7x weekly

## 2017-07-25 ENCOUNTER — Encounter: Payer: Medicare Other | Admitting: Physical Therapy

## 2017-07-30 ENCOUNTER — Encounter: Payer: Medicare Other | Admitting: Physical Therapy

## 2017-08-01 ENCOUNTER — Encounter: Payer: Medicare Other | Admitting: Physical Therapy

## 2017-08-05 ENCOUNTER — Encounter: Payer: Medicare Other | Admitting: Physical Therapy

## 2017-08-05 ENCOUNTER — Ambulatory Visit (INDEPENDENT_AMBULATORY_CARE_PROVIDER_SITE_OTHER): Payer: Medicare Other | Admitting: *Deleted

## 2017-08-05 DIAGNOSIS — R55 Syncope and collapse: Secondary | ICD-10-CM | POA: Diagnosis not present

## 2017-08-05 NOTE — Progress Notes (Signed)
Carelink Summary Report / Loop Recorder 

## 2017-08-06 ENCOUNTER — Encounter: Payer: Self-pay | Admitting: Internal Medicine

## 2017-08-08 ENCOUNTER — Encounter: Payer: Medicare Other | Admitting: Physical Therapy

## 2017-08-12 ENCOUNTER — Ambulatory Visit: Payer: Medicare Other | Admitting: Physical Therapy

## 2017-08-15 ENCOUNTER — Encounter: Payer: Medicare Other | Admitting: Physical Therapy

## 2017-08-19 ENCOUNTER — Ambulatory Visit: Payer: Medicare Other | Admitting: Physical Therapy

## 2017-08-22 ENCOUNTER — Encounter: Payer: Medicare Other | Admitting: Physical Therapy

## 2017-08-26 ENCOUNTER — Telehealth: Payer: Self-pay | Admitting: Cardiology

## 2017-08-26 NOTE — Telephone Encounter (Signed)
Left message to call back  

## 2017-08-26 NOTE — Telephone Encounter (Signed)
New Message:       Pt c/o medication issue:  1. Name of Medication: metoprolol succinate (TOPROL XL) 25 MG 24 hr tablet  2. How are you currently taking this medication (dosage and times per day)? Take 2 tablets in the morning and 1 tablet in the evening  3. Are you having a reaction (difficulty breathing--STAT)? No  4. What is your medication issue? Medication is causing swelling in the legs and feet and some in her fingers.     Pt will not be home b/w the hours of 11:45-12:45

## 2017-08-26 NOTE — Telephone Encounter (Signed)
Follow up ° ° °Patient returning call to nurse °

## 2017-08-26 NOTE — Telephone Encounter (Signed)
Returned the call to the patient. She stated that since the increase in her Metoprolol to 50 mg in the morning and 25 mg in the evening that she has been having trouble with lower extremity edema. She stated that she has tried to decrease her fluids and that has not helped and she has tried to increase her fluids which did not help as well. She attributes the edema to her Metoprolol and would like to get a substitute for it. She stated that she would also like for Dr. [name] to know that her PCP referred her to a vein specialist and there was "nothing noted from the groin down" that was abnormal. Message routed to the provider.

## 2017-08-27 ENCOUNTER — Telehealth: Payer: Self-pay | Admitting: *Deleted

## 2017-08-27 MED ORDER — DILTIAZEM HCL ER COATED BEADS 120 MG PO CP24: 120.0000 mg | ORAL_CAPSULE | Freq: Every day | ORAL | 11 refills | Status: DC

## 2017-08-27 NOTE — Telephone Encounter (Signed)
Spoke with pt, Aware of dr crenshaw's recommendations.  °

## 2017-08-27 NOTE — Telephone Encounter (Signed)
Left message for pt to call, dr [name] would like the patient to try cardizem cd 120 mg once daily to replace the metoprolol. Need to confirm pharmacy.

## 2017-08-27 NOTE — Telephone Encounter (Signed)
-----   Message from Lelon Perla, MD sent at 08/27/2017  4:22 PM EDT ----- Please have pt wean metoprolol prior to [place] (25 BID for 2 days and then DC). Begin cardizem the day after toprol DCed. Kirk Ruths

## 2017-08-27 NOTE — Telephone Encounter (Signed)
New Message:      Pt is returning call. Pt states she is on her way to the office due to the back and forth of these calls

## 2017-08-27 NOTE — Telephone Encounter (Signed)
Patient came in as a walk in. She has verbalized her agreement to start the Cardizem CD 120 mg once daily to replace the Metoprolol. She will check her blood pressure and heart rate and call back with any problems. Medication has been sent to [facility] per her request.

## 2017-08-27 NOTE — Telephone Encounter (Signed)
Metoprolol not typically associated with lower ext edema; keep feet elevated; may try compression hose Lindsay Clark

## 2017-09-02 ENCOUNTER — Telehealth: Payer: Self-pay | Admitting: Cardiology

## 2017-09-02 NOTE — Telephone Encounter (Signed)
New Message:   Please call,concerning  Her new medicine(Diltiazem).She started on this medicine on Saturday,she really wants to see somebody.She says the medicine is not keeping her heart rate down.

## 2017-09-02 NOTE — Telephone Encounter (Signed)
LMTCB//sss 

## 2017-09-02 NOTE — Telephone Encounter (Signed)
Patient called and wanted to know if we could override the scheduled remote appt. I attempted to explain to the patient that [employer] automatically populates those dates and I am unable to override it. Pt still wants someone to call her to discuss the results and when she wants them. Informed pt that I would have a Device Tech RN call her back. Pt verbalized understanding.

## 2017-09-02 NOTE — Telephone Encounter (Signed)
Left a message to call back.

## 2017-09-03 MED ORDER — DILTIAZEM HCL ER COATED BEADS 240 MG PO CP24: 240.0000 mg | ORAL_CAPSULE | Freq: Every day | ORAL | 3 refills | Status: DC

## 2017-09-03 NOTE — Telephone Encounter (Signed)
Spoke with pt, Aware of dr crenshaw's recommendations.  °

## 2017-09-03 NOTE — Telephone Encounter (Signed)
Left message to call back  

## 2017-09-03 NOTE — Telephone Encounter (Signed)
Pt states she doesn't feel the Cardizem is working for her. She states her HR has increased since she started the medication on Sunday. Pt reports her HR has been in the high 90's has gotten up to 160 at rest. She also report her BP yesterday was 145/120.  Pt state she is flying out of town on Sunday and would like further recommendation before she leaves. Routed to Dr. [name]

## 2017-09-03 NOTE — Telephone Encounter (Signed)
Patient returned my call. Patient requests that someone call her with her ILR results on Friday. She states that she will be going out of town for 10 days and wants to know how her heart is doing. I told patient that she should call back on Friday to get her results. Patient verbalized understanding.  Patient also said that she had an episode yesterday where her heart rate was elevated for >/= 21mins. She states that she was playing bridge when all of a sudden she experienced head pressure, palpitations, and weakness. She said that her medications were adjusted last week per her request. She said that the medication has helped her LEE, but she's unsure if it's helped her HR. I told patient that the episodes could be reviewed with Dr.Allred tomorrow, and we would call if anything further is recommended. Patient verbalized understanding.  Will forward information to Levander Campion, RN.

## 2017-09-03 NOTE — Telephone Encounter (Signed)
Increase cardizem to 240 mg daily Kirk Ruths, MD

## 2017-09-03 NOTE — Telephone Encounter (Signed)
Follow up   Pt returning call for [facility]

## 2017-09-04 LAB — CUP PACEART REMOTE DEVICE CHECK
Implantable Pulse Generator Implant Date: 20190315
MDC IDC SESS DTM: 20190414113817

## 2017-09-04 NOTE — Telephone Encounter (Signed)
Spoke with patient's husband, Lindsay Clark (DPR).  Advised that Dr. Rayann Heman reviewed the tachy and AF episodes from 5/13, aware that Dr. [name] started and subsequently increased diltiazem, recommended no additional changes at this time.  Plan to continue monitoring remotely via Carelink monitor until next f/u with Dr. Rayann Heman on 10/21/17.  Patient's husband verbalizes understanding and will relay message to patient.

## 2017-09-05 ENCOUNTER — Other Ambulatory Visit: Payer: Self-pay | Admitting: Internal Medicine

## 2017-09-06 ENCOUNTER — Telehealth: Payer: Self-pay | Admitting: *Deleted

## 2017-09-06 ENCOUNTER — Ambulatory Visit (INDEPENDENT_AMBULATORY_CARE_PROVIDER_SITE_OTHER): Payer: Medicare Other | Admitting: *Deleted

## 2017-09-06 DIAGNOSIS — R55 Syncope and collapse: Secondary | ICD-10-CM | POA: Diagnosis not present

## 2017-09-06 NOTE — Telephone Encounter (Signed)
Ms. Lindsay Clark calling to get the results of her Carelink Summary Report for her loop recorder. Tachy and AF episodes- previously reviewed and managed.   Lindsay Clark c/o elevated heart rates throughout the day even since she's had her cardizem dose increased per Dr. [name]. Lindsay Clark is taking 120mg  of Cardizem CD twice daily. Per Dr. Jacalyn Lefevre instruction she should be taking 240mg  once daily. I have advised her to take 2 capsules of her current Rx to make a 240mg  daily dose until she needs her new Rx for the 240mg  capules. She will monitor her HR after this change. She is planning to bring her home monitor with her during her upcoming 10 day trip.

## 2017-09-06 NOTE — Progress Notes (Signed)
Carelink Summary Report / Loop Recorder 

## 2017-09-23 ENCOUNTER — Telehealth: Payer: Self-pay | Admitting: Cardiology

## 2017-09-23 DIAGNOSIS — I5032 Chronic diastolic (congestive) heart failure: Secondary | ICD-10-CM

## 2017-09-23 NOTE — Telephone Encounter (Signed)
Pt c/o swelling: STAT is pt has developed SOB within 24 hours  1) How much weight have you gained and in what time span?   2) If swelling, where is the swelling located? Feet,ankles and up the last  3) Are you currently taking a fluid pill? Diltiazem  4) Are you currently SOB? no 5) Do you have a log of your daily weights (if so, list)? yes  6) Have you gained 3 pounds in a day or 5 pounds in a week?  Gain it and lose  7) Have you traveled recently?no

## 2017-09-23 NOTE — Telephone Encounter (Signed)
Spoke with pt, she is still having swelling in her feet and legs that she feels is related to diltiazem. There has been no change switching from metoprolol to diltiazem. The edema is some better in the mornings but is never back to normal. Will forward for dr [name] review

## 2017-09-24 NOTE — Telephone Encounter (Signed)
Left message to call back  

## 2017-09-24 NOTE — Telephone Encounter (Signed)
Pt was having edema prior to cardizem when she was taking metoprolol. Has she kept feet elevated and used compression hose? Can also increase lasix to 40 mg daily with bmet 1 week Kirk Ruths

## 2017-09-25 ENCOUNTER — Encounter: Payer: Self-pay | Admitting: Cardiology

## 2017-09-25 MED ORDER — FUROSEMIDE 20 MG PO TABS: 40.0000 mg | ORAL_TABLET | Freq: Every day | ORAL | Status: DC

## 2017-09-25 NOTE — Telephone Encounter (Signed)
Spoke with pt, Aware of dr crenshaw's recommendations.  Lab orders mailed to the pt  

## 2017-10-01 LAB — CUP PACEART REMOTE DEVICE CHECK
Implantable Pulse Generator Implant Date: 20190315
MDC IDC SESS DTM: 20190517121136

## 2017-10-03 ENCOUNTER — Telehealth: Payer: Self-pay | Admitting: *Deleted

## 2017-10-03 LAB — BASIC METABOLIC PANEL
BUN / CREAT RATIO: 25 (ref 12–28)
BUN: 16 mg/dL (ref 8–27)
CO2: 30 mmol/L — ABNORMAL HIGH (ref 20–29)
Calcium: 9.7 mg/dL (ref 8.7–10.3)
Chloride: 94 mmol/L — ABNORMAL LOW (ref 96–106)
Creatinine, Ser: 0.63 mg/dL (ref 0.57–1.00)
GFR, EST AFRICAN AMERICAN: 102 mL/min/{1.73_m2} (ref >59)
GFR, EST NON AFRICAN AMERICAN: 89 mL/min/{1.73_m2} (ref >59)
Glucose: 129 mg/dL — ABNORMAL HIGH (ref 65–99)
POTASSIUM: 2.8 mmol/L — AB (ref 3.5–5.2)
SODIUM: 143 mmol/L (ref 134–144)

## 2017-10-03 MED ORDER — POTASSIUM CHLORIDE ER 20 MEQ PO TBCR: EXTENDED_RELEASE_TABLET | ORAL | 6 refills | Status: DC

## 2017-10-03 NOTE — Telephone Encounter (Signed)
Spoke with pt, per dr harding, due to low potassium on labs pt instructed to start potassium chloride 40 meq twice daily x 2 days, then she will decrease to once daily. She will hold furosemide for 2 days. She will come to the office Monday for lab work. She report the swelling is better but not completely gone, she reports the diltiazem is controlling her heart rate better. She may try going back to the 20 mg of furosemide depending on the swelling after 2 days off of it. Will forward for dr [name] review

## 2017-10-04 NOTE — Telephone Encounter (Signed)
Spoke with pt, Aware of dr Jacalyn Lefevre recommendations. Follow up scheduled per patient request.

## 2017-10-04 NOTE — Telephone Encounter (Signed)
See note on lab results Lindsay Clark

## 2017-10-07 ENCOUNTER — Other Ambulatory Visit: Payer: Self-pay | Admitting: *Deleted

## 2017-10-07 DIAGNOSIS — E876 Hypokalemia: Secondary | ICD-10-CM

## 2017-10-08 LAB — BASIC METABOLIC PANEL
BUN/Creatinine Ratio: 27 (ref 12–28)
BUN: 15 mg/dL (ref 8–27)
CALCIUM: 8.8 mg/dL (ref 8.7–10.3)
CHLORIDE: 102 mmol/L (ref 96–106)
CO2: 29 mmol/L (ref 20–29)
Creatinine, Ser: 0.55 mg/dL — ABNORMAL LOW (ref 0.57–1.00)
GFR calc Af Amer: 106 mL/min/{1.73_m2} (ref >59)
GFR calc non Af Amer: 92 mL/min/{1.73_m2} (ref >59)
Glucose: 102 mg/dL — ABNORMAL HIGH (ref 65–99)
POTASSIUM: 4.1 mmol/L (ref 3.5–5.2)
SODIUM: 145 mmol/L — AB (ref 134–144)

## 2017-10-08 LAB — CBC
Hematocrit: 39.4 % (ref 34.0–46.6)
Hemoglobin: 13.2 g/dL (ref 11.1–15.9)
MCH: 29.9 pg (ref 26.6–33.0)
MCHC: 33.5 g/dL (ref 31.5–35.7)
MCV: 89 fL (ref 79–97)
PLATELETS: 227 10*3/uL (ref 150–450)
RBC: 4.42 x10E6/uL (ref 3.77–5.28)
RDW: 13.3 % (ref 12.3–15.4)
WBC: 4.4 10*3/uL (ref 3.4–10.8)

## 2017-10-09 ENCOUNTER — Ambulatory Visit (INDEPENDENT_AMBULATORY_CARE_PROVIDER_SITE_OTHER): Payer: Medicare Other | Admitting: *Deleted

## 2017-10-09 DIAGNOSIS — R55 Syncope and collapse: Secondary | ICD-10-CM

## 2017-10-09 NOTE — Progress Notes (Signed)
HPI: FUPAF. 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 Patient seen in September2017with complaints of palpitations and near syncope. Patient was scheduled for an exercise treadmill and when she came she was noted to have runs of paroxysmal atrial tachycardia/fibrillation. Procedure was canceled.Monitor October 2018 showed paroxysmal atririllation and flutter. I recommended increasing Toprol.Nuclear study October 2018 showed ejection fraction 67%, apical thinning but no ischemia.Echocardiogram October 2018 showed normal LV systolic function and mild diastolic dysfunction.Placed on apixaban.  Patient was seen by Dr. Rayann Heman in March 2019 and ultimately had implantable loop placed for palpitations and syncope.  She has contacted the office recently with complaints of pedal edema which she felt was secondary to metoprolol and this was changed to Cardizem.  Her pedal edema persisted and Lasix was added.  Since last seen,there is no chest pain, dyspnea, palpitations or syncope.  No bleeding.  She has had some improvement in her lower extremity edema with Lasix.  Current Outpatient Medications  Medication Sig Dispense Refill  . Clobetasol Propionate 0.05 % shampoo Apply 1 application topically daily as needed (itching).     Marland Kitchen diltiazem (CARDIZEM CD) 240 MG 24 hr capsule Take 1 capsule (240 mg total) by mouth daily. 90 capsule 3  . ELIQUIS 5 MG TABS tablet TAKE 1 TABLET BY MOUTH TWICE DAILY. 180 tablet 1  . furosemide (LASIX) 20 MG tablet Take 2 tablets (40 mg total) by mouth daily. 30 tablet   . hydroxychloroquine (PLAQUENIL) 200 MG tablet Take 200 mg by mouth 2 (two) times daily.    . hydrOXYzine (ATARAX/VISTARIL) 10 MG tablet Take 10 mg by mouth 3 (three) times daily as needed.    . Multiple Vitamins-Minerals (ONE-A-DAY WOMENS 50 PLUS PO) Take 1 tablet by mouth daily.     . Potassium Chloride ER 20 MEQ TBCR Take 2 tablets twice daily x 2 days then decrease to 2 tablets  daily 68 tablet 6  . Propylene Glycol (SYSTANE BALANCE) 0.6 % SOLN Apply 1 drop to eye daily as needed (itching).     No current facility-administered medications for this visit.      Past Medical History:  Diagnosis Date  . Atherosclerosis    mild cerebral  . 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 Cervical disc disease    with spondylosis  . Cervical spondylosis   . Chronic dermatitis    eczematous  . Colitis   . DDD (degenerative disc disease), lumbar   . Frozen shoulder    left  . Hyperlipidemia   . Hypertension   . Lateral epicondylitis   . Lumbar spondylosis   . Movement disorder   . Multiple lesions on CT of brain and spine    reports lesions from [facility] CT scan [facility]   . 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 CATARACT EXTRACTION    . COLONOSCOPY    . EYE SURGERY Left   . LOOP RECORDER INSERTION N/A 07/05/2017   Procedure: LOOP RECORDER INSERTION;  Surgeon: Thompson Grayer, MD;  Location: [place] [facility];  Service: Cardiovascular;  Laterality: N/A;  . TUBAL LIGATION  1980    Social History   Socioeconomic History  . Marital status: Married    Spouse name: [name]  . Number of children: 2  . Years of education: MAx2  . Highest education level: Not on file  Occupational History  . Occupation: Retired    Fish farm manager: [employer]  Social Needs  . Financial resource strain: Not on file  . Food insecurity:    Worry: Not on file    Inability: Not on file  . Transportation needs:    Medical: Not on file    Non-medical: Not on file  Tobacco Use  . Smoking status: Never Smoker  . Smokeless tobacco: Never Used  Substance and Sexual Activity  . Alcohol use: Yes    Alcohol/week: 0.0 oz    Comment: 4 glasses of wine weekly  . Drug use: No  . Sexual activity: Never  Lifestyle  . Physical activity:    Days per week: Not on file    Minutes per session: Not on file  . Stress: Not on file  Relationships  . Social  connections:    Talks on phone: Not on file    Gets together: Not on file    Attends religious service: Not on file    Active member of club or organization: Not on file    Attends meetings of clubs or organizations: Not on file    Relationship status: Not on file  . Intimate partner violence:    Fear of current or ex partner: Not on file    Emotionally abused: Not on file    Physically abused: Not on file    Forced sexual activity: Not on file  Other Topics Concern  . Not on file  Social History Narrative   Patient lives at home with her spouse.   Caffeine Use: 2-3 cups daily    Family History  Problem Relation Age of Onset  . Stroke Mother   . Colon cancer Mother 19  . Heart attack Father 32  . Emphysema Father   . Thyroid disease Son   . Thyroid disease Maternal Grandmother   . Cancer Maternal Grandmother        cervical   . Thrombosis Maternal Grandfather     ROS: no fevers or chills, productive cough, hemoptysis, dysphasia, odynophagia, melena, hematochezia, dysuria, hematuria, rash, seizure activity, orthopnea, PND, pedal edema, claudication. Remaining systems are negative.  Physical Exam: Well-developed well-nourished in no acute distress.  Skin is warm and dry.  HEENT is normal.  Neck is supple.  Chest is clear to auscultation with normal expansion.  Cardiovascular exam is regular rate and rhythm.  Abdominal exam nontender or distended. No masses palpated. Extremities show no edema. neuro grossly intact   A/P  1 paroxysmal atrial fibrillation-patient is in sinus rhythm today.  She felt as though Toprol was causing pedal edema though I think this is unlikely.  She is presently on Cardizem and we will continue.  Continue apixaban at present dose.  2 hypertension-blood pressure is controlled.  Continue present medications.  If she has higher blood pressures in the future we will increase Cardizem further.  3 pedal edema-patient has had continuing complaints of  lower extremity edema though improved with Lasix.  Continue off of HCTZ and continue present dose of Lasix.  Continue present dose of potassium.  We discussed the importance of keeping her feet elevated, fluid restriction and low-sodium diet.  Check potassium and renal function in 2 weeks given recent hypokalemia.  4 history of palpitations/syncope-implantable loop recorder in place.  Continue to monitor.  Kirk Ruths, MD

## 2017-10-09 NOTE — Progress Notes (Signed)
Carelink Summary Report / Loop Recorder 

## 2017-10-10 ENCOUNTER — Ambulatory Visit: Payer: Medicare Other | Admitting: Cardiology

## 2017-10-10 ENCOUNTER — Encounter: Payer: Self-pay | Admitting: Cardiology

## 2017-10-10 VITALS — BP 138/72 | HR 74 | Ht 64.0 in | Wt 204.0 lb

## 2017-10-10 DIAGNOSIS — R609 Edema, unspecified: Secondary | ICD-10-CM | POA: Diagnosis not present

## 2017-10-10 DIAGNOSIS — E876 Hypokalemia: Secondary | ICD-10-CM

## 2017-10-10 DIAGNOSIS — I10 Essential (primary) hypertension: Secondary | ICD-10-CM

## 2017-10-10 DIAGNOSIS — I480 Paroxysmal atrial fibrillation: Secondary | ICD-10-CM | POA: Diagnosis not present

## 2017-10-10 NOTE — Patient Instructions (Signed)
Medication Instructions:   NO CHANGE  Labwork:  Your physician recommends that you return for lab work in: [place]:  Your physician recommends that you schedule a follow-up appointment in: [place]   If you need a refill on your cardiac medications before your next appointment, please call your pharmacy.

## 2017-10-16 ENCOUNTER — Other Ambulatory Visit: Payer: Self-pay

## 2017-10-16 DIAGNOSIS — I5032 Chronic diastolic (congestive) heart failure: Secondary | ICD-10-CM

## 2017-10-16 MED ORDER — FUROSEMIDE 20 MG PO TABS: 20.0000 mg | ORAL_TABLET | Freq: Two times a day (BID) | ORAL | 6 refills | Status: DC

## 2017-10-16 MED ORDER — FUROSEMIDE 20 MG PO TABS: 40.0000 mg | ORAL_TABLET | Freq: Every day | ORAL | 6 refills | Status: DC

## 2017-10-16 NOTE — Telephone Encounter (Signed)
Pt walked in to request on refill of her Lasix. She said driving here will get her here to speak with [place] quicker that calling in on the phone. She said she called her pharmacy but they did not have refilled. Advised pt I would call in RX for her asap.

## 2017-10-21 ENCOUNTER — Encounter: Payer: Self-pay | Admitting: Internal Medicine

## 2017-10-21 ENCOUNTER — Ambulatory Visit: Payer: Medicare Other | Admitting: Internal Medicine

## 2017-10-21 VITALS — BP 124/76 | HR 67 | Ht 64.0 in | Wt 202.0 lb

## 2017-10-21 DIAGNOSIS — I480 Paroxysmal atrial fibrillation: Secondary | ICD-10-CM

## 2017-10-21 DIAGNOSIS — R55 Syncope and collapse: Secondary | ICD-10-CM | POA: Diagnosis not present

## 2017-10-21 DIAGNOSIS — I10 Essential (primary) hypertension: Secondary | ICD-10-CM | POA: Diagnosis not present

## 2017-10-21 LAB — CUP PACEART INCLINIC DEVICE CHECK
Date Time Interrogation Session: 20190701105432
MDC IDC PG IMPLANT DT: 20190315

## 2017-10-21 NOTE — Progress Notes (Signed)
PCP: [name], MD Primary Cardiologist: Dr [name] Primary EP: Dr Rayann Heman  Lindsay Clark is a 77 y.o. female who presents today for routine electrophysiology followup.  Since last being seen in our clinic, the patient reports doing very well. Arrhythmias are well controlled.  She has occasional "heart pounding" with normal heart rates.  Her swelling is better. Today, she denies symptoms of palpitations, chest pain, shortness of breath,  dizziness, presyncope, or syncope.  The patient is otherwise without complaint today.   Past Medical History:  Diagnosis Date  . Atherosclerosis    mild cerebral  . 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 Cervical disc disease    with spondylosis  . Cervical spondylosis   . Chronic dermatitis    eczematous  . Colitis   . DDD (degenerative disc disease), lumbar   . Frozen shoulder    left  . Hyperlipidemia   . Hypertension   . Lateral epicondylitis   . Lumbar spondylosis   . Movement disorder   . Multiple lesions on CT of brain and spine    reports lesions from [facility] CT scan at [facility]   . 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 CATARACT EXTRACTION    . COLONOSCOPY    . EYE SURGERY Left   . LOOP RECORDER INSERTION N/A 07/05/2017   Procedure: LOOP RECORDER INSERTION;  Surgeon: Thompson Grayer, MD;  Location: [place] [facility];  Service: Cardiovascular;  Laterality: N/A;  . TUBAL LIGATION  1980    ROS- all systems are reviewed and negatives except as per HPI above  Current Outpatient Medications  Medication Sig Dispense Refill  . diltiazem (CARDIZEM CD) 240 MG 24 hr capsule Take 1 capsule (240 mg total) by mouth daily. 90 capsule 3  . ELIQUIS 5 MG TABS tablet TAKE 1 TABLET BY MOUTH TWICE DAILY. 180 tablet 1  . furosemide (LASIX) 20 MG tablet Take 1 tablet (20 mg total) by mouth 2 (two) times daily. 60 tablet 6  . hydroxychloroquine (PLAQUENIL) 200 MG tablet Take 200 mg by mouth 2 (two) times  daily.    . hydrOXYzine (ATARAX/VISTARIL) 10 MG tablet Take 10 mg by mouth 3 (three) times daily as needed.    . Multiple Vitamins-Minerals (ONE-A-DAY WOMENS 50 PLUS PO) Take 1 tablet by mouth daily.     . Potassium Chloride ER 20 MEQ TBCR Take 2 tablets twice daily x 2 days then decrease to 2 tablets daily 68 tablet 6   No current facility-administered medications for this visit.     Physical Exam: Vitals:   10/21/17 1013  BP: 124/76  Pulse: 67  Weight: 202 lb (91.6 kg)  Height: 5\' 4"  (1.626 m)    GEN- The patient is well appearing, alert and oriented x 3 today.   Head- normocephalic, atraumatic Eyes-  Sclera clear, conjunctiva pink Ears- hearing intact Oropharynx- clear Lungs- Clear to ausculation bilaterally, normal work of breathing Heart- Regular rate and rhythm, no murmurs, rubs or gallops, PMI not laterally displaced GI- soft, NT, ND, + BS Extremities- no clubbing, cyanosis, or edema  Wt Readings from Last 3 Encounters:  10/21/17 202 lb (91.6 kg)  10/10/17 204 lb (92.5 kg)  07/05/17 199 lb (90.3 kg)    EKG tracing ordered today is personally reviewed and shows sinus rhythm 67 bpm, PACs,   Assessment and Plan:  1. Atrial arrhythmias ILR interrogation today is personally reviewed and reveals 1.3% afib burden.  She does not feel that this warrants AAD therapy. She also has "heart pounding" episodes which are not due to an arrhythmia .  We discussed sodium avoidance, caffine/ stimulant moderation, and ETOH reduction. chads2vasc score is 3.  Continue eliquis  2. Syncope No further episodes  3. Snoring Referred to Dr Maxwell Caul She has not been compliant with my referral  4. HTN Likely cause for "heart pounding" Lifestyle modification discussed as above  5. Obesity Body mass index is 34.67 kg/m. Wt Readings from Last 3 Encounters:  10/21/17 202 lb (91.6 kg)  10/10/17 204 lb (92.5 kg)  07/05/17 199 lb (90.3 kg)   She had multiple logistic complaints about our  office's scheduling and reminder system.  I have informed her that I do not have any control over this and have arranged for our office supervisor to have a conversation today.  Carelink Return as needed Follow-up with Dr [name] as scheduled in december  Thompson Grayer MD, [facility] 10/21/2017 10:22 AM

## 2017-10-21 NOTE — Patient Instructions (Addendum)
Medication Instructions:  Your physician recommends that you continue on your current medications as directed. Please refer to the Current Medication list given to you today.  Labwork: None ordered  Testing/Procedures: None ordered  Follow-Up: Continue with monthly monitoring for your loop recorder.  No follow up is needed at this time with Dr. Rayann Heman.  He will see you on an as needed basis.   * If you need a refill on your cardiac medications before your next appointment, please call your pharmacy.   *Please note that any paperwork needing to be filled out by the provider will need to be addressed at the front desk prior to seeing the provider. Please note that any FMLA, disability or other documents regarding health condition is subject to a $25.00 charge that must be received prior to completion of paperwork in the form of a money order or check.  Thank you for choosing [facility]!!

## 2017-10-23 LAB — BASIC METABOLIC PANEL
BUN/Creatinine Ratio: 27 (ref 12–28)
BUN: 18 mg/dL (ref 8–27)
CO2: 29 mmol/L (ref 20–29)
CREATININE: 0.66 mg/dL (ref 0.57–1.00)
Calcium: 9.3 mg/dL (ref 8.7–10.3)
Chloride: 100 mmol/L (ref 96–106)
GFR calc Af Amer: 100 mL/min/{1.73_m2} (ref >59)
GFR calc non Af Amer: 87 mL/min/{1.73_m2} (ref >59)
Glucose: 77 mg/dL (ref 65–99)
Potassium: 4.3 mmol/L (ref 3.5–5.2)
SODIUM: 142 mmol/L (ref 134–144)

## 2017-10-31 ENCOUNTER — Encounter: Payer: Self-pay | Admitting: Cardiology

## 2017-11-08 ENCOUNTER — Ambulatory Visit (INDEPENDENT_AMBULATORY_CARE_PROVIDER_SITE_OTHER): Payer: Medicare Other | Admitting: *Deleted

## 2017-11-08 DIAGNOSIS — R55 Syncope and collapse: Secondary | ICD-10-CM | POA: Diagnosis not present

## 2017-11-11 ENCOUNTER — Encounter: Payer: Self-pay | Admitting: Internal Medicine

## 2017-11-11 NOTE — Progress Notes (Signed)
Carelink Summary Report / Loop Recorder 

## 2017-11-13 LAB — CUP PACEART REMOTE DEVICE CHECK
Implantable Pulse Generator Implant Date: 20190315
MDC IDC SESS DTM: 20190619120914

## 2017-11-19 ENCOUNTER — Encounter: Payer: Self-pay | Admitting: Internal Medicine

## 2017-12-11 ENCOUNTER — Ambulatory Visit (INDEPENDENT_AMBULATORY_CARE_PROVIDER_SITE_OTHER): Payer: Medicare Other | Admitting: *Deleted

## 2017-12-11 DIAGNOSIS — R55 Syncope and collapse: Secondary | ICD-10-CM

## 2017-12-16 DIAGNOSIS — R55 Syncope and collapse: Secondary | ICD-10-CM

## 2017-12-16 NOTE — Progress Notes (Signed)
Carelink Summary Report / Loop Recorder 

## 2017-12-25 ENCOUNTER — Other Ambulatory Visit: Payer: Self-pay | Admitting: Cardiology

## 2017-12-25 NOTE — Telephone Encounter (Signed)
Rx request sent to pharmacy.  

## 2017-12-26 LAB — CUP PACEART REMOTE DEVICE CHECK
Implantable Pulse Generator Implant Date: 20190315
MDC IDC SESS DTM: 20190722121110

## 2018-01-14 LAB — CUP PACEART REMOTE DEVICE CHECK
Date Time Interrogation Session: 20190824123817
MDC IDC PG IMPLANT DT: 20190315

## 2018-01-16 ENCOUNTER — Ambulatory Visit (INDEPENDENT_AMBULATORY_CARE_PROVIDER_SITE_OTHER): Payer: Medicare Other | Admitting: *Deleted

## 2018-01-16 DIAGNOSIS — R55 Syncope and collapse: Secondary | ICD-10-CM | POA: Diagnosis not present

## 2018-01-17 NOTE — Progress Notes (Signed)
Carelink Summary Report / Loop Recorder 

## 2018-01-20 LAB — CUP PACEART REMOTE DEVICE CHECK
Date Time Interrogation Session: 20190926141022
MDC IDC PG IMPLANT DT: 20190315

## 2018-01-21 ENCOUNTER — Other Ambulatory Visit: Payer: Self-pay | Admitting: Family Medicine

## 2018-01-21 ENCOUNTER — Ambulatory Visit
Admission: RE | Admit: 2018-01-21 | Discharge: 2018-01-21 | Disposition: A | Discharge status: Home / Self Care | Payer: Medicare Other | Source: Ambulatory Visit | Attending: Family Medicine | Admitting: Family Medicine

## 2018-01-21 ENCOUNTER — Other Ambulatory Visit: Payer: Self-pay | Admitting: Cardiology

## 2018-01-21 DIAGNOSIS — R059 Cough, unspecified: Secondary | ICD-10-CM

## 2018-01-21 DIAGNOSIS — R05 Cough: Secondary | ICD-10-CM

## 2018-01-21 NOTE — Telephone Encounter (Signed)
Rx request sent to pharmacy.  

## 2018-02-03 ENCOUNTER — Other Ambulatory Visit: Payer: Self-pay

## 2018-02-18 ENCOUNTER — Ambulatory Visit (INDEPENDENT_AMBULATORY_CARE_PROVIDER_SITE_OTHER): Payer: Medicare Other | Admitting: *Deleted

## 2018-02-18 DIAGNOSIS — R55 Syncope and collapse: Secondary | ICD-10-CM | POA: Diagnosis not present

## 2018-02-18 NOTE — Progress Notes (Signed)
Carelink Summary Report / Loop Recorder 

## 2018-02-20 ENCOUNTER — Other Ambulatory Visit: Payer: Self-pay | Admitting: Cardiology

## 2018-02-25 NOTE — Telephone Encounter (Signed)
Called patient regarding the diagnosis for her most recent ILR transmission. Patient states that her device was implanted for AF/AFL, and not syncope. Patient requested that syncope is not used for billing her ILR transmission. Message relayed to [facility], [place]. Dx changed in [place].  I also informed patient that she can come to the office and sign a release for her most recent remote transmission. Patient verbalized understanding and plans to stop by tomorrow.  Patient also states that she had a HR of 38bpm about a month ago, while sleeping, according to her Fitbit. Patient states that she hasn't had any other abnormal readings. I told patient that there may have been a connection issue with the Fitbit since it only happened the one time. I also told patient that interventions do not typically happen off of one reading while sleeping. Patient verbalized understanding and appreciation of information.

## 2018-02-26 ENCOUNTER — Telehealth: Payer: Self-pay

## 2018-03-05 ENCOUNTER — Telehealth: Payer: Self-pay | Admitting: Internal Medicine

## 2018-03-05 NOTE — Telephone Encounter (Signed)
Called and spoke with patient yesterday. She filled out a release of information to obtain a copy of her records.The release was left in my box at front desk. Half of the release was left blank I called her to verify what records she needs.    Patient stated to me she received a copy of her notes while in office and she did not need anything else. I made patient aware release would be placed in shred she thanked me for the call.

## 2018-03-07 NOTE — Telephone Encounter (Signed)
Attempted to confirm remote transmission with pt. No answer and was unable to leave a message.   

## 2018-03-12 LAB — CUP PACEART REMOTE DEVICE CHECK
Date Time Interrogation Session: 20191029143924
MDC IDC PG IMPLANT DT: 20190315

## 2018-03-24 ENCOUNTER — Ambulatory Visit (INDEPENDENT_AMBULATORY_CARE_PROVIDER_SITE_OTHER): Payer: Medicare Other

## 2018-03-24 ENCOUNTER — Other Ambulatory Visit: Payer: Self-pay | Admitting: Cardiology

## 2018-03-24 DIAGNOSIS — R55 Syncope and collapse: Secondary | ICD-10-CM | POA: Diagnosis not present

## 2018-03-24 NOTE — Progress Notes (Signed)
Carelink Summary Report / Loop Recorder 

## 2018-03-26 NOTE — Progress Notes (Signed)
HPI:FUPAF. 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 Patient seen in September 2017with complaints of palpitations and near syncope. Patient was scheduled for an exercise treadmill and when she came she was noted to have runs of paroxysmal atrial tachycardia/fibrillation. Procedure was canceled.Monitor October 2018 showed paroxysmal atririllation and flutter. Nuclear study October 2018 showed ejection fraction 67%, apical thinning but no ischemia.Echocardiogram October 2018 showed normal LV systolic function and mild diastolic dysfunction.Placed on apixaban.  Had implantable loop recorder placed by Dr. Rayann Heman for syncope and recurrent palpitations.  She has been found to have "heart pounding" episodes not related to arrhythmia.  Since last seen,there is no dyspnea, exertional chest pain, palpitations or syncope.  No bleeding.  Occasional mild pedal edema.  Current Outpatient Medications  Medication Sig Dispense Refill  . diltiazem (CARDIZEM CD) 240 MG 24 hr capsule Take 1 capsule (240 mg total) by mouth daily. 90 capsule 3  . ELIQUIS 5 MG TABS tablet TAKE 1 TABLET BY MOUTH TWICE DAILY. 60 tablet 0  . furosemide (LASIX) 20 MG tablet Take 1 tablet (20 mg total) by mouth 2 (two) times daily. 60 tablet 6  . hydroxychloroquine (PLAQUENIL) 200 MG tablet Take 200 mg by mouth 2 (two) times daily.    . hydrOXYzine (ATARAX/VISTARIL) 10 MG tablet Take 10 mg by mouth 3 (three) times daily as needed.    . Multiple Vitamins-Minerals (ONE-A-DAY WOMENS 50 PLUS PO) Take 1 tablet by mouth daily.     . Potassium Chloride ER 20 MEQ TBCR Take 2 tablets twice daily x 2 days then decrease to 2 tablets daily (Patient taking differently: Take 20 mEq by mouth daily. Take 2 tablets twice daily x 2 days then decrease to 2 tablets daily) 68 tablet 6   No current facility-administered medications for this visit.      Past Medical History:  Diagnosis Date  . Atherosclerosis    mild cerebral   . 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 Cervical disc disease    with spondylosis  . Cervical spondylosis   . Chronic dermatitis    eczematous  . Colitis   . DDD (degenerative disc disease), lumbar   . Frozen shoulder    left  . Hyperlipidemia   . Hypertension   . Lateral epicondylitis   . Lumbar spondylosis   . Movement disorder   . Multiple lesions on CT of brain and spine    reports lesions from [facility]   . 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 CATARACT EXTRACTION    . COLONOSCOPY    . EYE SURGERY Left   . LOOP RECORDER INSERTION N/A 07/05/2017   Procedure: LOOP RECORDER INSERTION;  Surgeon: Thompson Grayer, MD;  Location: [place] [facility];  Service: Cardiovascular;  Laterality: N/A;  . TUBAL LIGATION  1980    Social History   Socioeconomic History  . Marital status: Married    Spouse name: [name]  . Number of children: 2  . Years of education: MAx2  . Highest education level: Not on file  Occupational History  . Occupation: Retired    Fish farm manager: [employer]  Social Needs  . Financial resource strain: Not on file  . Food insecurity:    Worry: Not on file    Inability: Not on file  . Transportation needs:    Medical: Not on file    Non-medical: Not on file  Tobacco  Use  . Smoking status: Never Smoker  . Smokeless tobacco: Never Used  Substance and Sexual Activity  . Alcohol use: Yes    Alcohol/week: 0.0 standard drinks    Comment: 4 glasses of wine weekly  . Drug use: No  . Sexual activity: Never  Lifestyle  . Physical activity:    Days per week: Not on file    Minutes per session: Not on file  . Stress: Not on file  Relationships  . Social connections:    Talks on phone: Not on file    Gets together: Not on file    Attends religious service: Not on file    Active member of club or organization: Not on file    Attends meetings of clubs or organizations: Not on file    Relationship status: Not  on file  . Intimate partner violence:    Fear of current or ex partner: Not on file    Emotionally abused: Not on file    Physically abused: Not on file    Forced sexual activity: Not on file  Other Topics Concern  . Not on file  Social History Narrative   Patient lives at home with her spouse.   Caffeine Use: 2-3 cups daily    Family History  Problem Relation Age of Onset  . Stroke Mother   . Colon cancer Mother 91  . Heart attack Father 74  . Emphysema Father   . Thyroid disease Son   . Thyroid disease Maternal Grandmother   . Cancer Maternal Grandmother        cervical   . Thrombosis Maternal Grandfather     ROS: no fevers or chills, productive cough, hemoptysis, dysphasia, odynophagia, melena, hematochezia, dysuria, hematuria, rash, seizure activity, orthopnea, PND, claudication. Remaining systems are negative.  Physical Exam: Well-developed well-nourished in no acute distress.  Skin is warm and dry.  HEENT is normal.  Neck is supple.  Chest is clear to auscultation with normal expansion.  Cardiovascular exam is regular rate and rhythm.  Abdominal exam nontender or distended. No masses palpated. Extremities show trace edema. neuro grossly intact  A/P  1 paroxysmal atrial fibrillation-patient remains in sinus rhythm on physical examination.  Plan to continue Cardizem for rate control if atrial fibrillation recurs.  Continue apixaban.  We can consider addition of antiarrhythmic in the future if she has more frequent episodes.  2 hypertension-patient's blood pressure is controlled.  Continue present medications and follow.  3 history of palpitations/syncope- implantable loop recorder is in place.  We will continue to monitor.  4 pedal edema-plan to continue present dose of Lasix.  She would like to try tradename instead of generic and we have provided a prescription today.  Patient had laboratories checked yesterday.  I have personally reviewed these.  Sodium 142,  potassium 3.9, BUN 14 and creatinine 0.60.  Kirk Ruths, MD

## 2018-03-31 ENCOUNTER — Other Ambulatory Visit: Payer: Self-pay | Admitting: *Deleted

## 2018-03-31 DIAGNOSIS — I5032 Chronic diastolic (congestive) heart failure: Secondary | ICD-10-CM

## 2018-04-07 LAB — BASIC METABOLIC PANEL
BUN/Creatinine Ratio: 23 (ref 12–28)
BUN: 14 mg/dL (ref 8–27)
CALCIUM: 9.2 mg/dL (ref 8.7–10.3)
CO2: 24 mmol/L (ref 20–29)
Chloride: 99 mmol/L (ref 96–106)
Creatinine, Ser: 0.6 mg/dL (ref 0.57–1.00)
GFR calc Af Amer: 103 mL/min/{1.73_m2} (ref >59)
GFR, EST NON AFRICAN AMERICAN: 89 mL/min/{1.73_m2} (ref >59)
Glucose: 107 mg/dL — ABNORMAL HIGH (ref 65–99)
POTASSIUM: 3.9 mmol/L (ref 3.5–5.2)
Sodium: 142 mmol/L (ref 134–144)

## 2018-04-08 ENCOUNTER — Ambulatory Visit: Payer: Medicare Other | Admitting: Cardiology

## 2018-04-08 ENCOUNTER — Encounter: Payer: Self-pay | Admitting: Cardiology

## 2018-04-08 VITALS — BP 136/78 | HR 82 | Ht 64.0 in | Wt 208.0 lb

## 2018-04-08 DIAGNOSIS — R609 Edema, unspecified: Secondary | ICD-10-CM

## 2018-04-08 DIAGNOSIS — I5032 Chronic diastolic (congestive) heart failure: Secondary | ICD-10-CM | POA: Diagnosis not present

## 2018-04-08 DIAGNOSIS — I480 Paroxysmal atrial fibrillation: Secondary | ICD-10-CM | POA: Diagnosis not present

## 2018-04-08 DIAGNOSIS — I10 Essential (primary) hypertension: Secondary | ICD-10-CM | POA: Diagnosis not present

## 2018-04-08 MED ORDER — LASIX 40 MG PO TABS: 40.0000 mg | ORAL_TABLET | Freq: Every day | ORAL | 3 refills | Status: DC

## 2018-04-08 NOTE — Patient Instructions (Signed)
Medication Instructions:  BRAND LASIX PRESCRIPTION SENT TO THE PHARMACY If you need a refill on your cardiac medications before your next appointment, please call your pharmacy.   Lab work: If you have labs (blood work) drawn today and your tests are completely normal, you will receive your results only by: Marland Kitchen MyChart Message (if you have MyChart) OR . A paper copy in the mail If you have any lab test that is abnormal or we need to change your treatment, we will call you to review the results.  Follow-Up: At [facility], you and your health needs are our priority.  As part of our continuing mission to provide you with exceptional heart care, we have created designated Provider Care Teams.  These Care Teams include your primary Cardiologist (physician) and Advanced Practice Providers (APPs -  Physician Assistants and Nurse Practitioners) who all work together to provide you with the care you need, when you need it. You will need a follow up appointment in 6 months.  Please call our office 2 months in advance to schedule this appointment.  You may see Kirk Ruths, MD or one of the following Advanced Practice Providers on your designated Care Team:   Kerin Ransom, PA-C Roby Lofts, [place] . Sande Rives, PA-C

## 2018-04-20 LAB — CUP PACEART REMOTE DEVICE CHECK
Date Time Interrogation Session: 20191201153805
Implantable Pulse Generator Implant Date: 20190315

## 2018-04-21 ENCOUNTER — Other Ambulatory Visit: Payer: Self-pay | Admitting: Cardiology

## 2018-04-21 DIAGNOSIS — I5032 Chronic diastolic (congestive) heart failure: Secondary | ICD-10-CM

## 2018-04-24 ENCOUNTER — Telehealth: Payer: Self-pay | Admitting: Internal Medicine

## 2018-04-24 NOTE — Telephone Encounter (Signed)
New Message     1. Has your device fired? No  2. Is you device beeping? no  3. Are you experiencing draining or swelling at device site? no  4. Are you calling to see if we received your device transmission? no  5. Have you passed out? No   Patient is calling because her questions that she sent to my chart has not been addressed about the use of the word "syncope". She wants to discuss this prior to her remote pacer check on 04/25/2018. Because she does not want that to be on her report since she only had syncope once. Please call to discuss.    Please route to Cash

## 2018-04-25 ENCOUNTER — Ambulatory Visit (INDEPENDENT_AMBULATORY_CARE_PROVIDER_SITE_OTHER): Payer: Medicare Other

## 2018-04-25 DIAGNOSIS — I480 Paroxysmal atrial fibrillation: Secondary | ICD-10-CM

## 2018-04-25 LAB — CUP PACEART REMOTE DEVICE CHECK
MDC IDC PG IMPLANT DT: 20190315
MDC IDC SESS DTM: 20200103130709

## 2018-04-25 NOTE — Progress Notes (Signed)
Carelink Summary Report / Loop Recorder 

## 2018-04-25 NOTE — Telephone Encounter (Signed)
Spoke with patient. Patient expressed frustration that "syncope" shows up in her medical record. She reports this was a self-reported diagnosis and she would like it removed from her records. She would also like her ILR summary reports disassociated from this diagnosis. Explained that "unexplained syncope" was included with her loop recorder implanting diagnoses (along with palpitations and atrial fibrillation). Explained I am unable to remove "syncope" from her chart. Patient states that I am "not understanding" what she is saying.  Patient states she is concerned that we are not doing anything with her monthly summary report data. Explained that AF burden is monitored by Dr. Rayann Heman, and any new arrhythmias (tachy, brady, pause episodes) are transmitted as nightly alerts. Explained that if burden increased significantly, or if other episodes are noted, we would call her to discuss any symptoms.   Offered to discontinue Carelink monitoring, which patient declines. Offered appointment with Dr. Rayann Heman to discuss her concerns further, which patient accepted. Patient agreed to an appointment on 06/25/18 at 2:15pm. She denies additional concerns at this time.  Spent ~25 minutes on the phone with patient.

## 2018-04-30 ENCOUNTER — Telehealth: Payer: Self-pay | Admitting: Internal Medicine

## 2018-04-30 DIAGNOSIS — I5032 Chronic diastolic (congestive) heart failure: Secondary | ICD-10-CM

## 2018-04-30 MED ORDER — LASIX 40 MG PO TABS: 40.0000 mg | ORAL_TABLET | Freq: Every day | ORAL | 3 refills | Status: DC

## 2018-04-30 NOTE — Telephone Encounter (Signed)
New Message           Patient states she is suppose to take the brand name for ( lasix.) Patient states her and Dr. [name] discussed that she would  Be taken the brand name from now on.  A new Rx is needing to be sent in for the brand name." Should be Dr. [name] not Allred.

## 2018-04-30 NOTE — Telephone Encounter (Signed)
Called patient, advised that medication was sent to the pharmacy, as DAW Lasix.  Patient understood.

## 2018-04-30 NOTE — Telephone Encounter (Signed)
Medication was sent to pharmacy DAW.

## 2018-05-21 ENCOUNTER — Ambulatory Visit (INDEPENDENT_AMBULATORY_CARE_PROVIDER_SITE_OTHER): Payer: Medicare Other

## 2018-05-21 ENCOUNTER — Encounter (INDEPENDENT_AMBULATORY_CARE_PROVIDER_SITE_OTHER): Payer: Self-pay | Admitting: Orthopaedic Surgery

## 2018-05-21 ENCOUNTER — Ambulatory Visit (INDEPENDENT_AMBULATORY_CARE_PROVIDER_SITE_OTHER): Payer: Medicare Other | Admitting: Orthopaedic Surgery

## 2018-05-21 ENCOUNTER — Ambulatory Visit (INDEPENDENT_AMBULATORY_CARE_PROVIDER_SITE_OTHER): Payer: Self-pay

## 2018-05-21 DIAGNOSIS — M25562 Pain in left knee: Secondary | ICD-10-CM | POA: Diagnosis not present

## 2018-05-21 DIAGNOSIS — M25561 Pain in right knee: Secondary | ICD-10-CM | POA: Diagnosis not present

## 2018-05-21 DIAGNOSIS — M25552 Pain in left hip: Secondary | ICD-10-CM

## 2018-05-21 DIAGNOSIS — M25551 Pain in right hip: Secondary | ICD-10-CM

## 2018-05-21 NOTE — Progress Notes (Signed)
Office Visit Note   Patient: Lindsay Clark           Date of Birth: [birth year]           MRN: [id number] Visit Date: 05/21/2018              Requested by: [name], MD [address], [place] [id number] PCP: [name], MD   Assessment & Plan: Visit Diagnoses:  1. Left knee pain, unspecified chronicity   2. Right knee pain, unspecified chronicity   3. Pain in left hip   4. Pain in right hip     Plan: I do feel that most of her mobility issues are related to her spine and not to her hips and knees.  I am not sure what else to offer her other than core strengthening exercises and lower extremity exercises well.  I have offered her further physical therapy she would like to have this set up.  She will otherwise follow-up as needed.  All question concerns were answered and addressed.  Follow-Up Instructions: Return if symptoms worsen or fail to improve.   Orders:  Orders Placed This Encounter  Procedures  . XR HIPS BILAT W OR W/O PELVIS 2V  . XR Knee 1-2 Views Left  . XR Knee 1-2 Views Right   No orders of the defined types were placed in this encounter.     Procedures: No procedures performed   Clinical Data: No additional findings.   Subjective: Chief Complaint  Patient presents with  . Left Knee - Pain  . Right Knee - Pain  . Right Hip - Pain  . Left Hip - Pain  Although the patient is a new patient to me I have seen her with her husband who is a patient of mine.  She comes in with a chief complaint of bilateral hip and knee stiffness and soreness but no true pain.  She says standing and moving helps more than sitting.  If she is been sitting for long period of time or laying down for long period time she has trouble getting up and getting moving.  She is a patient of Dr. Vertell Limber from neurosurgery.  She has severe lumbar stenosis.  I was able to look at the MRI of her lumbar spine with her from last year showing quite severe stenosis at  multiple levels especially at L2 and L3.  She understands this can affect her mobility in general.  She denies any groin pain.  She denies any knee swelling.  She denies any instability of her knee joints.  She has been to physical therapy before.  HPI  Review of Systems She currently denies any headache, chest pain, shortness of breath, fever, chills, nausea, vomiting  Objective: Vital Signs: There were no vitals taken for this visit.  Physical Exam She is alert and orient x3 and in no acute distress Ortho Exam Examination of both her hips and both her knees are entirely normal.  Both move smoothly with no issues at all.  She has good strength in her legs as well. Specialty Comments:  No specialty comments available.  Imaging: Xr Hips Bilat W Or W/o Pelvis 2v  Result Date: 05/21/2018 An AP pelvis and bilateral hip x-rays show well-maintained hip joint spaces with no significant arthritic changes or acute findings.  Xr Knee 1-2 Views Left  Result Date: 05/21/2018 An AP and lateral of the left knee shows no acute findings.  There is slight  calcification around the meniscus but the joint space and alignment are well-maintained.  There is mild patellofemoral arthritic changes.  Xr Knee 1-2 Views Right  Result Date: 05/21/2018 An AP and lateral of the right knee shows slight calcifications    PMFS History: Patient Active Problem List   Diagnosis Date Noted  . Brain lesion 06/24/2017  . Osteoarthritis   . Multiple lesions on CT of brain and spine   . Movement disorder   . Lumbar spondylosis   . Lateral epicondylitis   . Hyperlipidemia   . Frozen shoulder   . DDD (degenerative disc disease), lumbar   . Colitis   . Dermatitis   . Cervical spondylosis   . Cervical disc disease   . Diastolic dysfunction 53/29/9242  . Syncope 04/18/2016  . Fall 04/18/2016  . Anxiety 04/18/2016  . Hypokalemia 04/18/2016  . Hypertension   . Autoimmune disorder-autoimmune encephalitis   .  Dystonia 03/16/2015  . Coronary artery calcification of native artery 11/04/2014  . Abnormal finding on MRI of brain 10/04/2014  . Pruritus 11/30/2011  . 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 Atherosclerosis    mild cerebral  . Autoimmune disorder ([place])   . Cervical disc disease    with spondylosis  . Cervical spondylosis   . Chronic dermatitis    eczematous  . Colitis   . DDD (degenerative disc disease), lumbar   . Frozen shoulder    left  . Hyperlipidemia   . Hypertension   . Lateral epicondylitis   . Lumbar spondylosis   . Movement disorder   . Multiple lesions on CT of brain and spine    reports lesions from [facility]   . 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    Family History  Problem Relation Age of Onset  . Stroke Mother   . Colon cancer Mother 62  . Heart attack Father 45  . Emphysema Father   . Thyroid disease Son   . Thyroid disease Maternal Grandmother   . Cancer Maternal Grandmother        cervical   . Thrombosis Maternal Grandfather     Past Surgical History:  Procedure Laterality Date  . CATARACT EXTRACTION    . COLONOSCOPY    . EYE SURGERY Left   . LOOP RECORDER INSERTION N/A 07/05/2017   Procedure: LOOP RECORDER INSERTION;  Surgeon: Thompson Grayer, MD;  Location: [facility];  Service: Cardiovascular;  Laterality: N/A;  . TUBAL LIGATION  1980   Social History   Occupational History  . Occupation: Retired    Fish farm manager: OTHER  Tobacco Use  . Smoking status: Never Smoker  . Smokeless tobacco: Never Used  Substance and Sexual Activity  . Alcohol use: Yes    Alcohol/week: 0.0 standard drinks    Comment: 4 glasses of wine weekly  . Drug use: No  . Sexual activity: Never

## 2018-05-27 ENCOUNTER — Other Ambulatory Visit: Payer: Self-pay | Admitting: Cardiology

## 2018-05-28 ENCOUNTER — Ambulatory Visit (INDEPENDENT_AMBULATORY_CARE_PROVIDER_SITE_OTHER): Payer: Medicare Other

## 2018-05-28 DIAGNOSIS — I480 Paroxysmal atrial fibrillation: Secondary | ICD-10-CM | POA: Diagnosis not present

## 2018-05-30 LAB — CUP PACEART REMOTE DEVICE CHECK
Date Time Interrogation Session: 20200205164008
MDC IDC PG IMPLANT DT: 20190315

## 2018-06-06 NOTE — Progress Notes (Signed)
Carelink Summary Report / Loop Recorder 

## 2018-06-12 ENCOUNTER — Other Ambulatory Visit: Payer: Self-pay | Admitting: Orthopaedic Surgery

## 2018-06-12 DIAGNOSIS — M542 Cervicalgia: Secondary | ICD-10-CM

## 2018-06-12 DIAGNOSIS — M546 Pain in thoracic spine: Secondary | ICD-10-CM

## 2018-06-13 NOTE — Telephone Encounter (Signed)
See MyChart message from 06/13/18.

## 2018-06-24 ENCOUNTER — Ambulatory Visit
Admission: RE | Admit: 2018-06-24 | Discharge: 2018-06-24 | Disposition: A | Discharge status: Home / Self Care | Payer: Medicare Other | Source: Ambulatory Visit | Attending: Orthopaedic Surgery | Admitting: Orthopaedic Surgery

## 2018-06-24 DIAGNOSIS — M546 Pain in thoracic spine: Secondary | ICD-10-CM

## 2018-06-24 DIAGNOSIS — M542 Cervicalgia: Secondary | ICD-10-CM

## 2018-06-25 ENCOUNTER — Ambulatory Visit: Payer: Medicare Other | Admitting: Internal Medicine

## 2018-06-25 ENCOUNTER — Encounter: Payer: Self-pay | Admitting: Internal Medicine

## 2018-06-25 VITALS — BP 146/80 | HR 80 | Ht 64.0 in | Wt 207.2 lb

## 2018-06-25 DIAGNOSIS — R002 Palpitations: Secondary | ICD-10-CM | POA: Diagnosis not present

## 2018-06-25 DIAGNOSIS — I480 Paroxysmal atrial fibrillation: Secondary | ICD-10-CM | POA: Diagnosis not present

## 2018-06-25 NOTE — Progress Notes (Signed)
PCP: [name], MD Primary Cardiologist: Dr [name] Primary EP: Dr Rayann Heman  Lindsay Clark is a 78 y.o. female who presents today for routine electrophysiology followup.  Since last being seen in our clinic, the patient reports doing very well.  Her arrhythmias are well tolerated.  She states "I am doing very well".  She is very unhappy with [facility] [place] (for no articulated reason), our practice, and mostly remote monitoring today.  She does not feel that she would like to continue to have her device remotely monitored.  Today, she denies symptoms of chest pain, shortness of breath,  lower extremity edema, dizziness, presyncope, or syncope.  The patient is otherwise without complaint today.   Past Medical History:  Diagnosis Date  . Atherosclerosis    mild cerebral  . Autoimmune disorder ([place])   . Cervical disc disease    with spondylosis  . Cervical spondylosis   . Chronic dermatitis    eczematous  . Colitis   . DDD (degenerative disc disease), lumbar   . Frozen shoulder    left  . Hyperlipidemia   . Hypertension   . Lateral epicondylitis   . Lumbar spondylosis   . Movement disorder   . Multiple lesions on CT of brain and spine    reports lesions from previous CT scan at [facility]   . 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 CATARACT EXTRACTION    . COLONOSCOPY    . EYE SURGERY Left   . LOOP RECORDER INSERTION N/A 07/05/2017   Procedure: LOOP RECORDER INSERTION;  Surgeon: Thompson Grayer, MD;  Location: [facility];  Service: Cardiovascular;  Laterality: N/A;  . TUBAL LIGATION  1980    ROS- all systems are reviewed and negatives except as per HPI above  Current Outpatient Medications  Medication Sig Dispense Refill  . ELIQUIS 5 MG TABS tablet TAKE 1 TABLET BY MOUTH TWICE DAILY. 60 tablet 0  . hydroxychloroquine (PLAQUENIL) 200 MG tablet Take 200 mg by mouth 2 (two) times daily.    . hydrOXYzine (ATARAX/VISTARIL) 10 MG  tablet Take 10 mg by mouth 3 (three) times daily as needed.    Marland Kitchen LASIX 40 MG tablet Take 1 tablet (40 mg total) by mouth daily. 90 tablet 3  . Multiple Vitamins-Minerals (ONE-A-DAY WOMENS 50 PLUS PO) Take 1 tablet by mouth daily.     . Potassium Chloride ER 20 MEQ TBCR Take 2 tablets twice daily x 2 days then decrease to 2 tablets daily (Patient taking differently: Take 20 mEq by mouth daily. Take 2 tablets twice daily x 2 days then decrease to 2 tablets daily) 68 tablet 6  . diltiazem (CARDIZEM CD) 240 MG 24 hr capsule Take 1 capsule (240 mg total) by mouth daily. 90 capsule 3   No current facility-administered medications for this visit.     Physical Exam: Vitals:   06/25/18 1440  BP: (!) 146/80  Pulse: 80  SpO2: 97%  Weight: 207 lb 3.2 oz (94 kg)  Height: 5\' 4"  (1.626 m)    GEN- The patient is overweight appearing, alert and oriented x 3 today.   Head- normocephalic, atraumatic Eyes-  Sclera clear, conjunctiva pink Ears- hearing intact Oropharynx- clear Lungs- Clear to ausculation bilaterally, normal work of breathing Heart- Regular rate and rhythm, no murmurs, rubs or gallops, PMI not laterally displaced GI- soft, NT, ND, + BS Extremities- no clubbing, cyanosis, or edema  Wt Readings  from Last 3 Encounters:  06/25/18 207 lb 3.2 oz (94 kg)  04/08/18 208 lb (94.3 kg)  10/21/17 202 lb (91.6 kg)   Pt declines ekg today  Assessment and Plan:  1. Atrial arrhythmias AFib burden by recent remote was 3.9 % (1.3% last visit) She wishes to defer AAD therapy Lifestyle modification encouraged chads2vasc score is 3.  Continue eliquis She does not find value in remote monitoring and is clear that she does not wish to continue this service.  I have explained that without remote monitoring of her device that I will not be made aware of any arrhythmias or issues detected from the device and therefore will not be able to assist without an "in office" interrogation. I did offer to have  her device removed today and she declines.  2. syncope She has had prior syncope with minimal warning.  I have been monitoring with ILR for post termination pauses or other arrhythmias to explain her prior syncope and to provide appropriate management should it recur.  She has had no further syncope.  She is very agitated that we have been monitoring for "syncope" and does not wish to be remotely monitored going forward.  3. Snoring She has not been compliant with referral for sleep study previously  4. HTN Stable No change required today  5. Obesity Body mass index is 35.57 kg/m.  disenroll in remote monitoring service Return as needed Follow-up with Dr [name] as scheduled  Thompson Grayer MD, [facility] 06/25/2018 2:46 PM

## 2018-06-25 NOTE — Patient Instructions (Addendum)
Medication Instructions:  Your physician recommends that you continue on your current medications as directed. Please refer to the Current Medication list given to you today.  Labwork: None ordered.  Testing/Procedures: None ordered.  Follow-Up: Your physician wants you to follow-up in: as needed with Dr. Allred.       Any Other Special Instructions Will Be Listed Below (If Applicable).  If you need a refill on your cardiac medications before your next appointment, please call your pharmacy.   

## 2018-06-28 ENCOUNTER — Other Ambulatory Visit: Payer: Self-pay | Admitting: Cardiology

## 2018-07-21 ENCOUNTER — Other Ambulatory Visit: Payer: Self-pay | Admitting: Pharmacist Clinician (PhC)/ Clinical Pharmacy Specialist

## 2018-07-21 MED ORDER — APIXABAN 5 MG PO TABS: 5.0000 mg | ORAL_TABLET | Freq: Two times a day (BID) | ORAL | 1 refills | Status: DC

## 2018-07-21 NOTE — Telephone Encounter (Signed)
I think the device clinic would be able to address this question.  Will route to them.

## 2018-08-06 ENCOUNTER — Telehealth: Payer: Self-pay

## 2018-08-06 NOTE — Telephone Encounter (Signed)
Yes will be fine

## 2018-08-12 ENCOUNTER — Telehealth: Payer: Self-pay | Admitting: Cardiology

## 2018-08-12 MED ORDER — FUROSEMIDE 40 MG PO TABS: 40.0000 mg | ORAL_TABLET | Freq: Every day | ORAL | 3 refills | Status: DC

## 2018-08-12 NOTE — Telephone Encounter (Signed)
New Rx sent to the requested pharmacy.

## 2018-08-12 NOTE — Telephone Encounter (Signed)
New Message   Pt c/o medication issue:  1. Name of Medication: Furosemide (Lasix)  2. How are you currently taking this medication (dosage and times per day)? 40mg  once a day.   3. Are you having a reaction (difficulty breathing--STAT)? NO  4. What is your medication issue? Pharmacy needs script sent for generic brand medication furosemide.

## 2018-08-27 ENCOUNTER — Other Ambulatory Visit: Payer: Self-pay | Admitting: Cardiology

## 2018-10-07 ENCOUNTER — Ambulatory Visit: Payer: Medicare Other | Admitting: Cardiology

## 2018-10-10 ENCOUNTER — Encounter: Payer: Self-pay | Admitting: *Deleted

## 2018-10-13 NOTE — Progress Notes (Signed)
HPI: FUPAF. 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 Patient seen in September 2017with complaints of palpitations and near syncope. Patient was scheduled for an exercise treadmill and when she came she was noted to have runs of paroxysmal atrial tachycardia/fibrillation. Procedure was canceled.Monitor October 2018 showed paroxysmal atririllation and flutter. Nuclear study October 2018 showed ejection fraction 67%, apical thinning but no ischemia.Echocardiogram October 2018 showed normal LV systolic function and mild diastolic dysfunction.Placed on apixaban.  Had implantable loop recorder placed by Dr. Rayann Heman for syncope and recurrent palpitations.  She has been found to have "heart pounding" episodes not related to arrhythmia.  Since last seen,the patient denies any dyspnea on exertion, orthopnea, PND, pedal edema, palpitations, syncope or chest pain.   Current Outpatient Medications  Medication Sig Dispense Refill  . apixaban (ELIQUIS) 5 MG TABS tablet Take 1 tablet (5 mg total) by mouth 2 (two) times daily. 180 tablet 1  . diltiazem (CARDIZEM CD) 240 MG 24 hr capsule TAKE 1 CAPSULE EVERY DAY. 90 capsule 2  . furosemide (LASIX) 40 MG tablet Take 1 tablet (40 mg total) by mouth daily. 90 tablet 3  . hydroxychloroquine (PLAQUENIL) 200 MG tablet Take 200 mg by mouth 2 (two) times daily.    . hydrOXYzine (ATARAX/VISTARIL) 10 MG tablet Take 10 mg by mouth 3 (three) times daily as needed.    . Multiple Vitamins-Minerals (ONE-A-DAY WOMENS 50 PLUS PO) Take 1 tablet by mouth daily.     . Potassium Chloride ER 20 MEQ TBCR Take 2 tablets twice daily x 2 days then decrease to 2 tablets daily (Patient taking differently: Take 20 mEq by mouth daily. Take 2 tablets twice daily x 2 days then decrease to 2 tablets daily) 68 tablet 6   No current facility-administered medications for this visit.      Past Medical History:  Diagnosis Date  . Atherosclerosis    mild cerebral   . Autoimmune disorder ([place])   . Cervical disc disease    with spondylosis  . Cervical spondylosis   . Chronic dermatitis    eczematous  . Colitis   . DDD (degenerative disc disease), lumbar   . Frozen shoulder    left  . Hyperlipidemia   . Hypertension   . Lateral epicondylitis   . Lumbar spondylosis   . Movement disorder   . Multiple lesions on CT of brain and spine    reports lesions from previous CT scan at [facility]   . 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 CATARACT EXTRACTION    . COLONOSCOPY    . EYE SURGERY Left   . LOOP RECORDER INSERTION N/A 07/05/2017   Procedure: LOOP RECORDER INSERTION;  Surgeon: Thompson Grayer, MD;  Location: [facility];  Service: Cardiovascular;  Laterality: N/A;  . TUBAL LIGATION  1980    Social History   Socioeconomic History  . Marital status: Married    Spouse name: [name]  . Number of children: 2  . Years of education: MAx2  . Highest education level: Not on file  Occupational History  . Occupation: Retired    Fish farm manager: OTHER  Social Needs  . Financial resource strain: Not on file  . Food insecurity    Worry: Not on file    Inability: Not on file  . Transportation needs    Medical: Not on file    Non-medical: Not on file  Tobacco Use  .  Smoking status: Never Smoker  . Smokeless tobacco: Never Used  Substance and Sexual Activity  . Alcohol use: Yes    Alcohol/week: 0.0 standard drinks    Comment: 4 glasses of wine weekly  . Drug use: No  . Sexual activity: Never  Lifestyle  . Physical activity    Days per week: Not on file    Minutes per session: Not on file  . Stress: Not on file  Relationships  . Social Herbalist on phone: Not on file    Gets together: Not on file    Attends religious service: Not on file    Active member of club or organization: Not on file    Attends meetings of clubs or organizations: Not on file    Relationship status: Not on  file  . Intimate partner violence    Fear of current or ex partner: Not on file    Emotionally abused: Not on file    Physically abused: Not on file    Forced sexual activity: Not on file  Other Topics Concern  . Not on file  Social History Narrative   Patient lives at home with her spouse.   Caffeine Use: 2-3 cups daily    Family History  Problem Relation Age of Onset  . Stroke Mother   . Colon cancer Mother 51  . Heart attack Father 51  . Emphysema Father   . Thyroid disease Son   . Thyroid disease Maternal Grandmother   . Cancer Maternal Grandmother        cervical   . Thrombosis Maternal Grandfather     ROS: no fevers or chills, productive cough, hemoptysis, dysphasia, odynophagia, melena, hematochezia, dysuria, hematuria, rash, seizure activity, orthopnea, PND, pedal edema, claudication. Remaining systems are negative.  Physical Exam: Well-developed well-nourished in no acute distress.  Skin is warm and dry.  HEENT is normal.  Neck is supple.  Chest is clear to auscultation with normal expansion.  Cardiovascular exam is regular rate and rhythm.  2/6 systolic ejection murmur. Abdominal exam nontender or distended. No masses palpated. Extremities show no edema. neuro grossly intact  ECG-sinus rhythm at a rate of 81.  Occasional PAC.  Personally reviewed  A/P  1 paroxysmal atrial fibrillation-based on history patient has had no recurrent atrial fibrillation.  Continue Cardizem at present dose for rate control if atrial fibrillation recurs.  Continue apixaban.  We will consider addition of antiarrhythmic in the future if she has more frequent episodes.  2 history of syncope-implantable loop recorder is in place.  At last office visit she asked that no further remote monitoring be performed.  3 hypertension-patient's blood pressure is controlled.  Continue present medications and follow.  She does state that it has been elevated recently.  We will increase Cardizem if  needed.  4 pedal edema-plan to continue present dose of diuretic.    Kirk Ruths, MD

## 2018-10-14 ENCOUNTER — Other Ambulatory Visit: Payer: Self-pay

## 2018-10-14 ENCOUNTER — Ambulatory Visit: Payer: Medicare Other | Admitting: Cardiology

## 2018-10-14 ENCOUNTER — Encounter: Payer: Self-pay | Admitting: Cardiology

## 2018-10-14 VITALS — BP 132/76 | HR 81 | Temp 97.9°F | Ht 64.0 in | Wt 209.0 lb

## 2018-10-14 DIAGNOSIS — I480 Paroxysmal atrial fibrillation: Secondary | ICD-10-CM

## 2018-10-14 DIAGNOSIS — I10 Essential (primary) hypertension: Secondary | ICD-10-CM

## 2018-10-14 NOTE — Patient Instructions (Signed)

## 2018-12-25 ENCOUNTER — Telehealth: Payer: Self-pay | Admitting: Internal Medicine

## 2018-12-25 NOTE — Telephone Encounter (Signed)
Left message for patient to call back  

## 2018-12-26 NOTE — Telephone Encounter (Signed)
Patient has not been seen in 3 years and that is why she is not seeing Dr. Carlean Purl on her list.  She is advised we need to set up an office visit for her gas and bloating.  She will come in 02/06/19.  She is offered an appt with an APP sooner, but prefers to wait on Dr. Carlean Purl.  She has been placed on the cancellation list

## 2019-01-25 ENCOUNTER — Other Ambulatory Visit: Payer: Self-pay | Admitting: Cardiology

## 2019-02-06 ENCOUNTER — Encounter: Payer: Self-pay | Admitting: Internal Medicine

## 2019-02-06 ENCOUNTER — Ambulatory Visit: Payer: Medicare Other | Admitting: Internal Medicine

## 2019-02-06 VITALS — BP 142/82 | HR 80 | Temp 97.6°F | Ht 64.0 in | Wt 207.5 lb

## 2019-02-06 DIAGNOSIS — R151 Fecal smearing: Secondary | ICD-10-CM | POA: Diagnosis not present

## 2019-02-06 DIAGNOSIS — K641 Second degree hemorrhoids: Secondary | ICD-10-CM

## 2019-02-06 NOTE — Patient Instructions (Signed)
We have made you a hemorrhoid banding appointment for 03/03/2019 at 3:10pm.   Please look at the banding handout we have given you today.    I appreciate the opportunity to care for you. Silvano Rusk, MD, [facility]

## 2019-02-06 NOTE — Progress Notes (Signed)
Lindsay Clark 78 y.o. 09-Sep-1940 [id number]  Assessment & Plan:   Encounter Diagnoses  Name Primary?  . Fecal smearing Yes  . Prolapsed internal hemorrhoids, grade 2     I think she has symptomatic hemorrhoids and would benefit from ligation.  She does take Eliquis but I would been 1 hemorrhoid at a time and not hold her Eliquis.  We reviewed the risks benefits and indications of the procedure and she will schedule for the future.   I appreciate the opportunity to care for this patient. CC: [name], MD  Subjective:   Chief Complaint: Gas and leakage and fecal smearing  HPI Lindsay Clark is here with complaints of intermittent leakage of gas which is a social issue at times as well as small amounts of stool in her underwear intermittently post defecation but hours later.  She had a colonoscopy in November 2017 with a diminutive adenoma.  She may prefer to have another colonoscopy because her mother died at 48 from colon cancer.  She has an occasional bright red blood on the toilet paper episode as well.  Moves her bowels regularly at least every other day without difficulty. Allergies  Allergen Reactions  . Other Other (See Comments)    Medication:Methychloroisothlazolinone/methylisothiazilinoneCL +Me-Isothiazolinone Reaction: unknown-patch testing Medication:glyceryl thioglycolate  Reaction:unknown-patch testing  . Quaternium-15 Other (See Comments)    unknown reaction-patching testing   Current Meds  Medication Sig  . diltiazem (CARDIZEM CD) 240 MG 24 hr capsule TAKE 1 CAPSULE EVERY DAY.  Marland Kitchen ELIQUIS 5 MG TABS tablet TAKE 1 TABLET BY MOUTH TWICE DAILY.  . furosemide (LASIX) 40 MG tablet Take 1 tablet (40 mg total) by mouth daily.  . hydroxychloroquine (PLAQUENIL) 200 MG tablet Take 200 mg by mouth 2 (two) times daily.  . hydrOXYzine (ATARAX/VISTARIL) 10 MG tablet Take 10 mg by mouth 3 (three) times daily as needed.  . Multiple Vitamins-Minerals (ONE-A-DAY WOMENS 50 PLUS  PO) Take 1 tablet by mouth daily.   . Potassium Chloride ER 20 MEQ TBCR Take 2 tablets twice daily x 2 days then decrease to 2 tablets daily (Patient taking differently: Take 20 mEq by mouth daily. Take 2 tablets twice daily x 2 days then decrease to 2 tablets daily)  . predniSONE (DELTASONE) 5 MG tablet as directed.   Marland Kitchen Propylene Glycol 0.6 % SOLN Place 1 drop into both eyes at bedtime.   Past Medical History:  Diagnosis Date  . Atherosclerosis    mild cerebral  . Atrial fibrillation and flutter ([place])   . Autoimmune disorder ([place])   . Cervical disc disease    with spondylosis  . Cervical spondylosis   . Chronic dermatitis    eczematous  . Colitis   . DDD (degenerative disc disease), lumbar   . Frozen shoulder    left  . Hyperlipidemia   . Hypertension   . Lateral epicondylitis   . Lumbar spondylosis   . Movement disorder   . Multiple lesions on CT of brain and spine    reports lesions from previous CT scan at [facility]   . 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 CATARACT EXTRACTION    . COLONOSCOPY    . EYE SURGERY Left   . LOOP RECORDER INSERTION N/A 07/05/2017   Procedure: LOOP RECORDER INSERTION;  Surgeon: Thompson Grayer, MD;  Location: [place] [facility];  Service: Cardiovascular;  Laterality: N/A;  . [place]  Social History   Social History Narrative   Patient lives at home with her spouse.   Caffeine Use: 2-3 cups daily   family history includes Cancer in her maternal grandmother; Colon cancer (age of onset: 31) in her mother; Emphysema in her father; Heart attack (age of onset: 13) in her father; Stroke in her mother; Thrombosis in her maternal grandfather; Thyroid disease in her maternal grandmother and son.   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  There are some small anal tags.  There is normal to slightly decreased resting tone and decreased voluntary squeeze.  No rectocele or mass.  Abdominal contraction and simulated defecation shows appropriate descent and relaxation of abdominal contraction.  Ansocopy demonstrates grade 2 internal hemorrhoids with small external components in all 3 positions.

## 2019-03-03 ENCOUNTER — Ambulatory Visit: Payer: Medicare Other | Admitting: Internal Medicine

## 2019-03-03 ENCOUNTER — Other Ambulatory Visit: Payer: Self-pay

## 2019-03-03 ENCOUNTER — Encounter: Payer: Self-pay | Admitting: Internal Medicine

## 2019-03-03 VITALS — BP 132/78 | HR 76 | Temp 98.3°F | Ht 64.0 in | Wt 209.8 lb

## 2019-03-03 DIAGNOSIS — K641 Second degree hemorrhoids: Secondary | ICD-10-CM | POA: Insufficient documentation

## 2019-03-03 NOTE — Patient Instructions (Addendum)
HEMORRHOID BANDING PROCEDURE    FOLLOW-UP CARE   1. The procedure you have had should have been relatively painless since the banding of the area involved does not have nerve endings and there is no pain sensation.  The rubber band cuts off the blood supply to the hemorrhoid and the band may fall off as soon as 48 hours after the banding (the band may occasionally be seen in the toilet bowl following a bowel movement). You may notice a temporary feeling of fullness in the rectum which should respond adequately to plain Tylenol or Motrin.  2. Following the banding, avoid strenuous exercise that evening and resume full activity the next day.  A sitz bath (soaking in a warm tub) or bidet is soothing, and can be useful for cleansing the area after bowel movements.     3. To avoid constipation, take two tablespoons of natural wheat bran, natural oat bran, flax, Benefiber or any over the counter fiber supplement and increase your water intake to 7-8 glasses daily.    4. Unless you have been prescribed anorectal medication, do not put anything inside your rectum for two weeks: No suppositories, enemas, fingers, etc.  5. Occasionally, you may have more bleeding than usual after the banding procedure.  This is often from the untreated hemorrhoids rather than the treated one.  Don't be concerned if there is a tablespoon or so of blood.  If there is more blood than this, lie flat with your bottom higher than your head and apply an ice pack to the area. If the bleeding does not stop within a half an hour or if you feel faint, call our office at [phone] or go to the emergency room.  6. Problems are not common; however, if there is a substantial amount of bleeding, severe pain, chills, fever or difficulty passing urine (very rare) or other problems, you should call us at [phone] or report to the nearest emergency room.  7. Do not stay seated continuously for more than 2-3 hours for a day or two  after the procedure.  Tighten your buttock muscles 10-15 times every two hours and take 10-15 deep breaths every 1-2 hours.  Do not spend more than a few minutes on the toilet if you cannot empty your bowel; instead re-visit the toilet at a later time.    YOU WILL HAVE REPEAT BANDING APPOINTMENTS ON   November 24 AT 350  December 15 AT 310 PM   I appreciate the opportunity to care for you. Silvano Rusk, MD, [facility]

## 2019-03-03 NOTE — Assessment & Plan Note (Signed)
Right anterior banded return to clinic in 2 weeks to repeat banding continue Eliquis

## 2019-03-03 NOTE — Progress Notes (Signed)
    HEMORRHOID LIGATION  Signs or symptoms are rectal bleeding itching and burning in the anus.  Fecal smearing.  Hemorrhoids were diagnosed at previous anoscopy on 02/06/2019.  The patient takes Eliquis for atrial fibrillation and the decision to band while on Eliquis has been made.  June McMurray, CMA present PROCEDURE NOTE: The patient presents with symptomatic grade 2 hemorrhoids, requesting rubber band ligation of his/her hemorrhoidal disease.  All risks, benefits and alternative forms of therapy were described and informed consent was obtained.   The anorectum was pre-medicated with 0.125% nitroglycerin and 5% lidocaine The decision was made to band the right anterior internal hemorrhoid, and the [place] was used to perform band ligation without complication.  Digital anorectal examination was then performed to assure proper positioning of the band, and to adjust the banded tissue as required.  The patient was discharged home without pain or other issues.  Dietary and behavioral recommendations were given and along with follow-up instructions.      The patient will return in 2 weeks for  follow-up and possible additional banding as required. No complications were encountered and the patient tolerated the procedure well.  I appreciate the opportunity to care for this patient.  [id number], Caren Griffins, MD

## 2019-03-08 ENCOUNTER — Encounter: Payer: Self-pay | Admitting: Internal Medicine

## 2019-03-17 ENCOUNTER — Encounter: Payer: Medicare Other | Admitting: Internal Medicine

## 2019-04-07 ENCOUNTER — Encounter: Payer: Self-pay | Admitting: Internal Medicine

## 2019-04-07 ENCOUNTER — Ambulatory Visit: Payer: Medicare Other | Admitting: Internal Medicine

## 2019-04-07 DIAGNOSIS — K641 Second degree hemorrhoids: Secondary | ICD-10-CM | POA: Diagnosis not present

## 2019-04-07 NOTE — Patient Instructions (Addendum)
HEMORRHOID BANDING PROCEDURE    FOLLOW-UP CARE   1. The procedure you have had should have been relatively painless since the banding of the area involved does not have nerve endings and there is no pain sensation.  The rubber band cuts off the blood supply to the hemorrhoid and the band may fall off as soon as 48 hours after the banding (the band may occasionally be seen in the toilet bowl following a bowel movement). You may notice a temporary feeling of fullness in the rectum which should respond adequately to plain Tylenol or Motrin.  2. Following the banding, avoid strenuous exercise that evening and resume full activity the next day.  A sitz bath (soaking in a warm tub) or bidet is soothing, and can be useful for cleansing the area after bowel movements.     3. To avoid constipation, take two tablespoons of natural wheat bran, natural oat bran, flax, Benefiber or any over the counter fiber supplement and increase your water intake to 7-8 glasses daily.    4. Unless you have been prescribed anorectal medication, do not put anything inside your rectum for two weeks: No suppositories, enemas, fingers, etc.  5. Occasionally, you may have more bleeding than usual after the banding procedure.  This is often from the untreated hemorrhoids rather than the treated one.  Don't be concerned if there is a tablespoon or so of blood.  If there is more blood than this, lie flat with your bottom higher than your head and apply an ice pack to the area. If the bleeding does not stop within a half an hour or if you feel faint, call our office at [phone] or go to the emergency room.  6. Problems are not common; however, if there is a substantial amount of bleeding, severe pain, chills, fever or difficulty passing urine (very rare) or other problems, you should call us at [phone] or report to the nearest emergency room.  7. Do not stay seated continuously for more than 2-3 hours for a day or two  after the procedure.  Tighten your buttock muscles 10-15 times every two hours and take 10-15 deep breaths every 1-2 hours.  Do not spend more than a few minutes on the toilet if you cannot empty your bowel; instead re-visit the toilet at a later time.    We will see you again 04/21/2019 at 3:50pm for additional bandings.   I appreciate the opportunity to care for you. Silvano Rusk, MD, [facility]

## 2019-04-07 NOTE — Assessment & Plan Note (Signed)
Banded RP

## 2019-04-13 NOTE — Progress Notes (Signed)
    HEMORRHOID LIGATION:  Second ligation s/p RA banding 03/03/19  PROCEDURE NOTE: The patient presents with symptomatic grade 2  hemorrhoids, requesting rubber band ligation of his/her hemorrhoidal disease.  All risks, benefits and alternative forms of therapy were described and informed consent was obtained.   The anorectum was pre-medicated with NTG and lidoacine The decision was made to band the RP internal hemorrhoid, and the [place] was used to perform band ligation without complication.  Digital anorectal examination was then performed to assure proper positioning of the band, and to adjust the banded tissue as required.  The patient was discharged home without pain or other issues.  Dietary and behavioral recommendations were given and along with follow-up instructions.      The patient will return in 2-3 weeks for  follow-up and possible additional banding as required. No complications were encountered and the patient tolerated the procedure well.

## 2019-04-21 ENCOUNTER — Ambulatory Visit (INDEPENDENT_AMBULATORY_CARE_PROVIDER_SITE_OTHER): Payer: Medicare Other | Admitting: Internal Medicine

## 2019-04-21 ENCOUNTER — Encounter: Payer: Self-pay | Admitting: Internal Medicine

## 2019-04-21 DIAGNOSIS — K641 Second degree hemorrhoids: Secondary | ICD-10-CM

## 2019-04-21 NOTE — Patient Instructions (Addendum)
HEMORRHOID BANDING PROCEDURE    FOLLOW-UP CARE   1. The procedure you have had should have been relatively painless since the banding of the area involved does not have nerve endings and there is no pain sensation.  The rubber band cuts off the blood supply to the hemorrhoid and the band may fall off as soon as 48 hours after the banding (the band may occasionally be seen in the toilet bowl following a bowel movement). You may notice a temporary feeling of fullness in the rectum which should respond adequately to plain Tylenol or Motrin.  2. Following the banding, avoid strenuous exercise that evening and resume full activity the next day.  A sitz bath (soaking in a warm tub) or bidet is soothing, and can be useful for cleansing the area after bowel movements.     3. To avoid constipation, take two tablespoons of natural wheat bran, natural oat bran, flax, Benefiber or any over the counter fiber supplement and increase your water intake to 7-8 glasses daily.    4. Unless you have been prescribed anorectal medication, do not put anything inside your rectum for two weeks: No suppositories, enemas, fingers, etc.  5. Occasionally, you may have more bleeding than usual after the banding procedure.  This is often from the untreated hemorrhoids rather than the treated one.  Don't be concerned if there is a tablespoon or so of blood.  If there is more blood than this, lie flat with your bottom higher than your head and apply an ice pack to the area. If the bleeding does not stop within a half an hour or if you feel faint, call our office at [phone] or go to the emergency room.  6. Problems are not common; however, if there is a substantial amount of bleeding, severe pain, chills, fever or difficulty passing urine (very rare) or other problems, you should call us at [phone] or report to the nearest emergency room.  7. Do not stay seated continuously for more than 2-3 hours for a day or two  after the procedure.  Tighten your buttock muscles 10-15 times every two hours and take 10-15 deep breaths every 1-2 hours.  Do not spend more than a few minutes on the toilet if you cannot empty your bowel; instead re-visit the toilet at a later time.    Give the banding 2 months and see how your doing.   Some options to consider are pelvic floor P.T. or SIBO testing.   I appreciate the opportunity to care for you. Silvano Rusk, MD, [facility]

## 2019-04-21 NOTE — Progress Notes (Signed)
   HEMORRHOID LIGATION  Third banding  Still w/ some leakage of gas and fecal smearing   PROCEDURE NOTE: The patient presents with symptomatic grade 2  hemorrhoids, requesting rubber band ligation of his/her hemorrhoidal disease.  All risks, benefits and alternative forms of therapy were described and informed consent was obtained.   The anorectum was pre-medicated with 0.125% NTG and 5% lidocaine The decision was made to band the LL internal hemorrhoid, and the [place] was used to perform band ligation without complication.  Digital anorectal examination was then performed to assure proper positioning of the band, and to adjust the banded tissue as required.  The patient was discharged home without pain or other issues.  Dietary and behavioral recommendations were given and along with follow-up instructions.      No complications were encountered and the patient tolerated the procedure well.   Prolapsed internal hemorrhoids, grade 2 LL banded today She will give it about 2 months to see if gas leakage and fecal smearing is any better I did mention possibility of SIBO testing though it does not sem like she has excessive gas but is bothered by the leakage that she cannot control and it is socially unacceptable.  Also raised possibility of pelvic floor PT and she indicated she thought she could "do that myself"  She does not have any urinary sxs so that may not be the answer but would probably be what I tried next.  I did explain that beyond these options doubt I have much else to offer re: passage of flatus unintentionally   Cc:[name], MD

## 2019-04-21 NOTE — Assessment & Plan Note (Addendum)
LL banded today She will give it about 2 months to see if gas leakage and fecal smearing is any better I did mention possibility of SIBO testing though it does not sem like she has excessive gas but is bothered by the leakage that she cannot control and it is socially unacceptable.  Also raised possibility of pelvic floor PT and she indicated she thought she could "do that myself"  She does not have any urinary sxs so that may not be the answer but would probably be what I tried next.  I did explain that beyond these options doubt I have much else to offer re: passage of flatus unintentionally

## 2019-04-30 ENCOUNTER — Other Ambulatory Visit: Payer: Self-pay

## 2019-04-30 ENCOUNTER — Encounter: Payer: Self-pay | Admitting: Orthopaedic Surgery

## 2019-04-30 ENCOUNTER — Ambulatory Visit: Payer: Medicare Other | Admitting: Orthopaedic Surgery

## 2019-04-30 DIAGNOSIS — M7632 Iliotibial band syndrome, left leg: Secondary | ICD-10-CM | POA: Diagnosis not present

## 2019-04-30 DIAGNOSIS — M461 Sacroiliitis, not elsewhere classified: Secondary | ICD-10-CM | POA: Diagnosis not present

## 2019-04-30 DIAGNOSIS — M25552 Pain in left hip: Secondary | ICD-10-CM

## 2019-04-30 NOTE — Progress Notes (Signed)
Office Visit Note   Patient: Lindsay Clark           Date of Birth: 28-Jul-1940           MRN: [id number] Visit Date: 04/30/2019              Requested by: [name], MD [place],   [id number] PCP: [name], MD   Assessment & Plan: Visit Diagnoses:  1. Sacroiliitis, not elsewhere classified ([place])   2. It band syndrome, left     Plan: I would like to send her to outpatient physical therapy to work on any modalities that can decrease the right-sided low back pain as well as her IT band pain.  I would also like to send her to Dr. Ernestina Patches to consider a right-sided L5-S1 injection of the facet joint versus the right side SI joint.  I want her to try topical Voltaren gel on this area of her lower back and her knees.  All question concerns were answered addressed.  I will see her back myself in 6 weeks and hopefully by then she will been through physical therapy and had an intervention by Dr. Ernestina Patches.  Follow-Up Instructions: Return in about 6 weeks (around 06/11/2019).   Orders:  No orders of the defined types were placed in this encounter.  No orders of the defined types were placed in this encounter.     Procedures: No procedures performed   Clinical Data: No additional findings.   Subjective: No chief complaint on file. The patient is well-known to me.  She comes with a chief complaint today of bilateral knee pain and stiffness and she points to the IT band more on the left than the right.  She is also had severe left-sided low back pain and more of in the upper pelvic and SI joint area is where she points.  She is been on several rounds of steroid tapers.  She had a previous MRI of her lumbar spine it does show severe stenosis.  There is some facet arthritis at L5-S1 as well.  This is been frustrating for her.  She feels like she is having a harder time getting up out of chairs.  She is very active 79 years old.  Her husband is a patient  of ours as well.  He is unfortunately had severe cardiac issues over the last few weeks and this required extensive hospitalization and surgeries.  HPI  Review of Systems She currently denies any headache, chest pain, shortness of breath, fever, chills, nausea, vomiting  Objective: Vital Signs: There were no vitals taken for this visit.  Physical Exam She is alert and orient x3 and in no acute distress Ortho Exam Examination of both knees shows no effusion with excellent range of motion.  Both knees have significant pain over the distal IT band mainly more so on the left side of the fibular head but they are both tender.  On examination of her back she does have point specific pain to the lower lumbar spine to the right as well as the SI joint and upper pelvis to the right side. Specialty Comments:  No specialty comments available.  Imaging: No results found.   PMFS History: Patient Active Problem List   Diagnosis Date Noted   Prolapsed internal hemorrhoids, grade 2 03/03/2019   Brain lesion 06/24/2017   Osteoarthritis    Multiple lesions on CT of brain and spine    Movement disorder  Lumbar spondylosis    Hyperlipidemia    DDD (degenerative disc disease), lumbar    Colitis    Dermatitis    Cervical spondylosis    Cervical disc disease    Diastolic dysfunction 0000000   Syncope 04/18/2016   Anxiety 04/18/2016   Hypertension    Autoimmune disorder-autoimmune encephalitis    Dystonia 03/16/2015   Coronary artery calcification of native artery 11/04/2014   Abnormal finding on MRI of brain 10/04/2014   Pruritus [id number]   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   Past Medical History:  Diagnosis Date   Atherosclerosis    mild cerebral   Atrial fibrillation and flutter ([facility])    Autoimmune disorder ([facility])    Cervical disc disease    with spondylosis   Cervical spondylosis    Chronic dermatitis    eczematous   Colitis    DDD  (degenerative disc disease), lumbar    Frozen shoulder    left   Hyperlipidemia    Hypertension    Lateral epicondylitis    Lumbar spondylosis    Movement disorder    Multiple lesions on CT of brain and spine    reports lesions from previous CT scan at [facility]    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    Family History  Problem Relation Age of Onset   Stroke Mother    Colon cancer Mother 32   Heart attack Father 70   Emphysema Father    Thyroid disease Son    Thyroid disease Maternal Grandmother    Cancer Maternal Grandmother        cervical    Thrombosis Maternal Grandfather     Past Surgical History:  Procedure Laterality Date   CATARACT EXTRACTION     COLONOSCOPY     EYE SURGERY Left    HEMORRHOID BANDING     LOOP RECORDER INSERTION N/A 07/05/2017   Procedure: LOOP RECORDER INSERTION;  Surgeon: Thompson Grayer, MD;  Location: [place] [facility];  Service: Cardiovascular;  Laterality: N/A;   TUBAL LIGATION  1980   Social History   Occupational History   Occupation: Retired    Fish farm manager: OTHER  Tobacco Use   Smoking status: Never Smoker   Smokeless tobacco: Never Used  Substance and Sexual Activity   Alcohol use: Yes    Alcohol/week: 0.0 standard drinks    Comment: 4 glasses of wine weekly   Drug use: No   Sexual activity: Not Currently

## 2019-05-01 ENCOUNTER — Telehealth: Payer: Self-pay | Admitting: *Deleted

## 2019-05-01 NOTE — Telephone Encounter (Signed)
Hold apixaban 2 days prior to procedure and resume after when ok with physician doing injection Kirk Ruths

## 2019-05-04 NOTE — Telephone Encounter (Signed)
Pt is schedule for 05/14/2019, and agreed to hold BT per Dr. [name] 2 days prior to 05/14/19. Pt will also have a driver.

## 2019-05-07 NOTE — Progress Notes (Signed)
HPI: FUPAF. 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 Patient seen in September 2017with complaints of palpitations and near syncope. Patient was scheduled for an exercise treadmill and when she came she was noted to have runs of paroxysmal atrial tachycardia/fibrillation. Procedure was canceled.Monitor October 2018 showed paroxysmal atririllation and flutter. Nuclear study October 2018 showed ejection fraction 67%, apical thinning but no ischemia.Echocardiogram October 2018 showed normal LV systolic function and mild diastolic dysfunction.Placed on apixaban.Had implantable loop recorder placed by Dr. Rayann Heman for syncope and recurrent palpitations. She has been found to have "heart pounding" episodes not related to arrhythmia. Since last seen,she notes worsening dyspnea on exertion.  No orthopnea, PND, pedal edema, chest pain or syncope.  Current Outpatient Medications  Medication Sig Dispense Refill  . diltiazem (CARDIZEM CD) 240 MG 24 hr capsule TAKE 1 CAPSULE EVERY DAY. 90 capsule 2  . ELIQUIS 5 MG TABS tablet TAKE 1 TABLET BY MOUTH TWICE DAILY. 60 tablet 9  . furosemide (LASIX) 40 MG tablet Take 1 tablet (40 mg total) by mouth daily. 90 tablet 3  . hydroxychloroquine (PLAQUENIL) 200 MG tablet Take 200 mg by mouth 2 (two) times daily.    . hydrOXYzine (ATARAX/VISTARIL) 10 MG tablet Take 10 mg by mouth 3 (three) times daily as needed.    . Multiple Vitamins-Minerals (ONE-A-DAY WOMENS 50 PLUS PO) Take 1 tablet by mouth daily.     . Potassium Chloride ER 20 MEQ TBCR Take 2 tablets twice daily x 2 days then decrease to 2 tablets daily (Patient taking differently: Take 20 mEq by mouth daily. Take 2 tablets twice daily x 2 days then decrease to 2 tablets daily) 68 tablet 6  . Propylene Glycol 0.6 % SOLN Place 1 drop into both eyes at bedtime.     No current facility-administered medications for this visit.     Past Medical History:  Diagnosis Date  .  Atherosclerosis    mild cerebral  . Atrial fibrillation and flutter ([place])   . Autoimmune disorder ([place])   . Cervical disc disease    with spondylosis  . Cervical spondylosis   . Chronic dermatitis    eczematous  . Colitis   . DDD (degenerative disc disease), lumbar   . Frozen shoulder    left  . Hyperlipidemia   . Hypertension   . Lateral epicondylitis   . Lumbar spondylosis   . Movement disorder   . Multiple lesions on CT of brain and spine    reports lesions from previous CT [facility]   . 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 CATARACT EXTRACTION    . COLONOSCOPY    . EYE SURGERY Left   . HEMORRHOID BANDING    . LOOP RECORDER INSERTION N/A 07/05/2017   Procedure: LOOP RECORDER INSERTION;  Surgeon: Thompson Grayer, MD;  Location: [place] [facility];  Service: Cardiovascular;  Laterality: N/A;  . TUBAL LIGATION  1980    Social History   Socioeconomic History  . Marital status: Married    Spouse name: [name]  . Number of children: 2  . Years of education: MAx2  . Highest education level: Not on file  Occupational History  . Occupation: Retired    Fish farm manager: OTHER  Tobacco Use  . Smoking status: Never Smoker  . Smokeless tobacco: Never Used  Substance and Sexual Activity  . Alcohol use: Yes    Alcohol/week: 0.0 standard  drinks    Comment: 4 glasses of wine weekly  . Drug use: No  . Sexual activity: Not Currently  Other Topics Concern  . Not on file  Social History Narrative   Patient lives at home with her spouse.   Caffeine Use: 2-3 cups daily   Social Determinants of Health   Financial Resource Strain:   . Difficulty of Paying Living Expenses: Not on file  Food Insecurity:   . Worried About Charity fundraiser in the Last Year: Not on file  . Ran Out of Food in the Last Year: Not on file  Transportation Needs:   . Lack of Transportation (Medical): Not on file  . Lack of Transportation (Non-Medical):  Not on file  Physical Activity:   . Days of Exercise per Week: Not on file  . Minutes of Exercise per Session: Not on file  Stress:   . Feeling of Stress : Not on file  Social Connections:   . Frequency of Communication with Friends and Family: Not on file  . Frequency of Social Gatherings with Friends and Family: Not on file  . Attends Religious Services: Not on file  . Active Member of Clubs or Organizations: Not on file  . Attends Archivist Meetings: Not on file  . Marital Status: Not on file  Intimate Partner Violence:   . Fear of Current or Ex-Partner: Not on file  . Emotionally Abused: Not on file  . Physically Abused: Not on file  . Sexually Abused: Not on file    Family History  Problem Relation Age of Onset  . Stroke Mother   . Colon cancer Mother 65  . Heart attack Father 68  . Emphysema Father   . Thyroid disease Son   . Thyroid disease Maternal Grandmother   . Cancer Maternal Grandmother        cervical   . Thrombosis Maternal Grandfather     ROS: no fevers or chills, productive cough, hemoptysis, dysphasia, odynophagia, melena, hematochezia, dysuria, hematuria, rash, seizure activity, orthopnea, PND, pedal edema, claudication. Remaining systems are negative.  Physical Exam: Well-developed well-nourished in no acute distress.  Skin is warm and dry.  HEENT is normal.  Neck is supple.  Chest is clear to auscultation with normal expansion.  Cardiovascular exam is regular rate and rhythm.  Abdominal exam nontender or distended. No masses palpated. Extremities show no edema. neuro grossly intact  ECG-normal sinus rhythm at a rate of 78, prolonged QT interval.  Personally reviewed  A/P  1 paroxysmal atrial fibrillation-patient remains in sinus rhythm today.  Continue Cardizem at present dose.  Continue apixaban.  We can consider addition of an antiarrhythmic in the future if she has more frequent episodes.  Check hemoglobin and renal function.  2  hypertension-blood pressure controlled.  Continue present medications and follow.  3 history of syncope-implantable loop recorder in place.  She has had no further episodes since last office visit.  4 pedal edema-continue present dose of diuretic.  5 dyspnea on exertion-she is concerned about the possibility of coronary disease.  She has a strong family history.  I will arrange a cardiac CTA to rule out coronary disease causing an anginal equivalent.  Schedule echocardiogram to assess LV function.  Check BNP.  Kirk Ruths, MD

## 2019-05-11 ENCOUNTER — Telehealth: Payer: Self-pay | Admitting: Physical Medicine and Rehabilitation

## 2019-05-11 NOTE — Telephone Encounter (Signed)
Patient called advised she would like to cancel her appointment. Patient said she left a message last week to cancel her appointment.  Patient will not reschedule at this time.

## 2019-05-11 NOTE — Telephone Encounter (Signed)
Appointment cancelled

## 2019-05-12 ENCOUNTER — Ambulatory Visit: Payer: Medicare Other | Admitting: Cardiology

## 2019-05-13 ENCOUNTER — Encounter: Payer: Self-pay | Admitting: Cardiology

## 2019-05-13 ENCOUNTER — Ambulatory Visit: Payer: Medicare PPO | Admitting: Cardiology

## 2019-05-13 ENCOUNTER — Other Ambulatory Visit: Payer: Self-pay

## 2019-05-13 VITALS — BP 132/86 | HR 87 | Temp 97.2°F | Ht 64.0 in | Wt 209.0 lb

## 2019-05-13 DIAGNOSIS — R0600 Dyspnea, unspecified: Secondary | ICD-10-CM

## 2019-05-13 DIAGNOSIS — I480 Paroxysmal atrial fibrillation: Secondary | ICD-10-CM

## 2019-05-13 DIAGNOSIS — R0609 Other forms of dyspnea: Secondary | ICD-10-CM

## 2019-05-13 DIAGNOSIS — I10 Essential (primary) hypertension: Secondary | ICD-10-CM | POA: Diagnosis not present

## 2019-05-13 MED ORDER — METOPROLOL TARTRATE 50 MG PO TABS: ORAL_TABLET | ORAL | 0 refills | Status: DC

## 2019-05-13 NOTE — Patient Instructions (Signed)
Medication Instructions:  NO CHANGE *If you need a refill on your cardiac medications before your next appointment, please call your pharmacy*  Lab Work: Your physician recommends that you HAVE LAB WORK TODAY If you have labs (blood work) drawn today and your tests are completely normal, you will receive your results only by: Marland Kitchen MyChart Message (if you have MyChart) OR . A paper copy in the mail If you have any lab test that is abnormal or we need to change your treatment, we will call you to review the results.  Testing/Procedures: Your physician has requested that you have an echocardiogram. Echocardiography is a painless test that uses sound waves to create images of your heart. It provides your doctor with information about the size and shape of your heart and how well your heart's chambers and valves are working. This procedure takes approximately one hour. There are no restrictions for this procedure.[place]   Your cardiac CT will be scheduled at one of the below locations:   [facility] [address] [place], [place] [address] [phone]  If scheduled at [facility], please arrive at the [facility] main entrance of [facility] 30-45 minutes prior to test start time. Proceed to the [facility] Radiology Department (first floor) to check-in and test prep.  Please follow these instructions carefully (unless otherwise directed):  Hold all erectile dysfunction medications at least 3 days (72 hrs) prior to test.  On the Night Before the Test: . Be sure to Drink plenty of water. . Do not consume any caffeinated/decaffeinated beverages or chocolate 12 hours prior to your test. . Do not take any antihistamines 12 hours prior to your test. . If the patient has contrast allergy: ? Patient will need a prescription for Prednisone and very clear instructions (as follows):  On the Day of the Test: . Drink plenty of water. Do not drink any  water within one hour of the test. . Do not eat any food 4 hours prior to the test. . You may take your regular medications prior to the test.  . Take metoprolol (Lopressor) 50 MG two hours prior to test. . HOLD Furosemide/Hydrochlorothiazide morning of the test. . FEMALES- please wear underwire-free bra if available   After the Test: . Drink plenty of water. . After receiving IV contrast, you may experience a mild flushed feeling. This is normal. . On occasion, you may experience a mild rash up to 24 hours after the test. This is not dangerous. If this occurs, you can take Benadryl 25 mg and increase your fluid intake. . If you experience trouble breathing, this can be serious. If it is severe call 911 IMMEDIATELY. If it is mild, please call our office. . If you take any of these medications: Glipizide/Metformin, Avandament, Glucavance, please do not take 48 hours after completing test unless otherwise instructed.   Once we have confirmed authorization from your insurance company, we will call you to set up a date and time for your test.   For non-scheduling related questions, please contact the cardiac imaging nurse navigator should you have any questions/concerns: Marchia Bond, RN Navigator Cardiac Imaging [facility] [phone] Office     Follow-Up: At [facility], you and your health needs are our priority.  As part of our continuing mission to provide you with exceptional heart care, we have created designated Provider Care Teams.  These Care Teams include your primary Cardiologist (physician) and  Advanced Practice Providers (APPs -  Physician Assistants and Nurse Practitioners) who all work together to provide you with the care you need, when you need it.  Your next appointment:   6 month(s)  The format for your next appointment:   Either In Person or Virtual  Provider:   You may see Kirk Ruths, MD or one of the following Advanced Practice  Providers on your designated Care Team:    Kerin Ransom, PA-C  [place], [place]  Coletta Memos, [place]

## 2019-05-14 ENCOUNTER — Encounter: Payer: Medicare Other | Admitting: Physical Medicine and Rehabilitation

## 2019-05-14 LAB — CBC
Hematocrit: 42.7 % (ref 34.0–46.6)
Hemoglobin: 14.5 g/dL (ref 11.1–15.9)
MCH: 30.9 pg (ref 26.6–33.0)
MCHC: 34 g/dL (ref 31.5–35.7)
MCV: 91 fL (ref 79–97)
Platelets: 194 10*3/uL (ref 150–450)
RBC: 4.7 x10E6/uL (ref 3.77–5.28)
RDW: 12.9 % (ref 11.7–15.4)
WBC: 6.5 10*3/uL (ref 3.4–10.8)

## 2019-05-14 LAB — BASIC METABOLIC PANEL
BUN/Creatinine Ratio: 21 (ref 12–28)
BUN: 15 mg/dL (ref 8–27)
CO2: 25 mmol/L (ref 20–29)
Calcium: 9 mg/dL (ref 8.7–10.3)
Chloride: 100 mmol/L (ref 96–106)
Creatinine, Ser: 0.71 mg/dL (ref 0.57–1.00)
GFR calc Af Amer: 96 mL/min/{1.73_m2} (ref >59)
GFR calc non Af Amer: 83 mL/min/{1.73_m2} (ref >59)
Glucose: 86 mg/dL (ref 65–99)
Potassium: 3.9 mmol/L (ref 3.5–5.2)
Sodium: 142 mmol/L (ref 134–144)

## 2019-05-14 LAB — PRO B NATRIURETIC PEPTIDE: NT-Pro BNP: 270 pg/mL (ref 0–738)

## 2019-05-19 ENCOUNTER — Encounter: Payer: Self-pay | Admitting: *Deleted

## 2019-05-19 ENCOUNTER — Telehealth: Payer: Self-pay | Admitting: Cardiology

## 2019-05-19 ENCOUNTER — Ambulatory Visit: Payer: Medicare Other

## 2019-05-19 NOTE — Telephone Encounter (Signed)
Will route to [facility]

## 2019-05-19 NOTE — Telephone Encounter (Signed)
Lonn Georgia, with [employer], and patient are calling on 3-way call stating that they are  requesting prior authorization for CT. Lonn Georgia states that the prior authorization can be faxed to [employer] at [phone]. Please call patient to clarify. Patient states that a detailed voice message can be left if the office does not receive an answer.

## 2019-05-25 ENCOUNTER — Ambulatory Visit (HOSPITAL_COMMUNITY): Payer: Medicare PPO | Attending: Cardiology

## 2019-05-25 ENCOUNTER — Other Ambulatory Visit: Payer: Self-pay

## 2019-05-25 DIAGNOSIS — R0600 Dyspnea, unspecified: Secondary | ICD-10-CM

## 2019-05-25 DIAGNOSIS — R0609 Other forms of dyspnea: Secondary | ICD-10-CM

## 2019-05-27 ENCOUNTER — Other Ambulatory Visit: Payer: Self-pay | Admitting: Cardiology

## 2019-06-02 ENCOUNTER — Telehealth (HOSPITAL_COMMUNITY): Payer: Self-pay | Admitting: Emergency Medicine

## 2019-06-02 ENCOUNTER — Encounter (HOSPITAL_COMMUNITY): Payer: Self-pay

## 2019-06-02 NOTE — Telephone Encounter (Signed)
Reaching out to patient to offer assistance regarding upcoming cardiac imaging study; pt verbalizes understanding of appt date/time, parking situation and where to check in, pre-test NPO status and medications ordered, and verified current allergies; name and call back number provided for further questions should they arise Ericberto Padget RN Navigator Cardiac Imaging [facility] [phone] office [phone] cell 

## 2019-06-03 ENCOUNTER — Ambulatory Visit (HOSPITAL_COMMUNITY)
Admission: RE | Admit: 2019-06-03 | Discharge: 2019-06-03 | Disposition: A | Discharge status: Home / Self Care | Payer: Medicare PPO | Source: Ambulatory Visit | Attending: Cardiology | Admitting: Cardiology

## 2019-06-03 ENCOUNTER — Other Ambulatory Visit: Payer: Self-pay

## 2019-06-03 DIAGNOSIS — I700 Atherosclerosis of aorta: Secondary | ICD-10-CM | POA: Insufficient documentation

## 2019-06-03 DIAGNOSIS — R0600 Dyspnea, unspecified: Secondary | ICD-10-CM | POA: Insufficient documentation

## 2019-06-03 DIAGNOSIS — R0609 Other forms of dyspnea: Secondary | ICD-10-CM

## 2019-06-03 MED ORDER — NITROGLYCERIN 0.4 MG SL SUBL
SUBLINGUAL_TABLET | SUBLINGUAL | Status: AC
  Filled 2019-06-03: qty 2

## 2019-06-03 MED ORDER — NITROGLYCERIN 0.4 MG SL SUBL: 0.8000 mg | SUBLINGUAL_TABLET | Freq: Once | SUBLINGUAL | Status: DC

## 2019-06-03 MED ORDER — IOHEXOL 350 MG/ML SOLN
100.0000 mL | Freq: Once | INTRAVENOUS | Status: AC | PRN
  Administered 2019-06-03: 100 mL via INTRAVENOUS

## 2019-06-03 NOTE — Progress Notes (Signed)
Has frequent PAC's. Dr Radford Pax aware. To do retrospective scan instead. CT tech [place] aware.

## 2019-06-08 ENCOUNTER — Other Ambulatory Visit: Payer: Self-pay

## 2019-06-08 ENCOUNTER — Ambulatory Visit: Payer: Medicare PPO | Admitting: Physician Assistant

## 2019-06-08 ENCOUNTER — Ambulatory Visit: Payer: Self-pay

## 2019-06-08 ENCOUNTER — Encounter: Payer: Self-pay | Admitting: Physician Assistant

## 2019-06-08 DIAGNOSIS — M25562 Pain in left knee: Secondary | ICD-10-CM | POA: Diagnosis not present

## 2019-06-08 MED ORDER — LIDOCAINE HCL 1 % IJ SOLN
5.0000 mL | INTRAMUSCULAR | Status: AC | PRN
  Administered 2019-06-08: 5 mL

## 2019-06-08 MED ORDER — METHYLPREDNISOLONE ACETATE 40 MG/ML IJ SUSP
40.0000 mg | INTRAMUSCULAR | Status: AC | PRN
  Administered 2019-06-08: 16:00:00 40 mg via INTRA_ARTICULAR

## 2019-06-08 NOTE — Progress Notes (Signed)
Office Visit Note   Patient: Lindsay Clark           Date of Birth: [birth year]           MRN: [id number] Visit Date: 06/08/2019              Requested by: Lindsay Stains, MD [place],  [place] [address] PCP: Lindsay Stains, MD   Assessment & Plan: Visit Diagnoses:  1. Left knee pain, unspecified chronicity     Plan: We will see her back in 2 weeks to see what type of response she had to the aspiration injection left knee.  She may require an MRI meniscal tear if she continues to have pain or recurrent effusion of the knee.  Follow-Up Instructions: Return in about 2 weeks (around 06/22/2019).   Orders:  Orders Placed This Encounter  Procedures  . Large Joint Inj  . XR Knee 1-2 Views Left   No orders of the defined types were placed in this encounter.     Procedures: Large Joint Inj: L knee on 06/08/2019 3:43 PM Indications: pain Details: 22 G 1.5 in needle, anterolateral approach  Arthrogram: No  Medications: 40 mg methylPREDNISolone acetate 40 MG/ML; 5 mL lidocaine 1 % Aspirate: 25 mL yellow Outcome: tolerated well, no immediate complications Procedure, treatment alternatives, risks and benefits explained, specific risks discussed. Consent was given by the patient. Immediately prior to procedure a time out was called to verify the correct patient, procedure, equipment, support staff and site/side marked as required. Patient was prepped and draped in the usual sterile fashion.       Clinical Data: No additional findings.   Subjective: Chief Complaint  Patient presents with  . Left Knee - Pain    HPI Lindsay Clark comes in today due to left knee pain.  States pain is lateral posterior aspect of the left knee.  Radiates up and down the back of the leg.  She has trouble getting up and down from a seated position due to the pain in the knee.  Describes no true mechanical symptoms of the knee.  Discharge pain is a sharp shooting pain in the  knee times.  Has had injections in the knee in the past is been very helpful.  She has tried Voltaren gel knee without any real relief. Review of Systems   Objective: Vital Signs: There were no vitals taken for this visit.  Physical Exam Constitutional:      Appearance: She is not ill-appearing or diaphoretic.  Pulmonary:     Effort: Pulmonary effort is normal.  Neurological:     Mental Status: She is alert and oriented to person, place, and time.  Psychiatric:        Mood and Affect: Mood normal.     Ortho Exam Bilateral knees good range of motion.  Left knee positive effusion no abnormal warmth erythema.  No instability valgus varus stressing of either knee.  Tenderness along medial lateral joint line of the left knee only.  McMurray's is positive left knee negative right. Specialty Comments:  No specialty comments available.  Imaging: AP lateral views left knee: No acute fractures.  Calcification of the meniscus is seen throughout.  Mild patellofemoral changes.  Otherwise medial lateral joint lines well-maintained.  PMFS History: Patient Active Problem List   Diagnosis Date Noted  . Prolapsed internal hemorrhoids, grade 2 03/03/2019  . Brain lesion 06/24/2017  . Osteoarthritis   . Multiple lesions on CT of  brain and spine   . Movement disorder   . Lumbar spondylosis   . Hyperlipidemia   . DDD (degenerative disc disease), lumbar   . Colitis   . Dermatitis   . Cervical spondylosis   . Cervical disc disease   . Diastolic dysfunction 0000000  . Syncope 04/18/2016  . Anxiety 04/18/2016  . Hypertension   . Autoimmune disorder-autoimmune encephalitis   . Dystonia 03/16/2015  . Coronary artery calcification of native artery 11/04/2014  . Abnormal finding on MRI of brain 10/04/2014  . Pruritus 11/30/2011  . 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 Atherosclerosis    mild cerebral  . Atrial fibrillation and flutter ([place])   .  Autoimmune disorder ([place])   . Cervical disc disease    with spondylosis  . Cervical spondylosis   . Chronic dermatitis    eczematous  . Colitis   . DDD (degenerative disc disease), lumbar   . Frozen shoulder    left  . Hyperlipidemia   . Hypertension   . Lateral epicondylitis   . Lumbar spondylosis   . Movement disorder   . Multiple lesions on CT of brain and spine    reports lesions from [facility]   . 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    Family History  Problem Relation Age of Onset  . Stroke Mother   . Colon cancer Mother 75  . Heart attack Father 6  . Emphysema Father   . Thyroid disease Son   . Thyroid disease Maternal Grandmother   . Cancer Maternal Grandmother        cervical   . Thrombosis Maternal Grandfather     Past Surgical History:  Procedure Laterality Date  . CATARACT EXTRACTION    . COLONOSCOPY    . EYE SURGERY Left   . HEMORRHOID BANDING    . LOOP RECORDER INSERTION N/A 07/05/2017   Procedure: LOOP RECORDER INSERTION;  Surgeon: Thompson Grayer, MD;  Location: [place] [facility];  Service: Cardiovascular;  Laterality: N/A;  . TUBAL LIGATION  1980   Social History   Occupational History  . Occupation: Retired    Fish farm manager: OTHER  Tobacco Use  . Smoking status: Never Smoker  . Smokeless tobacco: Never Used  Substance and Sexual Activity  . Alcohol use: Yes    Alcohol/week: 0.0 standard drinks    Comment: 4 glasses of wine weekly  . Drug use: No  . Sexual activity: Not Currently

## 2019-06-09 ENCOUNTER — Telehealth: Payer: Self-pay | Admitting: Cardiology

## 2019-06-09 NOTE — Telephone Encounter (Signed)
New Message    Pt states she had a CT last Wednesday. She says the tech told her if she had not heard anything by Monday to call.  Pt is calling for results of her CT     Please call

## 2019-06-09 NOTE — Telephone Encounter (Signed)
Spoke with patient. Informed patient that results of CT are not back yet per Hilda Blades, RN. Once results are read by the doctor his nurse will call her with the information. Patient verbalized understanding.

## 2019-06-10 ENCOUNTER — Other Ambulatory Visit: Payer: Self-pay | Admitting: *Deleted

## 2019-06-10 DIAGNOSIS — R0600 Dyspnea, unspecified: Secondary | ICD-10-CM

## 2019-06-10 DIAGNOSIS — R0609 Other forms of dyspnea: Secondary | ICD-10-CM

## 2019-06-10 MED ORDER — METOPROLOL TARTRATE 50 MG PO TABS: ORAL_TABLET | ORAL | 0 refills | Status: DC

## 2019-06-15 ENCOUNTER — Telehealth (HOSPITAL_COMMUNITY): Payer: Self-pay | Admitting: Emergency Medicine

## 2019-06-15 NOTE — Progress Notes (Signed)
Pt called regarding her appt tomorrow at 0730 for CT Heart. Pt was very upset that she had been "on hold for more than 16 minutes trying to get in touch with you people." I apologized to this pt for her wait and multiple transfers before she was able to talk to someone who knew about her scan tomorrow. Advised pt that I will get in touch with CT Heart Nav, Clarise Cruz, and have Clarise Cruz call her back ASAP. I also provided the phone number to this RN station should she not get a return call by 4pm. Pt verbalized understanding and took down this phone number [phone]. I contacted CT Heart Nav, Clarise Cruz and she states that she will call pt.

## 2019-06-15 NOTE — Telephone Encounter (Signed)
Reaching out to patient to offer assistance regarding upcoming cardiac imaging study; pt verbalizes understanding of appt date/time, parking situation and where to check in, pre-test NPO status and medications ordered, and verified current allergies; name and call back number provided for further questions should they arise Marchia Bond RN [facility] [phone] office [phone] cell  Pt did voice her displeasure regarding how her situation has been treated regarding having to come in for a repeat CTA. Appropriate management/leadship roles are aware of this as well. Pt wishes to be involved in the follow-up discussions

## 2019-06-16 ENCOUNTER — Telehealth: Payer: Self-pay | Admitting: *Deleted

## 2019-06-16 ENCOUNTER — Other Ambulatory Visit: Payer: Self-pay

## 2019-06-16 ENCOUNTER — Ambulatory Visit (HOSPITAL_COMMUNITY)
Admission: RE | Admit: 2019-06-16 | Discharge: 2019-06-16 | Disposition: A | Discharge status: Home / Self Care | Payer: Medicare PPO | Source: Ambulatory Visit | Attending: Cardiology | Admitting: Cardiology

## 2019-06-16 DIAGNOSIS — R0600 Dyspnea, unspecified: Secondary | ICD-10-CM

## 2019-06-16 DIAGNOSIS — I2510 Atherosclerotic heart disease of native coronary artery without angina pectoris: Secondary | ICD-10-CM | POA: Insufficient documentation

## 2019-06-16 DIAGNOSIS — R0609 Other forms of dyspnea: Secondary | ICD-10-CM | POA: Diagnosis not present

## 2019-06-16 DIAGNOSIS — I700 Atherosclerosis of aorta: Secondary | ICD-10-CM

## 2019-06-16 MED ORDER — NITROGLYCERIN 0.4 MG SL SUBL
SUBLINGUAL_TABLET | SUBLINGUAL | Status: AC
  Filled 2019-06-16: qty 2

## 2019-06-16 MED ORDER — NITROGLYCERIN 0.4 MG SL SUBL: 0.8000 mg | SUBLINGUAL_TABLET | Freq: Once | SUBLINGUAL | Status: DC

## 2019-06-16 MED ORDER — IOHEXOL 350 MG/ML SOLN
100.0000 mL | Freq: Once | INTRAVENOUS | Status: AC | PRN
  Administered 2019-06-16: 100 mL via INTRAVENOUS

## 2019-06-16 NOTE — Progress Notes (Signed)
Pt tolerated procedure well. She is back in the nurses station. Her vitals are stable. Assured pt that Dr [name] is the reader of her exam today and that he will read it today as his schedule allows. She appreciates this.

## 2019-06-16 NOTE — Progress Notes (Signed)
Pt agreeable to move forward with CT scan. Still adamant that someone can confirm this exam is read and "good" before she is willing to leave.

## 2019-06-16 NOTE — Telephone Encounter (Addendum)
Left message for pt to call   ----- Message from Lelon Perla, MD sent at 06/16/2019  1:09 PM EST ----- Minimal coronary artery disease but not obstructive.  Medical therapy.  Would add Crestor 20 mg daily with lipids and liver in 12 weeks to prevent progression if patient agreeable. Kirk Ruths

## 2019-06-16 NOTE — Progress Notes (Signed)
Pt arrived to radiology for CT Heart. She reiterates our conversation yesterday regarding her frustrations with the telephone and having to reschedule this test. PT states that before beginning the exam she would like to ensure that the exam is able to be read so that she does not have to go through this again. I have contacted CT Heart Nav, Clarise Cruz regarding this. Awaiting return call. HR 67 frequent PACs BP 125/81

## 2019-06-17 NOTE — Telephone Encounter (Signed)
Spoke with pt, she is not interested in starting crestor at this time.

## 2019-06-22 ENCOUNTER — Ambulatory Visit: Payer: Medicare PPO | Admitting: Physician Assistant

## 2019-06-22 ENCOUNTER — Other Ambulatory Visit: Payer: Self-pay

## 2019-06-22 ENCOUNTER — Encounter: Payer: Self-pay | Admitting: Physician Assistant

## 2019-06-22 DIAGNOSIS — M25562 Pain in left knee: Secondary | ICD-10-CM | POA: Diagnosis not present

## 2019-06-22 DIAGNOSIS — M461 Sacroiliitis, not elsewhere classified: Secondary | ICD-10-CM | POA: Diagnosis not present

## 2019-06-22 NOTE — Progress Notes (Signed)
Office Visit Note   Patient: Lindsay Clark           Date of Birth: 1940-11-10           MRN: [id number] Visit Date: 06/22/2019              Requested by: [name], MD [place],  [place] [address] PCP: [name], MD   Assessment & Plan: Visit Diagnoses:  1. Sacroiliitis, not elsewhere classified ([place])   2. Left knee pain, unspecified chronicity     Plan: We will send her for an injection right SI joint with Dr. Ernestina Patches in April per patient's request.  She will follow-up with [place] as needed.  Questions encouraged and answered  Follow-Up Instructions: No follow-ups on file.   Orders:  No orders of the defined types were placed in this encounter.  No orders of the defined types were placed in this encounter.     Procedures: No procedures performed   Clinical Data: No additional findings.   Subjective: Chief Complaint  Patient presents with  . Left Knee - Follow-up    HPI Lindsay Clark returns today follow-up status post left knee aspiration injection with cortisone 2 weeks status post procedure.  She states overall the knee is doing very well.  She has no pain in the knee.  She is wanting to discuss her SI joint pain which she seen Dr. Ninfa Linden for in the past.  She is having pain right SI joint.  No radicular symptoms down the leg.  No hip pain. Review of Systems Negative for fevers chills shortness of breath chest pain  Objective: Vital Signs: There were no vitals taken for this visit.  Physical Exam Constitutional:      Appearance: She is not ill-appearing or diaphoretic.  Neurological:     Mental Status: She is alert and oriented to person, place, and time.  Psychiatric:        Mood and Affect: Mood normal.     Ortho Exam Left knee good range of motion without pain.  No abnormal warmth erythema or effusion. Right SI joint tenderness with palpation over the joint.  Specialty Comments:  No specialty comments  available.  Imaging: No results found.   PMFS History: Patient Active Problem List   Diagnosis Date Noted  . Prolapsed internal hemorrhoids, grade 2 03/03/2019  . Brain lesion 06/24/2017  . Osteoarthritis   . Multiple lesions on CT of brain and spine   . Movement disorder   . Lumbar spondylosis   . Hyperlipidemia   . DDD (degenerative disc disease), lumbar   . Colitis   . Dermatitis   . Cervical spondylosis   . Cervical disc disease   . Diastolic dysfunction 0000000  . Syncope 04/18/2016  . Anxiety 04/18/2016  . Hypertension   . Autoimmune disorder-autoimmune encephalitis   . Dystonia 03/16/2015  . Coronary artery calcification of native artery 11/04/2014  . Abnormal finding on MRI of brain 10/04/2014  . Pruritus 11/30/2011  . 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 Atherosclerosis    mild cerebral  . Atrial fibrillation and flutter ([place])   . Autoimmune disorder ([place])   . Cervical disc disease    with spondylosis  . Cervical spondylosis   . Chronic dermatitis    eczematous  . Colitis   . DDD (degenerative disc disease), lumbar   . Frozen shoulder    left  . Hyperlipidemia   .  Hypertension   . Lateral epicondylitis   . Lumbar spondylosis   . Movement disorder   . Multiple lesions on CT of brain and spine    reports lesions from [facility] CT [facility]   . 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    Family History  Problem Relation Age of Onset  . Stroke Mother   . Colon cancer Mother 70  . Heart attack Father 63  . Emphysema Father   . Thyroid disease Son   . Thyroid disease Maternal Grandmother   . Cancer Maternal Grandmother        cervical   . Thrombosis Maternal Grandfather     Past Surgical History:  Procedure Laterality Date  . CATARACT EXTRACTION    . COLONOSCOPY    . EYE SURGERY Left   . HEMORRHOID BANDING    . LOOP RECORDER INSERTION N/A 07/05/2017   Procedure: LOOP RECORDER INSERTION;  Surgeon:  Thompson Grayer, MD;  Location: [facility];  Service: Cardiovascular;  Laterality: N/A;  . TUBAL LIGATION  1980   Social History   Occupational History  . Occupation: Retired    Fish farm manager: [employer]  Tobacco Use  . Smoking status: Never Smoker  . Smokeless tobacco: Never Used  Substance and Sexual Activity  . Alcohol use: Yes    Alcohol/week: 0.0 standard drinks    Comment: 4 glasses of wine weekly  . Drug use: No  . Sexual activity: Not Currently

## 2019-07-01 ENCOUNTER — Encounter (HOSPITAL_COMMUNITY): Payer: Self-pay | Admitting: *Deleted

## 2019-07-01 ENCOUNTER — Encounter: Payer: Self-pay | Admitting: Orthopaedic Surgery

## 2019-07-01 NOTE — Progress Notes (Signed)
Pt called this radiology nurses station regarding a bill that she received from her CT Heart exam on 06/16/2019. Pt states that she had a ct scan that was incomplete due to reasons out of her control and the scan was rescheduled. She states that she was told she would not be charged for this exam and she has received a bill. I placed the pt on hold to find a number for billing and in the meantime the pt ended the call. I returned call to pt to provide her with a number for billing and she states that she has taken it to the manager of the practice [facility]. She states that she left [place] a message and she expects that she will return her call as she has spoken to her before regarding issues with this test. She reiterates that this is all very frustrating to patients who need testing and "the right hand never knows what the left one is doing." I apologized to pt for her frustrations. She appreciated my return call.

## 2019-07-10 ENCOUNTER — Telehealth: Payer: Self-pay | Admitting: Physical Medicine and Rehabilitation

## 2019-07-10 NOTE — Telephone Encounter (Signed)
discard

## 2019-07-14 ENCOUNTER — Ambulatory Visit: Payer: Medicare PPO | Admitting: Physical Medicine and Rehabilitation

## 2019-08-12 ENCOUNTER — Other Ambulatory Visit: Payer: Self-pay | Admitting: Radiology

## 2019-08-12 DIAGNOSIS — M25562 Pain in left knee: Secondary | ICD-10-CM

## 2019-08-13 ENCOUNTER — Ambulatory Visit: Payer: Medicare PPO | Admitting: Physical Medicine and Rehabilitation

## 2019-08-13 NOTE — Telephone Encounter (Signed)
I sw pt and advised her that I would send her elsewhere besides [facility], pt states she wants to go to [facility] in [place] if insurnace approves there. Orders was sent to Cyndia Diver and Jenita Seashore with [facility], they will contact pt to schedule appt.

## 2019-08-17 ENCOUNTER — Encounter: Payer: Self-pay | Admitting: Physician Assistant

## 2019-08-17 ENCOUNTER — Ambulatory Visit: Payer: Medicare PPO | Admitting: Physician Assistant

## 2019-08-17 ENCOUNTER — Telehealth: Payer: Self-pay | Admitting: Radiology

## 2019-08-17 ENCOUNTER — Other Ambulatory Visit: Payer: Self-pay

## 2019-08-17 DIAGNOSIS — M1712 Unilateral primary osteoarthritis, left knee: Secondary | ICD-10-CM

## 2019-08-17 NOTE — Progress Notes (Addendum)
HPI: Lindsay Clark returns today to go over the MRI of her left knee..  She continues to have pain in her left knee.  States initially the cortisone injection back in February she did well. MRI results left knee are reviewed with the patient.  Tricompartmental changes of the knee with grade III chondromalacia of the patella.  An area of grade 4 changes posterior lateral femoral condyle and tibial plateau.  Grade II chondromalacia along the weightbearing surface in the medial compartment.  Medial meniscus intersubstance degenerative changes and irregularity of the body posterior horn.  Lateral meniscus intrasubstance degeneration or tear along the anterior horn/root.   Moderate effusion.  Physical exam: Left knee full extension flexion to beyond 90 degrees.  Slight effusion no abnormal warmth erythema.  Impression: Tricompartmental arthritis left knee  Medial and lateral meniscus degeneration  Plan: Patient has a trip planned for middle of May.  She would like to undergo another aspiration and cortisone injection in the knee prior to the trip.  She would like to avoid knee surgery particularly total knee replacement on try all conservative measures.  I did discuss with her given her amount of arthritis I did not feel that an arthroscopy would help.  She is having no  mechanical symptoms in the knee.  Therefore we will see her back in May and provide her with aspiration and injection with cortisone left knee.  Also order supplemental injection and have her return for this when she has returned from her vacation.

## 2019-08-17 NOTE — Telephone Encounter (Signed)
Request left knee supplemental injection

## 2019-08-18 NOTE — Telephone Encounter (Signed)
Submitted for VOB for Monovisc-Left knee 

## 2019-08-19 ENCOUNTER — Telehealth: Payer: Self-pay

## 2019-08-19 NOTE — Telephone Encounter (Signed)
Pt informed and stated understanding to approval and copay.

## 2019-08-19 NOTE — Telephone Encounter (Signed)
Tried calling, was ask to call back in an hour

## 2019-08-19 NOTE — Telephone Encounter (Signed)
Approved for Monovisc-Left knee Dr. Margarito Liner and Bill $40 copay No OOP Prior auth required with [employer] auth # [id number] Dates 08/19/19-02/18/20   [place], ok to schedule @ next available

## 2019-08-27 ENCOUNTER — Other Ambulatory Visit: Payer: Self-pay | Admitting: Cardiology

## 2019-09-02 ENCOUNTER — Other Ambulatory Visit: Payer: Self-pay

## 2019-09-02 ENCOUNTER — Encounter: Payer: Self-pay | Admitting: Physician Assistant

## 2019-09-02 ENCOUNTER — Ambulatory Visit: Payer: Medicare PPO | Admitting: Physician Assistant

## 2019-09-02 DIAGNOSIS — M1712 Unilateral primary osteoarthritis, left knee: Secondary | ICD-10-CM | POA: Diagnosis not present

## 2019-09-02 MED ORDER — METHYLPREDNISOLONE ACETATE 40 MG/ML IJ SUSP
40.0000 mg | INTRAMUSCULAR | Status: AC | PRN
  Administered 2019-09-02: 40 mg via INTRA_ARTICULAR

## 2019-09-02 MED ORDER — LIDOCAINE HCL 1 % IJ SOLN
3.0000 mL | INTRAMUSCULAR | Status: AC | PRN
  Administered 2019-09-02: 17:00:00 3 mL

## 2019-09-02 NOTE — Progress Notes (Addendum)
   Procedure Note  Patient: Lindsay Clark             Date of Birth: 1940/05/06           MRN: [id number]             Visit Date: 09/02/2019 HPI: Ms. Umeda returns today for aspiration injection of her left knee.  Again she has tricompartmental arthritis left knee.  She has had no new injuries to the knee.  Physical exam: Left knee positive effusion no abnormal warmth erythema.  Full extension flexion of the knee.   Procedures: Visit Diagnoses:  1. Primary osteoarthritis of left knee     Large Joint Inj: L knee on 09/02/2019 4:39 PM Indications: pain Details: 22 G 1.5 in needle, anterolateral approach  Arthrogram: No  Medications: 3 mL lidocaine 1 %; 40 mg methylPREDNISolone acetate 40 MG/ML Aspirate: 25 mL yellow and blood-tinged Outcome: tolerated well, no immediate complications Procedure, treatment alternatives, risks and benefits explained, specific risks discussed. Consent was given by the patient. Immediately prior to procedure a time out was called to verify the correct patient, procedure, equipment, support staff and site/side marked as required. Patient was prepped and draped in the usual sterile fashion.     Plan: She will follow up on as-needed basis.  She will follow up for a supplemental injection in the future.  Questions encouraged and answered.  Patient tolerated aspiration injection.

## 2019-09-04 ENCOUNTER — Telehealth: Payer: Self-pay | Admitting: Physician Assistant

## 2019-09-04 NOTE — Telephone Encounter (Signed)
Pt called stating she noticed her heart rate was elevated on her Fitbit to 112 and 114. She then took her BP and noticed it was elevated to the 160s with HR in the 60s. I had her check it on the phone with me and her BP was [id number] systolic. No headache, blurry vision, or one-sided weakness. She is not dizzy or pre-syncopal. She is traveling out of town on Sunday and would like to go to [facility] to be evaluated. I think this is appropriate. She states that if her BP gets worse or if she has symptoms, she will be evaluated.  She does not normally check her BP, but reports it runs [id number] systolic at doctor's offices. We discussed ED precautions. She expressed understanding of the plan.

## 2019-09-23 ENCOUNTER — Encounter: Payer: Self-pay | Admitting: Cardiology

## 2019-09-23 DIAGNOSIS — I10 Essential (primary) hypertension: Secondary | ICD-10-CM | POA: Diagnosis not present

## 2019-09-23 DIAGNOSIS — E785 Hyperlipidemia, unspecified: Secondary | ICD-10-CM | POA: Diagnosis not present

## 2019-09-28 DIAGNOSIS — M359 Systemic involvement of connective tissue, unspecified: Secondary | ICD-10-CM | POA: Diagnosis not present

## 2019-09-28 DIAGNOSIS — G259 Extrapyramidal and movement disorder, unspecified: Secondary | ICD-10-CM | POA: Diagnosis not present

## 2019-09-28 DIAGNOSIS — Z0000 Encounter for general adult medical examination without abnormal findings: Secondary | ICD-10-CM | POA: Diagnosis not present

## 2019-09-28 DIAGNOSIS — D6869 Other thrombophilia: Secondary | ICD-10-CM | POA: Diagnosis not present

## 2019-09-28 DIAGNOSIS — I480 Paroxysmal atrial fibrillation: Secondary | ICD-10-CM | POA: Diagnosis not present

## 2019-09-28 DIAGNOSIS — I10 Essential (primary) hypertension: Secondary | ICD-10-CM | POA: Diagnosis not present

## 2019-10-06 ENCOUNTER — Telehealth: Payer: Self-pay | Admitting: Radiology

## 2019-10-06 ENCOUNTER — Ambulatory Visit: Payer: Medicare PPO | Admitting: Physician Assistant

## 2019-10-06 ENCOUNTER — Encounter: Payer: Self-pay | Admitting: Physician Assistant

## 2019-10-06 ENCOUNTER — Ambulatory Visit: Payer: Self-pay

## 2019-10-06 ENCOUNTER — Other Ambulatory Visit: Payer: Self-pay

## 2019-10-06 ENCOUNTER — Other Ambulatory Visit: Payer: Self-pay | Admitting: Radiology

## 2019-10-06 DIAGNOSIS — M25561 Pain in right knee: Secondary | ICD-10-CM | POA: Diagnosis not present

## 2019-10-06 DIAGNOSIS — M1712 Unilateral primary osteoarthritis, left knee: Secondary | ICD-10-CM

## 2019-10-06 NOTE — Telephone Encounter (Signed)
Bilateral knee supplemental injections Tricompartmental arthritis.

## 2019-10-06 NOTE — Progress Notes (Addendum)
HPI: Lindsay Clark comes in today due to bilateral knee pain.  Again she has tricompartmental arthritis of the left knee she did obtain an MRI earlier this spring on the left knee which show tricompartmental changes.  She reports that while on vacation that the right knee she had a fall in the airport and injured coming down directly on the knee.  The fall was due to her tripping over her shoe.  She is again having pain in the left knee shooting pain posterior aspect of the knee.  Denies any numbness tingling down either leg.  Today the left knee is bothering her more so than the right.  She has taken Aleve which she is not supposed to and that helps with the left knee pain.  Left knee injection 09/02/2019 gave her short-term relief.  She feels like both knees are weak she has a hard time getting up from a seated position especially chair that does not have good arm rest.  Review of systems see HPI otherwise negative or noncontributory.  Physical exam: Bilateral knees full flexion extension.  No instability valgus varus stressing of either knee.  Positive effusion left knee right knee no effusion.  No abnormal warmth erythema or ecchymosis of either knee.  Radiographs AP lateral views right knee show no acute fractures.  Calcinosis of the meniscus noted.  Tricompartmental arthritic changes with mild  medial compartmental narrowing.  Moderate patellofemoral compartmental changes.  Periarticular spurring off the lateral compartment.  Impression: Bilateral knee osteoarthritis  Plan: We will send her to formal physical therapy for bilateral quad strengthening.  Recommend try supplemental injections both knees.  She most likely will need an aspiration of the left knee prior to the supplemental injection.  Questions were encouraged and answered at length.

## 2019-10-07 NOTE — Telephone Encounter (Signed)
Noted  

## 2019-10-09 ENCOUNTER — Telehealth: Payer: Self-pay

## 2019-10-09 NOTE — Telephone Encounter (Signed)
Submitted for Monovisc, right knee due to patient being approved for monovisc, left knee.

## 2019-10-13 ENCOUNTER — Telehealth: Payer: Self-pay

## 2019-10-13 NOTE — Telephone Encounter (Signed)
Approved, Monovisc, right knee. Buy & Bill Once OOP has been met, patient will be covered at 100%. Co-pay of $40.00 PA required PA Approval# [id number] Valid 10/13/2019- 04/13/2020  Previously approved for Monovisc, left knee. See message for 08/19/2019 per Autumn H. 

## 2019-10-19 ENCOUNTER — Other Ambulatory Visit: Payer: Self-pay

## 2019-10-19 ENCOUNTER — Encounter: Payer: Self-pay | Admitting: Orthopaedic Surgery

## 2019-10-19 ENCOUNTER — Ambulatory Visit: Payer: Medicare PPO | Admitting: Orthopaedic Surgery

## 2019-10-19 DIAGNOSIS — M1711 Unilateral primary osteoarthritis, right knee: Secondary | ICD-10-CM | POA: Diagnosis not present

## 2019-10-19 DIAGNOSIS — M170 Bilateral primary osteoarthritis of knee: Secondary | ICD-10-CM

## 2019-10-19 DIAGNOSIS — M1712 Unilateral primary osteoarthritis, left knee: Secondary | ICD-10-CM | POA: Diagnosis not present

## 2019-10-19 MED ORDER — HYALURONAN 88 MG/4ML IX SOSY
88.0000 mg | PREFILLED_SYRINGE | INTRA_ARTICULAR | Status: AC | PRN
  Administered 2019-10-19: 88 mg via INTRA_ARTICULAR

## 2019-10-19 MED ORDER — LIDOCAINE HCL 1 % IJ SOLN
3.0000 mL | INTRAMUSCULAR | Status: AC | PRN
  Administered 2019-10-19: 3 mL

## 2019-10-19 NOTE — Progress Notes (Signed)
HPI: FUPAF. 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 Patient seen in September 2017with complaints of palpitations and near syncope. Patient was scheduled for an exercise treadmill and when she came she was noted to have runs of paroxysmal atrial tachycardia/fibrillation. Procedure was canceled.Monitor October 2018 showed paroxysmal atririllation and flutter. Nuclear study October 2018 showed ejection fraction 67%, apical thinning but no ischemia.Placed on apixaban.Had implantable loop recorder placed by Dr. Rayann Heman for syncope and recurrent palpitations. She has been found to have "heart pounding" episodes not related to arrhythmia. Echo repeated 2/21 showed normal LV function, grade 1 DD, mild to moderate LAE. Coronary CTA 2/21 showed Ca score 36 and minimal (0-24%) in proximal LM, proximal and mid LAD and proximal Lcx. Since last seen,she has not had chest pain or syncope.  She has dyspnea on exertion unchanged.  Current Outpatient Medications  Medication Sig Dispense Refill  . diltiazem (CARDIZEM CD) 240 MG 24 hr capsule TAKE 1 CAPSULE EVERY DAY. 30 capsule 6  . ELIQUIS 5 MG TABS tablet TAKE 1 TABLET BY MOUTH TWICE DAILY. 60 tablet 9  . furosemide (LASIX) 40 MG tablet TAKE 1 TABLET EACH DAY. 90 tablet 0  . hydroxychloroquine (PLAQUENIL) 200 MG tablet Take 200 mg by mouth 2 (two) times daily.    . hydrOXYzine (ATARAX/VISTARIL) 10 MG tablet Take 10 mg by mouth 3 (three) times daily as needed.    . metoprolol tartrate (LOPRESSOR) 50 MG tablet TAKE 2 HOURS PRIOR TO CT SCAN 1 tablet 0  . Multiple Vitamins-Minerals (ONE-A-DAY WOMENS 50 PLUS PO) Take 1 tablet by mouth daily.     . Potassium Chloride ER 20 MEQ TBCR Take 2 tablets twice daily x 2 days then decrease to 2 tablets daily (Patient taking differently: Take 20 mEq by mouth daily. Take 2 tablets twice daily x 2 days then decrease to 2 tablets daily) 68 tablet 6  . Propylene Glycol 0.6 % SOLN Place 1 drop into  both eyes at bedtime.     No current facility-administered medications for this visit.     Past Medical History:  Diagnosis Date  . Atherosclerosis    mild cerebral  . Atrial fibrillation and flutter ([place])   . Autoimmune disorder ([place])   . Cervical disc disease    with spondylosis  . Cervical spondylosis   . Chronic dermatitis    eczematous  . Colitis   . DDD (degenerative disc disease), lumbar   . Frozen shoulder    left  . Hyperlipidemia   . Hypertension   . Lateral epicondylitis   . Lumbar spondylosis   . Movement disorder   . Multiple lesions on CT of brain and spine    reports lesions from [facility] CT scan [facility]   . 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 CATARACT EXTRACTION    . COLONOSCOPY    . EYE SURGERY Left   . HEMORRHOID BANDING    . LOOP RECORDER INSERTION N/A 07/05/2017   Procedure: LOOP RECORDER INSERTION;  Surgeon: Thompson Grayer, MD;  Location: [facility];  Service: Cardiovascular;  Laterality: N/A;  . TUBAL LIGATION  1980    Social History   Socioeconomic History  . Marital status: Married    Spouse name: [name]  . Number of children: 2  . Years of education: MAx2  . Highest education level: Not on file  Occupational History  . Occupation: Retired  Employer: OTHER  Tobacco Use  . Smoking status: Never Smoker  . Smokeless tobacco: Never Used  Vaping Use  . Vaping Use: Never used  Substance and Sexual Activity  . Alcohol use: Yes    Alcohol/week: 0.0 standard drinks    Comment: 4 glasses of wine weekly  . Drug use: No  . Sexual activity: Not Currently  Other Topics Concern  . Not on file  Social History Narrative   Patient lives at home with her spouse.   Caffeine Use: 2-3 cups daily   Social Determinants of Health   Financial Resource Strain:   . Difficulty of Paying Living Expenses:   Food Insecurity:   . Worried About Charity fundraiser in the Last Year:   . Youth worker in the Last Year:   Transportation Needs:   . Film/video editor (Medical):   Marland Kitchen Lack of Transportation (Non-Medical):   Physical Activity:   . Days of Exercise per Week:   . Minutes of Exercise per Session:   Stress:   . Feeling of Stress :   Social Connections:   . Frequency of Communication with Friends and Family:   . Frequency of Social Gatherings with Friends and Family:   . Attends Religious Services:   . Active Member of Clubs or Organizations:   . Attends Archivist Meetings:   Marland Kitchen Marital Status:   Intimate Partner Violence:   . Fear of Current or Ex-Partner:   . Emotionally Abused:   Marland Kitchen Physically Abused:   . Sexually Abused:     Family History  Problem Relation Age of Onset  . Stroke Mother   . Colon cancer Mother 10  . Heart attack Father 59  . Emphysema Father   . Thyroid disease Son   . Thyroid disease Maternal Grandmother   . Cancer Maternal Grandmother        cervical   . Thrombosis Maternal Grandfather     ROS: no fevers or chills, productive cough, hemoptysis, dysphasia, odynophagia, melena, hematochezia, dysuria, hematuria, rash, seizure activity, orthopnea, PND, pedal edema, claudication. Remaining systems are negative.  Physical Exam: Well-developed well-nourished in no acute distress.  Skin is warm and dry.  HEENT is normal.  Neck is supple.  Chest is clear to auscultation with normal expansion.  Cardiovascular exam is regular rate and rhythm.  Abdominal exam nontender or distended. No masses palpated. Extremities show no edema. neuro grossly intact  A/P  1 paroxysmal atrial fibrillation-patient is in sinus rhythm on exam.  Continue Cardizem and apixaban.We will consider addition of antiarrhythmic in the future if needed.  2 hypertension-patient's blood pressure is controlled.  Continue present medical regimen.  3 prior syncopal episode-implantable loop recorder in place with no further episodes to date.  4 history of  dyspnea on exertion-echo showed normal LV function and CTA showed no obstructive coronary disease.  5 history of pedal edema-continue diuretic at present dose.  6 coronary atherosclerosis-we will not add aspirin given need for apixaban.  She does not want lipid-lowering medications.  Kirk Ruths, MD

## 2019-10-19 NOTE — Progress Notes (Signed)
   Procedure Note  Patient: Lindsay Clark             Date of Birth: June 12, 1940           MRN: [id number]             Visit Date: 10/19/2019  Procedures: Visit Diagnoses:  1. Unilateral primary osteoarthritis, left knee   2. Unilateral primary osteoarthritis, right knee     Large Joint Inj: R knee on 10/19/2019 3:15 PM Indications: diagnostic evaluation and pain Details: 22 G 1.5 in needle, superolateral approach  Arthrogram: No  Medications: 3 mL lidocaine 1 %; 88 mg Hyaluronan 88 MG/4ML Outcome: tolerated well, no immediate complications Procedure, treatment alternatives, risks and benefits explained, specific risks discussed. Consent was given by the patient. Immediately prior to procedure a time out was called to verify the correct patient, procedure, equipment, support staff and site/side marked as required. Patient was prepped and draped in the usual sterile fashion.   Large Joint Inj: L knee on 10/19/2019 3:15 PM Indications: diagnostic evaluation and pain Details: 22 G 1.5 in needle, superolateral approach  Arthrogram: No  Medications: 3 mL lidocaine 1 %; 88 mg Hyaluronan 88 MG/4ML Outcome: tolerated well, no immediate complications Procedure, treatment alternatives, risks and benefits explained, specific risks discussed. Consent was given by the patient. Immediately prior to procedure a time out was called to verify the correct patient, procedure, equipment, support staff and site/side marked as required. Patient was prepped and draped in the usual sterile fashion.    The patient comes in today with significant frustration as it relates to her ability to get up from a seated position which is becoming more frequent a difficulty for her.  This is very painful her to do this.  She does have known osteoarthritis of both knees which is at least moderate grade 3 and the main weightbearing compartments of the knee.  This is seen on MRI of her left knee.  Her plain films on both  knees do show at least mild to moderate arthritis.  She is very frustrated at her ability to get up from a standing position and this did even occur yesterday when eating at a restaurant.  When I see her stand she does seem to have is some varus alignment of both knees and does have difficulty getting up and standing and I feel based on her exam this is related entirely to her knees.  When I isolate muscle groups on both lower extremities she has 5 out of 5 strength throughout normal sensation but think that this is certainly related to her knees.  I did aspirate 20 cc of fluid off of the left knee and then placed Monovisc with hyaluronic acid in the left knee.  I did not aspirate any fluid off the right knee but place Monovisc in this knee as well.  Our goal is to hopefully treat the arthritis in both knees with this.  She does have physical therapy forthcoming to help with her balance and coordination hopefully core strengthening and strengthening mainly her quads and the muscles around her knees.  I like to see her back in about 6 weeks to see how she is doing overall.  All questions and concerns were answered and addressed.

## 2019-10-27 ENCOUNTER — Encounter: Payer: Self-pay | Admitting: Cardiology

## 2019-10-27 ENCOUNTER — Ambulatory Visit: Payer: Medicare PPO | Admitting: Cardiology

## 2019-10-27 ENCOUNTER — Other Ambulatory Visit: Payer: Self-pay

## 2019-10-27 VITALS — BP 126/82 | HR 94 | Ht 64.0 in | Wt 210.4 lb

## 2019-10-27 DIAGNOSIS — I480 Paroxysmal atrial fibrillation: Secondary | ICD-10-CM | POA: Diagnosis not present

## 2019-10-27 DIAGNOSIS — R002 Palpitations: Secondary | ICD-10-CM | POA: Diagnosis not present

## 2019-10-27 DIAGNOSIS — I10 Essential (primary) hypertension: Secondary | ICD-10-CM

## 2019-10-27 NOTE — Patient Instructions (Signed)

## 2019-10-28 ENCOUNTER — Ambulatory Visit: Payer: Medicare PPO | Attending: Physician Assistant | Admitting: Physical Therapy

## 2019-10-28 DIAGNOSIS — R278 Other lack of coordination: Secondary | ICD-10-CM | POA: Diagnosis not present

## 2019-10-28 DIAGNOSIS — G8929 Other chronic pain: Secondary | ICD-10-CM | POA: Diagnosis not present

## 2019-10-28 DIAGNOSIS — M25562 Pain in left knee: Secondary | ICD-10-CM | POA: Insufficient documentation

## 2019-10-28 DIAGNOSIS — M25662 Stiffness of left knee, not elsewhere classified: Secondary | ICD-10-CM

## 2019-10-28 DIAGNOSIS — M25561 Pain in right knee: Secondary | ICD-10-CM | POA: Diagnosis not present

## 2019-10-28 DIAGNOSIS — M25661 Stiffness of right knee, not elsewhere classified: Secondary | ICD-10-CM | POA: Diagnosis not present

## 2019-10-28 DIAGNOSIS — R262 Difficulty in walking, not elsewhere classified: Secondary | ICD-10-CM | POA: Insufficient documentation

## 2019-10-28 NOTE — Therapy (Signed)
[place], [place], [address] Phone: [phone]   Fax:  [phone]  Physical Therapy Evaluation  Patient Details  Name: Lindsay Clark MRN: [id number] Date of Birth: 04-07-1941 Referring Provider (PT): Dr. Ninfa Linden    Encounter Date: 10/28/2019   PT End of Session - 10/28/19 1131    Visit Number 1    Number of Visits 12    Date for PT Re-Evaluation 12/23/19    Authorization Type [employer]    PT Start Time 1045    PT Stop Time 1135    PT Time Calculation (min) 50 min    Activity Tolerance Patient tolerated treatment well    Behavior During Therapy [facility] for tasks assessed/performed           Past Medical History:  Diagnosis Date  . Atherosclerosis    mild cerebral  . Atrial fibrillation and flutter ([place])   . Autoimmune disorder ([place])   . Cervical disc disease    with spondylosis  . Cervical spondylosis   . Chronic dermatitis    eczematous  . Colitis   . DDD (degenerative disc disease), lumbar   . Frozen shoulder    left  . Hyperlipidemia   . Hypertension   . Lateral epicondylitis   . Lumbar spondylosis   . Movement disorder   . Multiple lesions on CT of brain and spine    reports lesions from previous CT scan at [facility]   . 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 CATARACT EXTRACTION    . COLONOSCOPY    . EYE SURGERY Left   . HEMORRHOID BANDING    . LOOP RECORDER INSERTION N/A 07/05/2017   Procedure: LOOP RECORDER INSERTION;  Surgeon: Thompson Grayer, MD;  Location: [facility];  Service: Cardiovascular;  Laterality: N/A;  . TUBAL LIGATION  1980    There were no vitals filed for this visit.    Subjective Assessment - 10/28/19 1055    Subjective Patient presents with complaint of imbalance and knee pain which is chronic and affecting her ability to stand, walk and transfer from sit to stand.  She cannot scoot out of a bench in a  restaurant.  She would like to be able to improve her ability to get up without needing help or using so much effort. Just got the gel in my knees, L one hurts more than R.  Has done therapy before.    Pertinent History dystonic movements Rt LE/ankle Rt hand weakness , neurologist following, knee OA, A-fib    Limitations Standing;Walking;House hold activities;Lifting    Diagnostic tests XR showed medial compartment degenerative  and PF as well    Patient Stated Goals Walk and stand better.    Currently in Pain? Yes    Pain Score 7     Pain Location Knee    Pain Orientation Left    Pain Descriptors / Indicators Tightness    Pain Type Chronic pain    Pain Onset More than a month ago    Pain Frequency Intermittent    Aggravating Factors  weightbearing as the day goes on    Pain Relieving Factors rest, unable to take Aleve    Effect of Pain on Daily Activities limits    Multiple Pain Sites Yes    Pain Score 0    Pain Location Knee    Pain Orientation Right  [facility] PT Assessment - 10/28/19 0001      Assessment   Medical Diagnosis knee OA     Referring Provider (PT) Dr. Ninfa Linden     Onset Date/Surgical Date --   chronic    Prior Therapy Yes       Precautions   Precautions Fall;Other (comment)    Precaution Comments A-fib no IFC      Balance Screen   Has the patient fallen in the past 6 months Yes    How many times? 1    Has the patient had a decrease in activity level because of a fear of falling?  Yes    Is the patient reluctant to leave their home because of a fear of falling?  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, reading, Sudoku       Cognition   Overall Cognitive Status Within Functional Limits for tasks assessed      Observation/Other Assessments   Focus on Therapeutic Outcomes (FOTO)  45%      Sensation   Light Touch Appears Intact      Coordination   Gross Motor Movements are Fluid and Coordinated No    Coordination and Movement Description Rt ankle inverts, lacks Rt LE control in hooklying       AROM   Left Knee Extension 7    Left Knee Flexion 121      Strength   Overall Strength Comments decreased control Rt LE     Right Hip Flexion 4/5    Left Hip Flexion 4+/5    Right Knee Flexion 5/5    Right Knee Extension 4+/5    Left Knee Flexion 5/5    Left Knee Extension 4+/5    Right Ankle Dorsiflexion 5/5    Left Ankle Dorsiflexion 5/5      Palpation   Patella mobility pain L patella, crepitus       Transfers   Five time sit to stand comments  16.2 sec no UE       Ambulation/Gait   Ambulation Distance (Feet) 150 Feet    Assistive device None    Gait Pattern Step-through pattern;Decreased dorsiflexion - right;Scissoring;Lateral hip instability;Lateral trunk lean to right;Narrow base of support;Poor foot clearance - right                      Objective measurements completed on examination: See above findings.               PT Education - 10/28/19 1133    Education Details POC, HEP, functional measures ,core    Person(s) Educated Patient    Methods Explanation;Demonstration;Handout    Comprehension Verbalized understanding;Returned demonstration            PT Short Term Goals - 10/28/19 1134      PT SHORT TERM GOAL #1   Title Pt will be I with initial HEP for knee/hip/core strength    Time 3    Period Weeks    Status New    Target Date 11/18/19      PT SHORT TERM GOAL #2   Title Pt will complete various balance and functional screen  and set goals based on results.    Time 3    Period Weeks    Status New    Target Date 11/18/19             PT Long Term  Goals - 10/28/19 1352      PT LONG TERM GOAL #1   Title Pt will be able to do >.5  mile walk without increased knee/hip pain due to improved strength and gait techniques    Time 6    Period Weeks    Status New    Target Date 12/09/19      PT LONG TERM GOAL #2   Title ind with advanced HEP    Time 6    Period Weeks    Status New    Target Date 12/09/19      PT LONG TERM GOAL #3   Title Pt will be able to scoot laterally in bench at restaurant in either direction    Time 6    Period Weeks    Status New    Target Date 12/09/19      PT LONG TERM GOAL #4   Title 5 x sit to stand < or = to 12 sec in order to place patient within age related norms.    Baseline 16.2    Time 6    Period Weeks    Status New    Target Date 12/09/19                  Plan - 10/28/19 1353    Clinical Impression Statement Patient presents for mod complexty eval due to evolving condition in L>R knee.  She complains mostly of difficutly with functional activities that worsen as the day progresses.  She goes to restaurants 2-3 times per week and has trouble with transferring, is frustrated with this.  She has decent knee strength but lacks hip, core and Rt sided coordination due to residual effects of a neuro event in 2015. She will do well with PT intervention to improve mobility and prevent falls, reduce risk.    Personal Factors and Comorbidities Age;Comorbidity 1;Time since onset of injury/illness/exacerbation    Comorbidities Neuro deficits    Examination-Activity Limitations Stairs;Stand;Transfers;Lift;Carry;Locomotion Level;Squat    Examination-Participation Restrictions Interpersonal Relationship;Community Activity;Shop;Meal Prep    Stability/Clinical Decision Making Evolving/Moderate complexity    Clinical Decision Making Moderate    Rehab Potential Good    PT Frequency 2x / week    PT Duration 6 weeks    PT Treatment/Interventions ADLs/Self Care Home Management;Cryotherapy;Moist  Heat;Iontophoresis 4mg /ml Dexamethasone;Gait training;Therapeutic activities;Functional mobility training;Stair training;Therapeutic exercise;Balance training;Neuromuscular re-education;Manual techniques;Patient/family education;Passive range of motion;Taping    PT Next Visit Plan check HEP, balance: TUG, Berg vs DGI. Pt prefers to NOT use equipment (ball, bands ok)    PT Home Exercise Plan SLR, bridge, quad set and hamstring stretch    Consulted and Agree with Plan of Care Patient           Patient will benefit from skilled therapeutic intervention in order to improve the following deficits and impairments:  Decreased activity tolerance, Decreased endurance, Decreased range of motion, Decreased strength, Hypomobility, Increased fascial restricitons, Pain, Impaired UE functional use, Difficulty walking, Decreased mobility, Decreased coordination, Decreased balance, Impaired tone  Visit Diagnosis: Chronic pain of both knees  Other lack of coordination  Difficulty in walking, not elsewhere classified  Stiffness of left knee, not elsewhere classified  Stiffness of right knee, not elsewhere classified  Problem List Patient Active Problem List   Diagnosis Date Noted  . Primary osteoarthritis of left knee 09/02/2019  . Prolapsed internal hemorrhoids, grade 2 03/03/2019  . Brain lesion 06/24/2017  . Osteoarthritis   . Multiple lesions on CT of brain and spine   . Movement disorder   . Lumbar spondylosis   . Hyperlipidemia   . DDD (degenerative disc disease), lumbar   . Colitis   . Dermatitis   . Cervical spondylosis   . Cervical disc disease   . Diastolic dysfunction 96/22/2979  . Syncope 04/18/2016  . Anxiety 04/18/2016  . Hypertension   . Autoimmune disorder-autoimmune encephalitis   . Dystonia 03/16/2015  . Coronary artery calcification of native artery 11/04/2014  . Abnormal finding on MRI of brain 10/04/2014  . Pruritus 11/30/2011  . Lichen simplex chronicus  11/30/2011    Jeanetta Alonzo 10/28/2019, 2:01 PM  [facility] [address] [place], [place], [address] Phone: [phone]   Fax:  [phone]  Name: Lindsay Clark MRN: [id number] Date of Birth: Jun 24, 1940   Raeford Razor, PT 10/28/19 2:01 PM Phone: [phone] Fax: [phone]

## 2019-10-28 NOTE — Patient Instructions (Signed)
Access Code: [email or web address]: 07/07/2021Prepared by: Anderson Malta PaaExercises  Supine Quad Set - 2 x daily - 7 x weekly - 2 sets - 10 reps - 5 hold  Active Straight Leg Raise with Quad Set - 2 x daily - 7 x weekly - 2 sets - 10 reps - 5 hold  Supine Hamstring Stretch with Strap - 2 x daily - 7 x weekly - 1 sets - 3 reps - 30 hold  Supine Bridge - 2 x daily - 7 x weekly - 2 sets - 10 reps - 5 hold

## 2019-11-02 DIAGNOSIS — L821 Other seborrheic keratosis: Secondary | ICD-10-CM | POA: Diagnosis not present

## 2019-11-02 DIAGNOSIS — R61 Generalized hyperhidrosis: Secondary | ICD-10-CM | POA: Diagnosis not present

## 2019-11-03 ENCOUNTER — Encounter: Payer: Self-pay | Admitting: Physical Therapy

## 2019-11-03 ENCOUNTER — Ambulatory Visit: Payer: Medicare PPO | Admitting: Physical Therapy

## 2019-11-03 ENCOUNTER — Other Ambulatory Visit: Payer: Self-pay

## 2019-11-03 DIAGNOSIS — R262 Difficulty in walking, not elsewhere classified: Secondary | ICD-10-CM | POA: Diagnosis not present

## 2019-11-03 DIAGNOSIS — M25562 Pain in left knee: Secondary | ICD-10-CM

## 2019-11-03 DIAGNOSIS — G8929 Other chronic pain: Secondary | ICD-10-CM

## 2019-11-03 DIAGNOSIS — M25661 Stiffness of right knee, not elsewhere classified: Secondary | ICD-10-CM

## 2019-11-03 DIAGNOSIS — R278 Other lack of coordination: Secondary | ICD-10-CM

## 2019-11-03 DIAGNOSIS — M25561 Pain in right knee: Secondary | ICD-10-CM | POA: Diagnosis not present

## 2019-11-03 DIAGNOSIS — M25662 Stiffness of left knee, not elsewhere classified: Secondary | ICD-10-CM | POA: Diagnosis not present

## 2019-11-03 NOTE — Therapy (Signed)
[place], [place], [address] Phone: [phone]   Fax:  [phone]  Physical Therapy Treatment  Patient Details  Name: Lindsay Clark MRN: [id number] Date of Birth: 09/20/1940 Referring Provider (PT): Dr. Ninfa Linden    Encounter Date: 11/03/2019   PT End of Session - 11/03/19 1053    Visit Number 2    Number of Visits 12    Date for PT Re-Evaluation 12/23/19    Authorization Type [employer]    PT Start Time 1051    PT Stop Time 1135    PT Time Calculation (min) 44 min    Activity Tolerance Patient tolerated treatment well    Behavior During Therapy [facility] for tasks assessed/performed           Past Medical History:  Diagnosis Date  . Atherosclerosis    mild cerebral  . Atrial fibrillation and flutter ([place])   . Autoimmune disorder ([place])   . Cervical disc disease    with spondylosis  . Cervical spondylosis   . Chronic dermatitis    eczematous  . Colitis   . DDD (degenerative disc disease), lumbar   . Frozen shoulder    left  . Hyperlipidemia   . Hypertension   . Lateral epicondylitis   . Lumbar spondylosis   . Movement disorder   . Multiple lesions on CT of brain and spine    reports lesions from previous CT scan at [facility]   . 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 CATARACT EXTRACTION    . COLONOSCOPY    . EYE SURGERY Left   . HEMORRHOID BANDING    . LOOP RECORDER INSERTION N/A 07/05/2017   Procedure: LOOP RECORDER INSERTION;  Surgeon: Thompson Grayer, MD;  Location: [facility];  Service: Cardiovascular;  Laterality: N/A;  . TUBAL LIGATION  1980    There were no vitals filed for this visit.   Subjective Assessment - 11/03/19 1052    Subjective I had so much pain post L knee/thigh 2 days following the eval.  I'm back to normal now.  No pain currently.    Currently in Pain? No/denies                [facility] Adult PT  Treatment/Exercise - 11/03/19 0001      Knee/Hip Exercises: Stretches   Active Hamstring Stretch Left;2 reps;30 seconds    Passive Hamstring Stretch Left;2 reps;20 seconds    Passive Hamstring Stretch Limitations not well tolerated in sitting, tried multiple ways     Press photographer Left;3 reps;30 seconds      Knee/Hip Exercises: Aerobic   Nustep L1 UE and LE x 5 min       Knee/Hip Exercises: Supine   Quad Sets Strengthening;Left;1 set;20 reps    Quad Sets Limitations discomfort     Bridges Strengthening;Both;1 set;10 reps    Straight Leg Raises Strengthening;Left;Both;1 set;10 reps      Manual Therapy   Manual therapy comments pin and stretch to hamstrings     Soft tissue mobilization gastroc, hamstring, ITB in prone and sidelying, IASTM                   PT Education - 11/03/19 1147    Education Details potential causes of pain L LE, muscle tension, Dry needling, stretching as treatment    Person(s) Educated Patient    Methods Explanation;Demonstration;Handout;Verbal cues  Comprehension Verbalized understanding;Returned demonstration;Need further instruction;Verbal cues required            PT Short Term Goals - 10/28/19 1134      PT SHORT TERM GOAL #1   Title Pt will be I with initial HEP for knee/hip/core strength    Time 3    Period Weeks    Status New    Target Date 11/18/19      PT SHORT TERM GOAL #2   Title Pt will complete various balance and functional screen and set goals based on results.    Time 3    Period Weeks    Status New    Target Date 11/18/19             PT Long Term Goals - 10/28/19 1352      PT LONG TERM GOAL #1   Title Pt will be able to do >.5  mile walk without increased knee/hip pain due to improved strength and gait techniques    Time 6    Period Weeks    Status New    Target Date 12/09/19      PT LONG TERM GOAL #2   Title ind with advanced HEP    Time 6    Period Weeks    Status New    Target Date 12/09/19       PT LONG TERM GOAL #3   Title Pt will be able to scoot laterally in bench at restaurant in either direction    Time 6    Period Weeks    Status New    Target Date 12/09/19      PT LONG TERM GOAL #4   Title 5 x sit to stand < or = to 12 sec in order to place patient within age related norms.    Baseline 16.2    Time 6    Period Weeks    Status New    Target Date 12/09/19                 Plan - 11/03/19 1128    Clinical Impression Statement Patient hesitant for warming up on NuStep, encouraged her to try for muscle activation and warm up.  Did not do her HEP, but I encouraged her to do so in order to gain flexibility and mobility.  Painful trigger point in lateral hamstring and lateral gastroc, open to dry needling after discussion.    PT Treatment/Interventions ADLs/Self Care Home Management;Cryotherapy;Moist Heat;Iontophoresis 4mg /ml Dexamethasone;Gait training;Therapeutic activities;Functional mobility training;Stair training;Therapeutic exercise;Balance training;Neuromuscular re-education;Manual techniques;Patient/family education;Passive range of motion;Taping    PT Next Visit Plan check HEP, balance: TUG, Berg vs DGI. Pt prefers to NOT use equipment (ball, bands ok)    PT Home Exercise Plan SLR, bridge, quad set and hamstring stretch, calf stretch wall    Consulted and Agree with Plan of Care Patient           Patient will benefit from skilled therapeutic intervention in order to improve the following deficits and impairments:  Decreased activity tolerance, Decreased endurance, Decreased range of motion, Decreased strength, Hypomobility, Increased fascial restricitons, Pain, Impaired UE functional use, Difficulty walking, Decreased mobility, Decreased coordination, Decreased balance, Impaired tone  Visit Diagnosis: Chronic pain of both knees  Other lack of coordination  Difficulty in walking, not elsewhere classified  Stiffness of left knee, not elsewhere  classified  Stiffness of right knee, not elsewhere classified     Problem List Patient Active Problem List   Diagnosis  Date Noted  . Primary osteoarthritis of left knee 09/02/2019  . Prolapsed internal hemorrhoids, grade 2 03/03/2019  . Brain lesion 06/24/2017  . Osteoarthritis   . Multiple lesions on CT of brain and spine   . Movement disorder   . Lumbar spondylosis   . Hyperlipidemia   . DDD (degenerative disc disease), lumbar   . Colitis   . Dermatitis   . Cervical spondylosis   . Cervical disc disease   . Diastolic dysfunction 16/01/9603  . Syncope 04/18/2016  . Anxiety 04/18/2016  . Hypertension   . Autoimmune disorder-autoimmune encephalitis   . Dystonia 03/16/2015  . Coronary artery calcification of native artery 11/04/2014  . Abnormal finding on MRI of brain 10/04/2014  . Pruritus 11/30/2011  . 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    Lindsay Clark 11/03/2019, 11:53 AM  [facility] [address] [place], [place], [address] Phone: [phone]   Fax:  [phone]  Name: Lindsay Clark MRN: [id number] Date of Birth: June 19, 1940  Raeford Razor, PT 11/03/19 11:54 AM Phone: [phone] Fax: [phone]

## 2019-11-03 NOTE — Patient Instructions (Signed)
Access Code: [email or web address]: 07/13/2021Prepared by: Anderson Malta PaaExercises  Supine Quad Set - 2 x daily - 7 x weekly - 2 sets - 10 reps - 5 hold  Active Straight Leg Raise with Quad Set - 2 x daily - 7 x weekly - 2 sets - 10 reps - 5 hold  Supine Bridge - 2 x daily - 7 x weekly - 2 sets - 10 reps - 5 hold  Seated Hamstring Stretch - 2 x daily - 7 x weekly - 1 sets - 5 reps - 30 hold  Standing Gastroc Stretch - 1 x daily - 7 x weekly - 1 sets - 5 reps - 30 hold

## 2019-11-06 ENCOUNTER — Encounter: Payer: Self-pay | Admitting: Physical Therapy

## 2019-11-06 ENCOUNTER — Other Ambulatory Visit: Payer: Self-pay

## 2019-11-06 ENCOUNTER — Ambulatory Visit: Payer: Medicare PPO | Admitting: Physical Therapy

## 2019-11-06 DIAGNOSIS — M25562 Pain in left knee: Secondary | ICD-10-CM | POA: Diagnosis not present

## 2019-11-06 DIAGNOSIS — R278 Other lack of coordination: Secondary | ICD-10-CM | POA: Diagnosis not present

## 2019-11-06 DIAGNOSIS — R262 Difficulty in walking, not elsewhere classified: Secondary | ICD-10-CM

## 2019-11-06 DIAGNOSIS — M25662 Stiffness of left knee, not elsewhere classified: Secondary | ICD-10-CM

## 2019-11-06 DIAGNOSIS — G8929 Other chronic pain: Secondary | ICD-10-CM

## 2019-11-06 DIAGNOSIS — M25661 Stiffness of right knee, not elsewhere classified: Secondary | ICD-10-CM | POA: Diagnosis not present

## 2019-11-06 DIAGNOSIS — M25561 Pain in right knee: Secondary | ICD-10-CM | POA: Diagnosis not present

## 2019-11-06 NOTE — Therapy (Signed)
[place], [place], [address] Phone: [phone]   Fax:  [phone]  Physical Therapy Treatment  Patient Details  Name: Lindsay Clark MRN: [id number] Date of Birth: 1940-10-24 Referring Provider (PT): Dr. Ninfa Linden    Encounter Date: 11/06/2019   PT End of Session - 11/06/19 0924    Visit Number 3    Number of Visits 12    Date for PT Re-Evaluation 12/23/19    Authorization Type [employer]    PT Start Time 0924    PT Stop Time 0959    PT Time Calculation (min) 35 min    Activity Tolerance Patient tolerated treatment well    Behavior During Therapy [facility] for tasks assessed/performed           Past Medical History:  Diagnosis Date  . Atherosclerosis    mild cerebral  . Atrial fibrillation and flutter ([place])   . Autoimmune disorder ([place])   . Cervical disc disease    with spondylosis  . Cervical spondylosis   . Chronic dermatitis    eczematous  . Colitis   . DDD (degenerative disc disease), lumbar   . Frozen shoulder    left  . Hyperlipidemia   . Hypertension   . Lateral epicondylitis   . Lumbar spondylosis   . Movement disorder   . Multiple lesions on CT of brain and spine    reports lesions from previous CT scan at [facility]   . 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 CATARACT EXTRACTION    . COLONOSCOPY    . EYE SURGERY Left   . HEMORRHOID BANDING    . LOOP RECORDER INSERTION N/A 07/05/2017   Procedure: LOOP RECORDER INSERTION;  Surgeon: Thompson Grayer, MD;  Location: [place] [facility];  Service: Cardiovascular;  Laterality: N/A;  . TUBAL LIGATION  1980    There were no vitals filed for this visit.   Subjective Assessment - 11/06/19 0927    Subjective " I felt wonderful on Tuesday when I left, but Wednesday I was pretty sore which I think is normal.              [facility] PT Assessment - 11/06/19 0001      Assessment   Medical  Diagnosis knee OA     Referring Provider (PT) Dr. Dutch Gray Adult PT Treatment/Exercise - 11/06/19 0001      Knee/Hip Exercises: Stretches   Passive Hamstring Stretch 2 reps;Right;30 seconds    Gastroc Stretch 2 reps;30 seconds      Knee/Hip Exercises: Aerobic   Nustep L4 UE and LE x 5 min       Manual Therapy   Manual therapy comments skilled palpation and monitoring of pt throughout TPDN    Soft tissue mobilization IASTM along Lateral gastroc, peroneal longus, and distla bicep femoris            Trigger Point Dry Needling - 11/06/19 0001    Consent Given? Yes    Education Handout Provided Yes    Muscles Treated Lower Quadrant Gastrocnemius;Hamstring;Peroneals    Peroneals Response Twitch response elicited;Palpable increased muscle length   peroneal longus   Hamstring Response Twitch response elicited;Palpable increased muscle length   bicep femoris   Gastrocnemius Response Twitch response elicited;Palpable  increased muscle length   L lateral head               PT Education - 11/06/19 0941    Education Details muscle anatomy and referral patterns, What TPDN is, benefits and what to expect    Person(s) Educated Patient    Methods Explanation;Verbal cues    Comprehension Verbalized understanding;Verbal cues required            PT Short Term Goals - 10/28/19 1134      PT SHORT TERM GOAL #1   Title Pt will be I with initial HEP for knee/hip/core strength    Time 3    Period Weeks    Status New    Target Date 11/18/19      PT SHORT TERM GOAL #2   Title Pt will complete various balance and functional screen and set goals based on results.    Time 3    Period Weeks    Status New    Target Date 11/18/19             PT Long Term Goals - 10/28/19 1352      PT LONG TERM GOAL #1   Title Pt will be able to do >.5  mile walk without increased knee/hip pain due to improved strength and gait techniques    Time 6     Period Weeks    Status New    Target Date 12/09/19      PT LONG TERM GOAL #2   Title ind with advanced HEP    Time 6    Period Weeks    Status New    Target Date 12/09/19      PT LONG TERM GOAL #3   Title Pt will be able to scoot laterally in bench at restaurant in either direction    Time 6    Period Weeks    Status New    Target Date 12/09/19      PT LONG TERM GOAL #4   Title 5 x sit to stand < or = to 12 sec in order to place patient within age related norms.    Baseline 16.2    Time 6    Period Weeks    Status New    Target Date 12/09/19                 Plan - 11/06/19 1022    Clinical Impression Statement pt notes continued intemrittent sharp nerve pain that occurs when positiong on into her recliner at home. educated and consent was given for TPND focusing along the peroneal longus, lateral gastroc head and distal biceps femoris followed with IASTM technques and stretching. utilized exercise to promote muscle retraining following treatment. she noted feeling redcued stiffness following session.    PT Next Visit Plan check HEP, balance: TUG, Berg vs DGI. Pt prefers to NOT use equipment (ball, bands ok), response to DN    PT Home Exercise Plan SLR, bridge, quad set and hamstring stretch, calf stretch wall    Consulted and Agree with Plan of Care Patient           Patient will benefit from skilled therapeutic intervention in order to improve the following deficits and impairments:  Decreased activity tolerance, Decreased endurance, Decreased range of motion, Decreased strength, Hypomobility, Increased fascial restricitons, Pain, Impaired UE functional use, Difficulty walking, Decreased mobility, Decreased coordination, Decreased balance, Impaired tone  Visit Diagnosis: Chronic pain of both knees  Other lack  of coordination  Difficulty in walking, not elsewhere classified  Stiffness of left knee, not elsewhere classified  Stiffness of right knee, not  elsewhere classified     Problem List Patient Active Problem List   Diagnosis Date Noted  . Primary osteoarthritis of left knee 09/02/2019  . Prolapsed internal hemorrhoids, grade 2 03/03/2019  . Brain lesion 06/24/2017  . Osteoarthritis   . Multiple lesions on CT of brain and spine   . Movement disorder   . Lumbar spondylosis   . Hyperlipidemia   . DDD (degenerative disc disease), lumbar   . Colitis   . Dermatitis   . Cervical spondylosis   . Cervical disc disease   . Diastolic dysfunction 63/33/5456  . Syncope 04/18/2016  . Anxiety 04/18/2016  . Hypertension   . Autoimmune disorder-autoimmune encephalitis   . Dystonia 03/16/2015  . Coronary artery calcification of native artery 11/04/2014  . Abnormal finding on MRI of brain 10/04/2014  . Pruritus 11/30/2011  . 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   Starr Lake PT, DPT, LAT, ATC  11/06/19  10:27 AM      [facility] [address] [place], [place], [address] Phone: [phone]   Fax:  [phone]  Name: Lindsay Clark MRN: [id number] Date of Birth: 04/09/1941

## 2019-11-10 ENCOUNTER — Ambulatory Visit: Payer: Medicare PPO | Admitting: Physical Therapy

## 2019-11-10 ENCOUNTER — Encounter: Payer: Self-pay | Admitting: Physical Therapy

## 2019-11-10 ENCOUNTER — Other Ambulatory Visit: Payer: Self-pay

## 2019-11-10 DIAGNOSIS — M25661 Stiffness of right knee, not elsewhere classified: Secondary | ICD-10-CM | POA: Diagnosis not present

## 2019-11-10 DIAGNOSIS — M25562 Pain in left knee: Secondary | ICD-10-CM | POA: Diagnosis not present

## 2019-11-10 DIAGNOSIS — R278 Other lack of coordination: Secondary | ICD-10-CM | POA: Diagnosis not present

## 2019-11-10 DIAGNOSIS — M25561 Pain in right knee: Secondary | ICD-10-CM | POA: Diagnosis not present

## 2019-11-10 DIAGNOSIS — G8929 Other chronic pain: Secondary | ICD-10-CM | POA: Diagnosis not present

## 2019-11-10 DIAGNOSIS — R262 Difficulty in walking, not elsewhere classified: Secondary | ICD-10-CM | POA: Diagnosis not present

## 2019-11-10 DIAGNOSIS — M25662 Stiffness of left knee, not elsewhere classified: Secondary | ICD-10-CM | POA: Diagnosis not present

## 2019-11-10 NOTE — Therapy (Signed)
[place], [place], [address] Phone: [phone]   Fax:  [phone]  Physical Therapy Treatment  Patient Details  Name: Lindsay Clark MRN: [id number] Date of Birth: 04/07/1941 Referring Provider (PT): Dr. Ninfa Linden    Encounter Date: 11/10/2019   PT End of Session - 11/10/19 1103    Visit Number 4    Number of Visits 12    Date for PT Re-Evaluation 12/23/19    Authorization Type [employer]    PT Start Time 1103    PT Stop Time 1145    PT Time Calculation (min) 42 min    Activity Tolerance Patient tolerated treatment well    Behavior During Therapy [facility] for tasks assessed/performed           Past Medical History:  Diagnosis Date  . Atherosclerosis    mild cerebral  . Atrial fibrillation and flutter ([place])   . Autoimmune disorder ([place])   . Cervical disc disease    with spondylosis  . Cervical spondylosis   . Chronic dermatitis    eczematous  . Colitis   . DDD (degenerative disc disease), lumbar   . Frozen shoulder    left  . Hyperlipidemia   . Hypertension   . Lateral epicondylitis   . Lumbar spondylosis   . Movement disorder   . Multiple lesions on CT of brain and spine    reports lesions from previous CT scan at [facility]   . 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 CATARACT EXTRACTION    . COLONOSCOPY    . EYE SURGERY Left   . HEMORRHOID BANDING    . LOOP RECORDER INSERTION N/A 07/05/2017   Procedure: LOOP RECORDER INSERTION;  Surgeon: Thompson Grayer, MD;  Location: [place] [facility];  Service: Cardiovascular;  Laterality: N/A;  . TUBAL LIGATION  1980    There were no vitals filed for this visit.   Subjective Assessment - 11/10/19 1104    Subjective " I feel like the L knee won't work, and I am more stiff today. I was eating out over the weekend and had trouble getting out of the booth."    Currently in Pain? No/denies    Pain  Orientation Left    Pain Onset More than a month ago    Pain Frequency Intermittent              [facility] PT Assessment - 11/10/19 0001      Assessment   Medical Diagnosis knee OA     Referring Provider (PT) Dr. Dutch Gray Adult PT Treatment/Exercise - 11/10/19 0001      Self-Care   Self-Care Other Self-Care Comments      Therapeutic Activites    Therapeutic Activities Other Therapeutic Activities    Other Therapeutic Activities sit to stand from a chair focusing scooting forward, ankles behind knees and nose over toes.  1 x 15 , utilized 10# kettle bell x 8 reps to promote counterbalance   improved form with added weight     Manual Therapy   Manual Therapy Other (comment)    Manual therapy comments skilled palpation and monitoring of pt throughout TPDN    Soft tissue mobilization IASTM along Lateral gastroc, peroneal longus, and distla bicep femoris  Other Manual Therapy MTPR along proximal bicep femoris, and semi-membranosus   tack and stretch of the hamstring           Trigger Point Dry Needling - 11/10/19 0001    Consent Given? Yes    Education Handout Provided Previously provided    Hamstring Response Twitch response elicited;Palpable increased muscle length   L biceps femoris                  PT Short Term Goals - 10/28/19 1134      PT SHORT TERM GOAL #1   Title Pt will be I with initial HEP for knee/hip/core strength    Time 3    Period Weeks    Status New    Target Date 11/18/19      PT SHORT TERM GOAL #2   Title Pt will complete various balance and functional screen and set goals based on results.    Time 3    Period Weeks    Status New    Target Date 11/18/19             PT Long Term Goals - 10/28/19 1352      PT LONG TERM GOAL #1   Title Pt will be able to do >.5  mile walk without increased knee/hip pain due to improved strength and gait techniques    Time 6    Period Weeks    Status  New    Target Date 12/09/19      PT LONG TERM GOAL #2   Title ind with advanced HEP    Time 6    Period Weeks    Status New    Target Date 12/09/19      PT LONG TERM GOAL #3   Title Pt will be able to scoot laterally in bench at restaurant in either direction    Time 6    Period Weeks    Status New    Target Date 12/09/19      PT LONG TERM GOAL #4   Title 5 x sit to stand < or = to 12 sec in order to place patient within age related norms.    Baseline 16.2    Time 6    Period Weeks    Status New    Target Date 12/09/19                 Plan - 11/10/19 1150    Clinical Impression Statement pt noted limited improvement in the L knee since the last session. continued TPDN focusing today on the distal bicep femoris and performed MTPR along proximal hamstrings (due to clothing restriction). focused session on sit to stand biomechanics which she was able to complete but demontrated limitation from a lower surface.    PT Treatment/Interventions ADLs/Self Care Home Management;Cryotherapy;Moist Heat;Iontophoresis 4mg /ml Dexamethasone;Gait training;Therapeutic activities;Functional mobility training;Stair training;Therapeutic exercise;Balance training;Neuromuscular re-education;Manual techniques;Patient/family education;Passive range of motion;Taping    PT Next Visit Plan check HEP, balance: TUG, Berg vs DGI. Pt prefers to NOT use equipment (ball, bands ok), response to DN, how was sit to stand since last session, continue gross posterior hip strength.    PT Home Exercise Plan SLR, bridge, quad set and hamstring stretch, calf stretch wall    Consulted and Agree with Plan of Care Patient           Patient will benefit from skilled therapeutic intervention in order to improve the following deficits and impairments:  Decreased activity tolerance,  Decreased endurance, Decreased range of motion, Decreased strength, Hypomobility, Increased fascial restricitons, Pain, Impaired UE  functional use, Difficulty walking, Decreased mobility, Decreased coordination, Decreased balance, Impaired tone  Visit Diagnosis: Chronic pain of both knees  Other lack of coordination  Difficulty in walking, not elsewhere classified  Stiffness of left knee, not elsewhere classified  Stiffness of right knee, not elsewhere classified     Problem List Patient Active Problem List   Diagnosis Date Noted  . Primary osteoarthritis of left knee 09/02/2019  . Prolapsed internal hemorrhoids, grade 2 03/03/2019  . Brain lesion 06/24/2017  . Osteoarthritis   . Multiple lesions on CT of brain and spine   . Movement disorder   . Lumbar spondylosis   . Hyperlipidemia   . DDD (degenerative disc disease), lumbar   . Colitis   . Dermatitis   . Cervical spondylosis   . Cervical disc disease   . Diastolic dysfunction 23/76/2831  . Syncope 04/18/2016  . Anxiety 04/18/2016  . Hypertension   . Autoimmune disorder-autoimmune encephalitis   . Dystonia 03/16/2015  . Coronary artery calcification of native artery 11/04/2014  . Abnormal finding on MRI of brain 10/04/2014  . Pruritus 11/30/2011  . 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   Starr Lake PT, DPT, LAT, ATC  11/10/19  11:55 AM      [place] [address] [place], [place], [address] Phone: [phone]   Fax:  [phone]  Name: Lindsay Clark MRN: [id number] Date of Birth: 1940/12/02

## 2019-11-12 ENCOUNTER — Encounter: Payer: Self-pay | Admitting: Physical Therapy

## 2019-11-12 ENCOUNTER — Other Ambulatory Visit: Payer: Self-pay

## 2019-11-12 ENCOUNTER — Ambulatory Visit: Payer: Medicare PPO | Admitting: Physical Therapy

## 2019-11-12 DIAGNOSIS — R278 Other lack of coordination: Secondary | ICD-10-CM | POA: Diagnosis not present

## 2019-11-12 DIAGNOSIS — M25661 Stiffness of right knee, not elsewhere classified: Secondary | ICD-10-CM

## 2019-11-12 DIAGNOSIS — M25662 Stiffness of left knee, not elsewhere classified: Secondary | ICD-10-CM

## 2019-11-12 DIAGNOSIS — R262 Difficulty in walking, not elsewhere classified: Secondary | ICD-10-CM

## 2019-11-12 DIAGNOSIS — G8929 Other chronic pain: Secondary | ICD-10-CM

## 2019-11-12 DIAGNOSIS — M25561 Pain in right knee: Secondary | ICD-10-CM | POA: Diagnosis not present

## 2019-11-12 DIAGNOSIS — M25562 Pain in left knee: Secondary | ICD-10-CM | POA: Diagnosis not present

## 2019-11-12 NOTE — Therapy (Signed)
[place], [place], [address] Phone: [phone]   Fax:  [phone]  Physical Therapy Treatment  Patient Details  Name: Lindsay Clark MRN: [id number] Date of Birth: 15-Aug-1940 Referring Provider (PT): Dr. Ninfa Linden    Encounter Date: 11/12/2019   PT End of Session - 11/12/19 1113    Visit Number 5    Number of Visits 12    Date for PT Re-Evaluation 12/23/19    Authorization Type [employer]    PT Start Time 1102    PT Stop Time 1145    PT Time Calculation (min) 43 min    Activity Tolerance Patient tolerated treatment well    Behavior During Therapy WFL for tasks assessed/performed           Past Medical History:  Diagnosis Date   Atherosclerosis    mild cerebral   Atrial fibrillation and flutter ([place])    Autoimmune disorder ([place])    Cervical disc disease    with spondylosis   Cervical spondylosis    Chronic dermatitis    eczematous   Colitis    DDD (degenerative disc disease), lumbar    Frozen shoulder    left   Hyperlipidemia    Hypertension    Lateral epicondylitis    Lumbar spondylosis    Movement disorder    Multiple lesions on CT of brain and spine    reports lesions from previous CT scan at [facility]    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    Past Surgical History:  Procedure Laterality Date   CATARACT EXTRACTION     COLONOSCOPY     EYE SURGERY Left    HEMORRHOID BANDING     LOOP RECORDER INSERTION N/A 07/05/2017   Procedure: LOOP [place];  Surgeon: Thompson Grayer, MD;  Location: [place] [facility];  Service: Cardiovascular;  Laterality: N/A;   TUBAL LIGATION  1980    There were no vitals filed for this visit.   Subjective Assessment - 11/12/19 1104    Subjective " yesterday I was sore and my L knee wouldn't work again. I was at a funeral and I couldn't stand up"              [facility] PT Assessment - 11/12/19 0001      Assessment   Medical  Diagnosis knee OA     Referring Provider (PT) Dr. Dutch Gray Adult PT Treatment/Exercise - 11/12/19 0001      Knee/Hip Exercises: Stretches   Active Hamstring Stretch 2 reps;30 seconds      Knee/Hip Exercises: Seated   Other Seated Knee/Hip Exercises sit to stand with red band around the knees 2 x 10    Hamstring Curl Strengthening;Left;3 sets;10 reps   Multi-angle intial Range, mid range and end range   Hamstring Limitations oscillations with red theraband    Sit to Sand 1 set;without UE support   rolled towel under knee to replicate challeneges @ home     Manual Therapy   Manual Therapy Neural Stretch    Neural Stretch 1 x 10 in sitting, and in supine with ankles pumps in hamstring stretch position                  PT Education - 11/12/19 1159    Education Details updated HEP  Person(s) Educated Patient    Methods Explanation;Verbal cues;Handout    Comprehension Verbalized understanding;Verbal cues required            PT Short Term Goals - 10/28/19 1134      PT SHORT TERM GOAL #1   Title Pt will be I with initial HEP for knee/hip/core strength    Time 3    Period Weeks    Status New    Target Date 11/18/19      PT SHORT TERM GOAL #2   Title Pt will complete various balance and functional screen and set goals based on results.    Time 3    Period Weeks    Status New    Target Date 11/18/19             PT Long Term Goals - 10/28/19 1352      PT LONG TERM GOAL #1   Title Pt will be able to do >.5  mile walk without increased knee/hip pain due to improved strength and gait techniques    Time 6    Period Weeks    Status New    Target Date 12/09/19      PT LONG TERM GOAL #2   Title ind with advanced HEP    Time 6    Period Weeks    Status New    Target Date 12/09/19      PT LONG TERM GOAL #3   Title Pt will be able to scoot laterally in bench at restaurant in either direction    Time 6    Period  Weeks    Status New    Target Date 12/09/19      PT LONG TERM GOAL #4   Title 5 x sit to stand < or = to 12 sec in order to place patient within age related norms.    Baseline 16.2    Time 6    Period Weeks    Status New    Target Date 12/09/19                 Plan - 11/12/19 1155    Clinical Impression Statement pt reports no changes in the lateral knee pain she has had and continues to note difficulty with standing from a seated position with her knees are higher than her hips (pew at a funeral). held off on TPDN today and focused on nerve glides which she demonstrated increased tension / soreness in the distal postierior knee during gentle nerve stretch/ glides. Continued  difficulty reproducing concordant pain in the lateral aspect of the knee, despite replicating position and mechanism of aggrivation.  practiced sit to stand with the elevation of knees compared to hips, and engaged glute medius using band. She did well with exercises noting no increase in pain or aggrivation following session.    PT Treatment/Interventions ADLs/Self Care Home Management;Cryotherapy;Moist Heat;Iontophoresis 4mg /ml Dexamethasone;Gait training;Therapeutic activities;Functional mobility training;Stair training;Therapeutic exercise;Balance training;Neuromuscular re-education;Manual techniques;Patient/family education;Passive range of motion;Taping    PT Next Visit Plan check HEP, balance: TUG, Berg vs DGI. Pt prefers to NOT use equipment (ball, bands ok), response to DN, how was sit to stand since last session, continue gross posterior hip strength. how were nerve glides?    PT Home Exercise Plan SLR, bridge, quad set and hamstring stretch, calf stretch wall, sit to stand with band around knees, hamstring curl (seated), supine and seated nerve glides,           Patient will  benefit from skilled therapeutic intervention in order to improve the following deficits and impairments:  Decreased activity  tolerance, Decreased endurance, Decreased range of motion, Decreased strength, Hypomobility, Increased fascial restricitons, Pain, Impaired UE functional use, Difficulty walking, Decreased mobility, Decreased coordination, Decreased balance, Impaired tone  Visit Diagnosis: Chronic pain of both knees  Other lack of coordination  Difficulty in walking, not elsewhere classified  Stiffness of left knee, not elsewhere classified  Stiffness of right knee, not elsewhere classified     Problem List Patient Active Problem List   Diagnosis Date Noted   Primary osteoarthritis of left knee 09/02/2019   Prolapsed internal hemorrhoids, grade 2 03/03/2019   Brain lesion 06/24/2017   Osteoarthritis    Multiple lesions on CT of brain and spine    Movement disorder    Lumbar spondylosis    Hyperlipidemia    DDD (degenerative disc disease), lumbar    Colitis    Dermatitis    Cervical spondylosis    Cervical disc disease    Diastolic dysfunction 60/15/6153   Syncope 04/18/2016   Anxiety 04/18/2016   Hypertension    Autoimmune disorder-autoimmune encephalitis    Dystonia 03/16/2015   Coronary artery calcification of native artery 11/04/2014   Abnormal finding on MRI of brain 10/04/2014   Pruritus 79/43/2761   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   Starr Lake PT, DPT, LAT, ATC  11/12/19  12:03 PM      [facility] [address] [place], [place], [address] Phone: [phone]   Fax:  [phone]  Name: Lindsay Clark MRN: [id number] Date of Birth: 09-19-1940

## 2019-11-17 ENCOUNTER — Encounter: Payer: Self-pay | Admitting: Physical Therapy

## 2019-11-17 ENCOUNTER — Ambulatory Visit: Payer: Medicare PPO | Admitting: Physical Therapy

## 2019-11-17 ENCOUNTER — Other Ambulatory Visit: Payer: Self-pay

## 2019-11-17 DIAGNOSIS — M25661 Stiffness of right knee, not elsewhere classified: Secondary | ICD-10-CM

## 2019-11-17 DIAGNOSIS — M25662 Stiffness of left knee, not elsewhere classified: Secondary | ICD-10-CM

## 2019-11-17 DIAGNOSIS — G8929 Other chronic pain: Secondary | ICD-10-CM

## 2019-11-17 DIAGNOSIS — R278 Other lack of coordination: Secondary | ICD-10-CM

## 2019-11-17 DIAGNOSIS — M25562 Pain in left knee: Secondary | ICD-10-CM | POA: Diagnosis not present

## 2019-11-17 DIAGNOSIS — M25561 Pain in right knee: Secondary | ICD-10-CM | POA: Diagnosis not present

## 2019-11-17 DIAGNOSIS — R262 Difficulty in walking, not elsewhere classified: Secondary | ICD-10-CM

## 2019-11-17 NOTE — Therapy (Signed)
[place], [place], [address] Phone: [phone]   Fax:  [phone]  Physical Therapy Treatment  Patient Details  Name: Lindsay Clark MRN: [id number] Date of Birth: 05/07/1940 Referring Provider (PT): Dr. Ninfa Linden    Encounter Date: 11/17/2019   PT End of Session - 11/17/19 1102    Visit Number 6    Number of Visits 12    Date for PT Re-Evaluation 12/23/19    Authorization Type [employer]    PT Start Time 1102    PT Stop Time 1148    PT Time Calculation (min) 46 min    Activity Tolerance Patient tolerated treatment well    Behavior During Therapy [facility] for tasks assessed/performed           Past Medical History:  Diagnosis Date  . Atherosclerosis    mild cerebral  . Atrial fibrillation and flutter ([place])   . Autoimmune disorder ([place])   . Cervical disc disease    with spondylosis  . Cervical spondylosis   . Chronic dermatitis    eczematous  . Colitis   . DDD (degenerative disc disease), lumbar   . Frozen shoulder    left  . Hyperlipidemia   . Hypertension   . Lateral epicondylitis   . Lumbar spondylosis   . Movement disorder   . Multiple lesions on CT of brain and spine    reports lesions from [facility] CT [facility] at [facility]   . 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 CATARACT EXTRACTION    . COLONOSCOPY    . EYE SURGERY Left   . HEMORRHOID BANDING    . LOOP RECORDER INSERTION N/A 07/05/2017   Procedure: LOOP RECORDER INSERTION;  Surgeon: Thompson Grayer, MD;  Location: [facility];  Service: Cardiovascular;  Laterality: N/A;  . TUBAL LIGATION  1980    There were no vitals filed for this visit.   Subjective Assessment - 11/17/19 1105    Subjective "reaching out in front is helpful to get up from a seated position, but I was alittle sore int he back and the top of the knees but is better."    Pain Score 0-No pain               [facility] PT Assessment - 11/17/19 0001      Assessment   Medical Diagnosis knee OA     Referring Provider (PT) Dr. Dutch Gray Adult PT Treatment/Exercise - 11/17/19 0001      Knee/Hip Exercises: Stretches   Active Hamstring Stretch 3 reps;Left;30 seconds   PNF contract/ relax     Knee/Hip Exercises: Standing   Forward Step Up Left;1 set;10 reps;Step Height: 6"    Forward Step Up Limitations reproduced L knee pain along the lateral patellofemoral joint, she noted decreased pain with knee / patellar stability      Knee/Hip Exercises: Seated   Sit to Sand 2 sets;10 reps   LLE/RLE advanced slightly to biase contralateral limb     Manual Therapy   Manual Therapy Taping;Joint mobilization    Joint Mobilization proximal fibualr AP/PA mobs grade III    McConnell L knee Lateral > Medial  PT Education - 11/17/19 1319    Education Details reviewed the patellofemoral joing and benefits of McConnel taping and benefits.    Person(s) Educated Patient    Methods Explanation;Verbal cues;Handout    Comprehension Verbalized understanding;Verbal cues required            PT Short Term Goals - 10/28/19 1134      PT SHORT TERM GOAL #1   Title Pt will be I with initial HEP for knee/hip/core strength    Time 3    Period Weeks    Status New    Target Date 11/18/19      PT SHORT TERM GOAL #2   Title Pt will complete various balance and functional screen and set goals based on results.    Time 3    Period Weeks    Status New    Target Date 11/18/19             PT Long Term Goals - 10/28/19 1352      PT LONG TERM GOAL #1   Title Pt will be able to do >.5  mile walk without increased knee/hip pain due to improved strength and gait techniques    Time 6    Period Weeks    Status New    Target Date 12/09/19      PT LONG TERM GOAL #2   Title ind with advanced HEP    Time 6    Period Weeks    Status New    Target Date  12/09/19      PT LONG TERM GOAL #3   Title Pt will be able to scoot laterally in bench at restaurant in either direction    Time 6    Period Weeks    Status New    Target Date 12/09/19      PT LONG TERM GOAL #4   Title 5 x sit to stand < or = to 12 sec in order to place patient within age related norms.    Baseline 16.2    Time 6    Period Weeks    Status New    Target Date 12/09/19                 Plan - 11/17/19 1320    Clinical Impression Statement pt reports having intermittent quad tendon pain at the base of the patellar and soreness inthe low back since the previous session but reports no pain today. She does report no L lateral knee concordant symptoms since the last session, and has been consistent with her nerve glides. She demonstrates increased patellofemoral symptoms with step up/ down which improved with knee/ patellar stability. Trialed McConnel taping for the L knee which she noted potential improvement with walking/ standing. she would benefit from continued physical therapy to decrease L knee pain, improve gross hip/ knee strength and maximize overall function.    PT Treatment/Interventions ADLs/Self Care Home Management;Cryotherapy;Moist Heat;Iontophoresis 4mg /ml Dexamethasone;Gait training;Therapeutic activities;Functional mobility training;Stair training;Therapeutic exercise;Balance training;Neuromuscular re-education;Manual techniques;Patient/family education;Passive range of motion;Taping    PT Next Visit Plan updated HEP,Pt prefers to NOT use equipment (ball, bands ok), how was McConnel taping, patellar stability exercises.    PT Home Exercise Plan SLR, bridge, quad set and hamstring stretch, calf stretch wall, sit to stand with band around knees, hamstring curl (seated), supine and seated nerve glides,           Patient will benefit from skilled therapeutic intervention in order to improve the following  deficits and impairments:  Decreased activity  tolerance, Decreased endurance, Decreased range of motion, Decreased strength, Hypomobility, Increased fascial restricitons, Pain, Impaired UE functional use, Difficulty walking, Decreased mobility, Decreased coordination, Decreased balance, Impaired tone  Visit Diagnosis: Chronic pain of both knees  Other lack of coordination  Difficulty in walking, not elsewhere classified  Stiffness of left knee, not elsewhere classified  Stiffness of right knee, not elsewhere classified     Problem List Patient Active Problem List   Diagnosis Date Noted  . Primary osteoarthritis of left knee 09/02/2019  . Prolapsed internal hemorrhoids, grade 2 03/03/2019  . Brain lesion 06/24/2017  . Osteoarthritis   . Multiple lesions on CT of brain and spine   . Movement disorder   . Lumbar spondylosis   . Hyperlipidemia   . DDD (degenerative disc disease), lumbar   . Colitis   . Dermatitis   . Cervical spondylosis   . Cervical disc disease   . Diastolic dysfunction 09/81/1914  . Syncope 04/18/2016  . Anxiety 04/18/2016  . Hypertension   . Autoimmune disorder-autoimmune encephalitis   . Dystonia 03/16/2015  . Coronary artery calcification of native artery 11/04/2014  . Abnormal finding on MRI of brain 10/04/2014  . Pruritus 11/30/2011  . 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   Starr Lake PT, DPT, LAT, ATC  11/17/19  1:27 PM      [facility] [address] [place], [place], [address] Phone: [phone]   Fax:  [phone]  Name: Lindsay Clark MRN: [id number] Date of Birth: 1941-01-13

## 2019-11-19 ENCOUNTER — Other Ambulatory Visit: Payer: Self-pay

## 2019-11-19 ENCOUNTER — Encounter: Payer: Self-pay | Admitting: Physical Therapy

## 2019-11-19 ENCOUNTER — Ambulatory Visit: Payer: Medicare PPO | Admitting: Physical Therapy

## 2019-11-19 DIAGNOSIS — M25662 Stiffness of left knee, not elsewhere classified: Secondary | ICD-10-CM

## 2019-11-19 DIAGNOSIS — R278 Other lack of coordination: Secondary | ICD-10-CM

## 2019-11-19 DIAGNOSIS — M25562 Pain in left knee: Secondary | ICD-10-CM

## 2019-11-19 DIAGNOSIS — R262 Difficulty in walking, not elsewhere classified: Secondary | ICD-10-CM

## 2019-11-19 DIAGNOSIS — M25661 Stiffness of right knee, not elsewhere classified: Secondary | ICD-10-CM | POA: Diagnosis not present

## 2019-11-19 DIAGNOSIS — G8929 Other chronic pain: Secondary | ICD-10-CM | POA: Diagnosis not present

## 2019-11-19 DIAGNOSIS — M25561 Pain in right knee: Secondary | ICD-10-CM | POA: Diagnosis not present

## 2019-11-19 NOTE — Therapy (Signed)
[place], [place], [address] Phone: [phone]   Fax:  [phone]  Physical Therapy Treatment  Patient Details  Name: Lindsay Clark MRN: [id number] Date of Birth: October 18, 1940 Referring Provider (PT): Dr. Ninfa Linden    Encounter Date: 11/19/2019   PT End of Session - 11/19/19 1101    Visit Number 7    Number of Visits 12    Date for PT Re-Evaluation 12/23/19    Authorization Type [employer]    PT Start Time 1101    PT Stop Time 1144    PT Time Calculation (min) 43 min    Activity Tolerance Patient tolerated treatment well    Behavior During Therapy [facility] for tasks assessed/performed           Past Medical History:  Diagnosis Date  . Atherosclerosis    mild cerebral  . Atrial fibrillation and flutter ([place])   . Autoimmune disorder ([place])   . Cervical disc disease    with spondylosis  . Cervical spondylosis   . Chronic dermatitis    eczematous  . Colitis   . DDD (degenerative disc disease), lumbar   . Frozen shoulder    left  . Hyperlipidemia   . Hypertension   . Lateral epicondylitis   . Lumbar spondylosis   . Movement disorder   . Multiple lesions on CT of brain and spine    reports lesions from [facility] CT scan at [facility]   . 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 CATARACT EXTRACTION    . COLONOSCOPY    . EYE SURGERY Left   . HEMORRHOID BANDING    . LOOP RECORDER INSERTION N/A 07/05/2017   Procedure: LOOP RECORDER INSERTION;  Surgeon: Thompson Grayer, MD;  Location: [facility];  Service: Cardiovascular;  Laterality: N/A;  . TUBAL LIGATION  1980    There were no vitals filed for this visit.       [facility] PT Assessment - 11/19/19 0001      Assessment   Medical Diagnosis knee OA     Referring Provider (PT) Dr. Dutch Gray Adult PT Treatment/Exercise - 11/19/19 0001      Knee/Hip  Exercises: Supine   Short Arc Quad Sets Strengthening;Left;2 sets;10 reps   with ball squeeze for VMO activation   Other Supine Knee/Hip Exercises clams LLE only with blue theraband 2 x 10      Knee/Hip Exercises: Sidelying   Hip ABduction 2 sets;Left;Strengthening;10 reps    Hip ABduction Limitations DC"d and modified to supine single clamshell due to increased soreness on the R hip      Knee/Hip Exercises: Prone   Hip Extension Strengthening;2 sets;10 reps      Manual Therapy   Manual therapy comments MTPR along the vastus lateralis x 4 distal to proximal    Joint Mobilization Low load long duration medial patellar glide    Soft tissue mobilization DTM along the vastus lateralis    McConnell L knee Lateral > Medial                  PT Education - 11/19/19 1238    Education Details reviewed patellofemoral joint and surrounding musculature. updated HEP today.    Person(s) Educated Patient    Methods Explanation;Verbal  cues;Handout    Comprehension Verbalized understanding;Verbal cues required            PT Short Term Goals - 10/28/19 1134      PT SHORT TERM GOAL #1   Title Pt will be I with initial HEP for knee/hip/core strength    Time 3    Period Weeks    Status New    Target Date 11/18/19      PT SHORT TERM GOAL #2   Title Pt will complete various balance and functional screen and set goals based on results.    Time 3    Period Weeks    Status New    Target Date 11/18/19             PT Long Term Goals - 10/28/19 1352      PT LONG TERM GOAL #1   Title Pt will be able to do >.5  mile walk without increased knee/hip pain due to improved strength and gait techniques    Time 6    Period Weeks    Status New    Target Date 12/09/19      PT LONG TERM GOAL #2   Title ind with advanced HEP    Time 6    Period Weeks    Status New    Target Date 12/09/19      PT LONG TERM GOAL #3   Title Pt will be able to scoot laterally in bench at restaurant in  either direction    Time 6    Period Weeks    Status New    Target Date 12/09/19      PT LONG TERM GOAL #4   Title 5 x sit to stand < or = to 12 sec in order to place patient within age related norms.    Baseline 16.2    Time 6    Period Weeks    Status New    Target Date 12/09/19                 Plan - 11/19/19 1240    Clinical Impression Statement pt reported tape helped previously but had eventually rolled off onto her pants. Focused session on the L patellofemoral joint with MTPR along the vastus lateralis followed with DTM. Continued application of McConnel taping and knee strengthening to promote medial patellar support.    PT Treatment/Interventions ADLs/Self Care Home Management;Cryotherapy;Moist Heat;Iontophoresis 4mg /ml Dexamethasone;Gait training;Therapeutic activities;Functional mobility training;Stair training;Therapeutic exercise;Balance training;Neuromuscular re-education;Manual techniques;Patient/family education;Passive range of motion;Taping    PT Next Visit Plan updated HEP,Pt prefers to NOT use equipment (ball, bands ok), how was McConnel taping, patellar stability exercises.    PT Home Exercise Plan SLR, bridge, quad set and hamstring stretch, calf stretch wall, sit to stand with band around knees, hamstring curl (seated), supine and seated nerve glides,    Consulted and Agree with Plan of Care Patient           Patient will benefit from skilled therapeutic intervention in order to improve the following deficits and impairments:  Decreased activity tolerance, Decreased endurance, Decreased range of motion, Decreased strength, Hypomobility, Increased fascial restricitons, Pain, Impaired UE functional use, Difficulty walking, Decreased mobility, Decreased coordination, Decreased balance, Impaired tone  Visit Diagnosis: Chronic pain of both knees  Other lack of coordination  Difficulty in walking, not elsewhere classified  Stiffness of left knee, not  elsewhere classified  Stiffness of right knee, not elsewhere classified     Problem List Patient  Active Problem List   Diagnosis Date Noted  . Primary osteoarthritis of left knee 09/02/2019  . Prolapsed internal hemorrhoids, grade 2 03/03/2019  . Brain lesion 06/24/2017  . Osteoarthritis   . Multiple lesions on CT of brain and spine   . Movement disorder   . Lumbar spondylosis   . Hyperlipidemia   . DDD (degenerative disc disease), lumbar   . Colitis   . Dermatitis   . Cervical spondylosis   . Cervical disc disease   . Diastolic dysfunction 53/61/4431  . Syncope 04/18/2016  . Anxiety 04/18/2016  . Hypertension   . Autoimmune disorder-autoimmune encephalitis   . Dystonia 03/16/2015  . Coronary artery calcification of native artery 11/04/2014  . Abnormal finding on MRI of brain 10/04/2014  . Pruritus 11/30/2011  . 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    Starr Lake PT, DPT, LAT, ATC  11/19/19  12:43 PM      [facility] [facility] [address] [place], [place], [address] Phone: [phone]   Fax:  [phone]  Name: Lindsay Clark MRN: [id number] Date of Birth: Oct 28, 1940

## 2019-11-23 ENCOUNTER — Other Ambulatory Visit: Payer: Self-pay

## 2019-11-23 ENCOUNTER — Encounter: Payer: Self-pay | Admitting: Physical Therapy

## 2019-11-23 ENCOUNTER — Ambulatory Visit: Payer: Medicare PPO | Attending: Physician Assistant | Admitting: Physical Therapy

## 2019-11-23 DIAGNOSIS — M25662 Stiffness of left knee, not elsewhere classified: Secondary | ICD-10-CM | POA: Diagnosis not present

## 2019-11-23 DIAGNOSIS — R278 Other lack of coordination: Secondary | ICD-10-CM | POA: Diagnosis not present

## 2019-11-23 DIAGNOSIS — G8929 Other chronic pain: Secondary | ICD-10-CM | POA: Diagnosis not present

## 2019-11-23 DIAGNOSIS — M25561 Pain in right knee: Secondary | ICD-10-CM | POA: Diagnosis not present

## 2019-11-23 DIAGNOSIS — M25661 Stiffness of right knee, not elsewhere classified: Secondary | ICD-10-CM | POA: Diagnosis not present

## 2019-11-23 DIAGNOSIS — R262 Difficulty in walking, not elsewhere classified: Secondary | ICD-10-CM

## 2019-11-23 DIAGNOSIS — M25562 Pain in left knee: Secondary | ICD-10-CM | POA: Diagnosis not present

## 2019-11-23 NOTE — Therapy (Signed)
[place], [place], [address] Phone: [phone]   Fax:  [phone]  Physical Therapy Treatment  Patient Details  Name: Lindsay Clark MRN: [id number] Date of Birth: [birth year] Referring Provider (PT): Dr. Ninfa Linden    Encounter Date: 11/23/2019   PT End of Session - 11/23/19 1104    Visit Number 8    Number of Visits 12    Date for PT Re-Evaluation 12/23/19    Authorization Type [employer]    PT Start Time 1103    PT Stop Time 1145    PT Time Calculation (min) 42 min           Past Medical History:  Diagnosis Date  . Atherosclerosis    mild cerebral  . Atrial fibrillation and flutter ([place])   . Autoimmune disorder ([place])   . Cervical disc disease    with spondylosis  . Cervical spondylosis   . Chronic dermatitis    eczematous  . Colitis   . DDD (degenerative disc disease), lumbar   . Frozen shoulder    left  . Hyperlipidemia   . Hypertension   . Lateral epicondylitis   . Lumbar spondylosis   . Movement disorder   . Multiple lesions on CT of brain and spine    reports lesions from previous CT scan at [facility]   . 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 CATARACT EXTRACTION    . COLONOSCOPY    . EYE SURGERY Left   . HEMORRHOID BANDING    . LOOP RECORDER INSERTION N/A 07/05/2017   Procedure: LOOP RECORDER INSERTION;  Surgeon: Thompson Grayer, MD;  Location: [facility];  Service: Cardiovascular;  Laterality: N/A;  . TUBAL LIGATION  1980    There were no vitals filed for this visit.   Subjective Assessment - 11/23/19 1106    Subjective " Everything feels llike it has gottnen worse. I have an appointment with the neurologist. The pain is more on the outside of the leg and its just there."              Lindsay Clark PT Assessment - 11/23/19 0001      Assessment   Medical Diagnosis knee OA     Referring Provider (PT) Dr. Dutch Gray Adult PT Treatment/Exercise - 11/23/19 0001      Therapeutic Activites    Other Therapeutic Activities getting up from the table mimicking getting out of the booth, 1 x 5 getting up turning body and rocking forward, and in situations turing toward the booth scooting bottom out and using hands to push up x 2      Knee/Hip Exercises: Stretches   Active Hamstring Stretch 2 reps;Left;30 seconds    Other Knee/Hip Stretches glute med stretch 2 x 30  in suine with stap      Modalities   Modalities Iontophoresis      Iontophoresis   Type of Iontophoresis Dexamethasone    Location L greater trochanter    Dose 4mg /ml    Time 6 hour patch      Manual Therapy   Manual therapy comments MTPR along the glute med x 4   with graduated pressure to tolerance   Joint Mobilization Low load long duration medial patellar glide  Soft tissue mobilization DTM along the glute med                    PT Short Term Goals - 10/28/19 1134      PT SHORT TERM GOAL #1   Title Pt will be I with initial HEP for knee/hip/core strength    Time 3    Period Weeks    Status New    Target Date 11/18/19      PT SHORT TERM GOAL #2   Title Pt will complete various balance and functional screen and set goals based on results.    Time 3    Period Weeks    Status New    Target Date 11/18/19             PT Long Term Goals - 10/28/19 1352      PT LONG TERM GOAL #1   Title Pt will be able to do >.5  mile walk without increased knee/hip pain due to improved strength and gait techniques    Time 6    Period Weeks    Status New    Target Date 12/09/19      PT LONG TERM GOAL #2   Title ind with advanced HEP    Time 6    Period Weeks    Status New    Target Date 12/09/19      PT LONG TERM GOAL #3   Title Pt will be able to scoot laterally in bench at restaurant in either direction    Time 6    Period Weeks    Status New    Target Date 12/09/19      PT LONG  TERM GOAL #4   Title 5 x sit to stand < or = to 12 sec in order to place patient within age related norms.    Baseline 16.2    Time 6    Period Weeks    Status New    Target Date 12/09/19                 Plan - 11/23/19 1240    Clinical Impression Statement Lindsay Clark notes having increased LLE pain that goes from the knee to the greater trochanter worse at night and relieved with heat and walking around. worked on STW along the [place] and continued patellar LLD medial glides. practiced getting up from a lower position mimicking getting out of a booth and ways she can use to get out of the booth. applied ionto patch to the greater trochanter end of session.    PT Next Visit Plan ERO, FOTO, goals, ,Pt prefers to NOT use equipment (ball, bands ok), how was McConnel taping, patellar stability exercises.    PT Home Exercise Plan SLR, bridge, quad set and hamstring stretch, calf stretch wall, sit to stand with band around knees, hamstring curl (seated), supine and seated nerve glides,           Patient will benefit from skilled therapeutic intervention in order to improve the following deficits and impairments:  Decreased activity tolerance, Decreased endurance, Decreased range of motion, Decreased strength, Hypomobility, Increased fascial restricitons, Pain, Impaired UE functional use, Difficulty walking, Decreased mobility, Decreased coordination, Decreased balance, Impaired tone  Visit Diagnosis: Chronic pain of both knees  Other lack of coordination  Difficulty in walking, not elsewhere classified  Stiffness of left knee, not elsewhere classified  Stiffness of right knee, not elsewhere classified  Problem List Patient Active Problem List   Diagnosis Date Noted  . Primary osteoarthritis of left knee 09/02/2019  . Prolapsed internal hemorrhoids, grade 2 03/03/2019  . Brain lesion 06/24/2017  . Osteoarthritis   . Multiple lesions on CT of brain and spine   .  Movement disorder   . Lumbar spondylosis   . Hyperlipidemia   . DDD (degenerative disc disease), lumbar   . Colitis   . Dermatitis   . Cervical spondylosis   . Cervical disc disease   . Diastolic dysfunction 45/62/5638  . Syncope 04/18/2016  . Anxiety 04/18/2016  . Hypertension   . Autoimmune disorder-autoimmune encephalitis   . Dystonia 03/16/2015  . Coronary artery calcification of native artery 11/04/2014  . Abnormal finding on MRI of brain 10/04/2014  . Pruritus 11/30/2011  . 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   Starr Lake PT, DPT, LAT, ATC  11/23/19  12:44 PM      [place] [facility] [address] [place], [place], [address] Phone: [phone]   Fax:  [phone]  Name: Lindsay Clark MRN: [id number] Date of Birth: 1940-07-05

## 2019-11-23 NOTE — Patient Instructions (Signed)

## 2019-11-24 ENCOUNTER — Other Ambulatory Visit: Payer: Self-pay | Admitting: Cardiology

## 2019-11-24 DIAGNOSIS — G249 Dystonia, unspecified: Secondary | ICD-10-CM | POA: Diagnosis not present

## 2019-11-25 DIAGNOSIS — Z124 Encounter for screening for malignant neoplasm of cervix: Secondary | ICD-10-CM | POA: Diagnosis not present

## 2019-11-25 DIAGNOSIS — N760 Acute vaginitis: Secondary | ICD-10-CM | POA: Diagnosis not present

## 2019-11-25 NOTE — Telephone Encounter (Signed)
Prescription refill request for Eliquis received. Indication:  Atrial Fibrillation Last office visit:  10/27/2019 [place] Scr: 0.71 05/13/2019 Age: 79 Weight:  95.4 kg  Prescription refilled

## 2019-11-26 ENCOUNTER — Other Ambulatory Visit: Payer: Self-pay

## 2019-11-26 ENCOUNTER — Ambulatory Visit: Payer: Medicare PPO | Admitting: Physical Therapy

## 2019-11-26 ENCOUNTER — Encounter: Payer: Self-pay | Admitting: Physical Therapy

## 2019-11-26 DIAGNOSIS — M25661 Stiffness of right knee, not elsewhere classified: Secondary | ICD-10-CM | POA: Diagnosis not present

## 2019-11-26 DIAGNOSIS — M25662 Stiffness of left knee, not elsewhere classified: Secondary | ICD-10-CM | POA: Diagnosis not present

## 2019-11-26 DIAGNOSIS — R278 Other lack of coordination: Secondary | ICD-10-CM

## 2019-11-26 DIAGNOSIS — R262 Difficulty in walking, not elsewhere classified: Secondary | ICD-10-CM | POA: Diagnosis not present

## 2019-11-26 DIAGNOSIS — G8929 Other chronic pain: Secondary | ICD-10-CM | POA: Diagnosis not present

## 2019-11-26 DIAGNOSIS — M25561 Pain in right knee: Secondary | ICD-10-CM | POA: Diagnosis not present

## 2019-11-26 DIAGNOSIS — M25562 Pain in left knee: Secondary | ICD-10-CM | POA: Diagnosis not present

## 2019-11-26 NOTE — Therapy (Signed)
[place], [place], [address] Phone: [phone]   Fax:  [phone]  Physical Therapy Treatment / Discharge  Patient Details  Name: Lindsay Clark MRN: [id number] Date of Birth: [birth year] Referring Provider (PT): Dr. Ninfa Linden    Encounter Date: 11/26/2019   PT End of Session - 11/26/19 1100    Visit Number 9    Number of Visits 12    Date for PT Re-Evaluation 12/23/19    Authorization Type [employer]    PT Start Time 1100    PT Stop Time 1145    PT Time Calculation (min) 45 min    Activity Tolerance Patient tolerated treatment well    Behavior During Therapy [facility] for tasks assessed/performed           Past Medical History:  Diagnosis Date  . Atherosclerosis    mild cerebral  . Atrial fibrillation and flutter ([place])   . Autoimmune disorder ([place])   . Cervical disc disease    with spondylosis  . Cervical spondylosis   . Chronic dermatitis    eczematous  . Colitis   . DDD (degenerative disc disease), lumbar   . Frozen shoulder    left  . Hyperlipidemia   . Hypertension   . Lateral epicondylitis   . Lumbar spondylosis   . Movement disorder   . Multiple lesions on CT of brain and spine    reports lesions from previous CT scan at [facility]   . 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 CATARACT EXTRACTION    . COLONOSCOPY    . EYE SURGERY Left   . HEMORRHOID BANDING    . LOOP RECORDER INSERTION N/A 07/05/2017   Procedure: LOOP RECORDER INSERTION;  Surgeon: Thompson Grayer, MD;  Location: [facility];  Service: Cardiovascular;  Laterality: N/A;  . TUBAL LIGATION  1980    There were no vitals filed for this visit.   Subjective Assessment - 11/26/19 1102    Subjective " I am feeling better today more than I have in a long time. but Yesterday I had a lot of issues with that exposed nerve. I saw the neurologist and they plan to do the nerve  conduction but I want to do it in [place]."    Diagnostic tests XR showed medial compartment degenerative  and PF as well    Currently in Pain? No/denies    Pain Orientation Left    Aggravating Factors  laying at nihgt,              [facility] PT Assessment - 11/26/19 0001      Assessment   Medical Diagnosis knee OA     Referring Provider (PT) Dr. Ninfa Linden       Observation/Other Assessments   Focus on Therapeutic Outcomes (FOTO)  50% limited      Standardized Balance Assessment   Standardized Balance Assessment Five Times Sit to Stand    Five times sit to stand comments  11 seconds from table                         [facility] Adult PT Treatment/Exercise - 11/26/19 0001      Self-Care   Self-Care Other Self-Care Comments    Other Self-Care Comments  reviewed doing seated hasmtring stretch, interimittent LAQ/ hip marching while seated or before standing to loosen hip/ knee to faciliate ease  of transition      Therapeutic Activites    Other Therapeutic Activities scooting along table mimicking booth, 2 x length of table 2nd set with table lowered      Knee/Hip Exercises: Stretches   ITB Stretch Left;2 reps;30 seconds    Other Knee/Hip Stretches glute med stretch 1 x 30 with L hip against the wall      Knee/Hip Exercises: Standing   Wall Squat 1 set   6 reps, mini squat     Knee/Hip Exercises: Seated   Sit to Sand 1 set;5 reps   from lowered table.                  PT Education - 11/26/19 1147    Education Details Reviewed HEP and updated for wall squat and standing IT band stretch. discussed techniques to potentially reduce stiffness.    Person(s) Educated Patient    Methods Explanation;Verbal cues;Handout    Comprehension Verbalized understanding;Verbal cues required            PT Short Term Goals - 11/26/19 1110      PT SHORT TERM GOAL #1   Title Pt will be I with initial HEP for knee/hip/core strength    Baseline doing some of them     Period Weeks    Status Partially Met      PT SHORT TERM GOAL #2   Title Pt will complete various balance and functional screen and set goals based on results.    Status Unable to assess             PT Long Term Goals - 11/26/19 1111      PT LONG TERM GOAL #1   Title Pt will be able to do >.5  mile walk without increased knee/hip pain due to improved strength and gait techniques    Period Weeks    Status Partially Met      PT LONG TERM GOAL #2   Title ind with advanced HEP    Status Partially Met      PT LONG TERM GOAL #3   Title Pt will be able to scoot laterally in bench at restaurant in either direction    Baseline pt notes depends on how long she has been sitting    Period Weeks    Status Partially Met      PT LONG TERM GOAL #4   Title 5 x sit to stand < or = to 12 sec in order to place patient within age related norms.    Period Weeks    Status Achieved      PT LONG TERM GOAL #5   Title pt will be able to walk up and down 4 steps reciprocally without increased pain due to improved LE strength    Baseline if able to maintian momemtum she is able to, but if she stops she ahs to go step to    Period Weeks    Status Partially Met                 Plan - 11/26/19 1148    Clinical Impression Statement Mrs Hughlett continues to report intermittent L knee pain and referred pain that travels up to the lateral aspect of the hip which occurs with no specific mechanism. as well as difficulty with standing after being seated for prolonged period of time. She was able to perform scooting lateraly on tx table, and sit to stand from the table without difficulty which  she reports the issue occurs after sitting for an hour which is difficult to reproduce in treatment. she did wall squats fatiguing quickly which was provided as HEP today. She met or partially met all goals today. Foto score decreased to 50% limited and based on pt request and continued fluctuating sytmpoms will be  discharged from PT today.    PT Treatment/Interventions ADLs/Self Care Home Management;Cryotherapy;Moist Heat;Iontophoresis 34m/ml Dexamethasone;Gait training;Therapeutic activities;Functional mobility training;Stair training;Therapeutic exercise;Balance training;Neuromuscular re-education;Manual techniques;Patient/family education;Passive range of motion;Taping    PT Next Visit Plan D/C today    PT Home Exercise Plan SLR, bridge, quad set and hamstring stretch, calf stretch wall, sit to stand with band around knees, hamstring curl (seated), supine and seated nerve glides, wall squat, IT band stretch.    Consulted and Agree with Plan of Care Patient           Patient will benefit from skilled therapeutic intervention in order to improve the following deficits and impairments:  Decreased activity tolerance, Decreased endurance, Decreased range of motion, Decreased strength, Hypomobility, Increased fascial restricitons, Pain, Impaired UE functional use, Difficulty walking, Decreased mobility, Decreased coordination, Decreased balance, Impaired tone  Visit Diagnosis: Chronic pain of both knees  Other lack of coordination  Difficulty in walking, not elsewhere classified  Stiffness of left knee, not elsewhere classified  Stiffness of right knee, not elsewhere classified     Problem List Patient Active Problem List   Diagnosis Date Noted  . Primary osteoarthritis of left knee 09/02/2019  . Prolapsed internal hemorrhoids, grade 2 03/03/2019  . Brain lesion 06/24/2017  . Osteoarthritis   . Multiple lesions on CT of brain and spine   . Movement disorder   . Lumbar spondylosis   . Hyperlipidemia   . DDD (degenerative disc disease), lumbar   . Colitis   . Dermatitis   . Cervical spondylosis   . Cervical disc disease   . Diastolic dysfunction 040/98/1191 . Syncope 04/18/2016  . Anxiety 04/18/2016  . Hypertension   . Autoimmune disorder-autoimmune encephalitis   . Dystonia  03/16/2015  . Coronary artery calcification of native artery 11/04/2014  . Abnormal finding on MRI of brain 10/04/2014  . Pruritus 11/30/2011  . 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    LStarr Lake8/08/2019, 12:00 PM  [facility] [address]Phone: [phone]  Fax:  [phone] Name: NDEARIA WILMOUTHMRN: [id number] of Birth: 6Dec 18, 1942     PHYSICAL THERAPY DISCHARGE SUMMARY  Visits from Start of Care: 9  Current functional level related to goals / functional outcomes: See goals, FOTO 50% limited   Remaining deficits: See assessment above   Education / Equipment: HEP, posture,   Plan: Patient agrees to discharge.  Patient goals were partially met. Patient is being discharged due to the patient's request.  ?????         Velda Wendt PT, DPT, LAT, ATC  11/26/19  12:01 PM

## 2019-11-30 ENCOUNTER — Encounter: Payer: Self-pay | Admitting: Orthopaedic Surgery

## 2019-11-30 ENCOUNTER — Ambulatory Visit: Payer: Medicare PPO | Admitting: Orthopaedic Surgery

## 2019-11-30 DIAGNOSIS — R269 Unspecified abnormalities of gait and mobility: Secondary | ICD-10-CM

## 2019-11-30 DIAGNOSIS — M1711 Unilateral primary osteoarthritis, right knee: Secondary | ICD-10-CM

## 2019-11-30 DIAGNOSIS — M1712 Unilateral primary osteoarthritis, left knee: Secondary | ICD-10-CM

## 2019-11-30 DIAGNOSIS — M170 Bilateral primary osteoarthritis of knee: Secondary | ICD-10-CM

## 2019-11-30 NOTE — Progress Notes (Signed)
The patient is still dealing with considerable left knee pain but also problems with her gait in general.  She has seen spine specialist and neurologist in the past.  She actually saw her neurologist in [place] last week.  She was unable to do well with attempted nerve conduction studies of her bilateral extremities but she says it was more of the practitioner that was causing her issues.  I did let her know that we have Dr. Ernestina Patches here at the office.  She is familiar with Dr. Ernestina Patches.  Since her neurologist would still like nerve conduction studies due to her bilateral lower extremity pain with numbness and tingling and gait abnormality, it is worth obtaining these bilateral lower extremity nerve conduction studies through Dr. Ernestina Patches to see if there is anything that shows light on her pain and issues she is having.  Again she has seen Dr. Vertell Limber before for her cervical and thoracic spine here in [facility] and does see a neurologist in [place].  Of note, she has been following me for arthritis in her knees.  We last placed a steroid injection in her left knee 3 months ago.  About 6 weeks ago we placed Monovisc in her left knee and she said that did not help.  She is requesting a steroid injection in her left knee today.  I did place a steroid injection in her left knee today without difficulty.  We will send her to Dr. Ernestina Patches for bilateral lower extremity nerve conduction studies and then he can get her back to [place] or her neurologist after that.  All question concerns were answered and addressed.

## 2019-12-01 ENCOUNTER — Ambulatory Visit: Payer: Medicare PPO | Admitting: Physical Therapy

## 2019-12-03 ENCOUNTER — Ambulatory Visit: Payer: Medicare PPO | Admitting: Physical Therapy

## 2019-12-08 ENCOUNTER — Encounter: Payer: Medicare PPO | Admitting: Physical Therapy

## 2019-12-10 ENCOUNTER — Encounter: Payer: Medicare PPO | Admitting: Physical Therapy

## 2019-12-10 DIAGNOSIS — Z1231 Encounter for screening mammogram for malignant neoplasm of breast: Secondary | ICD-10-CM | POA: Diagnosis not present

## 2019-12-16 DIAGNOSIS — Z6835 Body mass index (BMI) 35.0-35.9, adult: Secondary | ICD-10-CM | POA: Diagnosis not present

## 2019-12-16 DIAGNOSIS — G0490 Encephalitis and encephalomyelitis, unspecified: Secondary | ICD-10-CM | POA: Diagnosis not present

## 2019-12-16 DIAGNOSIS — Z79899 Other long term (current) drug therapy: Secondary | ICD-10-CM | POA: Diagnosis not present

## 2019-12-16 DIAGNOSIS — M150 Primary generalized (osteo)arthritis: Secondary | ICD-10-CM | POA: Diagnosis not present

## 2019-12-16 DIAGNOSIS — L932 Other local lupus erythematosus: Secondary | ICD-10-CM | POA: Diagnosis not present

## 2019-12-16 DIAGNOSIS — E669 Obesity, unspecified: Secondary | ICD-10-CM | POA: Diagnosis not present

## 2019-12-16 DIAGNOSIS — M545 Low back pain: Secondary | ICD-10-CM | POA: Diagnosis not present

## 2019-12-17 DIAGNOSIS — R928 Other abnormal and inconclusive findings on diagnostic imaging of breast: Secondary | ICD-10-CM | POA: Diagnosis not present

## 2019-12-27 ENCOUNTER — Other Ambulatory Visit: Payer: Self-pay | Admitting: Cardiology

## 2019-12-30 DIAGNOSIS — N952 Postmenopausal atrophic vaginitis: Secondary | ICD-10-CM | POA: Diagnosis not present

## 2019-12-30 DIAGNOSIS — B373 Candidiasis of vulva and vagina: Secondary | ICD-10-CM | POA: Diagnosis not present

## 2020-01-11 DIAGNOSIS — B373 Candidiasis of vulva and vagina: Secondary | ICD-10-CM | POA: Diagnosis not present

## 2020-01-11 DIAGNOSIS — N951 Menopausal and female climacteric states: Secondary | ICD-10-CM | POA: Diagnosis not present

## 2020-01-25 ENCOUNTER — Other Ambulatory Visit: Payer: Self-pay | Admitting: Cardiology

## 2020-02-02 DIAGNOSIS — B373 Candidiasis of vulva and vagina: Secondary | ICD-10-CM | POA: Diagnosis not present

## 2020-02-02 DIAGNOSIS — N898 Other specified noninflammatory disorders of vagina: Secondary | ICD-10-CM | POA: Diagnosis not present

## 2020-02-22 ENCOUNTER — Ambulatory Visit: Payer: Medicare PPO | Admitting: Orthopaedic Surgery

## 2020-02-22 ENCOUNTER — Encounter: Payer: Self-pay | Admitting: Orthopaedic Surgery

## 2020-02-22 DIAGNOSIS — G8929 Other chronic pain: Secondary | ICD-10-CM | POA: Diagnosis not present

## 2020-02-22 DIAGNOSIS — M1711 Unilateral primary osteoarthritis, right knee: Secondary | ICD-10-CM | POA: Diagnosis not present

## 2020-02-22 DIAGNOSIS — M25562 Pain in left knee: Secondary | ICD-10-CM

## 2020-02-22 DIAGNOSIS — M1712 Unilateral primary osteoarthritis, left knee: Secondary | ICD-10-CM | POA: Diagnosis not present

## 2020-02-22 DIAGNOSIS — M25561 Pain in right knee: Secondary | ICD-10-CM

## 2020-02-22 MED ORDER — LIDOCAINE HCL 1 % IJ SOLN
3.0000 mL | INTRAMUSCULAR | Status: AC | PRN
  Administered 2020-02-22: 3 mL

## 2020-02-22 MED ORDER — METHYLPREDNISOLONE ACETATE 40 MG/ML IJ SUSP
40.0000 mg | INTRAMUSCULAR | Status: AC | PRN
  Administered 2020-02-22: 40 mg via INTRA_ARTICULAR

## 2020-02-22 NOTE — Progress Notes (Signed)
Office Visit Note   Patient: Lindsay Clark           Date of Birth: 01-29-1941           MRN: [id number] Visit Date: 02/22/2020              Requested by: [name], MD [address] [place],  [place] [address] PCP: [name], MD   Assessment & Plan: Visit Diagnoses:  1. Unilateral primary osteoarthritis, left knee   2. Unilateral primary osteoarthritis, right knee   3. Chronic pain of left knee   4. Chronic pain of right knee     Plan: I aspirated about 25 cc of yellow clear fluid from her left knee and only by 10 cc from the right knee.  I placed a steroid in both knees without difficulty.  She will continue to work on quad strengthening exercises.  All questions and concerns were answered and addressed.  She knows that we can always do this again in 3 to 4 months if needed.  Follow-Up Instructions: Return if symptoms worsen or fail to improve.   Orders:  No orders of the defined types were placed in this encounter.  No orders of the defined types were placed in this encounter.     Procedures: Large Joint Inj: R knee on 02/22/2020 11:30 AM Indications: diagnostic evaluation and pain Details: 22 G 1.5 in needle, superolateral approach  Arthrogram: No  Medications: 3 mL lidocaine 1 %; 40 mg methylPREDNISolone acetate 40 MG/ML Outcome: tolerated well, no immediate complications Procedure, treatment alternatives, risks and benefits explained, specific risks discussed. Consent was given by the patient. Immediately prior to procedure a time out was called to verify the correct patient, procedure, equipment, support staff and site/side marked as required. Patient was prepped and draped in the usual sterile fashion.   Large Joint Inj: L knee on 02/22/2020 11:30 AM Indications: diagnostic evaluation and pain Details: 22 G 1.5 in needle, superolateral approach  Arthrogram: No  Medications: 3 mL lidocaine 1 %; 40 mg methylPREDNISolone acetate 40  MG/ML Outcome: tolerated well, no immediate complications Procedure, treatment alternatives, risks and benefits explained, specific risks discussed. Consent was given by the patient. Immediately prior to procedure a time out was called to verify the correct patient, procedure, equipment, support staff and site/side marked as required. Patient was prepped and draped in the usual sterile fashion.       Clinical Data: No additional findings.   Subjective: Chief Complaint  Patient presents with  . Left Knee - Pain  The patient is well-known to [place].  She has chronic bilateral knee pain with pretty left usually worse than the right.  She is not interested in any type of surgical intervention.  She would like to have both knees aspirated and a steroid in both knees.  She has had no other acute change in her medical status.  She is not a diabetic.  HPI  Review of Systems There is currently listed no headache, chest pain, shortness of breath, fever, chills, nausea, vomiting  Objective: Vital Signs: There were no vitals taken for this visit.  Physical Exam She is alert and orient x3 and in no acute distress Ortho Exam Examination of both knees she does show valgus malalignment but otherwise no acute findings. Specialty Comments:  No specialty comments available.  Imaging: No results found.   PMFS History: Patient Active Problem List   Diagnosis Date Noted  . Primary osteoarthritis of left  knee 09/02/2019  . Prolapsed internal hemorrhoids, grade 2 03/03/2019  . Brain lesion 06/24/2017  . Osteoarthritis   . Multiple lesions on CT of brain and spine   . Movement disorder   . Lumbar spondylosis   . Hyperlipidemia   . DDD (degenerative disc disease), lumbar   . Colitis   . Dermatitis   . Cervical spondylosis   . Cervical disc disease   . Diastolic dysfunction 63/14/9702  . Syncope 04/18/2016  . Anxiety 04/18/2016  . Hypertension   . Autoimmune disorder-autoimmune encephalitis    . Dystonia 03/16/2015  . Coronary artery calcification of native artery 11/04/2014  . Abnormal finding on MRI of brain 10/04/2014  . Pruritus 11/30/2011  . 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 Atherosclerosis    mild cerebral  . Atrial fibrillation and flutter ([place])   . Autoimmune disorder ([place])   . Cervical disc disease    with spondylosis  . Cervical spondylosis   . Chronic dermatitis    eczematous  . Colitis   . DDD (degenerative disc disease), lumbar   . Frozen shoulder    left  . Hyperlipidemia   . Hypertension   . Lateral epicondylitis   . Lumbar spondylosis   . Movement disorder   . Multiple lesions on CT of brain and spine    reports lesions from previous CT [facility] at [facility]   . 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    Family History  Problem Relation Age of Onset  . Stroke Mother   . Colon cancer Mother 103  . Heart attack Father 55  . Emphysema Father   . Thyroid disease Son   . Thyroid disease Maternal Grandmother   . Cancer Maternal Grandmother        cervical   . Thrombosis Maternal Grandfather     Past Surgical History:  Procedure Laterality Date  . CATARACT EXTRACTION    . COLONOSCOPY    . EYE SURGERY Left   . HEMORRHOID BANDING    . LOOP RECORDER INSERTION N/A 07/05/2017   Procedure: LOOP RECORDER INSERTION;  Surgeon: Thompson Grayer, MD;  Location: [facility];  Service: Cardiovascular;  Laterality: N/A;  . TUBAL LIGATION  1980   Social History   Occupational History  . Occupation: Retired    Fish farm manager: OTHER  Tobacco Use  . Smoking status: Never Smoker  . Smokeless tobacco: Never Used  Vaping Use  . Vaping Use: Never used  Substance and Sexual Activity  . Alcohol use: Yes    Alcohol/week: 0.0 standard drinks    Comment: 4 glasses of wine weekly  . Drug use: No  . Sexual activity: Not Currently

## 2020-03-07 DIAGNOSIS — L820 Inflamed seborrheic keratosis: Secondary | ICD-10-CM | POA: Diagnosis not present

## 2020-03-07 DIAGNOSIS — L304 Erythema intertrigo: Secondary | ICD-10-CM | POA: Diagnosis not present

## 2020-03-22 NOTE — Progress Notes (Signed)
HPI: FUPAF. 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 Patient seen in September 2017with complaints of palpitations and near syncope. Patient was scheduled for an exercise treadmill and when she came she was noted to have runs of paroxysmal atrial tachycardia/fibrillation. Procedure was canceled.Monitor October 2018 showed paroxysmal atririllation and flutter. Nuclear study October 2018 showed ejection fraction 67%, apical thinning but no ischemia.Placed on apixaban.Had implantable loop recorder placed by Dr. Rayann Heman for syncope and recurrent palpitations. She has been found to have "heart pounding" episodes not related to arrhythmia. Echo repeated 2/21 showed normal LV function, grade 1 DD, mild to moderate LAE. Coronary CTA 2/21 showed Ca score 36 and minimal (0-24%) in proximal LM, proximal and mid LAD and proximal Lcx. Since last seen, she does note increased dyspnea on exertion.  No orthopnea, PND, pedal edema, chest pain, palpitations or syncope.  She also notes that her heart rate has been elevated on her watch and can be 130 at times.  Current Outpatient Medications  Medication Sig Dispense Refill  . diltiazem (CARDIZEM CD) 240 MG 24 hr capsule TAKE 1 CAPSULE EVERY DAY. 30 capsule 9  . ELIQUIS 5 MG TABS tablet TAKE 1 TABLET BY MOUTH TWICE DAILY. 180 tablet 1  . furosemide (LASIX) 40 MG tablet TAKE 1 TABLET EACH DAY. 90 tablet 0  . hydroxychloroquine (PLAQUENIL) 200 MG tablet Take 200 mg by mouth 2 (two) times daily.    . hydrOXYzine (ATARAX/VISTARIL) 10 MG tablet Take 10 mg by mouth 3 (three) times daily as needed.    Marland Kitchen ketoconazole (NIZORAL) 2 % shampoo Apply topically 3 (three) times a week.    . metoprolol tartrate (LOPRESSOR) 50 MG tablet TAKE 2 HOURS PRIOR TO CT SCAN 1 tablet 0  . Multiple Vitamins-Minerals (ONE-A-DAY WOMENS 50 PLUS PO) Take 1 tablet by mouth daily.     Marland Kitchen nystatin cream (MYCOSTATIN) 2 (two) times a week.    . Potassium Chloride ER 20 MEQ  TBCR Take 2 tablets twice daily x 2 days then decrease to 2 tablets daily (Patient taking differently: Take 20 mEq by mouth daily. Take 2 tablets twice daily x 2 days then decrease to 2 tablets daily) 68 tablet 6  . Propylene Glycol 0.6 % SOLN Place 1 drop into both eyes at bedtime.     No current facility-administered medications for this visit.     Past Medical History:  Diagnosis Date  . Atherosclerosis    mild cerebral  . Atrial fibrillation and flutter ([place])   . Autoimmune disorder ([place])   . Cervical disc disease    with spondylosis  . Cervical spondylosis   . Chronic dermatitis    eczematous  . Colitis   . DDD (degenerative disc disease), lumbar   . Frozen shoulder    left  . Hyperlipidemia   . Hypertension   . Lateral epicondylitis   . Lumbar spondylosis   . Movement disorder   . Multiple lesions on CT of brain and spine    reports lesions from [facility] at [facility]   . 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 CATARACT EXTRACTION    . COLONOSCOPY    . EYE SURGERY Left   . HEMORRHOID BANDING    . LOOP RECORDER INSERTION N/A 07/05/2017   Procedure: LOOP RECORDER INSERTION;  Surgeon: Thompson Grayer, MD;  Location: [facility];  Service: Cardiovascular;  Laterality: N/A;  .  TUBAL LIGATION  1980    Social History   Socioeconomic History  . Marital status: Married    Spouse name: [name]  . Number of children: 2  . Years of education: MAx2  . Highest education level: Not on file  Occupational History  . Occupation: Retired    Fish farm manager: OTHER  Tobacco Use  . Smoking status: Never Smoker  . Smokeless tobacco: Never Used  Vaping Use  . Vaping Use: Never used  Substance and Sexual Activity  . Alcohol use: Yes    Alcohol/week: 0.0 standard drinks    Comment: 4 glasses of wine weekly  . Drug use: No  . Sexual activity: Not Currently  Other Topics Concern  . Not on file  Social History Narrative    Patient lives at home with her spouse.   Caffeine Use: 2-3 cups daily   Social Determinants of Health   Financial Resource Strain:   . Difficulty of Paying Living Expenses: Not on file  Food Insecurity:   . Worried About Charity fundraiser in the Last Year: Not on file  . Ran Out of Food in the Last Year: Not on file  Transportation Needs:   . Lack of Transportation (Medical): Not on file  . Lack of Transportation (Non-Medical): Not on file  Physical Activity:   . Days of Exercise per Week: Not on file  . Minutes of Exercise per Session: Not on file  Stress:   . Feeling of Stress : Not on file  Social Connections:   . Frequency of Communication with Friends and Family: Not on file  . Frequency of Social Gatherings with Friends and Family: Not on file  . Attends Religious Services: Not on file  . Active Member of Clubs or Organizations: Not on file  . Attends Archivist Meetings: Not on file  . Marital Status: Not on file  Intimate Partner Violence:   . Fear of Current or Ex-Partner: Not on file  . Emotionally Abused: Not on file  . Physically Abused: Not on file  . Sexually Abused: Not on file    Family History  Problem Relation Age of Onset  . Stroke Mother   . Colon cancer Mother 66  . Heart attack Father 25  . Emphysema Father   . Thyroid disease Son   . Thyroid disease Maternal Grandmother   . Cancer Maternal Grandmother        cervical   . Thrombosis Maternal Grandfather     ROS: no fevers or chills, productive cough, hemoptysis, dysphasia, odynophagia, melena, hematochezia, dysuria, hematuria, rash, seizure activity, orthopnea, PND, pedal edema, claudication. Remaining systems are negative.  Physical Exam: Well-developed well-nourished in no acute distress.  Skin is warm and dry.  HEENT is normal.  Neck is supple.  Chest is clear to auscultation with normal expansion.  Cardiovascular exam is irregular and tachycardic Abdominal exam nontender or  distended. No masses palpated. Extremities show no edema. neuro grossly intact  ECG-atrial fibrillation at a rate of 104, no ST changes.  Personally reviewed  A/P  1 paroxysmal atrial fibrillation-patient is back in atrial fibrillation.  I think she is likely symptomatic as she describes increased dyspnea on exertion compared to previous.  This certainly could be from her atrial fibrillation and therefore rhythm control likely best option.  Her heart rate is minimally elevated today.  Increase Cardizem to 360 mg daily.  Continue apixaban (she states she missed a dose approximately 1-1/2 weeks ago).  I will  refer to atrial fibrillation clinic for suggestions concerning antiarrhythmic.  She has minimal coronary artery disease and therefore flecainide likely not best choice.  Tikosyn may be an option.  We will then plan cardioversion.  Also note her loop monitor has been reactivated.  Check CBC and BMET.  2 hypertension-patient's blood pressure is controlled today.  Continue present medical regimen and follow.  3 history of syncope-She has had no recurrences.  4 coronary atherosclerosis-no aspirin given need for apixaban.  She declines lipid-lowering medications.  5 pedal edema-continue diuretic at present dose. Check BMET.  6 history of dyspnea-previous echocardiogram showed normal LV function and CTA showed no obstructive coronary disease.  Kirk Ruths, MD

## 2020-03-23 ENCOUNTER — Telehealth: Payer: Self-pay | Admitting: Internal Medicine

## 2020-03-23 NOTE — Telephone Encounter (Signed)
Patient called and requested to begin remote monitoring of her loop recorder at this time. Patient reports  concerns about cost of previous monthly reports. Informed patient  that price of monthly remotes is determined by [facility] [employer]. Patient requested monitoring be resumed .Patient instructed to plug in remote monitor and contact the device clinic direct # for assistance in sending remote transmission to acquire data stored and to ensure remote monitor operational. Patient agreed to call office in the morning for assistance with remote monitoring.

## 2020-03-23 NOTE — Telephone Encounter (Signed)
Requesting a call to discuss an implant that she had deactivated and may possibly want to reactivate it. She requested a call from Dr. Rayann Heman or his nurse.

## 2020-03-24 ENCOUNTER — Encounter: Payer: Self-pay | Admitting: Cardiology

## 2020-03-24 ENCOUNTER — Ambulatory Visit: Payer: Medicare PPO | Admitting: Cardiology

## 2020-03-24 ENCOUNTER — Other Ambulatory Visit: Payer: Self-pay

## 2020-03-24 VITALS — BP 112/82 | HR 104 | Ht 64.0 in | Wt 213.6 lb

## 2020-03-24 DIAGNOSIS — I10 Essential (primary) hypertension: Secondary | ICD-10-CM

## 2020-03-24 DIAGNOSIS — R609 Edema, unspecified: Secondary | ICD-10-CM | POA: Diagnosis not present

## 2020-03-24 DIAGNOSIS — R002 Palpitations: Secondary | ICD-10-CM

## 2020-03-24 DIAGNOSIS — I480 Paroxysmal atrial fibrillation: Secondary | ICD-10-CM | POA: Diagnosis not present

## 2020-03-24 MED ORDER — DILTIAZEM HCL ER COATED BEADS 360 MG PO CP24: 360.0000 mg | ORAL_CAPSULE | Freq: Every day | ORAL | 3 refills | Status: DC

## 2020-03-24 NOTE — Patient Instructions (Signed)
Medication Instructions:   INCREASE DILTIAZEM TO 360 MG ONCE DAILY  *If you need a refill on your cardiac medications before your next appointment, please call your pharmacy*   Lab Work:  Your physician recommends that you HAVE LAB WORK TODAY  If you have labs (blood work) drawn today and your tests are completely normal, you will receive your results only by: Marland Kitchen MyChart Message (if you have MyChart) OR . A paper copy in the mail If you have any lab test that is abnormal or we need to change your treatment, we will call you to review the results.  Follow-Up: At [facility], you and your health needs are our priority.  As part of our continuing mission to provide you with exceptional heart care, we have created designated Provider Care Teams.  These Care Teams include your primary Cardiologist (physician) and Advanced Practice Providers (APPs -  Physician Assistants and Nurse Practitioners) who all work together to provide you with the care you need, when you need it.  We recommend signing up for the patient portal called "MyChart".  Sign up information is provided on this After Visit Summary.  MyChart is used to connect with patients for Virtual Visits (Telemedicine).  Patients are able to view lab/test results, encounter notes, upcoming appointments, etc.  Non-urgent messages can be sent to your provider as well.   To learn more about what you can do with MyChart, go to [email or web address].    Your next appointment:   3 month(s)  The format for your next appointment:   In Person  Provider:   Kirk Ruths, MD   Other Instructions  REFERRAL TO [place]

## 2020-03-25 LAB — CBC
Hematocrit: 42.8 % (ref 34.0–46.6)
Hemoglobin: 14.7 g/dL (ref 11.1–15.9)
MCH: 31.1 pg (ref 26.6–33.0)
MCHC: 34.3 g/dL (ref 31.5–35.7)
MCV: 91 fL (ref 79–97)
Platelets: 217 10*3/uL (ref 150–450)
RBC: 4.72 x10E6/uL (ref 3.77–5.28)
RDW: 12.6 % (ref 11.7–15.4)
WBC: 5.6 10*3/uL (ref 3.4–10.8)

## 2020-03-25 LAB — BASIC METABOLIC PANEL
BUN/Creatinine Ratio: 26 (ref 12–28)
BUN: 17 mg/dL (ref 8–27)
CO2: 26 mmol/L (ref 20–29)
Calcium: 8.9 mg/dL (ref 8.7–10.3)
Chloride: 104 mmol/L (ref 96–106)
Creatinine, Ser: 0.66 mg/dL (ref 0.57–1.00)
GFR calc Af Amer: 99 mL/min/{1.73_m2} (ref >59)
GFR calc non Af Amer: 85 mL/min/{1.73_m2} (ref >59)
Glucose: 114 mg/dL — ABNORMAL HIGH (ref 65–99)
Potassium: 4.2 mmol/L (ref 3.5–5.2)
Sodium: 144 mmol/L (ref 134–144)

## 2020-03-29 ENCOUNTER — Telehealth: Payer: Self-pay

## 2020-03-29 DIAGNOSIS — I4819 Other persistent atrial fibrillation: Secondary | ICD-10-CM | POA: Diagnosis not present

## 2020-03-29 DIAGNOSIS — B372 Candidiasis of skin and nail: Secondary | ICD-10-CM | POA: Diagnosis not present

## 2020-03-29 DIAGNOSIS — R151 Fecal smearing: Secondary | ICD-10-CM | POA: Diagnosis not present

## 2020-03-29 DIAGNOSIS — R110 Nausea: Secondary | ICD-10-CM | POA: Diagnosis not present

## 2020-03-29 NOTE — Telephone Encounter (Signed)
ILR alert received for an event report, no data available. Need patient to send manual transmission.  Patient is not home, husband will have patient call. Phone number provided.

## 2020-03-29 NOTE — Telephone Encounter (Signed)
Transmission received. Advised patient transmission was received. Presenting VS 75 (irregular). AT/AF burden 17.8%. Patient has follow up apt. With AF clinic 04/05/20.

## 2020-04-04 ENCOUNTER — Ambulatory Visit (HOSPITAL_COMMUNITY): Payer: Medicare PPO | Admitting: Physician Assistant

## 2020-04-05 ENCOUNTER — Other Ambulatory Visit: Payer: Self-pay

## 2020-04-05 ENCOUNTER — Ambulatory Visit (HOSPITAL_COMMUNITY)
Admission: RE | Admit: 2020-04-05 | Discharge: 2020-04-05 | Disposition: A | Discharge status: Home / Self Care | Payer: Medicare PPO | Source: Ambulatory Visit | Attending: Physician Assistant | Admitting: Physician Assistant

## 2020-04-05 ENCOUNTER — Encounter (HOSPITAL_COMMUNITY): Payer: Self-pay | Admitting: Physician Assistant

## 2020-04-05 VITALS — BP 132/74 | HR 70 | Ht 64.0 in | Wt 213.2 lb

## 2020-04-05 DIAGNOSIS — D6869 Other thrombophilia: Secondary | ICD-10-CM | POA: Diagnosis not present

## 2020-04-05 DIAGNOSIS — Z8249 Family history of ischemic heart disease and other diseases of the circulatory system: Secondary | ICD-10-CM | POA: Diagnosis not present

## 2020-04-05 DIAGNOSIS — Z6836 Body mass index (BMI) 36.0-36.9, adult: Secondary | ICD-10-CM | POA: Diagnosis not present

## 2020-04-05 DIAGNOSIS — I4819 Other persistent atrial fibrillation: Secondary | ICD-10-CM | POA: Diagnosis not present

## 2020-04-05 DIAGNOSIS — Z888 Allergy status to other drugs, medicaments and biological substances status: Secondary | ICD-10-CM | POA: Diagnosis not present

## 2020-04-05 DIAGNOSIS — Z79899 Other long term (current) drug therapy: Secondary | ICD-10-CM | POA: Insufficient documentation

## 2020-04-05 DIAGNOSIS — Z7901 Long term (current) use of anticoagulants: Secondary | ICD-10-CM | POA: Insufficient documentation

## 2020-04-05 DIAGNOSIS — E669 Obesity, unspecified: Secondary | ICD-10-CM | POA: Diagnosis not present

## 2020-04-05 DIAGNOSIS — I2510 Atherosclerotic heart disease of native coronary artery without angina pectoris: Secondary | ICD-10-CM | POA: Insufficient documentation

## 2020-04-05 DIAGNOSIS — E785 Hyperlipidemia, unspecified: Secondary | ICD-10-CM | POA: Diagnosis not present

## 2020-04-05 DIAGNOSIS — I10 Essential (primary) hypertension: Secondary | ICD-10-CM | POA: Diagnosis not present

## 2020-04-05 DIAGNOSIS — Z823 Family history of stroke: Secondary | ICD-10-CM | POA: Diagnosis not present

## 2020-04-05 NOTE — Progress Notes (Signed)
Primary Care Physician: [name], MD Primary Cardiologist: Dr [name] Primary Electrophysiologist: Dr Rayann Heman Referring Physician: Dr Jennette Kettle Lindsay Clark is a 79 y.o. female with a history of HLD, HTN, CAD, and atrial fibrillation who presents for follow up in the [facility]. Seen by Roderic Palau remotely. The patient was initially diagnosed with atrial fibrillation 2018 after presenting with symptoms of palpitations and near syncope. ILR was placed in 2019. Patient declined remote monitoring. Patient is on Eliquis for a CHADS2VASC score of 5. She was seen by Dr [name] on 03/24/20 and found to be back in afib. Her ILR was interrogated and she had been in persistent afib since at least September. Her diltiazem was increased at that time. Since then, she has remained in afib with overall controlled V rates. He does have some symptoms of fatigue and dyspnea with exertion. Of note, she is currently caring for her ill husband who is also a pt of Dr [name].   Today, she denies symptoms of palpitations, chest pain, orthopnea, PND, lower extremity edema, dizziness, presyncope, syncope, snoring, daytime somnolence, bleeding, or neurologic sequela. The patient is tolerating medications without difficulties and is otherwise without complaint today.    Atrial Fibrillation Risk Factors:  she does not have symptoms or diagnosis of sleep apnea. she does not have a history of rheumatic fever.   she has a BMI of Body mass index is 36.6 kg/m.Marland Kitchen Filed Weights   04/05/20 1416  Weight: 96.7 kg    Family History  Problem Relation Age of Onset  . Stroke Mother   . Colon cancer Mother 49  . Heart attack Father 85  . Emphysema Father   . Thyroid disease Son   . Thyroid disease Maternal Grandmother   . Cancer Maternal Grandmother        cervical   . Thrombosis Maternal Grandfather      Atrial Fibrillation Management history:  Previous antiarrhythmic  drugs: none Previous cardioversions: none Previous ablations: none CHADS2VASC score: 5 Anticoagulation history: Eliquis   Past Medical History:  Diagnosis Date  . Atherosclerosis    mild cerebral  . Atrial fibrillation and flutter ([place])   . 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 Cervical disc disease    with spondylosis  . Cervical spondylosis   . Chronic dermatitis    eczematous  . Colitis   . DDD (degenerative disc disease), lumbar   . Frozen shoulder    left  . Hyperlipidemia   . Hypertension   . Lateral epicondylitis   . Lumbar spondylosis   . Movement disorder   . Multiple lesions on CT of brain and spine    reports lesions from [facility] CT [facility] at [facility]   . 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 CATARACT EXTRACTION    . COLONOSCOPY    . EYE SURGERY Left   . HEMORRHOID BANDING    . LOOP RECORDER INSERTION N/A 07/05/2017   Procedure: LOOP RECORDER INSERTION;  Surgeon: Thompson Grayer, MD;  Location: [facility];  Service: Cardiovascular;  Laterality: N/A;  . TUBAL LIGATION  1980    Current Outpatient Medications  Medication Sig Dispense Refill  . clobetasol ointment (TEMOVATE) 0.96 % 1 application    . diltiazem (CARDIZEM CD) 360 MG 24 hr capsule Take 1 capsule (360 mg total) by mouth daily. 90 capsule 3  . ELIQUIS 5 MG TABS tablet TAKE 1  TABLET BY MOUTH TWICE DAILY. 180 tablet 1  . furosemide (LASIX) 40 MG tablet TAKE 1 TABLET EACH DAY. 90 tablet 0  . hydroxychloroquine (PLAQUENIL) 200 MG tablet Take 200 mg by mouth 2 (two) times daily.    . hydrOXYzine (ATARAX/VISTARIL) 10 MG tablet Take 10 mg by mouth 3 (three) times daily as needed.    Marland Kitchen ketoconazole (NIZORAL) 2 % shampoo Apply topically 3 (three) times a week.    . Multiple Vitamins-Minerals (ONE-A-DAY WOMENS 50 PLUS PO) Take 1 tablet by mouth daily.     . Potassium Chloride ER 20 MEQ TBCR Take 2 tablets twice daily x 2 days then decrease to 2 tablets daily 68  tablet 6  . Propylene Glycol 0.6 % SOLN Place 1 drop into both eyes at bedtime.     No current facility-administered medications for this encounter.    Allergies  Allergen Reactions  . Methotrexate     Other reaction(s): swelling in ankles  . Metoprolol Succinate [Metoprolol]     Other reaction(s): swelling in ankles  . Metronidazole     Other reaction(s): leg swelling  . Other Other (See Comments)    Medication:Methychloroisothlazolinone/methylisothiazilinoneCL +Me-Isothiazolinone Reaction: unknown-patch testing Medication:glyceryl thioglycolate  Reaction:unknown-patch testing  . Quaternium-15 Other (See Comments)    unknown reaction-patching testing    Social History   Socioeconomic History  . Marital status: Married    Spouse name: [name]  . Number of children: 2  . Years of education: MAx2  . Highest education level: Not on file  Occupational History  . Occupation: Retired    Fish farm manager: OTHER  Tobacco Use  . Smoking status: Never Smoker  . Smokeless tobacco: Never Used  Vaping Use  . Vaping Use: Never used  Substance and Sexual Activity  . Alcohol use: Yes    Alcohol/week: 4.0 standard drinks    Types: 4 Glasses of wine per week    Comment: 4 glasses of wine weekly  . Drug use: No  . Sexual activity: Not Currently  Other Topics Concern  . Not on file  Social History Narrative   Patient lives at home with her spouse.   Caffeine Use: 2-3 cups daily   Social Determinants of Health   Financial Resource Strain: Not on file  Food Insecurity: Not on file  Transportation Needs: Not on file  Physical Activity: Not on file  Stress: Not on file  Social Connections: Not on file  Intimate Partner Violence: Not on file     ROS- All systems are reviewed and negative except as per the HPI above.  Physical Exam: Vitals:   04/05/20 1416  BP: 132/74  Pulse: 70  Weight: 96.7 kg  Height: 5\' 4"  (1.626 m)    GEN- The patient is well appearing obese elderly  female, alert and oriented x 3 today.   Head- normocephalic, atraumatic Eyes-  Sclera clear, conjunctiva pink Ears- hearing intact Oropharynx- clear Neck- supple  Lungs- Clear to ausculation bilaterally, normal work of breathing Heart- irregular rate and rhythm, no murmurs, rubs or gallops  GI- soft, NT, ND, + BS Extremities- no clubbing, cyanosis, or edema MS- no significant deformity or atrophy Skin- no rash or lesion Psych- euthymic mood, full affect Neuro- strength and sensation are intact  Wt Readings from Last 3 Encounters:  04/05/20 96.7 kg  03/24/20 96.9 kg  10/27/19 95.4 kg    EKG today demonstrates afib HR 70, QRS 90, QTc 473  Echo 05/25/19 demonstrated  1. Left ventricular ejection fraction, by  visual estimation, is 60 to  65%. The left ventricle has normal function. There is no left ventricular  hypertrophy.  2. The left ventricle has no regional wall motion abnormalities.  3. Left ventricular diastolic parameters are consistent with Grade I  diastolic dysfunction (impaired relaxation).  4. Elevated left ventricular end-diastolic pressure.  5. Global right ventricle has normal systolic function.The right  ventricular size is normal. No increase in right ventricular wall  thickness.  6. Left atrial size was mild-moderately dilated.  7. Right atrial size was normal.  8. The mitral valve is normal in structure. No evidence of mitral valve  regurgitation. No evidence of mitral stenosis.  9. The tricuspid valve is normal in structure. Tricuspid valve  regurgitation is trivial.  10. The aortic valve is tricuspid. Aortic valve regurgitation is not  visualized. Mild aortic valve sclerosis without stenosis.  11. The pulmonic valve was normal in structure. Pulmonic valve  regurgitation is not visualized.  12. 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  13. Normal pulmonary artery systolic pressure.  14. The inferior vena cava is normal in  size with greater than 50%  respiratory variability, suggesting right atrial pressure of 3 mmHg.   Epic records are reviewed at length today  CHA2DS2-VASc Score = 5  The patient's score is based upon: CHF History: No HTN History: Yes Diabetes History: No Stroke History: No Vascular Disease History: Yes Age Score: 2 Gender Score: 1      ASSESSMENT AND PLAN: 1. Persistent Atrial Fibrillation (ICD10:  I48.19) The patient's CHA2DS2-VASc score is 5, indicating a 7.2% annual risk of stroke.   General education about afib provided and questions answered. We also discussed her stroke risk and the risks and benefits of anticoagulation. We discussed therapeutic options today including AAD (dofetilide, amiodarone) and DCCV.   Her QT is 437-456 ms in SR manually calculated. Per pharmacy, hydroxychloroquine is not contraindicated with dofetilide or amio but will need close QT monitoring. For now, patient would like to continue her present therapy until her husband's acute medical issues have resolved. Will plan to regroup in a month to discuss options.  Continue Eliquis 5 mg BID Continue diltiazem 360 mg daily  2. Secondary Hypercoagulable State (ICD10:  D68.69) The patient is at significant risk for stroke/thromboembolism based upon her CHA2DS2-VASc Score of 5.  Continue Apixaban (Eliquis).   3. Obesity Body mass index is 36.6 kg/m. Lifestyle modification was discussed at length including regular exercise and weight reduction.  4. CAD CTA showed no obstructive disease. No anginal symptoms.  5. HTN Stable, no changes today.   Follow up in the AF clinic in one month.    [facility] [address] [place], [place] [address] [phone] 04/05/2020 3:05 PM

## 2020-04-06 ENCOUNTER — Other Ambulatory Visit: Payer: Self-pay | Admitting: Physician Assistant

## 2020-04-06 ENCOUNTER — Telehealth: Payer: Self-pay | Admitting: Orthopaedic Surgery

## 2020-04-06 MED ORDER — GABAPENTIN 300 MG PO CAPS: 300.0000 mg | ORAL_CAPSULE | Freq: Every day | ORAL | 1 refills | Status: DC

## 2020-04-06 NOTE — Telephone Encounter (Signed)
Pt called stating she sent a mychart message and wanted to make sure her request for gabapentin was seen; she states she has pain behind her L kneecap and it is painful for her to stand, walk, and even just to be in bed. Pt would like a CB if Dr.Blackman will be able to call this in. Pt also stated if Dr.Blackman will read the first half of her note, it doesn't require any action to be taken but it will serve as an FYI.   [phone]

## 2020-04-06 NOTE — Telephone Encounter (Signed)
Done   see other message

## 2020-04-06 NOTE — Telephone Encounter (Signed)
I spoke with the pt and she stated has taken it in the past for nerve issues and would like to try it again. She is having tingling and spasms down her leg. She would like this sent to [facility] please.

## 2020-04-06 NOTE — Telephone Encounter (Signed)
Call her 

## 2020-04-21 ENCOUNTER — Other Ambulatory Visit: Payer: Self-pay | Admitting: *Deleted

## 2020-04-21 ENCOUNTER — Telehealth: Payer: Self-pay | Admitting: Cardiology

## 2020-04-21 DIAGNOSIS — R609 Edema, unspecified: Secondary | ICD-10-CM

## 2020-04-21 NOTE — Telephone Encounter (Signed)
Discussed with dr Jens Som and patient will be scheduled for venous dopplers to confirm no DVT in the left leg and also venous reflux dopplers. Order placed and she will be contacted to schedule.

## 2020-04-21 NOTE — Telephone Encounter (Signed)
Spoke with patient regarding Venous doppler appointment scheduled Monday 04/25/20 at 10:00pm here at [facility] and Venous Reflux scheduled Thursday 04/28/20 at 3:00pm here as well.  Patient voiced her understanding.

## 2020-04-25 ENCOUNTER — Ambulatory Visit (HOSPITAL_COMMUNITY)
Admission: RE | Admit: 2020-04-25 | Discharge: 2020-04-25 | Disposition: A | Discharge status: Home / Self Care | Payer: Medicare PPO | Source: Ambulatory Visit | Attending: Cardiology | Admitting: Cardiology

## 2020-04-25 ENCOUNTER — Other Ambulatory Visit: Payer: Self-pay

## 2020-04-25 DIAGNOSIS — R600 Localized edema: Secondary | ICD-10-CM | POA: Diagnosis not present

## 2020-04-25 DIAGNOSIS — R609 Edema, unspecified: Secondary | ICD-10-CM | POA: Diagnosis not present

## 2020-04-26 ENCOUNTER — Ambulatory Visit (INDEPENDENT_AMBULATORY_CARE_PROVIDER_SITE_OTHER): Payer: Medicare PPO

## 2020-04-26 DIAGNOSIS — I4819 Other persistent atrial fibrillation: Secondary | ICD-10-CM

## 2020-04-26 LAB — CUP PACEART REMOTE DEVICE CHECK
Date Time Interrogation Session: 20220104030058
Implantable Pulse Generator Implant Date: 20190315

## 2020-04-27 NOTE — Progress Notes (Signed)
Carelink Summary Report / Loop Recorder 

## 2020-04-28 ENCOUNTER — Other Ambulatory Visit: Payer: Self-pay

## 2020-04-28 ENCOUNTER — Ambulatory Visit: Payer: Medicare PPO | Admitting: Orthopaedic Surgery

## 2020-04-28 ENCOUNTER — Encounter (HOSPITAL_COMMUNITY): Payer: Medicare PPO

## 2020-04-28 ENCOUNTER — Encounter: Payer: Self-pay | Admitting: Orthopaedic Surgery

## 2020-04-28 DIAGNOSIS — M25561 Pain in right knee: Secondary | ICD-10-CM

## 2020-04-28 DIAGNOSIS — M25562 Pain in left knee: Secondary | ICD-10-CM

## 2020-04-28 DIAGNOSIS — G8929 Other chronic pain: Secondary | ICD-10-CM | POA: Diagnosis not present

## 2020-04-28 MED ORDER — METHYLPREDNISOLONE 4 MG PO TABS: ORAL_TABLET | ORAL | 0 refills | Status: DC

## 2020-04-28 MED ORDER — GABAPENTIN 300 MG PO CAPS: 300.0000 mg | ORAL_CAPSULE | Freq: Three times a day (TID) | ORAL | 1 refills | Status: DC

## 2020-04-28 NOTE — Progress Notes (Signed)
The patient is well-known to me.  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  Her left knee is really flared up quite a bit recently.  Both knees have valgus malalignment.  Just a weeks ago I aspirated both knees in place a steroid injection in both knees.  She is not interested in any type of surgical intervention but has had an acute flareup of her pain in the back of her left knee that is causing pain around the IT band going up to the trochanteric area and down her leg.  She reports some numbness and tingling her feet.  At one time we did recommend 300 mg of Neurontin at night but she did not ever start that.  She is not a diabetic.  On exam her left knee does have valgus malalignment and a moderate effusion.  There is significant pain with attempts of range of motion of the knee.  There is also pain in the IT band and trochanteric area.  I would like to put her on a 6-day steroid taper and I talked to her again about Neurontin.  I would like her to start at 300 mg at bedtime for 3 days and then can increase it to twice a day after that.  If she continues to tolerate well she can even go up to 3 times a day.  I will see her back in 4 weeks to consider repeat aspiration and a steroid injection in her left knee and even her right knee if needed.

## 2020-04-30 DIAGNOSIS — Z1152 Encounter for screening for COVID-19: Secondary | ICD-10-CM | POA: Diagnosis not present

## 2020-05-04 ENCOUNTER — Encounter (HOSPITAL_COMMUNITY): Payer: Self-pay | Admitting: Physician Assistant

## 2020-05-04 ENCOUNTER — Ambulatory Visit: Payer: Medicare PPO | Admitting: Physician Assistant

## 2020-05-04 ENCOUNTER — Ambulatory Visit (HOSPITAL_COMMUNITY)
Admission: RE | Admit: 2020-05-04 | Discharge: 2020-05-04 | Disposition: A | Discharge status: Home / Self Care | Payer: Medicare PPO | Source: Ambulatory Visit | Attending: Physician Assistant | Admitting: Physician Assistant

## 2020-05-04 ENCOUNTER — Other Ambulatory Visit: Payer: Self-pay

## 2020-05-04 VITALS — BP 128/68 | HR 90 | Ht 64.0 in | Wt 210.2 lb

## 2020-05-04 DIAGNOSIS — I10 Essential (primary) hypertension: Secondary | ICD-10-CM | POA: Insufficient documentation

## 2020-05-04 DIAGNOSIS — E669 Obesity, unspecified: Secondary | ICD-10-CM | POA: Insufficient documentation

## 2020-05-04 DIAGNOSIS — Z8249 Family history of ischemic heart disease and other diseases of the circulatory system: Secondary | ICD-10-CM | POA: Diagnosis not present

## 2020-05-04 DIAGNOSIS — Z79899 Other long term (current) drug therapy: Secondary | ICD-10-CM | POA: Insufficient documentation

## 2020-05-04 DIAGNOSIS — E785 Hyperlipidemia, unspecified: Secondary | ICD-10-CM | POA: Insufficient documentation

## 2020-05-04 DIAGNOSIS — I4819 Other persistent atrial fibrillation: Secondary | ICD-10-CM | POA: Diagnosis not present

## 2020-05-04 DIAGNOSIS — I2510 Atherosclerotic heart disease of native coronary artery without angina pectoris: Secondary | ICD-10-CM | POA: Diagnosis not present

## 2020-05-04 DIAGNOSIS — Z6836 Body mass index (BMI) 36.0-36.9, adult: Secondary | ICD-10-CM | POA: Insufficient documentation

## 2020-05-04 DIAGNOSIS — Z888 Allergy status to other drugs, medicaments and biological substances status: Secondary | ICD-10-CM | POA: Diagnosis not present

## 2020-05-04 DIAGNOSIS — Z7901 Long term (current) use of anticoagulants: Secondary | ICD-10-CM | POA: Insufficient documentation

## 2020-05-04 DIAGNOSIS — D6869 Other thrombophilia: Secondary | ICD-10-CM

## 2020-05-04 NOTE — Progress Notes (Signed)
Primary Care Physician: [name], MD Primary Cardiologist: Dr [name] Primary Electrophysiologist: Dr Rayann Heman Referring Physician: Dr Jennette Kettle Lindsay Clark is a 80 y.o. female with a history of HLD, HTN, CAD, and atrial fibrillation who presents for follow up in the [facility]. Seen by Roderic Palau remotely. The patient was initially diagnosed with atrial fibrillation 2018 after presenting with symptoms of palpitations and near syncope. ILR was placed in 2019. Patient declined remote monitoring. Patient is on Eliquis for a CHADS2VASC score of 5. She was seen by Dr [name] on 03/24/20 and found to be back in afib. Her ILR was interrogated and she had been in persistent afib since at least September. Her diltiazem was increased at that time. Since then, she has remained in afib with overall controlled V rates. She does have some symptoms of fatigue and dyspnea with exertion. Of note, she is currently caring for her ill husband who is also a pt of Dr [name].   On follow up today, patient reports she is about the same as her last visit. ILR shows 100% afib burden. She denies any bleeding issues on anticoagulation. Her husband has upcoming back surgery.   Today, she denies symptoms of palpitations, chest pain, orthopnea, PND, dizziness, presyncope, syncope, snoring, daytime somnolence, bleeding, or neurologic sequela. The patient is tolerating medications without difficulties and is otherwise without complaint today.    Atrial Fibrillation Risk Factors:  she does not have symptoms or diagnosis of sleep apnea. she does not have a history of rheumatic fever.   she has a BMI of Body mass index is 36.08 kg/m.Marland Kitchen Filed Weights   05/04/20 1421  Weight: 95.3 kg    Family History  Problem Relation Age of Onset  . Stroke Mother   . Colon cancer Mother 62  . Heart attack Father 57  . Emphysema Father   . Thyroid disease Son   . Thyroid disease Maternal  Grandmother   . Cancer Maternal Grandmother        cervical   . Thrombosis Maternal Grandfather      Atrial Fibrillation Management history:  Previous antiarrhythmic drugs: none Previous cardioversions: none Previous ablations: none CHADS2VASC score: 5 Anticoagulation history: Eliquis   Past Medical History:  Diagnosis Date  . Atherosclerosis    mild cerebral  . Atrial fibrillation and flutter ([place])   . Autoimmune disorder ([place])   . Cervical disc disease    with spondylosis  . Cervical spondylosis   . Chronic dermatitis    eczematous  . Colitis   . DDD (degenerative disc disease), lumbar   . Frozen shoulder    left  . Hyperlipidemia   . Hypertension   . Lateral epicondylitis   . Lumbar spondylosis   . Movement disorder   . Multiple lesions on CT of brain and spine    reports lesions from [facility] CT [facility]   . 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 CATARACT EXTRACTION    . COLONOSCOPY    . EYE SURGERY Left   . HEMORRHOID BANDING    . LOOP RECORDER INSERTION N/A 07/05/2017   Procedure: LOOP RECORDER INSERTION;  Surgeon: Thompson Grayer, MD;  Location: [facility];  Service: Cardiovascular;  Laterality: N/A;  . TUBAL LIGATION  1980    Current Outpatient Medications  Medication Sig Dispense Refill  . diltiazem (CARDIZEM CD) 360 MG 24 hr capsule  Take 1 capsule (360 mg total) by mouth daily. 90 capsule 3  . ELIQUIS 5 MG TABS tablet TAKE 1 TABLET BY MOUTH TWICE DAILY. 180 tablet 1  . furosemide (LASIX) 40 MG tablet TAKE 1 TABLET EACH DAY. 90 tablet 0  . gabapentin (NEURONTIN) 300 MG capsule Take 1 capsule (300 mg total) by mouth 3 (three) times daily. Take 1 tablet in the evening for 3 days.  If tolerating well, then increase to twice daily after 3 days.  Can even go up to 3 times a day if needed. 60 capsule 1  . hydroxychloroquine (PLAQUENIL) 200 MG tablet Take 200 mg by mouth 2 (two) times daily.    .  Multiple Vitamins-Minerals (ONE-A-DAY WOMENS 50 PLUS PO) Take 1 tablet by mouth daily.     . Potassium Chloride ER 20 MEQ TBCR Take 2 tablets twice daily x 2 days then decrease to 2 tablets daily 68 tablet 6  . Propylene Glycol 0.6 % SOLN Place 1 drop into both eyes at bedtime.    . clobetasol ointment (TEMOVATE) [id number] % 1 application    . ketoconazole (NIZORAL) 2 % shampoo Apply topically 3 (three) times a week.     No current facility-administered medications for this encounter.    Allergies  Allergen Reactions  . Methotrexate     Other reaction(s): swelling in ankles  . Metoprolol Succinate [Metoprolol]     Other reaction(s): swelling in ankles  . Metronidazole     Other reaction(s): leg swelling  . Other Other (See Comments)    Medication:Methychloroisothlazolinone/methylisothiazilinoneCL +Me-Isothiazolinone Reaction: unknown-patch testing Medication:glyceryl thioglycolate  Reaction:unknown-patch testing  . Quaternium-15 Other (See Comments)    unknown reaction-patching testing    Social History   Socioeconomic History  . Marital status: Married    Spouse name: [name]  . Number of children: 2  . Years of education: MAx2  . Highest education level: Not on file  Occupational History  . Occupation: Retired    Fish farm manager: OTHER  Tobacco Use  . Smoking status: Never Smoker  . Smokeless tobacco: Never Used  Vaping Use  . Vaping Use: Never used  Substance and Sexual Activity  . Alcohol use: Yes    Alcohol/week: 4.0 standard drinks    Types: 4 Glasses of wine per week    Comment: 4 glasses of wine weekly  . Drug use: No  . Sexual activity: Not Currently  Other Topics Concern  . Not on file  Social History Narrative   Patient lives at home with her spouse.   Caffeine Use: 2-3 cups daily   Social Determinants of Health   Financial Resource Strain: Not on file  Food Insecurity: Not on file  Transportation Needs: Not on file  Physical Activity: Not on file  Stress:  Not on file  Social Connections: Not on file  Intimate Partner Violence: Not on file     ROS- All systems are reviewed and negative except as per the HPI above.  Physical Exam: Vitals:   05/04/20 1421  BP: 128/68  Pulse: 90  Weight: 95.3 kg  Height: 5\' 4"  (1.626 m)    GEN- The patient is well appearing obese elderly female, alert and oriented x 3 today.   HEENT-head normocephalic, atraumatic, sclera clear, conjunctiva pink, hearing intact, trachea midline. Lungs- Clear to ausculation bilaterally, normal work of breathing Heart- irregular rate and rhythm, no murmurs, rubs or gallops  GI- soft, NT, ND, + BS Extremities- no clubbing, cyanosis. Trace bilateral edema MS- no significant  deformity or atrophy Skin- no rash or lesion Psych- euthymic mood, full affect Neuro- strength and sensation are intact   Wt Readings from Last 3 Encounters:  05/04/20 95.3 kg  04/05/20 96.7 kg  03/24/20 96.9 kg    EKG today demonstrates  Afib, NST Vent. rate 90 BPM QRS duration 90 ms QT/QTc 386/472 ms  Echo 05/25/19 demonstrated  1. Left ventricular ejection fraction, by visual estimation, is 60 to  65%. The left ventricle has normal function. There is no left ventricular  hypertrophy.  2. The left ventricle has no regional wall motion abnormalities.  3. Left ventricular diastolic parameters are consistent with Grade I  diastolic dysfunction (impaired relaxation).  4. Elevated left ventricular end-diastolic pressure.  5. Global right ventricle has normal systolic function.The right  ventricular size is normal. No increase in right ventricular wall  thickness.  6. Left atrial size was mild-moderately dilated.  7. Right atrial size was normal.  8. The mitral valve is normal in structure. No evidence of mitral valve  regurgitation. No evidence of mitral stenosis.  9. The tricuspid valve is normal in structure. Tricuspid valve  regurgitation is trivial.  10. The aortic valve is  tricuspid. Aortic valve regurgitation is not  visualized. Mild aortic valve sclerosis without stenosis.  11. The pulmonic valve was normal in structure. Pulmonic valve  regurgitation is not visualized.  12. 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  13. Normal pulmonary artery systolic pressure.  14. The inferior vena cava is normal in size with greater than 50%  respiratory variability, suggesting right atrial pressure of 3 mmHg.   Epic records are reviewed at length today  CHA2DS2-VASc Score = 5  The patient's score is based upon: CHF History: No HTN History: Yes Diabetes History: No Stroke History: No Vascular Disease History: Yes Age Score: 2 Gender Score: 1      ASSESSMENT AND PLAN: 1. Persistent Atrial Fibrillation (ICD10:  I48.19) The patient's CHA2DS2-VASc score is 5, indicating a 7.2% annual risk of stroke.   ILR shows 100% burden with controlled average V rates. We again discussed therapeutic options today including AAD (dofetilide, amiodarone) and DCCV. Per patient preference, she has deferred AAD for now in order to care for her husband after his back surgery. Unclear how symptomatic she is with her afib. Patient would also like to discuss with EP if ablation is a possibility. Will refer her back to Dr Rayann Heman. Continue Eliquis 5 mg BID Continue diltiazem 360 mg daily  2. Secondary Hypercoagulable State (ICD10:  D68.69) The patient is at significant risk for stroke/thromboembolism based upon her CHA2DS2-VASc Score of 5.  Continue Apixaban (Eliquis).   3. Obesity Body mass index is 36.08 kg/m. Lifestyle modification was discussed and encouraged including regular physical activity and weight reduction.  4. CAD CTA showed no obstructive disease. No anginal symptoms.  5. HTN Stable, no changes today.   Follow up with Dr Rayann Heman in 3 months.    [facility] [address] [place], [place]  [address] [phone] 05/04/2020 3:44 PM

## 2020-05-19 ENCOUNTER — Other Ambulatory Visit: Payer: Self-pay | Admitting: Cardiology

## 2020-05-20 MED ORDER — FUROSEMIDE 40 MG PO TABS: 60.0000 mg | ORAL_TABLET | Freq: Every day | ORAL | 3 refills | Status: DC

## 2020-05-24 ENCOUNTER — Encounter: Payer: Self-pay | Admitting: Cardiovascular Disease

## 2020-05-24 ENCOUNTER — Ambulatory Visit: Payer: Medicare PPO | Admitting: Cardiovascular Disease

## 2020-05-24 ENCOUNTER — Other Ambulatory Visit: Payer: Self-pay

## 2020-05-24 VITALS — BP 120/72 | HR 66 | Ht 64.0 in | Wt 217.2 lb

## 2020-05-24 DIAGNOSIS — I4819 Other persistent atrial fibrillation: Secondary | ICD-10-CM | POA: Diagnosis not present

## 2020-05-24 DIAGNOSIS — I890 Lymphedema, not elsewhere classified: Secondary | ICD-10-CM | POA: Diagnosis not present

## 2020-05-24 NOTE — Progress Notes (Signed)
Cardiology Office Note   Date:  05/24/2020   ID:  Lindsay Clark, DOB 08/07/1940, MRN [id number]  PCP:  [name], MD  Cardiologist: Dr. [name]  No chief complaint on file.     History of Present Illness: Lindsay Clark is a 80 y.o. female who was referred by Dr. [name] for evaluation of leg edema. She has known history of atrial fibrillation, hyperlipidemia, coronary artery disease and essential hypertension.  She is on long-term anticoagulation with Eliquis.  She is currently being treated with diltiazem for rate control. She reports prolonged history of bilateral leg swelling most noticeable on the left side.  She had this almost all her life and the swelling usually gets worse at the end of the day.  She was switched from metoprolol to diltiazem in 2019 for better rate control.  She does report that the swelling worsened since that time.  She has multiple venous Doppler studies that showed no evidence of DVT.  Recent study showed no evidence of reflux either.  She occasionally has to take prednisone.  She denies chest pain.  She does have some palpitations and shortness of breath that seems to be due to atrial fibrillation. She does take furosemide almost daily but that has not improved the swelling significantly.  Past Medical History:  Diagnosis Date  . Atherosclerosis    mild cerebral  . Atrial fibrillation and flutter ([place])   . Autoimmune disorder ([place])   . Cervical disc disease    with spondylosis  . Cervical spondylosis   . Chronic dermatitis    eczematous  . Colitis   . DDD (degenerative disc disease), lumbar   . Frozen shoulder    left  . Hyperlipidemia   . Hypertension   . Lateral epicondylitis   . Lumbar spondylosis   . Movement disorder   . Multiple lesions on CT of brain and spine    reports lesions from [facility]   . 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 CATARACT EXTRACTION     . COLONOSCOPY    . EYE SURGERY Left   . HEMORRHOID BANDING    . LOOP RECORDER INSERTION N/A 07/05/2017   Procedure: LOOP RECORDER INSERTION;  Surgeon: Thompson Grayer, MD;  Location: [facility];  Service: Cardiovascular;  Laterality: N/A;  . TUBAL LIGATION  1980     Current Outpatient Medications  Medication Sig Dispense Refill  . clobetasol ointment (TEMOVATE) 0.99 % 1 application    . diltiazem (CARDIZEM CD) 360 MG 24 hr capsule Take 1 capsule (360 mg total) by mouth daily. 90 capsule 3  . ELIQUIS 5 MG TABS tablet TAKE 1 TABLET BY MOUTH TWICE DAILY. 60 tablet 5  . furosemide (LASIX) 40 MG tablet Take 1.5 tablets (60 mg total) by mouth daily. 135 tablet 3  . gabapentin (NEURONTIN) 300 MG capsule Take 1 capsule (300 mg total) by mouth 3 (three) times daily. Take 1 tablet in the evening for 3 days.  If tolerating well, then increase to twice daily after 3 days.  Can even go up to 3 times a day if needed. 60 capsule 1  . hydroxychloroquine (PLAQUENIL) 200 MG tablet Take 200 mg by mouth 2 (two) times daily.    Marland Kitchen ketoconazole (NIZORAL) 2 % shampoo Apply topically 3 (three) times a week.    . Multiple Vitamins-Minerals (ONE-A-DAY WOMENS 50 PLUS PO) Take 1 tablet  by mouth daily.     . Potassium Chloride ER 20 MEQ TBCR Take 2 tablets twice daily x 2 days then decrease to 2 tablets daily 68 tablet 6  . Propylene Glycol 0.6 % SOLN Place 1 drop into both eyes at bedtime.     No current facility-administered medications for this visit.    Allergies:   Methotrexate, Metoprolol succinate [metoprolol], Metronidazole, Other, and Quaternium-15    Social History:  The patient  reports that she has never smoked. She has never used smokeless tobacco. She reports current alcohol use of about 4.0 standard drinks of alcohol per week. She reports that she does not use drugs.   Family History:  The patient's family history includes Cancer in her maternal grandmother; Colon cancer (age of onset: 64) in  her mother; Emphysema in her father; Heart attack (age of onset: 84) in her father; Stroke in her mother; Thrombosis in her maternal grandfather; Thyroid disease in her maternal grandmother and son.    ROS:  Please see the history of present illness.   Otherwise, review of systems are positive for none.   All other systems are reviewed and negative.    PHYSICAL EXAM: VS:  BP 120/72   Pulse 66   Ht 5\' 4"  (1.626 m)   Wt 217 lb 3.2 oz (98.5 kg)   BMI 37.28 kg/m  , BMI Body mass index is 37.28 kg/m. GEN: Well nourished, well developed, in no acute distress  HEENT: normal  Neck: no JVD, carotid bruits, or masses Cardiac: Irregularly irregular; no murmurs, rubs, or gallops, moderate edema affecting the left lower extremity and mild on the right side.  The edema extends all the way to the foot. Respiratory:  clear to auscultation bilaterally, normal work of breathing GI: soft, nontender, nondistended, + BS MS: no deformity or atrophy  Skin: warm and dry, no rash Neuro:  Strength and sensation are intact Psych: euthymic mood, full affect   EKG:  EKG is not ordered today.    Recent Labs: 03/24/2020: BUN 17; Creatinine, Ser 0.66; Hemoglobin 14.7; Platelets 217; Potassium 4.2; Sodium 144    Lipid Panel No results found for: CHOL, TRIG, HDL, CHOLHDL, VLDL, LDLCALC, LDLDIRECT    Wt Readings from Last 3 Encounters:  05/24/20 217 lb 3.2 oz (98.5 kg)  05/04/20 210 lb 3.2 oz (95.3 kg)  04/05/20 213 lb 3.2 oz (96.7 kg)       No flowsheet data found.    ASSESSMENT AND PLAN:  1.  Lymphedema: The patient seems to have lymphedema affecting the left lower extremity more than the right lower extremity.  I do suspect that diltiazem has worsened the swelling.  I asked her to consider switching to a beta-blocker but she reports that metoprolol did not control her rate very well before.  I suspect that she was not on a high enough dose.  She wants to discuss with her primary cardiologist. In  addition, I advised her to elevate her legs frequently during the day.  I also provided her with a prescription for knee-high support stocking to be used during the day.  2.  Persistent atrial fibrillation: She is on anticoagulation with Eliquis.  She is considering ablation.  Recommend switching diltiazem to a beta-blocker given worsening leg edema.    Disposition:   FU with me as needed.  Signed,  Kathlyn Sacramento, MD  05/24/2020 1:19 PM    [facility]

## 2020-05-24 NOTE — Patient Instructions (Signed)
Medication Instructions:  No changes *If you need a refill on your cardiac medications before your next appointment, please call your pharmacy*   Lab Work: None ordered If you have labs (blood work) drawn today and your tests are completely normal, you will receive your results only by: Marland Kitchen MyChart Message (if you have MyChart) OR . A paper copy in the mail If you have any lab test that is abnormal or we need to change your treatment, we will call you to review the results.   Testing/Procedures: None ordered   Follow-Up: At [facility], you and your health needs are our priority.  As part of our continuing mission to provide you with exceptional heart care, we have created designated Provider Care Teams.  These Care Teams include your primary Cardiologist (physician) and Advanced Practice Providers (APPs -  Physician Assistants and Nurse Practitioners) who all work together to provide you with the care you need, when you need it.  We recommend signing up for the patient portal called "MyChart".  Sign up information is provided on this After Visit Summary.  MyChart is used to connect with patients for Virtual Visits (Telemedicine).  Patients are able to view lab/test results, encounter notes, upcoming appointments, etc.  Non-urgent messages can be sent to your provider as well.   To learn more about what you can do with MyChart, go to [email or web address].    Your next appointment:   Follow up as needed with Dr. Fletcher Anon   Other Instructions Wear compression stockings during the day

## 2020-05-25 ENCOUNTER — Ambulatory Visit: Payer: Medicare PPO | Admitting: Internal Medicine

## 2020-05-25 ENCOUNTER — Ambulatory Visit: Payer: Medicare PPO | Admitting: Orthopaedic Surgery

## 2020-05-25 DIAGNOSIS — I10 Essential (primary) hypertension: Secondary | ICD-10-CM | POA: Diagnosis not present

## 2020-05-25 DIAGNOSIS — D6869 Other thrombophilia: Secondary | ICD-10-CM

## 2020-05-25 DIAGNOSIS — G8929 Other chronic pain: Secondary | ICD-10-CM | POA: Diagnosis not present

## 2020-05-25 DIAGNOSIS — I4819 Other persistent atrial fibrillation: Secondary | ICD-10-CM

## 2020-05-25 DIAGNOSIS — M1712 Unilateral primary osteoarthritis, left knee: Secondary | ICD-10-CM

## 2020-05-25 DIAGNOSIS — M25562 Pain in left knee: Secondary | ICD-10-CM

## 2020-05-25 MED ORDER — METHYLPREDNISOLONE ACETATE 40 MG/ML IJ SUSP
40.0000 mg | INTRAMUSCULAR | Status: AC | PRN
  Administered 2020-05-25: 40 mg via INTRA_ARTICULAR

## 2020-05-25 MED ORDER — LIDOCAINE HCL 1 % IJ SOLN
3.0000 mL | INTRAMUSCULAR | Status: AC | PRN
Start: 2020-05-25 — End: 2020-05-25
  Administered 2020-05-25: 3 mL

## 2020-05-25 NOTE — Progress Notes (Signed)
Office Visit Note   Patient: Lindsay Clark           Date of Birth: 25-Oct-1940           MRN: [id number] Visit Date: 05/25/2020              Requested by: [name], MD [address] [place],  [place] [address] PCP: [name], MD   Assessment & Plan: Visit Diagnoses:  1. Chronic pain of left knee   2. Unilateral primary osteoarthritis, left knee     Plan: I was able to aspirate 50 cc of yellow clear fluid off of her knee.  We will send that for cell count and crystal analysis.  We will try hinged knee brace for her knee in the interim.  I did place a steroid injection in her knee as well.  She is requesting another MRI of her left knee and I think this is reasonable considering the worsening pain with pivoting activities.  The previous MRI did not show meniscal tear but certainly that could be that this is worsening for her.  She knows to call us for follow-up appointment once the MRI date is established.  I would like to see her back at least 2 days after that.  All questions and concerns were answered and addressed.  Follow-Up Instructions: No follow-ups on file.   Orders:  No orders of the defined types were placed in this encounter.  No orders of the defined types were placed in this encounter.     Procedures: Large Joint Inj: L knee on 05/25/2020 10:14 AM Indications: diagnostic evaluation and pain Details: 22 G 1.5 in needle, superolateral approach  Arthrogram: No  Medications: 3 mL lidocaine 1 %; 40 mg methylPREDNISolone acetate 40 MG/ML Outcome: tolerated well, no immediate complications Procedure, treatment alternatives, risks and benefits explained, specific risks discussed. Consent was given by the patient. Immediately prior to procedure a time out was called to verify the correct patient, procedure, equipment, support staff and site/side marked as required. Patient was prepped and draped in the usual sterile fashion.       Clinical  Data: No additional findings.   Subjective: Chief Complaint  Patient presents with  . Left Knee - Pain  The patient is a 80 year old active female well-known to me.  She has unfortunately significant arthritis of both her knees the left knee is really flared up quite a bit that she is concerned about it.  She also has some lymphedema that may be related to medication that she is on.  She does take Eliquis for blood thinning.  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  There was no meniscal tear.  She feels like now that there is some type of tear and she had a bout of swelling of the knee and such difficulty getting around that she feels like something else needs to be done for that knee.  HPI  Review of Systems She currently denies a headache, chest pain, shortness of breath, fever, chills, nausea, vomiting  Objective: Vital Signs: There were no vitals taken for this visit.  Physical Exam She is alert and orient x3 and in no acute distress Ortho Exam Examination of her left knee does show moderate effusion.  There is valgus malalignment.  There is significant pain all along the IT band as well as the lateral and  posterior compartment of the left knee. Specialty Comments:  No specialty comments available.  Imaging: No results found.   PMFS History: Patient Active Problem List   Diagnosis Date Noted  . Persistent atrial fibrillation ([place]) 04/05/2020  . Secondary hypercoagulable state ([place]) 04/05/2020  . Primary osteoarthritis of left knee 09/02/2019  . Prolapsed internal hemorrhoids, grade 2 03/03/2019  . Brain lesion 06/24/2017  . Osteoarthritis   . Multiple lesions on CT of brain and spine   . Movement disorder   . Lumbar spondylosis   . Hyperlipidemia   . DDD (degenerative disc disease), lumbar   . Colitis   . Dermatitis   . Cervical spondylosis   . Cervical disc disease   .  Diastolic dysfunction 34/28/7681  . Syncope 04/18/2016  . Anxiety 04/18/2016  . Hypertension   . Autoimmune disorder-autoimmune encephalitis   . Dystonia 03/16/2015  . Coronary artery calcification of native artery 11/04/2014  . Abnormal finding on MRI of brain 10/04/2014  . Pruritus 11/30/2011  . 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 Atherosclerosis    mild cerebral  . Atrial fibrillation and flutter ([place])   . Autoimmune disorder ([place])   . Cervical disc disease    with spondylosis  . Cervical spondylosis   . Chronic dermatitis    eczematous  . Colitis   . DDD (degenerative disc disease), lumbar   . Frozen shoulder    left  . Hyperlipidemia   . Hypertension   . Lateral epicondylitis   . Lumbar spondylosis   . Movement disorder   . Multiple lesions on CT of brain and spine    reports lesions from previous CT [facility]   . 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    Family History  Problem Relation Age of Onset  . Stroke Mother   . Colon cancer Mother 39  . Heart attack Father 77  . Emphysema Father   . Thyroid disease Son   . Thyroid disease Maternal Grandmother   . Cancer Maternal Grandmother        cervical   . Thrombosis Maternal Grandfather     Past Surgical History:  Procedure Laterality Date  . CATARACT EXTRACTION    . COLONOSCOPY    . EYE SURGERY Left   . HEMORRHOID BANDING    . LOOP RECORDER INSERTION N/A 07/05/2017   Procedure: LOOP RECORDER INSERTION;  Surgeon: Thompson Grayer, MD;  Location: [facility];  Service: Cardiovascular;  Laterality: N/A;  . TUBAL LIGATION  1980   Social History   Occupational History  . Occupation: Retired    Fish farm manager: [employer]  Tobacco Use  . Smoking status: Never Smoker  . Smokeless tobacco: Never Used  Vaping Use  . Vaping Use: Never used  Substance and Sexual Activity  . Alcohol use: Yes    Alcohol/week: 4.0 standard drinks    Types: 4 Glasses of wine per week     Comment: 4 glasses of wine weekly  . Drug use: No  . Sexual activity: Not Currently

## 2020-05-25 NOTE — Addendum Note (Signed)
Addended by: Jacklyn Shell on: 05/25/2020 10:35 AM   Modules accepted: Orders

## 2020-05-25 NOTE — Progress Notes (Signed)
PCP: [name], MD Primary Cardiologist: Dr [name] Primary EP: Dr Rayann Heman  Lindsay Clark is a 80 y.o. female who presents today for routine electrophysiology followup.  Since last being seen in our clinic, the patient reports doing reasonably well.  She has been in persistent afib since at least October.  She has SOB chronically and edema but feels that they have worsened.  Dr Fletcher Anon has convinced her that her edema is due to diltiazem.  She did not have adequate rate control with metoprolol.   Today, she denies symptoms of palpitations, chest pain,  dizziness, presyncope, or syncope.  The patient is otherwise without complaint today.   Past Medical History:  Diagnosis Date  . Atherosclerosis    mild cerebral  . Atrial fibrillation and flutter ([place])   . Autoimmune disorder ([place])   . Cervical disc disease    with spondylosis  . Cervical spondylosis   . Chronic dermatitis    eczematous  . Colitis   . DDD (degenerative disc disease), lumbar   . Frozen shoulder    left  . Hyperlipidemia   . Hypertension   . Lateral epicondylitis   . Lumbar spondylosis   . Movement disorder   . Multiple lesions on CT of brain and spine    reports lesions from previous CT scan at [facility]   . 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 CATARACT EXTRACTION    . COLONOSCOPY    . EYE SURGERY Left   . HEMORRHOID BANDING    . LOOP RECORDER INSERTION N/A 07/05/2017   Procedure: LOOP RECORDER INSERTION;  Surgeon: Thompson Grayer, MD;  Location: [facility];  Service: Cardiovascular;  Laterality: N/A;  . TUBAL LIGATION  1980    ROS- all systems are reviewed and negatives except as per HPI above  Current Outpatient Medications  Medication Sig Dispense Refill  . clobetasol ointment (TEMOVATE) 1.61 % 1 application    . diltiazem (CARDIZEM CD) 360 MG 24 hr capsule Take 1 capsule (360 mg total) by mouth daily. 90 capsule 3  . ELIQUIS 5 MG TABS tablet  TAKE 1 TABLET BY MOUTH TWICE DAILY. 60 tablet 5  . furosemide (LASIX) 40 MG tablet Take 1.5 tablets (60 mg total) by mouth daily. 135 tablet 3  . gabapentin (NEURONTIN) 300 MG capsule Take 1 capsule (300 mg total) by mouth 3 (three) times daily. Take 1 tablet in the evening for 3 days.  If tolerating well, then increase to twice daily after 3 days.  Can even go up to 3 times a day if needed. 60 capsule 1  . hydroxychloroquine (PLAQUENIL) 200 MG tablet Take 200 mg by mouth 2 (two) times daily.    Marland Kitchen ketoconazole (NIZORAL) 2 % shampoo Apply topically 3 (three) times a week.    . Multiple Vitamins-Minerals (ONE-A-DAY WOMENS 50 PLUS PO) Take 1 tablet by mouth daily.     . Potassium Chloride ER 20 MEQ TBCR Take 2 tablets twice daily x 2 days then decrease to 2 tablets daily 68 tablet 6  . Propylene Glycol 0.6 % SOLN Place 1 drop into both eyes at bedtime.     No current facility-administered medications for this visit.    Physical Exam: There were no vitals filed for this visit.  GEN- The patient is overweight appearing, alert and oriented x 3 today.   Head- normocephalic, atraumatic Eyes-  Sclera clear, conjunctiva pink Ears- hearing intact  Oropharynx- clear Lungs- C normal work of breathing Heart- irregular rate and rhythm  GI- soft, NT, ND, + BS Extremities- no clubbing, cyanosis, + edema  Wt Readings from Last 3 Encounters:  05/24/20 217 lb 3.2 oz (98.5 kg)  05/04/20 210 lb 3.2 oz (95.3 kg)  04/05/20 213 lb 3.2 oz (96.7 kg)    EKG tracing ordered today is personally reviewed and shows afib  Assessment and Plan:  1. Persistent Atrial fibrillation/ atrial flutter The patient has symptomatic, recurrent persistent atrial fibrillation. she has not tried AAD therapy Her ILR reveals that she has been in afib persistently at least 90 days, but certainly longer.   Chads2vasc score is 4.  she is anticoagulated with eliquis . Therapeutic strategies for afib including medicine (tikosyn,  amiodarone) and ablation were discussed in detail with the patient today. Risk, benefits, and alternatives to each approach were discussed.  Her anticipated success with ablation is low.  As per AHA/HRS guidelines, I would advise AAD therapy prior to ablation.  I think her best option is tikosyn.   Unfortunately, we cannot currently hospitalize her for tikosyn due to COVID 19 situation.  Once we have beds, we could offer tikosyn admission.  She reports that her husband is having back surgery in the next 2-3 weeks and she would like to wait until he is fully recovered.  She does not wish to proceed with tikosyn until April. I will have her follow-up in the AF clinic in April to arrange tikosyn load.  The importance of lifestyle modification was discussed at length today.  We also discussed the importance of compliance with eliquis. Dr Fletcher Anon has convinced her that her edema is due to diltiazem.  She did not have adequate rate control with metoprolol.  I suspect that her edema is multifactorial.  I do not feel that we have other options for rate control currently as she is not interested in trying beta blockers again.  Perhaps once on tikosyn, we could try to wean diltiazem. Her EF is preserved. If she decides to proceed with tikosyn sooner, she should contact the AF clinic.  2. HTN Stable No change required today  3. Obesity There is no height or weight on file to calculate BMI. Lifestyle modification is advised  Risks, benefits and potential toxicities for medications prescribed and/or refilled reviewed with patient today.   Thompson Grayer MD, [facility] 05/25/2020 4:54 PM

## 2020-05-25 NOTE — Patient Instructions (Addendum)
Medication Instructions:  Your physician recommends that you continue on your current medications as directed. Please refer to the Current Medication list given to you today.  Labwork: None ordered.  Testing/Procedures: None ordered.  Follow-Up:  Your physician wants you to follow-up in: 2 months with the Afib Clinic. They will contact you to schedule.    You will receive a reminder letter in the mail two months in advance. If you don't receive a letter, please call our office to schedule the follow-up appointment.  Any Other Special Instructions Will Be Listed Below (If Applicable).  If you need a refill on your cardiac medications before your next appointment, please call your pharmacy.

## 2020-05-26 ENCOUNTER — Other Ambulatory Visit: Payer: Self-pay

## 2020-05-26 DIAGNOSIS — G8929 Other chronic pain: Secondary | ICD-10-CM

## 2020-05-26 LAB — SYNOVIAL FLUID ANALYSIS, COMPLETE
Basophils, %: 0 %
Eosinophils-Synovial: 0 % (ref 0–2)
Lymphocytes-Synovial Fld: 65 % (ref 0–74)
Monocyte/Macrophage: 20 % (ref 0–69)
Neutrophil, Synovial: 15 % (ref 0–24)
Synoviocytes, %: 0 % (ref 0–15)
WBC, Synovial: 78 cells/uL (ref <150)

## 2020-05-27 NOTE — Addendum Note (Signed)
Addended by: Rose Phi on: 05/27/2020 09:14 AM   Modules accepted: Orders

## 2020-05-30 ENCOUNTER — Telehealth: Payer: Self-pay

## 2020-05-30 ENCOUNTER — Ambulatory Visit (INDEPENDENT_AMBULATORY_CARE_PROVIDER_SITE_OTHER): Payer: Medicare PPO

## 2020-05-30 DIAGNOSIS — I4819 Other persistent atrial fibrillation: Secondary | ICD-10-CM | POA: Diagnosis not present

## 2020-05-30 NOTE — Telephone Encounter (Signed)
Patient called she wants the referral for MRI  that was sent to [place] to be sent to [facility] imaging instead due to scheduling she stated they have a opening for her soon she is requesting this gets done ASAP call back:[phone]

## 2020-05-30 NOTE — Telephone Encounter (Signed)
Order changed to [facility]

## 2020-05-30 NOTE — Telephone Encounter (Signed)
Pt called stating when she came into the office wedensday a brace was placed over her pants leg. Pt states the brace will not stay in place please advise

## 2020-05-30 NOTE — Telephone Encounter (Signed)
Patient coming to switch out brace

## 2020-05-30 NOTE — Telephone Encounter (Signed)
MRI already ordered

## 2020-05-31 LAB — CUP PACEART REMOTE DEVICE CHECK
Date Time Interrogation Session: 20220206030021
Implantable Pulse Generator Implant Date: 20190315

## 2020-06-01 DIAGNOSIS — R159 Full incontinence of feces: Secondary | ICD-10-CM | POA: Diagnosis not present

## 2020-06-01 DIAGNOSIS — Z86010 Personal history of colonic polyps: Secondary | ICD-10-CM | POA: Diagnosis not present

## 2020-06-02 ENCOUNTER — Telehealth: Payer: Self-pay | Admitting: Orthopaedic Surgery

## 2020-06-02 NOTE — Telephone Encounter (Signed)
Patient called. Says she was able to get the MRI 06/03/2020

## 2020-06-02 NOTE — Telephone Encounter (Signed)
FYI

## 2020-06-03 ENCOUNTER — Other Ambulatory Visit: Payer: Self-pay

## 2020-06-03 ENCOUNTER — Ambulatory Visit
Admission: RE | Admit: 2020-06-03 | Discharge: 2020-06-03 | Disposition: A | Discharge status: Home / Self Care | Payer: Medicare PPO | Source: Ambulatory Visit | Attending: Orthopaedic Surgery | Admitting: Orthopaedic Surgery

## 2020-06-03 DIAGNOSIS — M25562 Pain in left knee: Secondary | ICD-10-CM | POA: Diagnosis not present

## 2020-06-03 DIAGNOSIS — G8929 Other chronic pain: Secondary | ICD-10-CM

## 2020-06-03 NOTE — Progress Notes (Signed)
Carelink Summary Report / Loop Recorder 

## 2020-06-05 ENCOUNTER — Encounter: Payer: Self-pay | Admitting: Orthopaedic Surgery

## 2020-06-10 ENCOUNTER — Other Ambulatory Visit: Payer: Medicare PPO

## 2020-06-14 ENCOUNTER — Telehealth: Payer: Self-pay | Admitting: Pharmacist

## 2020-06-14 NOTE — Telephone Encounter (Signed)
Medication list reviewed in anticipation of upcoming Tikosyn initiation. Patient is taking hydroxychloroquine which is a QTc prolonging medication, however it is not contraindicated. Recommend monitoring QTc closely.   Patient is anticoagulated on Eliquis 5 mg BID on the appropriate dose. Please ensure that patient has not missed any anticoagulation doses in the 3 weeks prior to Tikosyn initiation.   Patient will need to be counseled to avoid use of Benadryl while on Tikosyn and in the 2-3 days prior to Tikosyn initiation.

## 2020-06-15 ENCOUNTER — Encounter: Payer: Self-pay | Admitting: Orthopaedic Surgery

## 2020-06-15 ENCOUNTER — Ambulatory Visit: Payer: Medicare PPO | Admitting: Orthopaedic Surgery

## 2020-06-15 DIAGNOSIS — G8929 Other chronic pain: Secondary | ICD-10-CM

## 2020-06-15 DIAGNOSIS — M25562 Pain in left knee: Secondary | ICD-10-CM

## 2020-06-15 DIAGNOSIS — M1712 Unilateral primary osteoarthritis, left knee: Secondary | ICD-10-CM

## 2020-06-15 NOTE — Progress Notes (Signed)
Lindsay Clark comes in today to go over the MRI of her left knee.  She is 80 years old and very active but she has had debilitating pain with her left knee.  We have aspirated fluid off the left knee which is consistent with osteoarthritis.  She has valgus malalignment of that knee and x-rays showing significant arthritis in the knee.  The knee does have a recurrent effusion again.  Her left knee does hurt on a daily basis and it is definitely affecting her mobility, her quality of life and her activity living.  She has chronic A. fib and is on Eliquis.  Her daughter is with her today as well.  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  At this point I talked about knee replacement surgery in detail.  I discussed the risk and benefits of the surgery.  I showed her knee model and talked about the surgery from beginning to end with interoperative and postoperative course was discussed.  She is going to take this in consideration and call us when she is ready to have this scheduled.  She is on Eliquis if she would need to stop this medication for 3 full days prior to knee replacement surgery.  I would ask to have her stop it on a Monday for a Friday surgery.  We talked in detail about knee replacement surgery.  She is helping her husband get over extensive lumbar spine surgery.  She will be in touch when she would like [place] to schedule the surgery.

## 2020-06-17 DIAGNOSIS — M25562 Pain in left knee: Secondary | ICD-10-CM | POA: Diagnosis not present

## 2020-06-17 DIAGNOSIS — R609 Edema, unspecified: Secondary | ICD-10-CM | POA: Diagnosis not present

## 2020-06-17 DIAGNOSIS — I4819 Other persistent atrial fibrillation: Secondary | ICD-10-CM | POA: Diagnosis not present

## 2020-06-18 DIAGNOSIS — G0490 Encephalitis and encephalomyelitis, unspecified: Secondary | ICD-10-CM | POA: Diagnosis not present

## 2020-06-20 ENCOUNTER — Telehealth: Payer: Self-pay | Admitting: Cardiology

## 2020-06-20 MED ORDER — METOPROLOL SUCCINATE ER 100 MG PO TB24: 100.0000 mg | ORAL_TABLET | Freq: Every day | ORAL | 3 refills | Status: DC

## 2020-06-20 NOTE — Telephone Encounter (Signed)
Spoke with pt, she was seen by dr Moreen Fowler and he feels the swelling in her leg is due to the diltiazem and suggest she go back to the metoprolol at a higher dosage to control her heart rate for the atrial fib until they can get it under control. She would like to change back to the metoprolol and stop the diltiazem. Will forward to dr [name] for metoprolol dosing.

## 2020-06-20 NOTE — Telephone Encounter (Signed)
See my chart encounter.

## 2020-06-20 NOTE — Telephone Encounter (Signed)
See previous note Kirk Ruths

## 2020-06-20 NOTE — Telephone Encounter (Signed)
Pt c/o medication issue:  1. Name of Medication: diltiazem (CARDIZEM CD) 360 MG 24 hr capsule  2. How are you currently taking this medication (dosage and times per day)? As prescribed  3. Are you having a reaction (difficulty breathing--STAT)? Yes   4. What is your medication issue?   Patient states she assumes this medication has caused increased swelling in her legs (see 06/20/20 patient message). Patient did not provide additional information regarding the swelling. She states she will discuss further when she speaks with Dr. Jacalyn Lefevre nurse.

## 2020-06-21 ENCOUNTER — Telehealth: Payer: Self-pay | Admitting: Pharmacist

## 2020-06-21 DIAGNOSIS — L932 Other local lupus erythematosus: Secondary | ICD-10-CM | POA: Diagnosis not present

## 2020-06-21 DIAGNOSIS — Z79899 Other long term (current) drug therapy: Secondary | ICD-10-CM | POA: Diagnosis not present

## 2020-06-21 DIAGNOSIS — Z6836 Body mass index (BMI) 36.0-36.9, adult: Secondary | ICD-10-CM | POA: Diagnosis not present

## 2020-06-21 DIAGNOSIS — G0490 Encephalitis and encephalomyelitis, unspecified: Secondary | ICD-10-CM | POA: Diagnosis not present

## 2020-06-21 DIAGNOSIS — E669 Obesity, unspecified: Secondary | ICD-10-CM | POA: Diagnosis not present

## 2020-06-21 DIAGNOSIS — M150 Primary generalized (osteo)arthritis: Secondary | ICD-10-CM | POA: Diagnosis not present

## 2020-06-21 DIAGNOSIS — I4891 Unspecified atrial fibrillation: Secondary | ICD-10-CM | POA: Diagnosis not present

## 2020-06-21 NOTE — Telephone Encounter (Signed)
Medication list reviewed in anticipation of upcoming Tikosyn initiation. Patient is taking one QTc prolonging medications (hydroxychloroquine). It is not recommended to use hydroxychloroquine with Tikosyn due to the combined increased risk of QTc prolongation. Patient should discuss with prescribing MD about alternative medications.  Ketoconazole is contraindicated with Tikosyn due to the risk of increased Tikosyn concentration and increased risk of cardiotoxicity. The evidence for this however, is with systemic ketoconazole. There is no systemic absorption with the shampoo therefore would be ok for patient to continue to use.  Patient is anticoagulated on Eliquis on the appropriate dose. Please ensure that patient has not missed any anticoagulation doses in the 3 weeks prior to Tikosyn initiation.   Patient will need to be counseled to avoid use of Benadryl while on Tikosyn and in the 2-3 days prior to Tikosyn initiation.  Please ensure K is at least 4 and Mag at least 2 before initiation especially since patient is on furosemide.

## 2020-06-28 ENCOUNTER — Telehealth (HOSPITAL_COMMUNITY): Payer: Self-pay | Admitting: *Deleted

## 2020-06-28 ENCOUNTER — Telehealth: Payer: Self-pay | Admitting: Cardiology

## 2020-06-28 MED ORDER — METOPROLOL SUCCINATE ER 100 MG PO TB24: ORAL_TABLET | ORAL | 3 refills | Status: DC

## 2020-06-28 NOTE — Telephone Encounter (Signed)
    [facility] Pre-operative Risk Assessment    HEARTCARE STAFF: - Please ensure there is not already an duplicate clearance open for this procedure. - Under Visit Info/Reason for Call, type in Other and utilize the format Clearance MM/DD/YY or Clearance TBD. Do not use dashes or single digits. - If request is for dental extraction, please clarify the # of teeth to be extracted.  Request for surgical clearance:  1. What type of surgery is being performed?   Left Knee Scope   2. When is this surgery scheduled? TBD    3. What type of clearance is required (medical clearance vs. Pharmacy clearance to hold med vs. Both)? Both     4. Are there any medications that need to be held prior to surgery and how long?ELIQUIS 5 MG TABS tablet [[id number]]  5. Practice name and name of physician performing surgery? [facility]    6. What is the office phone number? [phone]   7.   What is the office fax number? [phone]  8.   Anesthesia type (None, local, MAC, general) ? MAC with a Block    Lindsay Clark 06/28/2020, 3:16 PM  _________________________________________________________________   (provider comments below)

## 2020-06-28 NOTE — Telephone Encounter (Addendum)
Patient called concerned as she is seeing her heart rates around 90-105 with intermittent spikes according to her fitbit.  Reviewed linq report do see intermittent spikes but reassured the events are short in duration.  Pt is tolerating metoprolol 100mg  daily discussed with Adline Peals PA will increase metoprolol to 100mg  AM and 50mg  PM to encourage better rate control. Also discussed potential for tikosyn admission -- pt is seeing orthopedist tomorrow regarding torn mensicus - to see if she needs surgery on knee before attempting rhythm control.  Also discussed pharmacist recommends stopping hydroxychloroquine as this effects QT if we proceed with tikosyn loading. Pt very upset with this recommendation stating why has this not been seen before now. States she cannot come off this drug per her rheumatologist Dr. Lenna Gilford as she just saw her last week.  At the end of our 28 minute phone conversation - patient is going to call me back after seeing orthopedist later this week and this will help with timing of rhythm control as well as response to increased metoprolol. We will determine next steps of rhythm control at that time. Pt in agreement.

## 2020-06-29 DIAGNOSIS — G249 Dystonia, unspecified: Secondary | ICD-10-CM | POA: Diagnosis not present

## 2020-06-29 DIAGNOSIS — M25562 Pain in left knee: Secondary | ICD-10-CM | POA: Diagnosis not present

## 2020-06-29 DIAGNOSIS — I4811 Longstanding persistent atrial fibrillation: Secondary | ICD-10-CM | POA: Diagnosis not present

## 2020-06-29 NOTE — Telephone Encounter (Signed)
Patient with diagnosis of afib on Eliquis for anticoagulation.    Procedure: left knee scope Date of procedure: TBD  CHA2DS2-VASc Score = 5  This indicates a 7.2% annual risk of stroke. The patient's score is based upon: CHF History: No HTN History: Yes Diabetes History: No Stroke History: No Vascular Disease History: Yes Age Score: 2 Gender Score: 1   CrCl 75mL/min using adjusted body weight Platelet count 217K  Per office protocol, patient can hold Eliquis for 2 days prior to procedure.

## 2020-06-29 NOTE — Telephone Encounter (Signed)
   Primary Cardiologist: Kirk Ruths, MD  Chart reviewed as part of pre-operative protocol coverage. Patient was contacted 06/29/2020 in reference to pre-operative risk assessment for pending surgery as outlined below.  Lindsay Clark was last seen on 05/25/20 by Dr. Rayann Heman.  Since that day, Lindsay Clark has done well.  She has a history of nonobstructive CAD. She can complete 4.0 METS without angina.   Per our clinical pharmacist: Patient with diagnosis of afib on Eliquis for anticoagulation.    Procedure: left knee scope Date of procedure: TBD  CHA2DS2-VASc Score = 5  This indicates a 7.2% annual risk of stroke. The patient's score is based upon: CHF History: No HTN History: Yes Diabetes History: No Stroke History: No Vascular Disease History: Yes Age Score: 2 Gender Score: 1   CrCl 26mL/min using adjusted body weight Platelet count 217K  Per office protocol, patient can hold Eliquis for 2 days prior to procedure.  Therefore, based on ACC/AHA guidelines, the patient would be at acceptable risk for the planned procedure without further cardiovascular testing.   The patient was advised that if she develops new symptoms prior to surgery to contact our office to arrange for a follow-up visit, and she verbalized understanding.  I will route this recommendation to the requesting party via Epic fax function and remove from pre-op pool. Please call with questions.  Carnegie, PA 06/29/2020, 4:27 PM

## 2020-06-30 MED ORDER — METOPROLOL SUCCINATE ER 100 MG PO TB24: 100.0000 mg | ORAL_TABLET | Freq: Two times a day (BID) | ORAL | 3 refills | Status: DC

## 2020-06-30 NOTE — Telephone Encounter (Signed)
Pt called with update stating she has not seen much improvement with HRs since increasing metoprolol. BP 120/74 HR 115. Discussed with Adline Peals PA will increase metoprolol to 100mg  BID pt will call with update early next week.

## 2020-07-04 ENCOUNTER — Ambulatory Visit (INDEPENDENT_AMBULATORY_CARE_PROVIDER_SITE_OTHER): Payer: Medicare PPO

## 2020-07-04 DIAGNOSIS — I4819 Other persistent atrial fibrillation: Secondary | ICD-10-CM

## 2020-07-06 ENCOUNTER — Telehealth: Payer: Self-pay

## 2020-07-06 ENCOUNTER — Other Ambulatory Visit: Payer: Self-pay | Admitting: Orthopaedic Surgery

## 2020-07-06 DIAGNOSIS — R1032 Left lower quadrant pain: Secondary | ICD-10-CM | POA: Diagnosis not present

## 2020-07-06 DIAGNOSIS — N898 Other specified noninflammatory disorders of vagina: Secondary | ICD-10-CM | POA: Diagnosis not present

## 2020-07-06 LAB — CUP PACEART REMOTE DEVICE CHECK
Date Time Interrogation Session: 20220311025729
Implantable Pulse Generator Implant Date: 20190315

## 2020-07-06 NOTE — Telephone Encounter (Signed)
Patient called in and wants a nurse to read her results on her last transmission and she wants to know when her battery is going to hit ERI.

## 2020-07-06 NOTE — Telephone Encounter (Signed)
Spoke with pt, advised ILR device can have approximately 4 + years on the battery.  Device has not reached ERI yet.  Most recent report 05/29/20- 07/01/20 shows AF Burden 99.9% of the time.

## 2020-07-07 ENCOUNTER — Telehealth: Payer: Self-pay

## 2020-07-07 NOTE — Telephone Encounter (Signed)
FYI - I called patient to discuss scheduling TKA.  She stated it will be a couple of years before she will be ready for that.  She said she wanted you to specifically do surgery on the meniscus but you said that would not help.  Looks like she is scheduled for knee arthroscopy with [facility] in April.

## 2020-07-11 NOTE — Progress Notes (Signed)
HPI: FUPAF. 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 Patient seen in September 2017with complaints of palpitations and near syncope. Patient was scheduled for an exercise treadmill and when she came she was noted to have runs of paroxysmal atrial tachycardia/fibrillation. Procedure was canceled.Monitor October 2018 showed paroxysmal atririllation and flutter. Nuclear study October 2018 showed ejection fraction 67%, apical thinning but no ischemia.Placed on apixaban.Had implantable loop recorder placed by Dr. Rayann Heman for syncope and recurrent palpitations. She has been found to have "heart pounding" episodes not related to arrhythmia. Echo repeated 2/21 showed normal LV function, grade 1 DD, mild to moderate LAE. Coronary CTA 2/21 showed Ca score 36 and minimal (0-24%) in proximal LM, proximal and mid LAD and proximal Lcx.Since last seen,  she notes dyspnea on exertion.  No orthopnea, PND, exertional chest pain or syncope.  She does have bilateral lower extremity edema.  Current Outpatient Medications  Medication Sig Dispense Refill   ELIQUIS 5 MG TABS tablet TAKE 1 TABLET BY MOUTH TWICE DAILY. 60 tablet 5   furosemide (LASIX) 40 MG tablet Take 1.5 tablets (60 mg total) by mouth daily. 135 tablet 3   gabapentin (NEURONTIN) 300 MG capsule Take 1 capsule (300 mg total) by mouth 3 (three) times daily. Take 1 tablet in the evening for 3 days.  If tolerating well, then increase to twice daily after 3 days.  Can even go up to 3 times a day if needed. 60 capsule 1   hydroxychloroquine (PLAQUENIL) 200 MG tablet Take 200 mg by mouth 2 (two) times daily.     ketoconazole (NIZORAL) 2 % shampoo Apply topically 3 (three) times a week.     metoprolol succinate (TOPROL-XL) 100 MG 24 hr tablet Take 1 tablet (100 mg total) by mouth 2 (two) times daily. 90 tablet 3   Multiple Vitamins-Minerals (ONE-A-DAY WOMENS 50 PLUS PO) Take 1 tablet by mouth daily.      Potassium Chloride  ER 20 MEQ TBCR Take 2 tablets twice daily x 2 days then decrease to 2 tablets daily 68 tablet 6   Propylene Glycol 0.6 % SOLN Place 1 drop into both eyes at bedtime.     No current facility-administered medications for this visit.     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    Past Surgical History:  Procedure Laterality Date   CATARACT EXTRACTION     COLONOSCOPY     EYE SURGERY Left    HEMORRHOID BANDING     LOOP RECORDER INSERTION N/A 07/05/2017   Procedure: LOOP RECORDER INSERTION;  Surgeon: Thompson Grayer, MD;  Location: [facility];  Service: Cardiovascular;  Laterality: N/A;   TUBAL LIGATION  1980    Social History   Socioeconomic History   Marital status: Married    Spouse  name: [name]   Number of children: 2   Years of education: MAx2   Highest education level: Not on file  Occupational History   Occupation: Retired    Fish farm manager: OTHER  Tobacco Use   Smoking status: Never Smoker   Smokeless tobacco: Never Used  Scientific laboratory technician Use: Never used  Substance and Sexual Activity   Alcohol use: Yes    Alcohol/week: 4.0 standard drinks    Types: 4 Glasses of wine per week    Comment: 4 glasses of wine weekly   Drug use: No   Sexual activity: Not Currently  Other Topics Concern   Not on file  Social History Narrative   Patient lives at home with her spouse.   Caffeine Use: 2-3 cups daily   Social  Determinants of Health   Financial Resource Strain: Not on file  Food Insecurity: Not on file  Transportation Needs: Not on file  Physical Activity: Not on file  Stress: Not on file  Social Connections: Not on file  Intimate Partner Violence: Not on file    Family History  Problem Relation Age of Onset   Stroke Mother    Colon cancer Mother 96   Heart attack Father 56   Emphysema Father    Thyroid disease Son    Thyroid disease Maternal Grandmother    Cancer Maternal Grandmother        cervical    Thrombosis Maternal Grandfather     ROS: Knee pain but no fevers or chills, productive cough, hemoptysis, dysphasia, odynophagia, melena, hematochezia, dysuria, hematuria, rash, seizure activity, orthopnea, PND, claudication. Remaining systems are negative.  Physical Exam: Well-developed well-nourished in no acute distress.  Skin is warm and dry.  HEENT is normal.  Neck is supple.  Chest is clear to auscultation with normal expansion.  Cardiovascular exam is irregular Abdominal exam nontender or distended. No masses palpated. Extremities show 1+ edema. neuro grossly intact  ECG-May 25, 2020-atrial fibrillation with mildly elevated rate.  Personally reviewed  A/P  1 persistent atrial fibrillation-patient remains in atrial fibrillation on examination today.  Cardizem was contributing to lower extremity edema and has been discontinued.  She is now on Toprol 100 mg twice daily.  Continue present dose.  Continue apixaban.  She is scheduled to see an electrophysiologist in [facility] for consideration of ablation in 2 weeks.  If she is felt not to be a candidate she would likely need an antiarrhythmic followed by cardioversion to help maintain sinus rhythm as she is symptomatic with dyspnea on exertion.  Options for antiarrhythmic would likely be Tikosyn versus amiodarone.    2 hypertension-blood pressure controlled.  Continue present medications.  3 coronary  atherosclerosis-patient declined lipid-lowering medications previously.  She is not on aspirin given need for apixaban.  4 lower extremity edema-lower extremity edema is worse by her report.  It is 1+ on examination.  Increase Lasix to 80 mg daily.  In 1 week check potassium and renal function.  There is likely a contribution from atrial fibrillation.  5 history of syncope-no recurrences.  Kirk Ruths, MD

## 2020-07-12 NOTE — Progress Notes (Signed)
Carelink Summary Report / Loop Recorder 

## 2020-07-14 ENCOUNTER — Other Ambulatory Visit: Payer: Self-pay | Admitting: Family Medicine

## 2020-07-14 DIAGNOSIS — R1032 Left lower quadrant pain: Secondary | ICD-10-CM

## 2020-07-15 ENCOUNTER — Ambulatory Visit: Payer: Medicare PPO | Admitting: Cardiology

## 2020-07-15 ENCOUNTER — Other Ambulatory Visit: Payer: Self-pay

## 2020-07-15 ENCOUNTER — Other Ambulatory Visit: Payer: Self-pay | Admitting: Orthopaedic Surgery

## 2020-07-15 ENCOUNTER — Encounter: Payer: Self-pay | Admitting: Cardiology

## 2020-07-15 VITALS — BP 100/70 | HR 73 | Ht 64.0 in | Wt 214.0 lb

## 2020-07-15 DIAGNOSIS — I10 Essential (primary) hypertension: Secondary | ICD-10-CM | POA: Diagnosis not present

## 2020-07-15 DIAGNOSIS — I2584 Coronary atherosclerosis due to calcified coronary lesion: Secondary | ICD-10-CM | POA: Diagnosis not present

## 2020-07-15 DIAGNOSIS — R609 Edema, unspecified: Secondary | ICD-10-CM | POA: Diagnosis not present

## 2020-07-15 DIAGNOSIS — I2510 Atherosclerotic heart disease of native coronary artery without angina pectoris: Secondary | ICD-10-CM

## 2020-07-15 DIAGNOSIS — I4819 Other persistent atrial fibrillation: Secondary | ICD-10-CM

## 2020-07-15 MED ORDER — FUROSEMIDE 40 MG PO TABS: 80.0000 mg | ORAL_TABLET | Freq: Every day | ORAL | 3 refills | Status: DC

## 2020-07-15 NOTE — Patient Instructions (Signed)
Medication Instructions:   INCREASE FUROSEMIDE TO 80 MG ONCE DAILY= 2 OF THE 40 MG TABLETS ONCE DAILY  *If you need a refill on your cardiac medications before your next appointment, please call your pharmacy*   Lab Work:  Your physician recommends that you return for lab work in: [place]  If you have labs (blood work) drawn today and your tests are completely normal, you will receive your results only by: Marland Kitchen MyChart Message (if you have MyChart) OR . A paper copy in the mail If you have any lab test that is abnormal or we need to change your treatment, we will call you to review the results.   Follow-Up: At [facility], you and your health needs are our priority.  As part of our continuing mission to provide you with exceptional heart care, we have created designated Provider Care Teams.  These Care Teams include your primary Cardiologist (physician) and Advanced Practice Providers (APPs -  Physician Assistants and Nurse Practitioners) who all work together to provide you with the care you need, when you need it.  We recommend signing up for the patient portal called "MyChart".  Sign up information is provided on this After Visit Summary.  MyChart is used to connect with patients for Virtual Visits (Telemedicine).  Patients are able to view lab/test results, encounter notes, upcoming appointments, etc.  Non-urgent messages can be sent to your provider as well.   To learn more about what you can do with MyChart, go to [email or web address].    Your next appointment:   3-4 month(s)  The format for your next appointment:   In Person  Provider:   Kirk Ruths, MD

## 2020-07-22 ENCOUNTER — Telehealth: Payer: Self-pay

## 2020-07-22 DIAGNOSIS — R609 Edema, unspecified: Secondary | ICD-10-CM | POA: Diagnosis not present

## 2020-07-22 NOTE — Telephone Encounter (Signed)
The patient wants [place] to send a transmission to Dr. Willis Modena. She just wants to get a second opinion on a procedure she is getting. She gave me the fax number [phone]. The patient thanked me for my help.

## 2020-07-23 LAB — BASIC METABOLIC PANEL
BUN/Creatinine Ratio: 24 (ref 12–28)
BUN: 15 mg/dL (ref 8–27)
CO2: 26 mmol/L (ref 20–29)
Calcium: 9.1 mg/dL (ref 8.7–10.3)
Chloride: 101 mmol/L (ref 96–106)
Creatinine, Ser: 0.63 mg/dL (ref 0.57–1.00)
Glucose: 95 mg/dL (ref 65–99)
Potassium: 3.7 mmol/L (ref 3.5–5.2)
Sodium: 146 mmol/L — ABNORMAL HIGH (ref 134–144)
eGFR: 91 mL/min/{1.73_m2} (ref >59)

## 2020-07-25 ENCOUNTER — Telehealth: Payer: Self-pay

## 2020-07-25 NOTE — Progress Notes (Addendum)
COVID Vaccine Completed: x3 Date COVID Vaccine completed:  05-07-19 06-04-19 Has received booster:  02-15-20 COVID vaccine manufacturer:     Moderna     Date of COVID positive in last 90 days:  N/A  PCP - [name], MD Cardiologist - Kirk Ruths, MD  Cardiac clearance in Epic dated 06-29-20 by Fabian Sharp, PA  Chest x-ray - N/A EKG - 05-25-20 Epic Stress Test - 2018 Epic ECHO - 05-25-19 Epic Cardiac Cath -  Pacemaker/ICD device last checked: Spinal Cord Stimulator: Telemetry monitoring - 2018 Epic Loop recorder - last check 07-06-20 Cardiac/Coronary CT - 06-16-19 Epic  Sleep Study - N/A CPAP -   Fasting Blood Sugar - N/A Checks Blood Sugar _____ times a day  Blood Thinner Instructions: Eliquis 5 mg BID.  To stop 2 days before Aspirin Instructions: Last Dose:  Activity level:  Unable to go up a flight of stairs without symptoms of knee pain.  Patient performs ADLs independently without symptoms of chest pain.  Patient has chronic SOB due to Afib.       Anesthesia review: Afib, Aflutter.  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 Patient was also instructed that they will need to review over the PAT instructions again at home before surgery.

## 2020-07-25 NOTE — Patient Instructions (Addendum)
DUE TO COVID-19 ONLY ONE VISITOR IS ALLOWED TO COME WITH YOU AND STAY IN THE WAITING ROOM ONLY DURING PRE OP AND PROCEDURE.   **NO VISITORS ARE ALLOWED IN THE SHORT STAY AREA OR RECOVERY ROOM!!**    COVID SWAB TESTING MUST BE COMPLETED ON:  Monday, 08-08-20 @ 8:40 AM   [address] [place], [place] [address]  (Must self quarantine after testing. Follow instructions on handout.)       Your procedure is scheduled on:  Tuesday, 08-09-20   Report to [facility] Main  Entrance    Report to admitting at 7:45 AM   Call this number if you have problems the morning of surgery [phone]   Do not eat food :After Midnight.   May have liquids until 6:45 AM  day of surgery  CLEAR LIQUID DIET  Foods Allowed                                                                     Foods Excluded  Water, Black Coffee and tea, regular and decaf              liquids that you cannot  Plain Jell-O in any flavor  (No red)                                     see through such as: Fruit ices (not with fruit pulp)                                      milk, soups, orange juice              Iced Popsicles (No red)                                      All solid food                                   Apple juices Sports drinks like Gatorade (No red) Lightly seasoned clear broth or consume(fat free) Sugar, honey syrup     Complete one Ensure drink the morning of surgery at   6:45 AM  the day of surgery.     1. The day of surgery:  ? Drink ONE (1) Pre-Surgery Clear Ensure or G2 by am the morning of surgery. Drink in one sitting. Do not sip.  ? This drink was given to you during your hospital  pre-op appointment visit. ? Nothing else to drink after completing the  Pre-Surgery Clear Ensure or G2.          If you have questions, please contact your surgeon's office.     Oral Hygiene is also important to reduce your risk of infection.                                    Remember -  BRUSH YOUR TEETH  THE MORNING OF SURGERY WITH YOUR REGULAR TOOTHPASTE   Do NOT smoke after Midnight   Take these medicines the morning of surgery with A SIP OF WATER:  Gabapentin, Metoprolol                                You may not have any metal on your body including hair pins, jewelry, and body piercings             Do not wear make-up, lotions, powders, perfumes/cologne, or deodorant             Do not wear nail polish.  Do not shave  48 hours prior to surgery.    Do not bring valuables to the hospital. [place].   Contacts, dentures or bridgework may not be worn into surgery.   Bring small overnight bag day of surgery.   Patients discharged the day of surgery will not be allowed to drive home.               Please read over the following fact sheets you were given: IF YOU HAVE QUESTIONS ABOUT YOUR PRE OP INSTRUCTIONS PLEASE CALL  [place] - Preparing for Surgery Before surgery, you can play an important role.  Because skin is not sterile, your skin needs to be as free of germs as possible.  You can reduce the number of germs on your skin by washing with CHG (chlorahexidine gluconate) soap before surgery.  CHG is an antiseptic cleaner which kills germs and bonds with the skin to continue killing germs even after washing. Please DO NOT use if you have an allergy to CHG or antibacterial soaps.  If your skin becomes reddened/irritated stop using the CHG and inform your nurse when you arrive at Short Stay. Do not shave (including legs and underarms) for at least 48 hours prior to the first CHG shower.  You may shave your face/neck.  Please follow these instructions carefully:  1.  Shower with CHG Soap the night before surgery and the  morning of surgery.  2.  If you choose to wash your hair, wash your hair first as usual with your normal  shampoo.  3.  After you shampoo, rinse your hair and body thoroughly to remove the shampoo.                              4.  Use CHG as you would any other liquid soap.  You can apply chg directly to the skin and wash.  Gently with a scrungie or clean washcloth.  5.  Apply the CHG Soap to your body ONLY FROM THE NECK DOWN.   Do   not use on face/ open                           Wound or open sores. Avoid contact with eyes, ears mouth and   genitals (private parts).                       Wash face,  Genitals (private parts) with your normal soap.             6.  Wash thoroughly, paying special attention to the area where your    surgery  will be performed.  7.  Thoroughly rinse your body with warm water from the neck down.  8.  DO NOT shower/wash with your normal soap after using and rinsing off the CHG Soap.                9.  Pat yourself dry with a clean towel.            10.  Wear clean pajamas.            11.  Place clean sheets on your bed the night of your first shower and do not  sleep with pets. Day of Surgery : Do not apply any lotions/deodorants the morning of surgery.  Please wear clean clothes to the hospital/surgery center.  FAILURE TO FOLLOW THESE INSTRUCTIONS MAY RESULT IN THE CANCELLATION OF YOUR SURGERY  PATIENT SIGNATURE_________________________________  NURSE SIGNATURE__________________________________  ________________________________________________________________________   Lindsay Clark  An incentive spirometer is a tool that can help keep your lungs clear and active. This tool measures how well you are filling your lungs with each breath. Taking long deep breaths may help reverse or decrease the chance of developing breathing (pulmonary) problems (especially infection) following:  A long period of time when you are unable to move or be active. BEFORE THE PROCEDURE   If the spirometer includes an indicator to show your best effort, your nurse or respiratory therapist will set it to a desired goal.  If possible, sit up straight or lean slightly  forward. Try not to slouch.  Hold the incentive spirometer in an upright position. INSTRUCTIONS FOR USE  1. Sit on the edge of your bed if possible, or sit up as far as you can in bed or on a chair. 2. Hold the incentive spirometer in an upright position. 3. Breathe out normally. 4. Place the mouthpiece in your mouth and seal your lips tightly around it. 5. Breathe in slowly and as deeply as possible, raising the piston or the ball toward the top of the column. 6. Hold your breath for 3-5 seconds or for as long as possible. Allow the piston or ball to fall to the bottom of the column. 7. Remove the mouthpiece from your mouth and breathe out normally. 8. Rest for a few seconds and repeat Steps 1 through 7 at least 10 times every 1-2 hours when you are awake. Take your time and take a few normal breaths between deep breaths. 9. The spirometer may include an indicator to show your best effort. Use the indicator as a goal to work toward during each repetition. 10. After each set of 10 deep breaths, practice coughing to be sure your lungs are clear. If you have an incision (the cut made at the time of surgery), support your incision when coughing by placing a pillow or rolled up towels firmly against it. Once you are able to get out of bed, walk around indoors and cough well. You may stop using the incentive spirometer when instructed by your caregiver.  RISKS AND COMPLICATIONS  Take your time so you do not get dizzy or light-headed.  If you are in pain, you may need to take or ask for pain medication before doing incentive spirometry. It is harder to take a deep breath if you are having pain. AFTER USE  Rest and breathe slowly and easily.  It can be helpful to keep track of a log of  your progress. Your caregiver can provide you with a simple table to help with this. If you are using the spirometer at home, follow these instructions: [place] IF:   You are having difficultly using the  spirometer.  You have trouble using the spirometer as often as instructed.  Your pain medication is not giving enough relief while using the spirometer.  You develop fever of 100.5 F (38.1 C) or higher. SEEK IMMEDIATE MEDICAL CARE IF:   You cough up bloody sputum that had not been present before.  You develop fever of 102 F (38.9 C) or greater.  You develop worsening pain at or near the incision site. MAKE SURE YOU:   Understand these instructions.  Will watch your condition.  Will get help right away if you are not doing well or get worse. Document Released: 08/20/2006 Document Revised: 07/02/2011 Document Reviewed: 10/21/2006 [facility] Patient Information 2014 [place], [place].   ________________________________________________________________________

## 2020-07-25 NOTE — Telephone Encounter (Signed)
I called the patient to get a different fax number because the one she gave me keep saying fax is busy. She gave me the office number for Dr. Willis Modena to call for a different fax number.   The fax number is [phone].

## 2020-07-26 DIAGNOSIS — I4811 Longstanding persistent atrial fibrillation: Secondary | ICD-10-CM | POA: Diagnosis not present

## 2020-07-27 DIAGNOSIS — M25562 Pain in left knee: Secondary | ICD-10-CM | POA: Diagnosis not present

## 2020-07-29 ENCOUNTER — Encounter (HOSPITAL_COMMUNITY)
Admission: RE | Admit: 2020-07-29 | Discharge: 2020-07-29 | Disposition: A | Discharge status: Home / Self Care | Payer: Medicare PPO | Source: Ambulatory Visit | Attending: Orthopaedic Surgery | Admitting: Orthopaedic Surgery

## 2020-07-29 ENCOUNTER — Other Ambulatory Visit: Payer: Self-pay

## 2020-07-29 ENCOUNTER — Encounter (HOSPITAL_COMMUNITY): Payer: Self-pay

## 2020-07-29 DIAGNOSIS — Z01812 Encounter for preprocedural laboratory examination: Secondary | ICD-10-CM | POA: Diagnosis not present

## 2020-07-29 HISTORY — DX: Other specified postprocedural states: Z98.890

## 2020-07-29 HISTORY — DX: Other encephalitis and encephalomyelitis: G04.81

## 2020-07-29 HISTORY — DX: Other specified postprocedural states: R11.2

## 2020-07-29 LAB — CBC
HCT: 45.5 % (ref 36.0–46.0)
Hemoglobin: 14.7 g/dL (ref 12.0–15.0)
MCH: 30.6 pg (ref 26.0–34.0)
MCHC: 32.3 g/dL (ref 30.0–36.0)
MCV: 94.8 fL (ref 80.0–100.0)
Platelets: 200 10*3/uL (ref 150–400)
RBC: 4.8 MIL/uL (ref 3.87–5.11)
RDW: 13 % (ref 11.5–15.5)
WBC: 6 10*3/uL (ref 4.0–10.5)
nRBC: 0 % (ref 0.0–0.2)

## 2020-07-29 LAB — BASIC METABOLIC PANEL
Anion gap: 9 (ref 5–15)
BUN: 20 mg/dL (ref 8–23)
CO2: 30 mmol/L (ref 22–32)
Calcium: 8.9 mg/dL (ref 8.9–10.3)
Chloride: 102 mmol/L (ref 98–111)
Creatinine, Ser: 0.69 mg/dL (ref 0.44–1.00)
GFR, Estimated: >60 mL/min (ref >60)
Glucose, Bld: 124 mg/dL — ABNORMAL HIGH (ref 70–99)
Potassium: 4.2 mmol/L (ref 3.5–5.1)
Sodium: 141 mmol/L (ref 135–145)

## 2020-08-01 ENCOUNTER — Other Ambulatory Visit: Payer: Self-pay

## 2020-08-01 ENCOUNTER — Ambulatory Visit
Admission: RE | Admit: 2020-08-01 | Discharge: 2020-08-01 | Disposition: A | Discharge status: Home / Self Care | Payer: Medicare PPO | Source: Ambulatory Visit | Attending: Family Medicine | Admitting: Family Medicine

## 2020-08-01 DIAGNOSIS — N852 Hypertrophy of uterus: Secondary | ICD-10-CM | POA: Diagnosis not present

## 2020-08-01 DIAGNOSIS — N838 Other noninflammatory disorders of ovary, fallopian tube and broad ligament: Secondary | ICD-10-CM | POA: Diagnosis not present

## 2020-08-01 DIAGNOSIS — R1032 Left lower quadrant pain: Secondary | ICD-10-CM

## 2020-08-01 DIAGNOSIS — M47816 Spondylosis without myelopathy or radiculopathy, lumbar region: Secondary | ICD-10-CM | POA: Diagnosis not present

## 2020-08-01 DIAGNOSIS — K429 Umbilical hernia without obstruction or gangrene: Secondary | ICD-10-CM | POA: Diagnosis not present

## 2020-08-01 MED ORDER — IOPAMIDOL (ISOVUE-300) INJECTION 61%
100.0000 mL | Freq: Once | INTRAVENOUS | Status: AC | PRN
  Administered 2020-08-01: 100 mL via INTRAVENOUS

## 2020-08-01 MED ORDER — METOPROLOL SUCCINATE ER 100 MG PO TB24: 100.0000 mg | ORAL_TABLET | Freq: Two times a day (BID) | ORAL | 3 refills | Status: DC

## 2020-08-01 NOTE — Anesthesia Preprocedure Evaluation (Addendum)
Anesthesia Evaluation  Patient identified by MRN, date of birth, ID band  Reviewed: Allergy & Precautions, NPO status , Patient's Chart, lab work & pertinent test results  History of Anesthesia Complications (+) PONV and history of anesthetic complications  Airway Mallampati: II  TM Distance: >3 FB Neck ROM: Full    Dental no notable dental hx.    Pulmonary neg pulmonary ROS,    Pulmonary exam normal breath sounds clear to auscultation       Cardiovascular hypertension, + CAD  + dysrhythmias (on eliquis) Atrial Fibrillation  Rhythm:Irregular Rate:Abnormal  Afib, rate 90   Neuro/Psych Anxiety negative neurological ROS     GI/Hepatic negative GI ROS, Neg liver ROS,   Endo/Other  negative endocrine ROS  Renal/GU negative Renal ROS  negative genitourinary   Musculoskeletal  (+) Arthritis , Osteoarthritis,    Abdominal   Peds negative pediatric ROS (+)  Hematology negative hematology ROS (+)   Anesthesia Other Findings   Reproductive/Obstetrics negative OB ROS                           Anesthesia Physical Anesthesia Plan  ASA: III  Anesthesia Plan: MAC and Regional   Post-op Pain Management:  Regional for Post-op pain   Induction: Intravenous  PONV Risk Score and Plan: 3 and Propofol infusion, TIVA, Treatment may vary due to age or medical condition and Ondansetron  Airway Management Planned: Natural Airway and Simple Face Mask  Additional Equipment:   Intra-op Plan:   Post-operative Plan:   Informed Consent: I have reviewed the patients History and Physical, chart, labs and discussed the procedure including the risks, benefits and alternatives for the proposed anesthesia with the patient or authorized representative who has indicated his/her understanding and acceptance.     Dental advisory given  Plan Discussed with: Anesthesiologist, CRNA and Surgeon  Anesthesia Plan  Comments: (Adductor canal block + propofol gtt. GA/LMA as backup plan. Norton Blizzard, MD      Per cardiology note 06/29/2020, "Chart reviewed as part of pre-operative protocol coverage. Patient was contacted 06/29/2020 in reference to pre-operative risk assessment for pending surgery as outlined below.  Lindsay Clark was last seen on 05/25/20 by Dr. Rayann Heman.  Since that day, Lindsay Clark has done well.  She has a history of nonobstructive CAD. She can complete 4.0 METS without angina.   Per our clinical pharmacist: Patient with diagnosis ofafibon Eliquisfor anticoagulation.   Procedure:left knee scope Date of procedure:TBD  CHA2DS2-VAScScore = 5 This indicates a7.2% annual risk of stroke. The patient's score is based upon: CHF History: No HTN History: Yes Diabetes History: No Stroke History: No Vascular Disease History: Yes Age Score: 2 Gender Score: 1  CrCl66m/min using adjusted body weight Platelet count217K  Per office protocol, patient can holdEliquisfor 2days prior to procedure.  Therefore, based on ACC/AHA guidelines, the patient would be at acceptable risk for the planned procedure without further cardiovascular testing. ")      Anesthesia Quick Evaluation

## 2020-08-02 ENCOUNTER — Ambulatory Visit: Payer: Medicare PPO | Admitting: Internal Medicine

## 2020-08-02 ENCOUNTER — Encounter: Payer: Self-pay | Admitting: Internal Medicine

## 2020-08-02 VITALS — BP 114/72 | HR 74 | Ht 64.0 in | Wt 215.0 lb

## 2020-08-02 DIAGNOSIS — R142 Eructation: Secondary | ICD-10-CM

## 2020-08-02 DIAGNOSIS — R151 Fecal smearing: Secondary | ICD-10-CM | POA: Diagnosis not present

## 2020-08-02 DIAGNOSIS — R143 Flatulence: Secondary | ICD-10-CM | POA: Diagnosis not present

## 2020-08-02 DIAGNOSIS — R1032 Left lower quadrant pain: Secondary | ICD-10-CM

## 2020-08-02 DIAGNOSIS — R141 Gas pain: Secondary | ICD-10-CM | POA: Diagnosis not present

## 2020-08-02 DIAGNOSIS — R1013 Epigastric pain: Secondary | ICD-10-CM

## 2020-08-02 DIAGNOSIS — L304 Erythema intertrigo: Secondary | ICD-10-CM | POA: Diagnosis not present

## 2020-08-02 NOTE — Progress Notes (Signed)
Lindsay Clark 80 y.o. Aug 25, 1940 [id number]  Assessment & Plan:   Encounter Diagnoses  Name Primary?  . Fecal smearing Yes  . Flatulence, eructation and gas pain   . Left groin pain   . Dyspepsia   . Intertrigo    Await results of CT scan.  I will call her with those.  Unless that leads [place] in a different direction I would anticipate lactulose hydrogen breath test for small intestinal bacterial overgrowth, fecal calprotectin and fecal elastase testing.  She has intertrigo and Candida issues.  At least that is my clinical impression and I have recommended an over-the-counter antifungal cream such as a jock itch cream to be used on these areas affected.  She may wait to review with her dermatologist.  Reviewed the note from [facility] dermatology on 03/07/2020 and she was recommended to use Mycostatin cream to affected areas as well as a Nizoral shampoo.  She had indicated to me she prefers pills and internal medicine to treat things rather than topical, it looks like she had that conversation with the dermatologist who recommended avoiding fluconazole in conjunction with hydroxychloroquine which can increase risk of QT prolongation.  She is concerned about her elderly mother having history of colon cancer as well as the fact that she does have some symptoms that are gastrointestinal in origin and is interested in pursuing another colonoscopy.  In the past I had suggested that she was not increased risk and I did not think that was necessary.  It may be reasonable to do one more time in her.  I think again lets see what the CT scan shows.  As well regarding her dyspepsia perhaps an EGD is appropriate.  Have to factor in knee surgery that is coming as well as upcoming cardiac ablation and need for anticoagulation and holding etc.   CT scan showed dilated ovarian veins on the left with a question of pelvic congestion syndrome.  I did call her and reviewed.  I do not think this is the likely cause  of her groin pain though it could be.  I think a gynecologic evaluation is reasonable and suggested she pursue that.  She is aware and actually agrees that this pelvic congestion syndrome possibility is an unlikely cause of this intermittent left lower quadrant/groin pain.  That said I do think up gynecologic evaluation would be useful as part of the work-up.   IMPRESSION: 1. Enlarged left ovarian vein and enlarged venous structures along the left adnexa and left side of the uterus. 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 No other imaging findings to explain left groin pain. 2. No acute abnormality in the abdomen or pelvis. 3.  Aortic Atherosclerosis (ICD10-I70.0). 4. Significant degenerative changes in lumbar spine.   We will have her come in for the fecal calprotectin and fecal elastase test and she will pick up a kit for lactulose hydrogen breath test for SIBO.  I will message her as well another thought is that the L3-4 L4-5 and L5-S1 disc space narrowing could potentially be related to some of this pain she has as well.  I appreciate the opportunity to care for this patient. CC: [name], MD   Subjective:   Chief Complaint: Fecal leakage change in bowel habits abdominal pain  HPI Lindsay Clark is a 80 year old woman who has had chronic recurrent gas problems with seepage of gas, also some fecal smearing and  difficult cleansing in whom I banded hemorrhoids but that did not fix her problems.  She is here with persistent issues as well as a left lower quadrant/groin pain that comes and goes and can be excruciating but is usually short-lived.  She feels like her bowel habits have changed and that the consistency is somewhat looser at times.  She continues to have is embarrassing leakage of flatus.  She has had some sort of a yeast problem on her skin and is seeing dermatology in [facility] and is on some  type of a shampoo.  She had her husband check her today and she reports that he found perianal erythema and some sort of a discharge and "odor".  She also has erythema and issues in the groin.  She is concerned about her family history of colon cancer in an elderly mother and notes that I told her that I did not think that impacted her and that her previous gastroenterologist Dr. Wynetta Emery did the same.  She is concerned and in light of gastrointestinal symptoms is interested in pursuing a colonoscopy if I think appropriate.  She also has concerns about her stomach not being quite right.  Yesterday she had a CT scan and she is quite irritated that it has not been read out yet.  This is because of the recurrent left groin and lower quadrant pain that she has been having.  Dr. Dema Severin, her primary care provider ordered that.  She notes that it was "like an ocean in there" and points to her stomach after drinking the contrast for the CT.  She means that things were sloshing around.  She has some postprandial upper abdominal distress that is poorly characterized at times.  There is no dysphagia.  Wt Readings from Last 3 Encounters:  08/02/20 215 lb (97.5 kg)  07/29/20 215 lb 12.8 oz (97.9 kg)  07/15/20 214 lb (97.1 kg)    Allergies  Allergen Reactions  . Diltiazem Swelling    Bilateral feet  . Methotrexate     Other reaction(s): swelling in ankles  . Metronidazole     Other reaction(s): leg swelling  . Other Other (See Comments)    Medication:Methychloroisothlazolinone/methylisothiazilinoneCL +Me-Isothiazolinone Reaction: unknown-patch testing Medication:glyceryl thioglycolate  Reaction:unknown-patch testing  . Quaternium-15 Other (See Comments)    unknown reaction-patching testing   Current Meds  Medication Sig  . acetaminophen (TYLENOL) 500 MG tablet Take 1,000 mg by mouth daily as needed (pain).  Marland Kitchen ELIQUIS 5 MG TABS tablet TAKE 1 TABLET BY MOUTH TWICE DAILY. (Patient taking differently: Take  5 mg by mouth 2 (two) times daily.)  . furosemide (LASIX) 40 MG tablet Take 2 tablets (80 mg total) by mouth daily. (Patient taking differently: Take 80 mg by mouth in the morning.)  . gabapentin (NEURONTIN) 300 MG capsule TAKE 1 CAP IN THE EVENING X3DAYS,IF TOLERATED CAN TAKE 1 CAP 2XDAY,CAN GO UP TO 3XDAY IF NEEDED (Patient taking differently: Take 300 mg by mouth 3 (three) times daily.)  . hydroxychloroquine (PLAQUENIL) 200 MG tablet Take 200 mg by mouth 2 (two) times daily.  Marland Kitchen ketoconazole (NIZORAL) 2 % shampoo Apply 1 application topically daily as needed (skin irritation (body wash)).  . metoprolol succinate (TOPROL-XL) 100 MG 24 hr tablet Take 1 tablet (100 mg total) by mouth 2 (two) times daily.  . Multiple Vitamin (MULTIVITAMIN WITH MINERALS) TABS tablet Take 1 tablet by mouth in the morning. One-A-Day for Women 50+  . potassium chloride (KLOR-CON) 10 MEQ tablet Take 20 mEq  by mouth daily with breakfast.  . Propylene Glycol 0.6 % SOLN Place 1 drop into both eyes at bedtime.   Past Medical History:  Diagnosis Date  . Atherosclerosis    mild cerebral  . Atrial fibrillation and flutter ([place])   . 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 Autoimmune encephalitis   . Cervical disc disease    with spondylosis  . Cervical spondylosis   . Chronic dermatitis    eczematous  . Colitis   . DDD (degenerative disc disease), lumbar   . Frozen shoulder    left  . Hyperlipidemia   . Hypertension   . Lateral epicondylitis   . Lumbar spondylosis   . Movement disorder   . Multiple lesions on CT of brain and spine    reports lesions from previous CT scan at [facility]   . 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 PONV (postoperative nausea and vomiting)   . Torn meniscus    left   Past Surgical History:  Procedure Laterality Date  . CATARACT EXTRACTION    . COLONOSCOPY    . EYE SURGERY Left   . HEMORRHOID BANDING    . LOOP RECORDER INSERTION N/A 07/05/2017   Procedure: LOOP RECORDER INSERTION;  Surgeon:  Thompson Grayer, MD;  Location: [facility];  Service: Cardiovascular;  Laterality: N/A;  . TUBAL LIGATION  1980  . WISDOM TOOTH EXTRACTION     Social History   Social History Narrative   Patient lives at home with her spouse.   Caffeine Use: 2-3 cups daily   family history includes Cancer in her maternal grandmother; Colon cancer (age of onset: 21) in her mother; Emphysema in her father; Heart attack (age of onset: 72) in her father; Stroke in her mother; Thrombosis in her maternal grandfather; Thyroid disease in her maternal grandmother and son.   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   Perianal skin, she has this erythematous lesion in the coccygeal area and a violaceous discoloration in the gluteal crease and the first picture the second picture is an example of intertrigo in the groin near the genitals    45 minutes total time on this visit before during and after the visit

## 2020-08-02 NOTE — Patient Instructions (Signed)
Dr Carlean Purl is going to review your CT scan and call you with results.   Please try an over the counter jock itch cream for your irritated areas.   I appreciate the opportunity to care for you. Silvano Rusk, MD, [facility]

## 2020-08-03 ENCOUNTER — Other Ambulatory Visit: Payer: Self-pay | Admitting: Internal Medicine

## 2020-08-03 ENCOUNTER — Telehealth: Payer: Self-pay

## 2020-08-03 DIAGNOSIS — R195 Other fecal abnormalities: Secondary | ICD-10-CM

## 2020-08-03 DIAGNOSIS — R142 Eructation: Secondary | ICD-10-CM

## 2020-08-03 DIAGNOSIS — R141 Gas pain: Secondary | ICD-10-CM

## 2020-08-03 NOTE — Telephone Encounter (Signed)
I left Sonnet a detailed message to call me back and that I have a SIBO kit for her to come get and also to go to the lab to get stool kits.

## 2020-08-03 NOTE — Telephone Encounter (Signed)
-----   Message from Gatha Mayer, MD sent at 08/03/2020  1:20 PM EDT ----- Regarding: SIBO test She needs to pick up Lactulose hydrogen SIBO breath testkit - I have told heryou would call herand tell her when and how  I have ordered stool tests (calprotectin and elastase) and she knows to pick up collection devices when she comes but will not hurt to remind her

## 2020-08-04 ENCOUNTER — Other Ambulatory Visit: Payer: Medicare PPO

## 2020-08-04 DIAGNOSIS — H52203 Unspecified astigmatism, bilateral: Secondary | ICD-10-CM | POA: Diagnosis not present

## 2020-08-04 DIAGNOSIS — H524 Presbyopia: Secondary | ICD-10-CM | POA: Diagnosis not present

## 2020-08-04 DIAGNOSIS — Z961 Presence of intraocular lens: Secondary | ICD-10-CM | POA: Diagnosis not present

## 2020-08-04 DIAGNOSIS — H2512 Age-related nuclear cataract, left eye: Secondary | ICD-10-CM | POA: Diagnosis not present

## 2020-08-04 NOTE — H&P (Signed)
Lindsay Clark is an 80 y.o. female.   Chief Complaint: Left knee pain HPI: Lindsay Clark persists with left knee pain.  She has now been through an MRI scan.  She is here with her daughter to discuss management.  She has pain on the outside aspect.  She has trouble walking very far.  She says she recognizes pain in both of her knees related to arthritis but this is something different.  She has trouble resting at night.   MRI:  I reviewed an MRI scan films and report of a study done at [facility] on 06/03/20.  She has anterior and posterior horn tears of the lateral meniscus along with some advanced degenerative change focally in the lateral compartment.  Past Medical History:  Diagnosis Date  . Atherosclerosis    mild cerebral  . Atrial fibrillation and flutter ([place])   . Autoimmune disorder ([place])   . Autoimmune encephalitis   . Cervical disc disease    with spondylosis  . Cervical spondylosis   . Chronic dermatitis    eczematous  . Colitis   . DDD (degenerative disc disease), lumbar   . Frozen shoulder    left  . Hyperlipidemia   . Hypertension   . Lateral epicondylitis   . Lumbar spondylosis   . Movement disorder   . Multiple lesions on CT of brain and spine    reports lesions from previous CT scan at [facility]   . 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 PONV (postoperative nausea and vomiting)   . Torn meniscus    left    Past Surgical History:  Procedure Laterality Date  . CATARACT EXTRACTION    . COLONOSCOPY    . EYE SURGERY Left   . HEMORRHOID BANDING    . LOOP RECORDER INSERTION N/A 07/05/2017   Procedure: LOOP RECORDER INSERTION;  Surgeon: Thompson Grayer, MD;  Location: [place] [facility];  Service: Cardiovascular;  Laterality: N/A;  . TUBAL LIGATION  1980  . WISDOM TOOTH EXTRACTION      Family History  Problem Relation Age of Onset  . Stroke Mother   . Colon cancer Mother 23  . Heart attack Father 69  . Emphysema Father   . Thyroid disease Son   . Thyroid disease  Maternal Grandmother   . Cancer Maternal Grandmother        cervical   . Thrombosis Maternal Grandfather    Social History:  reports that she has never smoked. She has never used smokeless tobacco. She reports current alcohol use of about 4.0 standard drinks of alcohol per week. She reports that she does not use drugs.  Allergies:  Allergies  Allergen Reactions  . Diltiazem Swelling    Bilateral feet  . Methotrexate     Other reaction(s): swelling in ankles  . Metronidazole     Other reaction(s): leg swelling  . Other Other (See Comments)    Medication:Methychloroisothlazolinone/methylisothiazilinoneCL +Me-Isothiazolinone Reaction: unknown-patch testing Medication:glyceryl thioglycolate  Reaction:unknown-patch testing  . Quaternium-15 Other (See Comments)    unknown reaction-patching testing    No medications prior to admission.    No results found for this or any previous visit (from the past 48 hour(s)). No results found.  Review of Systems  Musculoskeletal: Positive for arthralgias.       Left knee  All other systems reviewed and are negative.   There were no vitals taken for this visit. Physical Exam Constitutional:      Appearance: Normal appearance.  HENT:     Head: Normocephalic and atraumatic.     Nose: Nose normal.     Mouth/Throat:     Pharynx: Oropharynx is clear.  Eyes:     Extraocular Movements: Extraocular movements intact.  Cardiovascular:     Rate and Rhythm: Normal rate.  Pulmonary:     Effort: Pulmonary effort is normal.  Abdominal:     Palpations: Abdomen is soft.  Musculoskeletal:     Cervical back: Normal range of motion.     Comments: Left knee exam is the same with 1+ effusion and terrible lateral joint line pain.  She has a mild valgus deformity.   Skin:    General: Skin is warm and dry.  Neurological:     General: No focal deficit present.     Mental Status: She is alert and oriented to person, place, and time.  Psychiatric:         Mood and Affect: Mood normal.        Behavior: Behavior normal.        Thought Content: Thought content normal.        Judgment: Judgment normal.      Assessment/Plan Assessment:  Left knee torn meniscus by MRI 2022 injected 2020  Plan: Lindsay Clark has a mixture of torn and worn cartilage in the knee.  I do not think she would be a very good candidate for knee replacement operation but I think she will probably tolerate a knee arthroscopy well.  I stressed that this would make her better but not new and she and her daughter seem to understand that well.  I reviewed risk of anesthesia, infection, DVT.  Larwance Sachs Sahaj Bona, PA-C 08/04/2020, 10:20 AM

## 2020-08-04 NOTE — Telephone Encounter (Signed)
Lindsay Clark came by and we went over the SIBO kit instructions.  

## 2020-08-05 ENCOUNTER — Other Ambulatory Visit (HOSPITAL_COMMUNITY): Payer: Medicare PPO

## 2020-08-08 ENCOUNTER — Other Ambulatory Visit (HOSPITAL_COMMUNITY)
Admission: RE | Admit: 2020-08-08 | Discharge: 2020-08-08 | Disposition: A | Discharge status: Home / Self Care | Payer: Medicare PPO | Source: Ambulatory Visit | Attending: Orthopaedic Surgery | Admitting: Orthopaedic Surgery

## 2020-08-08 ENCOUNTER — Ambulatory Visit (INDEPENDENT_AMBULATORY_CARE_PROVIDER_SITE_OTHER): Payer: Medicare PPO

## 2020-08-08 DIAGNOSIS — I4819 Other persistent atrial fibrillation: Secondary | ICD-10-CM

## 2020-08-08 DIAGNOSIS — Z01812 Encounter for preprocedural laboratory examination: Secondary | ICD-10-CM | POA: Diagnosis not present

## 2020-08-08 DIAGNOSIS — Z20822 Contact with and (suspected) exposure to covid-19: Secondary | ICD-10-CM | POA: Diagnosis not present

## 2020-08-08 LAB — SARS CORONAVIRUS 2 (TAT 6-24 HRS): SARS Coronavirus 2: NEGATIVE

## 2020-08-09 ENCOUNTER — Encounter (HOSPITAL_COMMUNITY)
Admission: RE | Disposition: A | Discharge status: Home / Self Care | Payer: Self-pay | Source: Other Acute Inpatient Hospital | Attending: Orthopaedic Surgery

## 2020-08-09 ENCOUNTER — Ambulatory Visit (HOSPITAL_COMMUNITY): Payer: Medicare PPO | Admitting: Physician Assistant

## 2020-08-09 ENCOUNTER — Ambulatory Visit (HOSPITAL_COMMUNITY)
Admission: RE | Admit: 2020-08-09 | Discharge: 2020-08-09 | Disposition: A | Discharge status: Home / Self Care | Payer: Medicare PPO | Source: Other Acute Inpatient Hospital | Attending: Orthopaedic Surgery | Admitting: Orthopaedic Surgery

## 2020-08-09 ENCOUNTER — Ambulatory Visit (HOSPITAL_COMMUNITY): Payer: Medicare PPO | Admitting: Anesthesiology

## 2020-08-09 ENCOUNTER — Encounter (HOSPITAL_COMMUNITY): Payer: Self-pay | Admitting: Orthopaedic Surgery

## 2020-08-09 ENCOUNTER — Other Ambulatory Visit: Payer: Self-pay

## 2020-08-09 DIAGNOSIS — M25562 Pain in left knee: Secondary | ICD-10-CM | POA: Diagnosis present

## 2020-08-09 DIAGNOSIS — Z888 Allergy status to other drugs, medicaments and biological substances status: Secondary | ICD-10-CM | POA: Diagnosis not present

## 2020-08-09 DIAGNOSIS — S83242A Other tear of medial meniscus, current injury, left knee, initial encounter: Secondary | ICD-10-CM | POA: Diagnosis not present

## 2020-08-09 DIAGNOSIS — M23304 Other meniscus derangements, unspecified medial meniscus, left knee: Secondary | ICD-10-CM | POA: Diagnosis not present

## 2020-08-09 DIAGNOSIS — M1712 Unilateral primary osteoarthritis, left knee: Secondary | ICD-10-CM | POA: Insufficient documentation

## 2020-08-09 DIAGNOSIS — X58XXXA Exposure to other specified factors, initial encounter: Secondary | ICD-10-CM | POA: Insufficient documentation

## 2020-08-09 DIAGNOSIS — S83282A Other tear of lateral meniscus, current injury, left knee, initial encounter: Secondary | ICD-10-CM | POA: Diagnosis not present

## 2020-08-09 DIAGNOSIS — I4819 Other persistent atrial fibrillation: Secondary | ICD-10-CM | POA: Diagnosis not present

## 2020-08-09 DIAGNOSIS — I10 Essential (primary) hypertension: Secondary | ICD-10-CM | POA: Diagnosis not present

## 2020-08-09 DIAGNOSIS — M23301 Other meniscus derangements, unspecified lateral meniscus, left knee: Secondary | ICD-10-CM | POA: Diagnosis not present

## 2020-08-09 HISTORY — PX: KNEE ARTHROSCOPY: SHX127

## 2020-08-09 SURGERY — ARTHROSCOPY, KNEE
Anesthesia: Monitor Anesthesia Care | Site: Knee | Laterality: Left

## 2020-08-09 MED ORDER — EPINEPHRINE PF 1 MG/ML IJ SOLN
INTRAMUSCULAR | Status: AC
  Filled 2020-08-09: qty 1

## 2020-08-09 MED ORDER — DEXAMETHASONE SODIUM PHOSPHATE 10 MG/ML IJ SOLN
INTRAMUSCULAR | Status: AC
  Filled 2020-08-09: qty 1

## 2020-08-09 MED ORDER — ORAL CARE MOUTH RINSE: 15.0000 mL | Freq: Once | OROMUCOSAL | Status: AC

## 2020-08-09 MED ORDER — METHYLPREDNISOLONE ACETATE 40 MG/ML IJ SUSP
INTRAMUSCULAR | Status: AC
  Filled 2020-08-09: qty 2

## 2020-08-09 MED ORDER — OXYCODONE HCL 5 MG/5ML PO SOLN: 5.0000 mg | Freq: Once | ORAL | Status: AC | PRN

## 2020-08-09 MED ORDER — LIDOCAINE HCL 1 % IJ SOLN
INTRAMUSCULAR | Status: DC | PRN
  Administered 2020-08-09: 40 mg via INTRADERMAL

## 2020-08-09 MED ORDER — ONDANSETRON HCL 4 MG/2ML IJ SOLN
INTRAMUSCULAR | Status: AC
  Filled 2020-08-09: qty 2

## 2020-08-09 MED ORDER — EPINEPHRINE (ANAPHYLAXIS) 1 MG/ML IJ SOLN
INTRAMUSCULAR | Status: DC | PRN
  Administered 2020-08-09: 1 mg

## 2020-08-09 MED ORDER — FENTANYL CITRATE (PF) 100 MCG/2ML IJ SOLN
25.0000 ug | INTRAMUSCULAR | Status: DC | PRN
  Administered 2020-08-09: 25 ug via INTRAVENOUS

## 2020-08-09 MED ORDER — ACETAMINOPHEN 500 MG PO TABS
1000.0000 mg | ORAL_TABLET | Freq: Once | ORAL | Status: AC
Start: 2020-08-09 — End: 2020-08-09
  Administered 2020-08-09: 1000 mg via ORAL
  Filled 2020-08-09: qty 2

## 2020-08-09 MED ORDER — BUPIVACAINE-EPINEPHRINE 0.5% -1:200000 IJ SOLN
INTRAMUSCULAR | Status: DC | PRN
  Administered 2020-08-09 (×2): 10 mL

## 2020-08-09 MED ORDER — LACTATED RINGERS IV SOLN: INTRAVENOUS | Status: DC

## 2020-08-09 MED ORDER — LIDOCAINE 2% (20 MG/ML) 5 ML SYRINGE
INTRAMUSCULAR | Status: AC
  Filled 2020-08-09: qty 5

## 2020-08-09 MED ORDER — MIDAZOLAM HCL 2 MG/2ML IJ SOLN
1.0000 mg | INTRAMUSCULAR | Status: DC
  Administered 2020-08-09: 1 mg via INTRAVENOUS
  Filled 2020-08-09: qty 2

## 2020-08-09 MED ORDER — BUPIVACAINE-EPINEPHRINE 0.5% -1:200000 IJ SOLN
INTRAMUSCULAR | Status: AC
  Filled 2020-08-09: qty 1

## 2020-08-09 MED ORDER — AMISULPRIDE (ANTIEMETIC) 5 MG/2ML IV SOLN: 10.0000 mg | Freq: Once | INTRAVENOUS | Status: DC | PRN

## 2020-08-09 MED ORDER — OXYCODONE HCL 5 MG PO TABS
5.0000 mg | ORAL_TABLET | Freq: Once | ORAL | Status: AC | PRN
  Administered 2020-08-09: 5 mg via ORAL

## 2020-08-09 MED ORDER — PROPOFOL 500 MG/50ML IV EMUL
INTRAVENOUS | Status: DC | PRN
  Administered 2020-08-09: 50 ug/kg/min via INTRAVENOUS

## 2020-08-09 MED ORDER — BUPIVACAINE HCL (PF) 0.5 % IJ SOLN
INTRAMUSCULAR | Status: DC | PRN
  Administered 2020-08-09: 20 mL via PERINEURAL

## 2020-08-09 MED ORDER — CEFAZOLIN SODIUM-DEXTROSE 2-4 GM/100ML-% IV SOLN
2.0000 g | INTRAVENOUS | Status: AC
  Administered 2020-08-09: 2 g via INTRAVENOUS
  Filled 2020-08-09: qty 100

## 2020-08-09 MED ORDER — SODIUM CHLORIDE 0.9 % IR SOLN
Status: DC | PRN
  Administered 2020-08-09: 3000 mL

## 2020-08-09 MED ORDER — OXYCODONE HCL 5 MG PO TABS
ORAL_TABLET | ORAL | Status: AC
  Filled 2020-08-09: qty 1

## 2020-08-09 MED ORDER — PROPOFOL 500 MG/50ML IV EMUL
INTRAVENOUS | Status: AC
  Filled 2020-08-09: qty 50

## 2020-08-09 MED ORDER — METHYLPREDNISOLONE ACETATE 80 MG/ML IJ SUSP
INTRAMUSCULAR | Status: DC | PRN
  Administered 2020-08-09: 80 mg via INTRA_ARTICULAR

## 2020-08-09 MED ORDER — FENTANYL CITRATE (PF) 100 MCG/2ML IJ SOLN
INTRAMUSCULAR | Status: DC | PRN
  Administered 2020-08-09: 25 ug via INTRAVENOUS

## 2020-08-09 MED ORDER — FENTANYL CITRATE (PF) 100 MCG/2ML IJ SOLN
INTRAMUSCULAR | Status: AC
  Filled 2020-08-09: qty 2

## 2020-08-09 MED ORDER — FENTANYL CITRATE (PF) 100 MCG/2ML IJ SOLN
50.0000 ug | INTRAMUSCULAR | Status: DC
  Administered 2020-08-09: 50 ug via INTRAVENOUS
  Filled 2020-08-09: qty 2

## 2020-08-09 MED ORDER — CHLORHEXIDINE GLUCONATE 0.12 % MT SOLN
15.0000 mL | Freq: Once | OROMUCOSAL | Status: AC
  Administered 2020-08-09: 15 mL via OROMUCOSAL

## 2020-08-09 SURGICAL SUPPLY — 32 items
BLADE EXCALIBUR 4.0X13 (MISCELLANEOUS) IMPLANT
BNDG ELASTIC 6X5.8 VLCR STR LF (GAUZE/BANDAGES/DRESSINGS) ×2 IMPLANT
BNDG GAUZE ELAST 4 BULKY (GAUZE/BANDAGES/DRESSINGS) ×2 IMPLANT
COVER SURGICAL LIGHT HANDLE (MISCELLANEOUS) IMPLANT
COVER WAND RF STERILE (DRAPES) ×2 IMPLANT
DISSECTOR 3.5MM X 13CM (MISCELLANEOUS) ×2 IMPLANT
DRAPE ARTHROSCOPY W/POUCH 114 (DRAPES) ×2 IMPLANT
DRAPE SHEET LG 3/4 BI-LAMINATE (DRAPES) ×2 IMPLANT
DRAPE U-SHAPE 47X51 STRL (DRAPES) ×2 IMPLANT
DRSG EMULSION OIL 3X3 NADH (GAUZE/BANDAGES/DRESSINGS) ×2 IMPLANT
DRSG KUZMA FLUFF (GAUZE/BANDAGES/DRESSINGS) ×2 IMPLANT
DRSG PAD ABDOMINAL 8X10 ST (GAUZE/BANDAGES/DRESSINGS) ×2 IMPLANT
DURAPREP 26ML APPLICATOR (WOUND CARE) ×4 IMPLANT
GAUZE SPONGE 4X4 12PLY STRL (GAUZE/BANDAGES/DRESSINGS) ×2 IMPLANT
GLOVE SRG 8 PF TXTR STRL LF DI (GLOVE) ×2 IMPLANT
GLOVE SURG ENC MOIS LTX SZ8 (GLOVE) ×4 IMPLANT
GLOVE SURG UNDER POLY LF SZ8 (GLOVE) ×4
GOWN STRL REUS W/ TWL XL LVL3 (GOWN DISPOSABLE) ×2 IMPLANT
GOWN STRL REUS W/TWL XL LVL3 (GOWN DISPOSABLE) ×4
KIT BASIN OR (CUSTOM PROCEDURE TRAY) ×2 IMPLANT
KIT TURNOVER KIT A (KITS) ×2 IMPLANT
MANIFOLD NEPTUNE II (INSTRUMENTS) ×2 IMPLANT
NEEDLE HYPO 22GX1.5 SAFETY (NEEDLE) ×2 IMPLANT
NEEDLE SPNL 18GX3.5 QUINCKE PK (NEEDLE) IMPLANT
PACK ARTHROSCOPY WL (CUSTOM PROCEDURE TRAY) ×2 IMPLANT
PAD ARMBOARD 7.5X6 YLW CONV (MISCELLANEOUS) ×4 IMPLANT
PENCIL SMOKE EVACUATOR (MISCELLANEOUS) IMPLANT
SYR CONTROL 10ML LL (SYRINGE) ×2 IMPLANT
TOWEL OR 17X26 10 PK STRL BLUE (TOWEL DISPOSABLE) ×2 IMPLANT
TUBING ARTHROSCOPY IRRIG 16FT (MISCELLANEOUS) ×2 IMPLANT
WATER STERILE IRR 1000ML POUR (IV SOLUTION) ×2 IMPLANT
WRAP KNEE MAXI GEL POST OP (GAUZE/BANDAGES/DRESSINGS) ×2 IMPLANT

## 2020-08-09 NOTE — Brief Op Note (Signed)
Lindsay Clark [id number] 08/09/2020   PRE-OP DIAGNOSIS: left TLM and DJD  POST-OP DIAGNOSIS: same  PROCEDURE: left knee scope with PLM and CP  ANESTHESIA: block and local  [name]   Dictation #:  [id number]

## 2020-08-09 NOTE — Progress Notes (Signed)
AssistedDr. Elgie Congo with left, ultrasound guided, adductor canal block. Side rails up, monitors on throughout procedure. See vital signs in flow sheet. Tolerated Procedure well.

## 2020-08-09 NOTE — Interval H&P Note (Signed)
History and Physical Interval Note:  08/09/2020 9:26 AM  Lindsay Clark  has presented today for surgery, with the diagnosis of LEFT KNEE LATERAL MENISCUS TEAR AND DEGENERATIVE JOINT DISEASE.  The various methods of treatment have been discussed with the patient and family. After consideration of risks, benefits and other options for treatment, the patient has consented to  Procedure(s): LEFT KNEE ARTHROSCOPY (Left) as a surgical intervention.  The patient's history has been reviewed, patient examined, no change in status, stable for surgery.  I have reviewed the patient's chart and labs.  Questions were answered to the patient's satisfaction.     [name]

## 2020-08-09 NOTE — Op Note (Signed)
NAMEKOREA, SEVERS MEDICAL RECORD NO: [id number] ACCOUNT NO: [id number] DATE OF BIRTH: [birth year] FACILITY: [facility] LOCATION: [facility] PH[facility]ICIAN: Monico Blitz. Rhona Raider, MD  Operative Report   DATE OF PROCEDURE: 08/09/2020  PREOPERATIVE DIAGNOSES:    1.  Left knee torn lateral meniscus. 2.  Left knee degenerative joint disease.  POSTOPERATIVE DIAGNOSES: 1.  Left knee torn lateral meniscus. 2.  Left knee degenerative joint disease.  PROCEDURES:    1.  Left knee partial lateral and partial medial meniscectomy. 2.  Left knee abrasion chondroplasty lateral and patellofemoral.  ANESTHESIA:  Block and local and sedation.  ATTENDING SURGEON:  Monico Blitz. Rhona Raider, MD  ASSISTANT:  Loni Dolly, PA  INDICATION FOR PROCEDURE:  The patient is a 80 year old woman with a multitude of medical issues who unfortunately has torn and worn cartilage in her left knee.  She has pain, which limits her ability to remain active.  She has failed multiple  conservative measures.  By MRI scan, she does have torn medial and lateral menisci along with some degenerative change fairly focal.  She was offered an arthroscopy in hopes of ameliorating her condition somewhat.  I did not think that she could tolerate  a knee replacement operation.  Informed operative consent was obtained after discussion of possible complications including reaction to anesthesia, infection, DVT.  SUMMARY OF FINDINGS AND PROCEDURE:  Under block and local with some light sedation, we performed a left knee arthroscopy.  Suprapatellar pouch had some grade II and III change with a very focal grade IV change, addressed with chondroplasty to bleeding  bone in one small area.  In the medial compartment, she did have a small tear of the posterior horn of the medial meniscus, addressed with about a 5% partial medial meniscectomy done with a shaver.  She had minimal degenerative change in this  compartment.  ACL looked benign.  Lateral compartment had a  more substantial meniscal tear requiring about a 20% partial lateral meniscectomy.  She did have a dime-sized area of grade IV change on the lateral tibial plateau.  She was scheduled to go home  same day.  DESCRIPTION OF PROCEDURE:  The patient was taken to the operating suite where some local anesthetic was applied to her portal sites along with some sedation.  She was also given a block in the preanesthesia area.  She was positioned supine and prepped  and draped in normal sterile fashion.  After the administration of preoperative IV Kefzol and appropriate timeout, arthroscopy of the left knee was performed through a total of 2 portals.  Findings were as noted above, and procedure consisted most  significantly of the partial lateral meniscectomy done with a shaver taking about 20% of the structure including all of the remaining posterior horn.  She did have some grade IV change and a dime-sized area of the lateral tibial plateau.  In the medial  compartment, she had some fraying of the posterior horn of the medial meniscus, addressed with a tiny partial medial meniscectomy done with a shaver.  She had some grade III change here, which we addressed with chondroplasty, but no bare bone.   Patellofemoral joint was as described above.  The ACL appeared intact.  The knee was thoroughly irrigated followed by removal of arthroscopic equipment.  Adaptic was placed over the portals followed by dry gauze and loose Coban wrap.  Estimated blood  loss and intraoperative fluids can be obtained from anesthesia records.  No tourniquet was used.  DISPOSITION:  The patient  was taken to recovery room in stable condition.  Plans were for her to go home same day and to follow up in the office in less than a week.  I will contact her by phone tonight.   [facility] D: 08/09/2020 11:04:57 am T: 08/09/2020 5:24:00 pm  JOB: [id number]/ [id number]

## 2020-08-09 NOTE — Anesthesia Procedure Notes (Addendum)
Anesthesia Regional Block: Adductor canal block   Pre-Anesthetic Checklist: ,, timeout performed, Correct Patient, Correct Site, Correct Laterality, Correct Procedure, Correct Position, site marked, Risks and benefits discussed,  Surgical consent,  Pre-op evaluation,  At surgeon's request and post-op pain management  Laterality: Left  Prep: chloraprep       Needles:  Injection technique: Single-shot  Needle Type: Echogenic Stimulator Needle          Additional Needles:   Procedures:,,,, ultrasound used (permanent image in chart),,,,  Narrative:  Start time: 08/09/2020 9:25 AM End time: 08/09/2020 9:30 AM Injection made incrementally with aspirations every 5 mL.  Performed by: Personally  Anesthesiologist: Merlinda Frederick, MD  Additional Notes: A functioning IV was confirmed and monitors were applied.  Sterile prep and drape, hand hygiene and sterile gloves were used.  Negative aspiration and test dose prior to incremental administration of local anesthetic. The patient tolerated the procedure well.Ultrasound  guidance: relevant anatomy identified, needle position confirmed, local anesthetic spread visualized around nerve(s), vascular puncture avoided.  Image printed for medical record.

## 2020-08-09 NOTE — Anesthesia Postprocedure Evaluation (Signed)
Anesthesia Post Note  Patient: Lindsay Clark  Procedure(s) Performed: LEFT KNEE ARTHROSCOPY MEDIAL AND LATERAL MENISECTONMY AND CONDROPLASTY (Left Knee)     Patient location during evaluation: PACU Anesthesia Type: Regional Level of consciousness: awake and alert Pain management: pain level controlled Vital Signs Assessment: post-procedure vital signs reviewed and stable Respiratory status: spontaneous breathing, nonlabored ventilation, respiratory function stable and patient connected to nasal cannula oxygen Cardiovascular status: blood pressure returned to baseline and stable Postop Assessment: no apparent nausea or vomiting Anesthetic complications: no   No complications documented.  Last Vitals:  Vitals:   08/09/20 1230 08/09/20 1245  BP: (!) 135/92 (!) 133/94  Pulse: 95 80  Resp: 12 16  Temp:    SpO2: 96% 94%    Last Pain:  Vitals:   08/09/20 1245  TempSrc:   PainSc: 2                  Merlinda Frederick

## 2020-08-09 NOTE — Transfer of Care (Signed)
Immediate Anesthesia Transfer of Care Note  Patient: Lindsay Clark  Procedure(s) Performed: LEFT KNEE ARTHROSCOPY MEDIAL AND LATERAL MENISECTONMY AND CONDROPLASTY (Left Knee)  Patient Location: PACU  Anesthesia Type:MAC  Level of Consciousness: oriented, drowsy and patient cooperative  Airway & Oxygen Therapy: Patient Spontanous Breathing and Patient connected to face mask oxygen  Post-op Assessment: Report given to RN and Post -op Vital signs reviewed and stable  Post vital signs: Reviewed and stable  Last Vitals:  Vitals Value Taken Time  BP 146/109 08/09/20 1106  Temp    Pulse 100 08/09/20 1108  Resp 10 08/09/20 1108  SpO2 100 % 08/09/20 1108  Vitals shown include unvalidated device data.  Last Pain:  Vitals:   08/09/20 0923  TempSrc: Oral  PainSc: 0-No pain         Complications: No complications documented.

## 2020-08-10 ENCOUNTER — Encounter (HOSPITAL_COMMUNITY): Payer: Self-pay | Admitting: Orthopaedic Surgery

## 2020-08-10 LAB — CUP PACEART REMOTE DEVICE CHECK
Date Time Interrogation Session: 20220413040517
Implantable Pulse Generator Implant Date: 20190315

## 2020-08-12 DIAGNOSIS — L304 Erythema intertrigo: Secondary | ICD-10-CM | POA: Diagnosis not present

## 2020-08-12 DIAGNOSIS — L988 Other specified disorders of the skin and subcutaneous tissue: Secondary | ICD-10-CM | POA: Diagnosis not present

## 2020-08-12 DIAGNOSIS — R21 Rash and other nonspecific skin eruption: Secondary | ICD-10-CM | POA: Diagnosis not present

## 2020-08-15 DIAGNOSIS — H35373 Puckering of macula, bilateral: Secondary | ICD-10-CM | POA: Diagnosis not present

## 2020-08-15 DIAGNOSIS — Z961 Presence of intraocular lens: Secondary | ICD-10-CM | POA: Diagnosis not present

## 2020-08-15 DIAGNOSIS — H35453 Secondary pigmentary degeneration, bilateral: Secondary | ICD-10-CM | POA: Diagnosis not present

## 2020-08-15 DIAGNOSIS — H353131 Nonexudative age-related macular degeneration, bilateral, early dry stage: Secondary | ICD-10-CM | POA: Diagnosis not present

## 2020-08-15 DIAGNOSIS — H35363 Drusen (degenerative) of macula, bilateral: Secondary | ICD-10-CM | POA: Diagnosis not present

## 2020-08-15 DIAGNOSIS — H25812 Combined forms of age-related cataract, left eye: Secondary | ICD-10-CM | POA: Diagnosis not present

## 2020-08-15 DIAGNOSIS — H43813 Vitreous degeneration, bilateral: Secondary | ICD-10-CM | POA: Diagnosis not present

## 2020-08-21 HISTORY — PX: A FLUTTER ABLATION: SHX5348

## 2020-08-24 NOTE — Progress Notes (Signed)
Carelink Summary Report / Loop Recorder 

## 2020-09-02 DIAGNOSIS — I10 Essential (primary) hypertension: Secondary | ICD-10-CM | POA: Diagnosis not present

## 2020-09-02 DIAGNOSIS — I4819 Other persistent atrial fibrillation: Secondary | ICD-10-CM | POA: Diagnosis not present

## 2020-09-02 DIAGNOSIS — E669 Obesity, unspecified: Secondary | ICD-10-CM | POA: Diagnosis not present

## 2020-09-02 DIAGNOSIS — R0600 Dyspnea, unspecified: Secondary | ICD-10-CM | POA: Diagnosis not present

## 2020-09-02 DIAGNOSIS — E785 Hyperlipidemia, unspecified: Secondary | ICD-10-CM | POA: Diagnosis not present

## 2020-09-02 DIAGNOSIS — I4891 Unspecified atrial fibrillation: Secondary | ICD-10-CM | POA: Diagnosis not present

## 2020-09-02 DIAGNOSIS — I4892 Unspecified atrial flutter: Secondary | ICD-10-CM | POA: Diagnosis not present

## 2020-09-02 DIAGNOSIS — I491 Atrial premature depolarization: Secondary | ICD-10-CM | POA: Diagnosis not present

## 2020-09-02 DIAGNOSIS — G4730 Sleep apnea, unspecified: Secondary | ICD-10-CM | POA: Diagnosis not present

## 2020-09-02 DIAGNOSIS — R9431 Abnormal electrocardiogram [ECG] [EKG]: Secondary | ICD-10-CM | POA: Diagnosis not present

## 2020-09-02 DIAGNOSIS — I4811 Longstanding persistent atrial fibrillation: Secondary | ICD-10-CM | POA: Diagnosis not present

## 2020-09-05 ENCOUNTER — Telehealth: Payer: Self-pay

## 2020-09-05 NOTE — Telephone Encounter (Signed)
The patient wanted to know if she was in A-fib? I had the nurse Leigh, rn review the transmission and as of this morning the patient is in a normal sinus rhythm. The patient was happy to hear that. She thanked me for my help.

## 2020-09-08 ENCOUNTER — Telehealth: Payer: Self-pay

## 2020-09-08 DIAGNOSIS — I4811 Longstanding persistent atrial fibrillation: Secondary | ICD-10-CM | POA: Diagnosis not present

## 2020-09-08 NOTE — Telephone Encounter (Signed)
The patient called stating someone called her because we could not get a reading from her monitor. She had unplug the monitor because she is selling her house and the realtor does not want medical equipment out.I assure her that her monitor is functioning properly and I do not know who called her. She thanked me for my help.

## 2020-09-12 ENCOUNTER — Ambulatory Visit (INDEPENDENT_AMBULATORY_CARE_PROVIDER_SITE_OTHER): Payer: Medicare PPO

## 2020-09-12 DIAGNOSIS — I4819 Other persistent atrial fibrillation: Secondary | ICD-10-CM

## 2020-09-14 ENCOUNTER — Other Ambulatory Visit: Payer: Self-pay | Admitting: Orthopaedic Surgery

## 2020-09-14 ENCOUNTER — Telehealth: Payer: Self-pay

## 2020-09-14 LAB — CUP PACEART REMOTE DEVICE CHECK
Date Time Interrogation Session: 20220516043003
Implantable Pulse Generator Implant Date: 20190315

## 2020-09-14 NOTE — Telephone Encounter (Signed)
The patient left a message asking for the results to her recent transmission.

## 2020-09-14 NOTE — Telephone Encounter (Signed)
Attempted to return patient phone call.  No answer.  LVM advising transmission only shows AF which we already know about.  Provided callback number to return call if she has further questions.

## 2020-09-15 ENCOUNTER — Other Ambulatory Visit: Payer: Self-pay | Admitting: Internal Medicine

## 2020-09-15 ENCOUNTER — Other Ambulatory Visit: Payer: Medicare PPO

## 2020-09-15 DIAGNOSIS — R195 Other fecal abnormalities: Secondary | ICD-10-CM

## 2020-09-15 NOTE — Telephone Encounter (Signed)
Patient requested to know if she has been in AF since 5/13 when she had ablation and DCCV at [facility].  Advised based on last nights data she has not had any AF since 5/13.

## 2020-09-19 LAB — CALPROTECTIN, FECAL: Calprotectin, Fecal: 36 ug/g (ref 0–120)

## 2020-09-19 NOTE — Progress Notes (Signed)
HPI: FU PAF. 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 Patient seen in September 2017 with complaints of palpitations and near syncope. Patient was scheduled for an exercise treadmill and when she came she was noted to have runs of paroxysmal atrial tachycardia/fibrillation. Procedure was canceled. Monitor October 2018 showed paroxysmal atririllation and flutter. Nuclear study October 2018 showed ejection fraction 67%, apical thinning but no ischemia. Placed on apixaban.  Had implantable loop recorder placed by Dr. Rayann Heman for syncope and recurrent palpitations. She has been found to have "heart pounding" episodes not related to arrhythmia. Echo repeated 2/21 showed normal LV function, grade 1 DD, mild to moderate LAE. Coronary CTA 2/21 showed Ca score 36 and minimal (0-24%) in proximal LM, proximal and mid LAD and proximal Lcx.  Patient had atrial fibrillation ablation at [facility] May 2022.  Since last seen, she has some dyspnea on exertion but improved since her ablation.  She continues to have pedal edema and feels that amiodarone and Toprol may be contributing.  There is no chest pain, palpitations or feelings of recurrent atrial fibrillation.  Current Outpatient Medications  Medication Sig Dispense Refill   amiodarone (PACERONE) 200 MG tablet Take by mouth.     ELIQUIS 5 MG TABS tablet TAKE 1 TABLET BY MOUTH TWICE DAILY. (Patient taking differently: Take 5 mg by mouth 2 (two) times daily.) 60 tablet 5   furosemide (LASIX) 40 MG tablet Take 2 tablets (80 mg total) by mouth daily. (Patient taking differently: Take 80 mg by mouth in the morning.) 180 tablet 3   hydroxychloroquine (PLAQUENIL) 200 MG tablet Take 200 mg by mouth 2 (two) times daily.     metoprolol succinate (TOPROL-XL) 100 MG 24 hr tablet Take 1 tablet (100 mg total) by mouth 2 (two) times daily. 180 tablet 3   Multiple Vitamin (MULTIVITAMIN WITH MINERALS) TABS tablet Take 1 tablet by mouth in the morning. One-A-Day  for Women 50+     pantoprazole (PROTONIX) 40 MG tablet Take 40 mg by mouth daily.     potassium chloride (KLOR-CON) 10 MEQ tablet Take 20 mEq by mouth daily with breakfast.     Propylene Glycol 0.6 % SOLN Place 1 drop into both eyes at bedtime.     No current facility-administered medications for this visit.     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   PONV (postoperative nausea and vomiting)    Torn meniscus    left    Past Surgical History:  Procedure Laterality Date   CATARACT EXTRACTION     COLONOSCOPY     EYE SURGERY Left    HEMORRHOID BANDING     KNEE ARTHROSCOPY Left 08/09/2020   Procedure: LEFT KNEE ARTHROSCOPY MEDIAL AND LATERAL MENISECTONMY AND CONDROPLASTY;  Surgeon: Rhona Raider,  Collier Salina, MD;  Location: [facility];  Service: Orthopedics;  Laterality: Left;   LOOP RECORDER INSERTION N/A 07/05/2017   Procedure: LOOP RECORDER INSERTION;  Surgeon: Thompson Grayer, MD;  Location: [place] [facility];  Service: Cardiovascular;  Laterality: N/A;   TUBAL LIGATION  1980   WISDOM TOOTH EXTRACTION      Social History   Socioeconomic History   Marital status: Married    Spouse name: [name]   Number of children: 2   Years of education: MAx2   Highest education level: Not on file  Occupational History   Occupation: Retired    Fish farm manager: [employer]  Tobacco Use   Smoking status: Never   Smokeless tobacco: Never  Vaping Use   Vaping Use:  Never used  Substance and Sexual Activity   Alcohol use: Yes    Alcohol/week: 4.0 standard drinks    Types: 4 Glasses of wine per week    Comment: 4 glasses of wine weekly   Drug use: No   Sexual activity: Not Currently  Other Topics Concern   Not on file  Social History Narrative   Patient lives at home with her spouse.   Caffeine Use: 2-3 cups daily   Social Determinants of Health   Financial Resource Strain: Not on file  Food Insecurity: Not on file  Transportation Needs: Not on file  Physical Activity: Not on file  Stress: Not on file  Social Connections: Not on file  Intimate Partner Violence: Not on file    Family History  Problem Relation Age of Onset   Stroke Mother    Colon cancer Mother 28   Heart attack Father 61   Emphysema Father    Thyroid disease Son    Thyroid disease Maternal Grandmother    Cancer Maternal Grandmother        cervical    Thrombosis Maternal Grandfather     ROS: no fevers or chills, productive cough, hemoptysis, dysphasia, odynophagia, melena, hematochezia, dysuria, hematuria, rash, seizure activity, orthopnea, PND, claudication. Remaining systems are negative.  Physical Exam: Well-developed well-nourished in no acute distress.  Skin is warm and dry.  HEENT is normal.  Neck is supple.  Chest is clear to auscultation with normal expansion.  Cardiovascular exam is regular rate and rhythm.  Abdominal exam nontender or distended. No masses palpated. Extremities show trace edema. neuro grossly intact  ECG-sinus rhythm at a rate of 76, borderline first-degree AV block, no ST changes, prolonged QT interval.  Personally reviewed  A/P  1 persistent atrial fibrillation-She is s/p ablation 09/02/20.  She remains in sinus rhythm.  Continue apixaban, amiodarone and Toprol.  She feels as though amiodarone is contributing to her pedal edema.  I would like to continue for 3 to 4 months following recent ablation as this would be the highest risk  time for recurrent atrial fibrillation.  We can then discontinue the amiodarone and follow if that is the plan from [facility].  2 hypertension-blood pressure controlled.  Continue present medical regimen.  3 coronary atherosclerosis-patient is not on aspirin given need for anticoagulation.  She has declined lipid-lowering medications.  4 history of syncope-she has had no recurrences.  5 lower extremity edema-continue diuretic at present dose.  Check potassium and renal function.  Kirk Ruths, MD

## 2020-09-20 LAB — PANCREATIC ELASTASE, FECAL: Pancreatic Elastase-1, Stool: 134 mcg/g — ABNORMAL LOW

## 2020-09-21 DIAGNOSIS — Z471 Aftercare following joint replacement surgery: Secondary | ICD-10-CM | POA: Diagnosis not present

## 2020-09-21 DIAGNOSIS — I10 Essential (primary) hypertension: Secondary | ICD-10-CM | POA: Diagnosis not present

## 2020-09-21 DIAGNOSIS — Z96652 Presence of left artificial knee joint: Secondary | ICD-10-CM | POA: Diagnosis not present

## 2020-09-28 DIAGNOSIS — Z961 Presence of intraocular lens: Secondary | ICD-10-CM | POA: Diagnosis not present

## 2020-09-28 DIAGNOSIS — H353131 Nonexudative age-related macular degeneration, bilateral, early dry stage: Secondary | ICD-10-CM | POA: Diagnosis not present

## 2020-09-28 DIAGNOSIS — H5315 Visual distortions of shape and size: Secondary | ICD-10-CM | POA: Diagnosis not present

## 2020-09-28 DIAGNOSIS — H35363 Drusen (degenerative) of macula, bilateral: Secondary | ICD-10-CM | POA: Diagnosis not present

## 2020-09-28 DIAGNOSIS — H35453 Secondary pigmentary degeneration, bilateral: Secondary | ICD-10-CM | POA: Diagnosis not present

## 2020-09-28 DIAGNOSIS — H25812 Combined forms of age-related cataract, left eye: Secondary | ICD-10-CM | POA: Diagnosis not present

## 2020-09-28 DIAGNOSIS — H43813 Vitreous degeneration, bilateral: Secondary | ICD-10-CM | POA: Diagnosis not present

## 2020-09-28 DIAGNOSIS — H35371 Puckering of macula, right eye: Secondary | ICD-10-CM | POA: Diagnosis not present

## 2020-09-30 ENCOUNTER — Telehealth: Payer: Self-pay

## 2020-09-30 DIAGNOSIS — Z0000 Encounter for general adult medical examination without abnormal findings: Secondary | ICD-10-CM | POA: Diagnosis not present

## 2020-09-30 DIAGNOSIS — I10 Essential (primary) hypertension: Secondary | ICD-10-CM | POA: Diagnosis not present

## 2020-09-30 DIAGNOSIS — R946 Abnormal results of thyroid function studies: Secondary | ICD-10-CM | POA: Diagnosis not present

## 2020-09-30 DIAGNOSIS — I480 Paroxysmal atrial fibrillation: Secondary | ICD-10-CM | POA: Diagnosis not present

## 2020-09-30 DIAGNOSIS — I700 Atherosclerosis of aorta: Secondary | ICD-10-CM | POA: Diagnosis not present

## 2020-09-30 DIAGNOSIS — G259 Extrapyramidal and movement disorder, unspecified: Secondary | ICD-10-CM | POA: Diagnosis not present

## 2020-09-30 NOTE — Telephone Encounter (Signed)
Reviewed LINQ reports with patient to date. Reassured no AF episodes noted since ablation at [facility] on 09/02/20. Questions answered.

## 2020-09-30 NOTE — Telephone Encounter (Signed)
Patient called in wanting to know if a nurse can give her a call about her ILR readings and assure her everything is ok as she has a appt with her PCP and she wants to be able to tell her that everything has been ok. She would like a call back by 10:30

## 2020-10-03 ENCOUNTER — Ambulatory Visit (INDEPENDENT_AMBULATORY_CARE_PROVIDER_SITE_OTHER): Payer: Medicare PPO | Admitting: Cardiology

## 2020-10-03 ENCOUNTER — Other Ambulatory Visit: Payer: Self-pay

## 2020-10-03 ENCOUNTER — Encounter: Payer: Self-pay | Admitting: Cardiology

## 2020-10-03 VITALS — BP 140/74 | HR 76 | Ht 64.0 in | Wt 211.4 lb

## 2020-10-03 DIAGNOSIS — I480 Paroxysmal atrial fibrillation: Secondary | ICD-10-CM | POA: Diagnosis not present

## 2020-10-03 DIAGNOSIS — I2584 Coronary atherosclerosis due to calcified coronary lesion: Secondary | ICD-10-CM | POA: Diagnosis not present

## 2020-10-03 DIAGNOSIS — I10 Essential (primary) hypertension: Secondary | ICD-10-CM

## 2020-10-03 DIAGNOSIS — I2510 Atherosclerotic heart disease of native coronary artery without angina pectoris: Secondary | ICD-10-CM

## 2020-10-03 NOTE — Patient Instructions (Signed)
Medication Instructions:   STOP AMIODARONE IN 3 MONTHS  *If you need a refill on your cardiac medications before your next appointment, please call your pharmacy*   Lab Work  Your physician recommends that you HAVE LAB WORK TODAY  If you have labs (blood work) drawn today and your tests are completely normal, you will receive your results only by: [place] (if you have MyChart) OR A paper copy in the mail If you have any lab test that is abnormal or we need to change your treatment, we will call you to review the results.   Follow-Up: At [facility], you and your health needs are our priority.  As part of our continuing mission to provide you with exceptional heart care, we have created designated Provider Care Teams.  These Care Teams include your primary Cardiologist (physician) and Advanced Practice Providers (APPs -  Physician Assistants and Nurse Practitioners) who all work together to provide you with the care you need, when you need it.  We recommend signing up for the patient portal called "MyChart".  Sign up information is provided on this After Visit Summary.  MyChart is used to connect with patients for Virtual Visits (Telemedicine).  Patients are able to view lab/test results, encounter notes, upcoming appointments, etc.  Non-urgent messages can be sent to your provider as well.   To learn more about what you can do with MyChart, go to [email or web address].    Your next appointment:   6 month(s)  The format for your next appointment:   In Person  Provider:   Kirk Ruths, MD

## 2020-10-04 ENCOUNTER — Encounter (HOSPITAL_BASED_OUTPATIENT_CLINIC_OR_DEPARTMENT_OTHER): Payer: Self-pay | Admitting: Obstetrics & Gynecology

## 2020-10-04 ENCOUNTER — Other Ambulatory Visit (HOSPITAL_COMMUNITY)
Admission: RE | Admit: 2020-10-04 | Discharge: 2020-10-04 | Disposition: A | Discharge status: Home / Self Care | Payer: Medicare PPO | Source: Ambulatory Visit | Attending: Obstetrics & Gynecology | Admitting: Obstetrics & Gynecology

## 2020-10-04 ENCOUNTER — Ambulatory Visit (INDEPENDENT_AMBULATORY_CARE_PROVIDER_SITE_OTHER): Payer: Medicare PPO | Admitting: Obstetrics & Gynecology

## 2020-10-04 VITALS — BP 148/84 | HR 70 | Ht 64.0 in | Wt 210.4 lb

## 2020-10-04 DIAGNOSIS — Z124 Encounter for screening for malignant neoplasm of cervix: Secondary | ICD-10-CM | POA: Diagnosis not present

## 2020-10-04 DIAGNOSIS — R350 Frequency of micturition: Secondary | ICD-10-CM | POA: Diagnosis not present

## 2020-10-04 DIAGNOSIS — N898 Other specified noninflammatory disorders of vagina: Secondary | ICD-10-CM

## 2020-10-04 LAB — POCT URINALYSIS DIPSTICK
Bilirubin, UA: NEGATIVE
Blood, UA: NEGATIVE
Glucose, UA: NEGATIVE
Ketones, UA: NEGATIVE
Leukocytes, UA: NEGATIVE
Nitrite, UA: NEGATIVE
Protein, UA: NEGATIVE
Spec Grav, UA: 1.015 (ref 1.010–1.025)
Urobilinogen, UA: 0.2 E.U./dL
pH, UA: 7.5 (ref 5.0–8.0)

## 2020-10-04 NOTE — Progress Notes (Signed)
Carelink Summary Report / Loop Recorder 

## 2020-10-04 NOTE — Progress Notes (Signed)
80 y.o. Married White or Caucasian female here for new patient appointment.  Referred from Dr. Dema Severin.  Having some vaginal discharge and odor.    H/o a fib.  Treated with cardiac ablation.  Currently, on amiodarone and eliquis.  Will hopefully come off the amiodarone.  Cardiac ablation was done at [facility].    Moving into [facility] on July 1st.    Has h/o autoimmune encephalitis.  Managed at [facility] now with rheumatologist.  On hydrochloroquine.  Does has some dystonic movement in her right hand and food.    Having a little urinary urgency and would like urine tested.  No LMP recorded. Patient is postmenopausal.          The current method of family planning is post menopausal status.    Smoker:  no  Health Maintenance: Pap:  not indicated History of abnormal Pap:  no MMG:  done at [facility].  Pt states this is up to date. Colonoscopy:  02/2016 BMD:   done with Dr. Dema Severin TDaP:  2014 Pneumonia vaccine(s):  2019, 2020 Shingrix:   completed Hep C testing: lab work done with Dr. Dema Severin Screening Labs: obtained with Dr. Dema Severin   reports that she has never smoked. She has never used smokeless tobacco. She reports current alcohol use of about 4.0 standard drinks of alcohol per week. She reports that she does not use drugs.  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   PONV (postoperative nausea and vomiting)    Torn meniscus    left    Past Surgical  History:  Procedure Laterality Date   CATARACT EXTRACTION     COLONOSCOPY     EYE SURGERY Left    HEMORRHOID BANDING     KNEE ARTHROSCOPY Left 08/09/2020   Procedure: LEFT KNEE ARTHROSCOPY MEDIAL AND LATERAL MENISECTONMY AND CONDROPLASTY;  Surgeon: Melrose Nakayama, MD;  Location: [facility];  Service: Orthopedics;  Laterality: Left;   LOOP RECORDER INSERTION N/A 07/05/2017   Procedure: LOOP RECORDER INSERTION;  Surgeon: Thompson Grayer, MD;  Location: [place] [facility];  Service: Cardiovascular;  Laterality: N/A;   TUBAL LIGATION  1980   WISDOM TOOTH EXTRACTION      Current Outpatient Medications  Medication Sig Dispense Refill   amiodarone (PACERONE) 200 MG tablet Take by mouth.     ELIQUIS 5 MG TABS tablet TAKE 1 TABLET BY MOUTH TWICE DAILY. (Patient taking differently: Take 5 mg by mouth 2 (two) times daily.) 60 tablet 5   furosemide (LASIX) 40 MG tablet Take 2 tablets (80 mg total) by mouth daily. (Patient taking differently: Take 80 mg by mouth in the morning.) 180 tablet 3   gabapentin (NEURONTIN) 300 MG capsule Take 300 mg by mouth 3 (three) times daily.     hydroxychloroquine (PLAQUENIL) 200 MG  tablet Take 200 mg by mouth 2 (two) times daily.     metoprolol succinate (TOPROL-XL) 100 MG 24 hr tablet Take 1 tablet (100 mg total) by mouth 2 (two) times daily. 180 tablet 3   Multiple Vitamin (MULTIVITAMIN WITH MINERALS) TABS tablet Take 1 tablet by mouth in the morning. One-A-Day for Women 50+     potassium chloride (KLOR-CON) 10 MEQ tablet Take 20 mEq by mouth daily with breakfast.     Propylene Glycol 0.6 % SOLN Place 1 drop into both eyes at bedtime.     pantoprazole (PROTONIX) 40 MG tablet Take 40 mg by mouth daily. (Patient not taking: Reported on 10/04/2020)     No current facility-administered medications for this visit.    Family History  Problem Relation Age of Onset   Stroke Mother    Colon cancer Mother 81   Heart attack Father 48   Emphysema Father    Thyroid disease Son     Thyroid disease Maternal Grandmother    Cancer Maternal Grandmother        cervical    Thrombosis Maternal Grandfather     Review of Systems  Constitutional: Negative.   Genitourinary: Negative.   Psychiatric/Behavioral: Negative.     Exam:   BP (!) 148/84 (BP Location: Right Arm, Patient Position: Sitting, Cuff Size: Large)   Pulse 70   Ht 5\' 4"  (1.626 m) Comment: Reported  Wt 210 lb 6.4 oz (95.4 kg)   BMI 36.12 kg/m   Height: 5\' 4"  (162.6 cm) (Reported)  General appearance: alert, cooperative and appears stated age Head: Normocephalic, without obvious abnormality, atraumatic Breasts: normal appearance, no masses or tenderness Heart: regular rate and rhythm Abdomen: soft, non-tender; bowel sounds normal; no masses,  no organomegaly Extremities: extremities normal, atraumatic, no cyanosis or edema Skin: Skin color, texture, turgor normal. No rashes or lesions Lymph nodes: Cervical, supraclavicular, and axillary nodes normal. No abnormal inguinal nodes palpated Neurologic: Grossly normal   Pelvic: External genitalia:  no lesions              Urethra:  normal appearing urethra with no masses, tenderness or lesions              Bartholins and Skenes: normal                 Vagina: normal appearing vagina with normal color and no discharge, no lesions              Cervix: no lesions              Pap taken: Yes.   Bimanual Exam:  Uterus:  normal size, contour, position, consistency, mobility, non-tender              Adnexa: normal adnexa and no mass, fullness, tenderness  Chaperone, Prince Rome, CMA, was present for exam.  Assessment/Plan: 1. Urinary frequency - POCT Urinalysis Dipstick was negative today.  No additional testing recommended.  2. Vaginal odor - Cervicovaginal ancillary only( [place]) - Cytology - PAP - PR OBTAINING SCREEN PAP SMEAR - if this is negative, will treat with topical agent for vaginal atrophy  3.  Vaginal atrophy  4. Cervical cancer  screening - Cytology - PAP - PR OBTAINING SCREEN PAP SMEAR

## 2020-10-04 NOTE — Progress Notes (Signed)
poct

## 2020-10-05 LAB — CERVICOVAGINAL ANCILLARY ONLY
Bacterial Vaginitis (gardnerella): NEGATIVE
Candida Glabrata: NEGATIVE
Candida Vaginitis: NEGATIVE
Comment: NEGATIVE
Comment: NEGATIVE
Comment: NEGATIVE

## 2020-10-06 ENCOUNTER — Other Ambulatory Visit (HOSPITAL_BASED_OUTPATIENT_CLINIC_OR_DEPARTMENT_OTHER): Payer: Self-pay

## 2020-10-06 LAB — CYTOLOGY - PAP
Diagnosis: NEGATIVE
Diagnosis: REACTIVE

## 2020-10-10 ENCOUNTER — Ambulatory Visit: Payer: Medicare PPO | Admitting: Cardiology

## 2020-10-11 DIAGNOSIS — R141 Gas pain: Secondary | ICD-10-CM | POA: Diagnosis not present

## 2020-10-11 DIAGNOSIS — R142 Eructation: Secondary | ICD-10-CM | POA: Diagnosis not present

## 2020-10-11 DIAGNOSIS — R195 Other fecal abnormalities: Secondary | ICD-10-CM | POA: Diagnosis not present

## 2020-10-11 DIAGNOSIS — R143 Flatulence: Secondary | ICD-10-CM | POA: Diagnosis not present

## 2020-10-17 ENCOUNTER — Other Ambulatory Visit (HOSPITAL_BASED_OUTPATIENT_CLINIC_OR_DEPARTMENT_OTHER): Payer: Self-pay | Admitting: Obstetrics & Gynecology

## 2020-10-17 ENCOUNTER — Encounter (HOSPITAL_BASED_OUTPATIENT_CLINIC_OR_DEPARTMENT_OTHER): Payer: Self-pay

## 2020-10-17 ENCOUNTER — Ambulatory Visit (INDEPENDENT_AMBULATORY_CARE_PROVIDER_SITE_OTHER): Payer: Medicare PPO

## 2020-10-17 DIAGNOSIS — I480 Paroxysmal atrial fibrillation: Secondary | ICD-10-CM

## 2020-10-17 LAB — CUP PACEART REMOTE DEVICE CHECK
Date Time Interrogation Session: 20220626230716
Implantable Pulse Generator Implant Date: 20190315

## 2020-10-17 MED ORDER — NONFORMULARY OR COMPOUNDED ITEM: 3 refills | Status: DC

## 2020-10-18 ENCOUNTER — Encounter (HOSPITAL_BASED_OUTPATIENT_CLINIC_OR_DEPARTMENT_OTHER): Payer: Self-pay | Admitting: Obstetrics & Gynecology

## 2020-10-21 ENCOUNTER — Telehealth: Payer: Self-pay | Admitting: Internal Medicine

## 2020-10-21 ENCOUNTER — Other Ambulatory Visit: Payer: Self-pay | Admitting: Internal Medicine

## 2020-10-21 ENCOUNTER — Encounter: Payer: Self-pay | Admitting: Internal Medicine

## 2020-10-21 DIAGNOSIS — K6389 Other specified diseases of intestine: Secondary | ICD-10-CM

## 2020-10-21 DIAGNOSIS — U071 COVID-19: Secondary | ICD-10-CM

## 2020-10-21 DIAGNOSIS — K638219 Small intestinal bacterial overgrowth, unspecified: Secondary | ICD-10-CM

## 2020-10-21 HISTORY — DX: Other specified diseases of intestine: K63.89

## 2020-10-21 HISTORY — DX: COVID-19: U07.1

## 2020-10-21 HISTORY — DX: Small intestinal bacterial overgrowth, unspecified: K63.8219

## 2020-10-21 NOTE — Telephone Encounter (Signed)
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  Explain the neck step is usually a course of antibiotics to see if that relieves patient's symptoms.  I am recommending Xifaxan 550 mg 3 times daily x14 days it looks like she has coverage though prior Auth is required we can use SIBO and IBS diagnoses   Explained to her that even if approved or too expensive she should let us know and we will see what we can do about that versus an alternative antibiotic  Lets get her an appointment with me for August, about 2 months from now September would be okay also.  Let her know that there is not necessarily an immediate response to the medication.  It can take time for the full effects to be seen.  We are trying to reduce her bloating, gas and loose stools.

## 2020-10-21 NOTE — Telephone Encounter (Signed)
Left message for patient to call back  

## 2020-10-25 ENCOUNTER — Ambulatory Visit: Payer: Medicare PPO | Admitting: Orthopaedic Surgery

## 2020-10-26 ENCOUNTER — Telehealth: Payer: Self-pay

## 2020-10-26 MED ORDER — RIFAXIMIN 550 MG PO TABS: 550.0000 mg | ORAL_TABLET | Freq: Three times a day (TID) | ORAL | 0 refills | Status: DC

## 2020-10-26 NOTE — Telephone Encounter (Signed)
I started a prior authorization for patient's xifaxan 550mg  tablets. 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 This was done thru Covermymeds. Will check back for an answer. Husband informed that we are working on it.

## 2020-10-26 NOTE — Telephone Encounter (Signed)
Patient notified  New rx sent Follow up arranged for 9/14

## 2020-10-28 NOTE — Telephone Encounter (Signed)
Xifaxan has been approved thru 01/24/21. [place] informed via fax and I also called Nylene and told her.

## 2020-11-02 DIAGNOSIS — R531 Weakness: Secondary | ICD-10-CM | POA: Diagnosis not present

## 2020-11-02 DIAGNOSIS — R600 Localized edema: Secondary | ICD-10-CM | POA: Diagnosis not present

## 2020-11-02 DIAGNOSIS — R21 Rash and other nonspecific skin eruption: Secondary | ICD-10-CM | POA: Diagnosis not present

## 2020-11-02 DIAGNOSIS — G249 Dystonia, unspecified: Secondary | ICD-10-CM | POA: Diagnosis not present

## 2020-11-02 DIAGNOSIS — I4811 Longstanding persistent atrial fibrillation: Secondary | ICD-10-CM | POA: Diagnosis not present

## 2020-11-02 DIAGNOSIS — I10 Essential (primary) hypertension: Secondary | ICD-10-CM | POA: Diagnosis not present

## 2020-11-02 DIAGNOSIS — G2589 Other specified extrapyramidal and movement disorders: Secondary | ICD-10-CM | POA: Diagnosis not present

## 2020-11-02 DIAGNOSIS — M25562 Pain in left knee: Secondary | ICD-10-CM | POA: Diagnosis not present

## 2020-11-02 DIAGNOSIS — I4819 Other persistent atrial fibrillation: Secondary | ICD-10-CM | POA: Diagnosis not present

## 2020-11-02 DIAGNOSIS — Z7409 Other reduced mobility: Secondary | ICD-10-CM | POA: Diagnosis not present

## 2020-11-02 DIAGNOSIS — E785 Hyperlipidemia, unspecified: Secondary | ICD-10-CM | POA: Diagnosis not present

## 2020-11-03 NOTE — Progress Notes (Signed)
Carelink Summary Report / Loop Recorder 

## 2020-11-10 MED ORDER — METOPROLOL SUCCINATE ER 50 MG PO TB24: 50.0000 mg | ORAL_TABLET | Freq: Two times a day (BID) | ORAL | Status: DC

## 2020-11-10 MED ORDER — METOPROLOL SUCCINATE ER 50 MG PO TB24: 50.0000 mg | ORAL_TABLET | Freq: Two times a day (BID) | ORAL | 3 refills | Status: DC

## 2020-11-10 NOTE — Addendum Note (Signed)
Addended by: Cristopher Estimable on: 11/10/2020 02:36 PM   Modules accepted: Orders

## 2020-11-15 ENCOUNTER — Encounter: Payer: Self-pay | Admitting: Physical Medicine & Rehabilitation

## 2020-11-19 LAB — CUP PACEART REMOTE DEVICE CHECK
Date Time Interrogation Session: 20220729231146
Implantable Pulse Generator Implant Date: 20190315

## 2020-11-21 ENCOUNTER — Ambulatory Visit (INDEPENDENT_AMBULATORY_CARE_PROVIDER_SITE_OTHER): Payer: Medicare PPO

## 2020-11-21 ENCOUNTER — Other Ambulatory Visit: Payer: Self-pay | Admitting: Cardiology

## 2020-11-21 DIAGNOSIS — I480 Paroxysmal atrial fibrillation: Secondary | ICD-10-CM

## 2020-11-21 NOTE — Telephone Encounter (Signed)
Prescription refill request for Eliquis received. Indication:atrial fib Last office visit:6/22 Scr:0.6 Age: 80 Weight:95.4 kg  Prescription refilled

## 2020-11-27 DIAGNOSIS — Z20822 Contact with and (suspected) exposure to covid-19: Secondary | ICD-10-CM | POA: Diagnosis not present

## 2020-11-28 ENCOUNTER — Encounter: Payer: Self-pay | Admitting: Orthopaedic Surgery

## 2020-11-28 DIAGNOSIS — U071 COVID-19: Secondary | ICD-10-CM | POA: Diagnosis not present

## 2020-11-28 DIAGNOSIS — I480 Paroxysmal atrial fibrillation: Secondary | ICD-10-CM | POA: Diagnosis not present

## 2020-11-28 DIAGNOSIS — D6869 Other thrombophilia: Secondary | ICD-10-CM | POA: Diagnosis not present

## 2020-12-05 ENCOUNTER — Encounter: Payer: Self-pay | Admitting: Orthopaedic Surgery

## 2020-12-05 ENCOUNTER — Other Ambulatory Visit: Payer: Self-pay

## 2020-12-05 ENCOUNTER — Ambulatory Visit: Payer: Medicare PPO | Admitting: Orthopaedic Surgery

## 2020-12-05 DIAGNOSIS — M25561 Pain in right knee: Secondary | ICD-10-CM | POA: Diagnosis not present

## 2020-12-05 DIAGNOSIS — M25562 Pain in left knee: Secondary | ICD-10-CM | POA: Diagnosis not present

## 2020-12-05 DIAGNOSIS — M1712 Unilateral primary osteoarthritis, left knee: Secondary | ICD-10-CM | POA: Diagnosis not present

## 2020-12-05 DIAGNOSIS — G8929 Other chronic pain: Secondary | ICD-10-CM

## 2020-12-05 DIAGNOSIS — M1711 Unilateral primary osteoarthritis, right knee: Secondary | ICD-10-CM | POA: Diagnosis not present

## 2020-12-05 MED ORDER — METHYLPREDNISOLONE ACETATE 40 MG/ML IJ SUSP
40.0000 mg | INTRAMUSCULAR | Status: AC | PRN
  Administered 2020-12-05: 40 mg via INTRA_ARTICULAR

## 2020-12-05 MED ORDER — LIDOCAINE HCL 1 % IJ SOLN
3.0000 mL | INTRAMUSCULAR | Status: AC | PRN
  Administered 2020-12-05: 3 mL

## 2020-12-05 NOTE — Progress Notes (Signed)
Office Visit Note   Patient: Lindsay Clark           Date of Birth: July 09, 1940           MRN: [id number] Visit Date: 12/05/2020              Requested by: [name], MD [place],  [place] [id number] PCP: [name], MD   Assessment & Plan: Visit Diagnoses:  1. Chronic pain of left knee   2. Chronic pain of right knee   3. Unilateral primary osteoarthritis, left knee   4. Unilateral primary osteoarthritis, right knee     Plan: I did aspirate 25 cc to 30 cc of yellow fluid from her left knee and about 15 cc of yellow fluid from the right knee.  I placed steroid injection in both knees.  We talked in length in detail about the possibility replacement surgery at some point.  She has a lot of travel plans and wants to hold off on any type of knee replacement surgery as long as she can.  We can see her back in 3 months to consider repeat aspirations and steroid injections in both knees.  All questions and concerns were answered and addressed.  Follow-Up Instructions: Return in about 3 months (around 03/07/2021).   Orders:  Orders Placed This Encounter  Procedures   Large Joint Inj   Large Joint Inj    No orders of the defined types were placed in this encounter.     Procedures: Large Joint Inj: L knee on 12/05/2020 11:34 AM Indications: diagnostic evaluation and pain Details: 22 G 1.5 in needle, superolateral approach  Arthrogram: No  Medications: 3 mL lidocaine 1 %; 40 mg methylPREDNISolone acetate 40 MG/ML Outcome: tolerated well, no immediate complications Procedure, treatment alternatives, risks and benefits explained, specific risks discussed. Consent was given by the patient. Immediately prior to procedure a time out was called to verify the correct patient, procedure, equipment, support staff and site/side marked as required. Patient was prepped and draped in the usual sterile fashion.    Large Joint Inj: R knee on 12/05/2020 11:34  AM Indications: diagnostic evaluation and pain Details: 22 G 1.5 in needle, superolateral approach  Arthrogram: No  Medications: 3 mL lidocaine 1 %; 40 mg methylPREDNISolone acetate 40 MG/ML Outcome: tolerated well, no immediate complications Procedure, treatment alternatives, risks and benefits explained, specific risks discussed. Consent was given by the patient. Immediately prior to procedure a time out was called to verify the correct patient, procedure, equipment, support staff and site/side marked as required. Patient was prepped and draped in the usual sterile fashion.      Clinical Data: No additional findings.   Subjective: Chief Complaint  Patient presents with   Right Knee - Follow-up   Left Knee - Follow-up  The patient comes in today with known worsening arthritis of her left knee with valgus malalignment.  She has areas of full-thickness cartilage loss in that knee on the lateral compartment as well as a meniscal tear.  She did have a partial lateral meniscectomy by one of my colleagues in town back in April which did take care of the nerve type of pain she was having however its been difficult with the mechanical symptoms she continues to have with the weakness in that knee and difficulty getting standing from a sitting position and getting around in general.  Both knees have been swollen and she is requesting steroid injections in both  knees today and at some point wants to consider knee replacement surgery but not in the near future.  She is otherwise had no acute change in her medical status.  HPI  Review of Systems She currently denies a headache, chest pain, shortness of breath, fever, chills, nausea, vomiting  Objective: Vital Signs: There were no vitals taken for this visit.  Physical Exam She is alert and orient x3 and in no acute distress Ortho Exam Examination of both knees shows slight valgus malalignment more moderate on the left than the right.  Both  knees have a mild effusion.  The left knee is much more painful throughout the arc of motion.  She does have a problem with her gait and when she gets up and walks even to the exam table from the chair. Specialty Comments:  No specialty comments available.  Imaging: No results found.   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   Left knee pain 08/09/2020   Persistent atrial fibrillation ([place]) 04/05/2020   Secondary hypercoagulable state ([place]) 04/05/2020   Primary osteoarthritis of left knee 09/02/2019   Prolapsed internal hemorrhoids, grade 2 03/03/2019   Brain lesion 06/24/2017   Osteoarthritis    Multiple lesions on CT of brain and spine    Movement disorder    Lumbar spondylosis    Hyperlipidemia    DDD (degenerative disc disease), lumbar    Colitis    Dermatitis    Cervical spondylosis    Cervical disc disease    Diastolic dysfunction 0000000   Syncope 04/18/2016   Anxiety 04/18/2016   Hypertension    Autoimmune disorder-autoimmune encephalitis    Dystonia 03/16/2015   Coronary artery calcification of native artery 11/04/2014   Abnormal finding on MRI of brain 10/04/2014   Pruritus [id number]   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   Past Medical History:  Diagnosis Date   Atherosclerosis    mild cerebral   Atrial fibrillation and flutter (HCC)    Autoimmune disorder (HCC)    Autoimmune encephalitis    Cervical disc disease    with spondylosis   Cervical spondylosis    Chronic dermatitis    eczematous   Colitis    DDD (degenerative disc disease), lumbar    Frozen shoulder    left   Hyperlipidemia    Hypertension    IBS (irritable bowel syndrome)    with diarrhea   Lateral epicondylitis    Lumbar spondylosis    Lupus (HCC)    Movement disorder    Multiple lesions on CT of brain and spine    reports lesions from previous CT scan at [facility]    Osteoarthritis    in her  hands   PONV (postoperative nausea and vomiting)    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   Torn meniscus    left    Family History  Problem Relation Age of Onset   Cancer Mother 49       colon cancer   Stroke Mother    Colon cancer Mother 87   Heart attack Father 36   Emphysema Father    Cancer Maternal Grandmother 43       uterine   Thyroid disease Maternal Grandmother    Thrombosis Maternal Grandfather    Thyroid disease Son     Past Surgical History:  Procedure Laterality Date   CATARACT EXTRACTION     COLONOSCOPY     EYE SURGERY  Left    HEMORRHOID BANDING     KNEE ARTHROSCOPY Left 08/09/2020   Procedure: LEFT KNEE ARTHROSCOPY MEDIAL AND LATERAL MENISECTONMY AND CONDROPLASTY;  Surgeon: Melrose Nakayama, MD;  Location: [facility];  Service: Orthopedics;  Laterality: Left;   LOOP RECORDER INSERTION N/A 07/05/2017   Procedure: LOOP RECORDER INSERTION;  Surgeon: Thompson Grayer, MD;  Location: [place] [facility];  Service: Cardiovascular;  Laterality: N/A;   TUBAL LIGATION  1980   WISDOM TOOTH EXTRACTION     Social History   Occupational History   Occupation: Retired    Fish farm manager: OTHER  Tobacco Use   Smoking status: Never   Smokeless tobacco: Never  Vaping Use   Vaping Use: Never used  Substance and Sexual Activity   Alcohol use: Yes    Alcohol/week: 4.0 standard drinks    Types: 4 Glasses of wine per week    Comment: 4 glasses of wine weekly   Drug use: No   Sexual activity: Not Currently

## 2020-12-07 DIAGNOSIS — L932 Other local lupus erythematosus: Secondary | ICD-10-CM | POA: Diagnosis not present

## 2020-12-07 DIAGNOSIS — E669 Obesity, unspecified: Secondary | ICD-10-CM | POA: Diagnosis not present

## 2020-12-07 DIAGNOSIS — Z6835 Body mass index (BMI) 35.0-35.9, adult: Secondary | ICD-10-CM | POA: Diagnosis not present

## 2020-12-07 DIAGNOSIS — Z79899 Other long term (current) drug therapy: Secondary | ICD-10-CM | POA: Diagnosis not present

## 2020-12-07 DIAGNOSIS — M150 Primary generalized (osteo)arthritis: Secondary | ICD-10-CM | POA: Diagnosis not present

## 2020-12-15 NOTE — Progress Notes (Signed)
Carelink Summary Report / Loop Recorder 

## 2020-12-22 ENCOUNTER — Ambulatory Visit (INDEPENDENT_AMBULATORY_CARE_PROVIDER_SITE_OTHER): Payer: Medicare PPO

## 2020-12-22 DIAGNOSIS — I480 Paroxysmal atrial fibrillation: Secondary | ICD-10-CM

## 2020-12-22 DIAGNOSIS — H1789 Other corneal scars and opacities: Secondary | ICD-10-CM | POA: Diagnosis not present

## 2020-12-22 DIAGNOSIS — H353131 Nonexudative age-related macular degeneration, bilateral, early dry stage: Secondary | ICD-10-CM | POA: Diagnosis not present

## 2020-12-27 ENCOUNTER — Telehealth: Payer: Self-pay

## 2020-12-27 LAB — CUP PACEART REMOTE DEVICE CHECK
Date Time Interrogation Session: 20220831232509
Implantable Pulse Generator Implant Date: 20190315

## 2020-12-27 NOTE — Telephone Encounter (Signed)
I explained to the patient once the doctor put his not into and sign off on the encounter. She will then be able to see any updates in Mychart.   The patient requested a written document stating she has been out of A-fib since May 13th ablation. She states she needs it to because she has an appointment in [facility]. They can not see her remotes. I told her I will send a phone note to the nurse. The nurse will call her when she can pick the note up. The patient verbalized understanding.

## 2020-12-29 ENCOUNTER — Telehealth: Payer: Self-pay

## 2020-12-29 NOTE — Telephone Encounter (Signed)
Last 3 loop reports faxed to [facility] Dr. Willis Modena for upcoming appointment per Pt request.  Faxed to [phone] per [facility] staff.

## 2021-01-03 NOTE — Progress Notes (Signed)
Carelink Summary Report / Loop Recorder 

## 2021-01-04 ENCOUNTER — Ambulatory Visit: Payer: Medicare PPO | Admitting: Internal Medicine

## 2021-01-04 ENCOUNTER — Encounter: Payer: Self-pay | Admitting: Internal Medicine

## 2021-01-04 VITALS — BP 110/70 | HR 70 | Ht 64.0 in | Wt 211.0 lb

## 2021-01-04 DIAGNOSIS — R141 Gas pain: Secondary | ICD-10-CM

## 2021-01-04 DIAGNOSIS — R142 Eructation: Secondary | ICD-10-CM

## 2021-01-04 DIAGNOSIS — R151 Fecal smearing: Secondary | ICD-10-CM | POA: Diagnosis not present

## 2021-01-04 DIAGNOSIS — R143 Flatulence: Secondary | ICD-10-CM

## 2021-01-04 DIAGNOSIS — K6389 Other specified diseases of intestine: Secondary | ICD-10-CM

## 2021-01-04 MED ORDER — RIFAXIMIN 550 MG PO TABS: 550.0000 mg | ORAL_TABLET | Freq: Three times a day (TID) | ORAL | 0 refills | Status: DC

## 2021-01-04 NOTE — Patient Instructions (Addendum)
It was my pleasure to provide care to you today. Based on our discussion, I am providing you with my recommendations below:  RECOMMENDATION(S):   Please reduce added sugars to your diet I am providing you with literature about added sugars and their effects in your diet Call in 2 weeks to provide me with an update of your symptoms Start Xifaxan as prescribed. You are welcome to call your pharmacy to follow up on the status of your refill. We have already completed the prior authorization and it has been approved until October 2022  FOLLOW UP:  I would like for you to follow up with me in 2 months. Please refer to your appointments included within this After Visit Summary.  BMI:  If you are age 78 or older, your body mass index should be between 23-30. Your Body mass index is 36.22 kg/m. If this is out of the aforementioned range listed, please consider follow up with your Primary Care Provider.  MY CHART:  The [employer] [facility] providers would like to encourage you to use [facility] to communicate with providers for non-urgent requests or questions.  Due to long hold times on the telephone, sending your provider a message by [facility] may be a faster and more efficient way to get a response.  Please allow 48 business hours for a response.  Please remember that this is for non-urgent requests.   Thank you for trusting me with your gastrointestinal care!    Silvano Rusk, MD, [facility]

## 2021-01-04 NOTE — Progress Notes (Signed)
Lindsay Clark 80 y.o. 1940/05/11 [id number]  Assessment & Plan:   Encounter Diagnoses  Name Primary?   Small intestinal bacterial overgrowth (SIBO) Yes   Flatulence, eructation and gas pain    Fecal smearing     Meds ordered this encounter  Medications   rifaximin (XIFAXAN) 550 MG TABS tablet    Sig: Take 1 tablet (550 mg total) by mouth 3 (three) times daily.    Dispense:  42 tablet    Refill:  0   If she fails Xifaxan I plan to try empiric Creon 30 6K2 with meals 1 with snacks  We had a conversation about processed foods in the diet.  She eats cereal every day and I recommended against that.  Handout provided on trying to reduce added sugars in diet and reducing processed foods.  Could be a bit of a challenge at [facility] on the food there but still she looks like a metabolic syndrome person with her obesity and body habitus and this type of processed food diet is also linked to SIBO and would be beneficial to reduce processed foods and added sugars.  CC: [name], MD  Subjective:   Chief Complaint: SIBO, fecal smearing and flatulence issues  HPI 80 year old white woman who has had problem with fecal smearing and "leakage of gas" refractory to banding of hemorrhoids.  More recently earlier this year I had her do some testing and her fecal elastase test showed a low level and she had a normal calprotectin.  She did a SIBO test which was positive.  We recommended treatment with Xifaxan that got a prior authorization and prescribed that but to the best of our knowledge it does not look like she ever took it.  She moved this year moved to [facility] right around the time we prescribed that, she also had an A. fib ablation her husband has had health problems so she has been very busy.  Her symptoms are exactly the same.   Lactulose hydrogen breath test abnormal October 11 2227 ppm increase hydrogen 8 ppm increase methane 37 ppm increase total  Colonoscopy November 2017  with 2 diminutive polyps one was an adenoma and one was a mucosal polyp otherwise entirely normal  Wt Readings from Last 3 Encounters:  01/04/21 211 lb (95.7 kg)  10/04/20 210 lb 6.4 oz (95.4 kg)  10/03/20 211 lb 6.4 oz (95.9 kg)    Allergies  Allergen Reactions   Diltiazem Swelling    Bilateral feet   Methotrexate     Other reaction(s): swelling in ankles   Metronidazole     Other reaction(s): leg swelling   Other Other (See Comments)    Medication:Methychloroisothlazolinone/methylisothiazilinoneCL +Me-Isothiazolinone Reaction: unknown-patch testing Medication:glyceryl thioglycolate  Reaction:unknown-patch testing   Quaternium-15 Other (See Comments)    unknown reaction-patching testing   Current Meds  Medication Sig   ELIQUIS 5 MG TABS tablet TAKE 1 TABLET BY MOUTH TWICE DAILY.   furosemide (LASIX) 40 MG tablet Take 2 tablets (80 mg total) by mouth daily. (Patient taking differently: Take 80 mg by mouth in the morning.)   hydroxychloroquine (PLAQUENIL) 200 MG tablet Take 200 mg by mouth 2 (two) times daily.   hydrOXYzine (ATARAX/VISTARIL) 10 MG tablet Take 1 tablet by mouth daily.   metoprolol succinate (TOPROL-XL) 50 MG 24 hr tablet Take 1 tablet (50 mg total) by mouth 2 (two) times daily.   Multiple Vitamin (MULTIVITAMIN WITH MINERALS) TABS tablet Take 1 tablet by mouth in the morning. One-A-Day for Women  50+   potassium chloride (KLOR-CON) 10 MEQ tablet Take 20 mEq by mouth daily with breakfast.   Propylene Glycol 0.6 % SOLN Place 1 drop into both eyes at bedtime.   rifaximin (XIFAXAN) 550 MG TABS tablet Take 1 tablet (550 mg total) by mouth 3 (three) times daily.   Past Medical History:  Diagnosis Date   Atherosclerosis    mild cerebral   Atrial fibrillation and flutter (HCC)    Autoimmune disorder (HCC)    Autoimmune encephalitis    Cervical disc disease    with spondylosis   Cervical spondylosis    Chronic dermatitis    eczematous   Colitis    COVID 10/2020    DDD (degenerative disc disease), lumbar    Frozen shoulder    left   Hyperlipidemia    Hypertension    IBS (irritable bowel syndrome)    with diarrhea   Lateral epicondylitis    Lumbar spondylosis    Lupus (HCC)    Movement disorder    Multiple lesions on CT of brain and spine    reports lesions from previous CT scan at [facility]    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   PONV (postoperative nausea and vomiting)    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   Torn meniscus    left   Past Surgical History:  Procedure Laterality Date   CATARACT EXTRACTION     COLONOSCOPY     EYE SURGERY Left    HEMORRHOID BANDING     KNEE ARTHROSCOPY Left 08/09/2020   Procedure: LEFT KNEE ARTHROSCOPY MEDIAL AND LATERAL MENISECTONMY AND CONDROPLASTY;  Surgeon: Melrose Nakayama, MD;  [facility]: [facility];  Service: Orthopedics;  Laterality: Left;   LOOP RECORDER INSERTION N/A 07/05/2017   Procedure: LOOP RECORDER INSERTION;  Surgeon: Thompson Grayer, MD;  Location: [place] [facility];  Service: Cardiovascular;  Laterality: N/A;   TUBAL LIGATION  1980   WISDOM TOOTH EXTRACTION     Social History   Social History Narrative   Patient lives at home with her spouse.   Caffeine Use: 2-3 cups daily   family history includes Cancer (age of onset: 68) in her maternal grandmother; Cancer (age of onset: 37) in her mother; Colon cancer (age of onset: 28) in her mother; Emphysema in her father; Heart attack (age of onset: 28) in her father; Stroke in her mother; Thrombosis in her maternal grandfather; Thyroid disease in her maternal grandmother and son.   Review of Systems As above  Objective:   Physical Exam BP 110/70   Pulse 70   Ht '5\' 4"'$  (1.626 m)   Wt 211 lb (95.7 kg)   BMI 36.22 kg/m  32 minutes total time spent on this visit and chart preparation patient interview and post visit orders and charting

## 2021-01-06 DIAGNOSIS — R946 Abnormal results of thyroid function studies: Secondary | ICD-10-CM | POA: Diagnosis not present

## 2021-01-09 ENCOUNTER — Ambulatory Visit (HOSPITAL_BASED_OUTPATIENT_CLINIC_OR_DEPARTMENT_OTHER)
Admission: RE | Admit: 2021-01-09 | Discharge: 2021-01-09 | Disposition: A | Discharge status: Home / Self Care | Payer: Medicare PPO | Source: Ambulatory Visit | Attending: Obstetrics & Gynecology | Admitting: Obstetrics & Gynecology

## 2021-01-09 ENCOUNTER — Ambulatory Visit (HOSPITAL_BASED_OUTPATIENT_CLINIC_OR_DEPARTMENT_OTHER): Payer: Medicare PPO | Admitting: Obstetrics & Gynecology

## 2021-01-09 ENCOUNTER — Other Ambulatory Visit: Payer: Self-pay

## 2021-01-09 ENCOUNTER — Encounter (HOSPITAL_BASED_OUTPATIENT_CLINIC_OR_DEPARTMENT_OTHER): Payer: Self-pay | Admitting: Obstetrics & Gynecology

## 2021-01-09 VITALS — BP 138/69 | HR 74 | Wt 213.0 lb

## 2021-01-09 DIAGNOSIS — Z1231 Encounter for screening mammogram for malignant neoplasm of breast: Secondary | ICD-10-CM | POA: Insufficient documentation

## 2021-01-09 DIAGNOSIS — L932 Other local lupus erythematosus: Secondary | ICD-10-CM | POA: Diagnosis not present

## 2021-01-09 DIAGNOSIS — Z79899 Other long term (current) drug therapy: Secondary | ICD-10-CM | POA: Diagnosis not present

## 2021-01-09 DIAGNOSIS — E2839 Other primary ovarian failure: Secondary | ICD-10-CM

## 2021-01-09 DIAGNOSIS — N898 Other specified noninflammatory disorders of vagina: Secondary | ICD-10-CM | POA: Diagnosis not present

## 2021-01-09 NOTE — Patient Instructions (Signed)
Revaree vaginal moisturizer  Replens vaginal moisturizer

## 2021-01-09 NOTE — Progress Notes (Signed)
GYNECOLOGY  VISIT  CC:   atrophic vaginitis follow up  HPI: 80 y.o. G2P2 Married White or Caucasian female here for follow up of vaginal discharge and odor.  Pt has not used any product that was recommended.  States she actually doubts my diagnosis.  Causes of vaginal discharge reviewed.  STD unlikely with her.  No foreign body present.  BV and yeast testing was negative.  That leaves atrophic vaginitis as cause.    I do not think she should be on HRT so other options were discussed.  Vit E suppository or cream recommended.  She declined getting this at [place] (because this location compounds for animals).  She declines getting this at another compounding pharmacy that involves any driving (although I did call pharmacy in [place] and it could be compounded there).  Lastly, she refuses to order anything from [place] because it has contributed to the degradation of locally owned businesses.  OTC options discussed including [facility] and [facility].  I am unsure of local places that have these or can order these.  These are available on line but she would prefer local pharmacy.    Had trip to [place] and [place] scheduled but had to reschedule this.  Is going to October 1st.  She is going to be able to hear an Programmer, applications and here the [employer] while they were there.    GYNECOLOGIC HISTORY: No LMP recorded. Patient is postmenopausal. Menopausal hormone therapy: none  Patient Active Problem List   Diagnosis Date Noted   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   Left knee pain 08/09/2020   Persistent atrial fibrillation ([place]) 04/05/2020   Secondary hypercoagulable state ([place]) 04/05/2020   Primary osteoarthritis of left knee 09/02/2019   Prolapsed internal hemorrhoids, grade 2 03/03/2019   Brain lesion 06/24/2017   Osteoarthritis    Multiple lesions on CT of brain and spine    Movement disorder    Lumbar spondylosis    Hyperlipidemia    DDD  (degenerative disc disease), lumbar    Dermatitis    Cervical spondylosis    Cervical disc disease    Diastolic dysfunction 0000000   Syncope 04/18/2016   Anxiety 04/18/2016   Hypertension    Autoimmune disorder-autoimmune encephalitis    Dystonia 03/16/2015   Coronary artery calcification of native artery 11/04/2014   Abnormal finding on MRI of brain 10/04/2014   Pruritus [id number]   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    Past Medical History:  Diagnosis Date   Atherosclerosis    mild cerebral   Atrial fibrillation and flutter (HCC)    Autoimmune disorder (HCC)    Autoimmune encephalitis    Cervical disc disease    with spondylosis   Cervical spondylosis    Chronic dermatitis    eczematous   Colitis    COVID 10/2020   DDD (degenerative disc disease), lumbar    Frozen shoulder    left   Hyperlipidemia    Hypertension    IBS (irritable bowel syndrome)    with diarrhea   Lateral epicondylitis    Lumbar spondylosis    Lupus (HCC)    Movement disorder    Multiple lesions on CT of brain and spine    reports lesions from previous CT scan at [facility]    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   PONV (postoperative nausea and vomiting)    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   Torn meniscus    left  Past Surgical History:  Procedure Laterality Date   CATARACT EXTRACTION     COLONOSCOPY     EYE SURGERY Left    HEMORRHOID BANDING     KNEE ARTHROSCOPY Left 08/09/2020   Procedure: LEFT KNEE ARTHROSCOPY MEDIAL AND LATERAL [place] AND CONDROPLASTY;  Surgeon: Melrose Nakayama, MD;  Location: [facility];  Service: Orthopedics;  Laterality: Left;   LOOP RECORDER INSERTION N/A 07/05/2017   Procedure: LOOP RECORDER INSERTION;  Surgeon: Thompson Grayer, MD;  Location: [facility];  Service: Cardiovascular;  Laterality: N/A;   TUBAL LIGATION  1980   WISDOM TOOTH EXTRACTION      MEDS:   Current Outpatient Medications on File Prior to Visit  Medication Sig  Dispense Refill   ELIQUIS 5 MG TABS tablet TAKE 1 TABLET BY MOUTH TWICE DAILY. 60 tablet 5   furosemide (LASIX) 40 MG tablet Take 2 tablets (80 mg total) by mouth daily. (Patient taking differently: Take 80 mg by mouth in the morning.) 180 tablet 3   hydroxychloroquine (PLAQUENIL) 200 MG tablet Take 200 mg by mouth 2 (two) times daily.     hydrOXYzine (ATARAX/VISTARIL) 10 MG tablet Take 1 tablet by mouth daily.     metoprolol succinate (TOPROL-XL) 50 MG 24 hr tablet Take 1 tablet (50 mg total) by mouth 2 (two) times daily. 180 tablet 3   Multiple Vitamin (MULTIVITAMIN WITH MINERALS) TABS tablet Take 1 tablet by mouth in the morning. One-A-Day for Women 50+     potassium chloride (KLOR-CON) 10 MEQ tablet Take 20 mEq by mouth daily with breakfast.     Propylene Glycol 0.6 % SOLN Place 1 drop into both eyes at bedtime.     rifaximin (XIFAXAN) 550 MG TABS tablet Take 1 tablet (550 mg total) by mouth 3 (three) times daily. 42 tablet 0   No current facility-administered medications on file prior to visit.    ALLERGIES: Diltiazem, Methotrexate, Metronidazole, Other, and Quaternium-15  Family History  Problem Relation Age of Onset   Cancer Mother 71       colon cancer   Stroke Mother    Colon cancer Mother 63   Heart attack Father 37   Emphysema Father    Cancer Maternal Grandmother 89       uterine   Thyroid disease Maternal Grandmother    Thrombosis Maternal Grandfather    Thyroid disease Son     SH:  married, non smoker  Review of Systems  Genitourinary: Negative.    PHYSICAL EXAMINATION:    BP 138/69 (BP Location: Left Arm, Patient Position: Sitting, Cuff Size: Large)   Pulse 74   Wt 213 lb (96.6 kg)   BMI 36.56 kg/m     Physical Exam Constitutional:      Appearance: Normal appearance.  Neurological:     General: No focal deficit present.     Mental Status: She is alert.  Psychiatric:        Behavior: Behavior normal.   Pt asks about physical exam.  As no treatment has  been tried, symptoms are not worse, and pelvic exam performed since 6/14 do not feel repeat pelvic exam indicated today.  Assessment/Plan: 1. Vaginal discharge - OTC options for treatment reviewed.  Pt is welcome to follow up if decides to try a treatment option.  If not, doesn't need any specific follow up at this time.  2. Encounter for screening mammogram for malignant neoplasm of breast - pt aware MMG due - MM 3D SCREEN BREAST BILATERAL; Future

## 2021-01-19 ENCOUNTER — Encounter: Payer: Medicare PPO | Attending: Physical Medicine & Rehabilitation | Admitting: Physical Medicine & Rehabilitation

## 2021-01-19 ENCOUNTER — Other Ambulatory Visit: Payer: Self-pay

## 2021-01-19 ENCOUNTER — Encounter: Payer: Self-pay | Admitting: Physical Medicine & Rehabilitation

## 2021-01-19 VITALS — BP 112/66 | HR 76 | Temp 98.8°F | Wt 212.0 lb

## 2021-01-19 DIAGNOSIS — G249 Dystonia, unspecified: Secondary | ICD-10-CM | POA: Diagnosis not present

## 2021-01-19 DIAGNOSIS — M170 Bilateral primary osteoarthritis of knee: Secondary | ICD-10-CM

## 2021-01-19 NOTE — Progress Notes (Deleted)
a 

## 2021-01-19 NOTE — Patient Instructions (Signed)
Consider Botox to Right tibialis posterior muscle

## 2021-01-19 NOTE — Progress Notes (Signed)
Subjective:    Patient ID: Lindsay Clark, female    DOB: Dec 19, 1940, 80 y.o.   MRN: [id number]  HPI CC:  Dystonia 80 year old female with past medical history of atrial fibrillation, lumbar spondylosis, cervical spondylosis, and autoimmune encephalitis kindly referred by Dr. Nedra Hai for physical medicine and rehabilitation consultation. The patient has had abnormal movements in the right hemibody since 2015.  Imaging studies demonstrated abnormalities in the left basal ganglia.  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  No progressive symptoms no active inflammation.  Patient has had weakness as well as abnormal movements of the foot and ankle on the right side more so than in the hand. In terms of joint pain she notes bilateral knee pain as well as some ankle pain but this is not severe.  She mostly notes that she has abnormal joints.  She follows with Dr. Ninfa Linden from orthopedics who does intermittent knee injections last performed 12/05/2020.  She feels that at some point she will require a total knee replacement.  She did have meniscal repair left knee in April 2022  Problem with standing   Hand numbness 4th and 5th   Mod I with all self care and mobility , In [place] home at [facility]   Has had a fall at [facility], no sig injury   Has tried physical therapy but not for therapy  No pain c/os  The patient would like to know what she should be doing to help improve or at least maintain her functional status.  She is not looking to do any type of athletics. She does enjoy the water.  [facility]  Pain Inventory Average Pain 0 Pain Right Now 0 My pain is  Numbness in the right hand, joint dysfunction & right foot dystonia  In the last 24 hours, has pain interfered with the following? General activity 0 Relation with others 0 Enjoyment of life 0 What  TIME of day is your pain at its worst? No pain Sleep (in general)  No problem  Pain is worse with:  No pain Pain improves with:  No pain Relief from Meds:  N/A  Mobility Walk without assistance Climbs steps  Function Retired Need Assistance with household duties  Neuro / Psych Weakness Numbness  Family History  Problem Relation Age of Onset   Cancer Mother 3       colon cancer   Stroke Mother    Colon cancer Mother 77   Heart attack Father 11   Emphysema Father    Cancer Maternal Grandmother 76       uterine   Thyroid disease Maternal Grandmother    Thrombosis Maternal Grandfather    Thyroid disease Son    Social History   Socioeconomic History   Marital status: Married    Spouse name: [name]   Number of children: 2   Years of education: MAx2   Highest education level: Not on file  Occupational History   Occupation: Retired    Fish farm manager: OTHER  Tobacco Use   Smoking status: Never   Smokeless tobacco: Never  Vaping Use   Vaping Use: Never used  Substance and Sexual Activity   Alcohol use: Yes    Alcohol/week: 4.0 standard drinks    Types: 4 Glasses of wine per week    Comment: 4 glasses of wine weekly   Drug use: No   Sexual activity: Not  Currently  Other Topics Concern   Not on file  Social History Narrative   Patient lives at home with her spouse.   Caffeine Use: 2-3 cups daily   Social Determinants of Health   Financial Resource Strain: Not on file  Food Insecurity: Not on file  Transportation Needs: Not on file  Physical Activity: Not on file  Stress: Not on file  Social Connections: Not on file   Past Surgical History:  Procedure Laterality Date   CATARACT EXTRACTION     COLONOSCOPY     EYE SURGERY Left    HEMORRHOID BANDING     KNEE ARTHROSCOPY Left 08/09/2020   Procedure: LEFT KNEE ARTHROSCOPY MEDIAL AND [place];  Surgeon: Melrose Nakayama, MD;  Location[facility];  Service: Orthopedics;  Laterality: Left;    LOOP RECORDER INSERTION N/A 07/05/2017   Procedure: LOOP RECORDER INSERTION;  Surgeon: Thompson Grayer, MD;  Location: [facility];  Service: Cardiovascular;  Laterality: N/A;   TUBAL LIGATION  1980   WISDOM TOOTH EXTRACTION     Past Surgical History:  Procedure Laterality Date   CATARACT EXTRACTION     COLONOSCOPY     EYE SURGERY Left    HEMORRHOID BANDING     KNEE ARTHROSCOPY Left 08/09/2020   Procedure: LEFT KNEE ARTHROSCOPY MEDIAL AND LATERAL [place] AND CONDROPLASTY;  Surgeon: Melrose Nakayama, MD;  [facility];  Service: Orthopedics;  Laterality: Left;   LOOP RECORDER INSERTION N/A 07/05/2017   Procedure: LOOP RECORDER INSERTION;  Surgeon: Thompson Grayer, MD;  Location: [facility];  Service: Cardiovascular;  Laterality: N/A;   TUBAL LIGATION  1980   WISDOM TOOTH EXTRACTION     Past Medical History:  Diagnosis Date   Atherosclerosis    mild cerebral   Atrial fibrillation and flutter (HCC)    Autoimmune disorder (HCC)    Autoimmune encephalitis    Cervical disc disease    with spondylosis   Cervical spondylosis    Chronic dermatitis    eczematous   Colitis    COVID 10/2020   DDD (degenerative disc disease), lumbar    Frozen shoulder    left   Hyperlipidemia    Hypertension    IBS (irritable bowel syndrome)    with diarrhea   Lateral epicondylitis    Lumbar spondylosis    Lupus (HCC)    Movement disorder    Multiple lesions on CT of brain and spine    reports lesions from [facility] CT scan [facility]    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   PONV (postoperative nausea and vomiting)    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   Torn meniscus    left   BP 112/66   Pulse 76   Temp 98.8 F (37.1 C)   Wt 212 lb (96.2 kg)   SpO2 96%   BMI 36.39 kg/m   Opioid Risk Score:   Fall Risk Score:  `1  Depression screen PHQ 2/9  Depression screen [facility] 2/9 01/09/2021 10/04/2020  Decreased Interest 0 0  Down, Depressed, Hopeless 0 0  PHQ - 2  Score 0 0  Some recent data might be hidden    Review of Systems  Musculoskeletal:  Positive for gait problem.       Right foot dystonia Limb swelling    Neurological:  Positive for numbness (right hand).  All other systems reviewed and are negative.     Objective:   Physical Exam Vitals and  nursing note reviewed.  Constitutional:      Appearance: She is obese.  HENT:     Head: Normocephalic and atraumatic.     Mouth/Throat:     Mouth: Mucous membranes are dry.  Eyes:     Extraocular Movements: Extraocular movements intact.     Conjunctiva/sclera: Conjunctivae normal.     Pupils: Pupils are equal, round, and reactive to light.  Neck:     Comments: Mildly limited cervical spine range of motion with rotation to the right and to the left as well as extension greater than with flexion Cardiovascular:     Rate and Rhythm: Normal rate and regular rhythm.     Pulses: Normal pulses.     Heart sounds: Normal heart sounds.  Pulmonary:     Effort: Pulmonary effort is normal. No respiratory distress.     Breath sounds: Normal breath sounds. No stridor.  Abdominal:     General: Abdomen is flat. Bowel sounds are normal. There is no distension.     Palpations: Abdomen is soft. There is no mass.     Tenderness: There is no abdominal tenderness.  Musculoskeletal:     Comments: Full range of motion bilateral shoulders as well as elbows wrist and hands.  Mildly reduced left hip internal extra rotation compared to the right side.  Knee range of motion is full there is valgus deformity bilateral knees and bilateral ankles Mild tenderness palpation along the ankle joint lines bilaterally.  Skin:    General: Skin is warm and dry.  Neurological:     General: No focal deficit present.     Mental Status: She is alert and oriented to person, place, and time.     Cranial Nerves: No dysarthria.     Sensory: Sensation is intact.     Motor: Weakness and tremor present.     Coordination:  Coordination abnormal. Impaired rapid alternating movements.     Gait: Gait abnormal.     Comments: Motor strength is 5/5 left deltoid, bicep, tricep grip 4+/5 in the right deltoid, bicep, tricep, grip 5/5 left hip flexor knee extensor ankle dorsiflexor 4+ in the right hip flexion right knee extensor right ankle dorsiflexor Sensation intact light touch bilateral upper and lower extremities. The patient has mild athetosis right upper extremity mainly in the hand and wrist area.  In the right lower extremity the patient has foot supination dystonia which is intermittent.  Her right lateral aspect of the shoe is worn down.  She states that this actually improves with walking and is worse with sitting.  There is no evidence of spasticity Extremities.  No clonus at the ankles.  There is mild dysdiadochokinesis with rapid supination pronation of the right upper extremity left side which is mild.  Psychiatric:        Mood and Affect: Mood normal.        Behavior: Behavior normal.          Assessment & Plan:  1.  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  Most significant muscle group affected is the right posterior tibialis.  She has undergone physical therapy.  No significant relief.  We discussed botulinum toxin injection , we discussed onset of action and usual duration of action.  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 She would want to pursue this, will recommend  75 units to the right posterior tibialis.  We discussed that if no significant relief was obtained with this injection it would not be repeated however she had partial relief it could be repeated and possibly dosing and muscle groups selections may need to be altered. 2.  Patient concerns about slow functional decline.  In addition to her neurologic disorder she does have bilateral  osteoarthritis of the knees.  We discussed that regular exercise program would be the best way to avoid rapid decline.  We will make referral to aquatic therapy for therapeutic exercise as well as instruction to help with general strengthening.

## 2021-01-24 ENCOUNTER — Telehealth: Payer: Self-pay

## 2021-01-24 NOTE — Telephone Encounter (Signed)
Carelink alert received for Linq at RRT 01/18/21. Unsuccessful telephone encounter to patient to discuss. Hipaa compliant VM message left requesting call back to [phone]. Remote appointments discontinued, patient marked inactive in paceart, discontinued in carlink.

## 2021-01-26 NOTE — Telephone Encounter (Signed)
2nd attempted to reach patient, no answer.  LVM with device clinic # and hours   Also attempted to reach patient at alternate # provided on DPR, no answer, identified VM for patient.  LVM with device clinic # and hours.

## 2021-01-27 NOTE — Telephone Encounter (Signed)
Patient returning call. Discussed RRT status and options for removal or leaving in place. Patient request another device implant. Informed patient that typically loops are utilized for diagnostic purposes and we do not reimplant. Patient states she will discuss with Dr. [name]. She is currently in [place]. Informed patient she can unplug monitor. Informed patient we would be happy to schedule removal of device once she returns home and has an opportunity to discuss with [place].

## 2021-02-07 DIAGNOSIS — Z7901 Long term (current) use of anticoagulants: Secondary | ICD-10-CM | POA: Diagnosis not present

## 2021-02-07 DIAGNOSIS — R9431 Abnormal electrocardiogram [ECG] [EKG]: Secondary | ICD-10-CM | POA: Diagnosis not present

## 2021-02-07 DIAGNOSIS — Z79899 Other long term (current) drug therapy: Secondary | ICD-10-CM | POA: Diagnosis not present

## 2021-02-07 DIAGNOSIS — I10 Essential (primary) hypertension: Secondary | ICD-10-CM | POA: Diagnosis not present

## 2021-02-07 DIAGNOSIS — E785 Hyperlipidemia, unspecified: Secondary | ICD-10-CM | POA: Diagnosis not present

## 2021-02-07 DIAGNOSIS — I4811 Longstanding persistent atrial fibrillation: Secondary | ICD-10-CM | POA: Diagnosis not present

## 2021-02-08 ENCOUNTER — Ambulatory Visit (HOSPITAL_BASED_OUTPATIENT_CLINIC_OR_DEPARTMENT_OTHER): Payer: Medicare PPO | Attending: Physical Medicine & Rehabilitation | Admitting: Physical Therapy

## 2021-02-08 ENCOUNTER — Other Ambulatory Visit: Payer: Self-pay

## 2021-02-08 ENCOUNTER — Encounter (HOSPITAL_BASED_OUTPATIENT_CLINIC_OR_DEPARTMENT_OTHER): Payer: Self-pay | Admitting: Physical Therapy

## 2021-02-08 DIAGNOSIS — G8929 Other chronic pain: Secondary | ICD-10-CM | POA: Insufficient documentation

## 2021-02-08 DIAGNOSIS — M25662 Stiffness of left knee, not elsewhere classified: Secondary | ICD-10-CM | POA: Diagnosis not present

## 2021-02-08 DIAGNOSIS — I4811 Longstanding persistent atrial fibrillation: Secondary | ICD-10-CM | POA: Diagnosis not present

## 2021-02-08 DIAGNOSIS — M25561 Pain in right knee: Secondary | ICD-10-CM | POA: Diagnosis not present

## 2021-02-08 DIAGNOSIS — M6281 Muscle weakness (generalized): Secondary | ICD-10-CM | POA: Diagnosis not present

## 2021-02-08 DIAGNOSIS — M25562 Pain in left knee: Secondary | ICD-10-CM | POA: Diagnosis not present

## 2021-02-08 DIAGNOSIS — R262 Difficulty in walking, not elsewhere classified: Secondary | ICD-10-CM | POA: Diagnosis not present
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
## 2021-02-09 ENCOUNTER — Ambulatory Visit: Payer: Medicare PPO | Admitting: Physical Medicine & Rehabilitation

## 2021-02-27 ENCOUNTER — Ambulatory Visit (HOSPITAL_BASED_OUTPATIENT_CLINIC_OR_DEPARTMENT_OTHER): Payer: Medicare PPO | Attending: Physical Medicine & Rehabilitation | Admitting: Physical Therapy

## 2021-02-27 DIAGNOSIS — R262 Difficulty in walking, not elsewhere classified: Secondary | ICD-10-CM | POA: Insufficient documentation

## 2021-02-27 DIAGNOSIS — M25562 Pain in left knee: Secondary | ICD-10-CM | POA: Insufficient documentation

## 2021-02-27 DIAGNOSIS — M6281 Muscle weakness (generalized): Secondary | ICD-10-CM | POA: Insufficient documentation

## 2021-02-27 DIAGNOSIS — M25662 Stiffness of left knee, not elsewhere classified: Secondary | ICD-10-CM | POA: Insufficient documentation

## 2021-02-27 DIAGNOSIS — M25561 Pain in right knee: Secondary | ICD-10-CM | POA: Insufficient documentation

## 2021-02-27 DIAGNOSIS — G8929 Other chronic pain: Secondary | ICD-10-CM | POA: Insufficient documentation

## 2021-02-27 NOTE — Therapy (Incomplete)
OUTPATIENT PHYSICAL THERAPY TREATMENT NOTE   Patient Name: Lindsay Clark MRN: [id number] DOB:06-Jan-1941, 80 y.o., female Today's Date: 02/27/2021  PCP: [name], MD REFERRING PROVIDER: [name], MD    Past Medical History:  Diagnosis Date   Atherosclerosis    mild cerebral   Atrial fibrillation and flutter ([place])    Autoimmune disorder ([place])    Autoimmune encephalitis    Cervical disc disease    with spondylosis   Cervical spondylosis    Chronic dermatitis    eczematous   Colitis    COVID 10/2020   DDD (degenerative disc disease), lumbar    Frozen shoulder    left   Hyperlipidemia    Hypertension    IBS (irritable bowel syndrome)    with diarrhea   Lateral epicondylitis    Lumbar spondylosis    Lupus (HCC)    Movement disorder    Multiple lesions on CT of brain and spine    reports lesions from previous CT scan at [facility]    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   PONV (postoperative nausea and vomiting)    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   Torn meniscus    left   Past Surgical History:  Procedure Laterality Date   CATARACT EXTRACTION     COLONOSCOPY     EYE SURGERY Left    HEMORRHOID BANDING     KNEE ARTHROSCOPY Left 08/09/2020   Procedure: LEFT KNEE ARTHROSCOPY MEDIAL AND LATERAL MENISECTONMY AND CONDROPLASTY;  Surgeon: Melrose Nakayama, MD;  Location: [facility];  Service: Orthopedics;  Laterality: Left;   LOOP RECORDER INSERTION N/A 07/05/2017   Procedure: LOOP RECORDER INSERTION;  Surgeon: Thompson Grayer, MD;  Location: [facility];  Service: Cardiovascular;  Laterality: N/A;   TUBAL LIGATION  1980   WISDOM TOOTH EXTRACTION     Patient Active Problem List   Diagnosis Date Noted   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   Left knee pain 08/09/2020   Persistent atrial fibrillation ([place]) 04/05/2020   Secondary hypercoagulable state ([place]) 04/05/2020   Primary osteoarthritis of left knee 09/02/2019   Prolapsed  internal hemorrhoids, grade 2 03/03/2019   Brain lesion 06/24/2017   Osteoarthritis    Multiple lesions on CT of brain and spine    Movement disorder    Lumbar spondylosis    Hyperlipidemia    DDD (degenerative disc disease), lumbar    Dermatitis    Cervical spondylosis    Cervical disc disease    Diastolic dysfunction 13/11/6576   Syncope 04/18/2016   Anxiety 04/18/2016   Hypertension    Autoimmune disorder-autoimmune encephalitis    Dystonia 03/16/2015   Coronary artery calcification of native artery 11/04/2014   Abnormal finding on MRI of brain 10/04/2014   Pruritus 46/96/2952   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    REFERRING DIAG: G24.9 (ICD-10-CM) - Dystonia M17.0 (ICD-10-CM) - Bilateral primary osteoarthritis of knee     THERAPY DIAG:  No diagnosis found.  PERTINENT HISTORY: -atrial fibrillation, lumbar spondylosis, cervical spondylosis, and autoimmune encephalitis    -"abnormal movements in the right hemibody since 2015" (portion of basal ganglia- inactivity)   -L knee meniscal repair  April 2022    SUBJECTIVE: ***  PAIN:  Are you having pain? {yes/no:20286} VAS scale: ***/10 Pain location: *** Pain orientation: {Pain Orientation:25161}  PAIN TYPE: {type:313116} Pain description: {PAIN DESCRIPTION:21022940}  Aggravating factors: *** Relieving factors: ***    OBJECTIVE:   MR LEFT KNEE IMPRESSION: 1. Maceration of the  lateral meniscus posterior body extending towards the posterior horn. Additional tiny longitudinal undersurface tear of the anterior horn. 2. Tricompartmental osteoarthritis, moderate in the lateral compartment. 3. Moderate joint effusion.  TODAY'S TREATMENT: TODAY'S TREATMENT:   Exercises Seated HS stretch 30s 2x STS no UE 2x10 Seated LAQ with 5lb ankle weights 2x10 Standing HS curl 5lb 2x10 Sidestepping at wall 57ft x2 RTB at knees Standing calf raise 2x10 Tandem walking at all 71ft x2     PATIENT EDUCATION:  Education  details: diagnosis, prognosis, anatomy, exercise progression, DOMS expectations, muscle firing,  envelope of function, HEP, POC   Person educated: Patient Education method: Explanation, Demonstration, Tactile cues, Verbal cues, and Handouts Education comprehension: verbalized understanding, returned demonstration, verbal cues required, and tactile cues required     HOME EXERCISE PROGRAM: Access Code: [email or web address] URL: [email or web address] Date: 02/08/2021 Prepared by: Daleen Bo     ASSESSMENT:    CLINICAL IMPRESSION: Patient is a 80 y.o. female who was seen today for physical therapy evaluation and treatment for L knee pain and general deconditioning. Pt s/s appear consistent with history of L knee internal derangement and OA. Pt does not appear to be a falls risk and shows good functional mobility despite history of R sided dystonia. Pt does present with expected gait deviation and L weakness but did not test as a falls risk here in clinic today. Pt to participate with PT in order to set up land based and aquatic HEP and transition to personal training/independent exercise at [place]. Pt's functional deficits pertain mostly to general stamina/endurance and LE strength. Pt appears to have good understanding. Objective impairments include Abnormal gait, decreased activity tolerance, decreased balance, decreased endurance, decreased mobility, difficulty walking, decreased ROM, decreased strength, impaired flexibility, impaired tone, impaired UE functional use, improper body mechanics, postural dysfunction, obesity, and pain. These impairments are limiting patient from community activity and recreation, community mobility, and travel . Personal factors including Age, Behavior pattern, Past/current experiences, Time since onset of injury/illness/exacerbation, and 1-2 comorbidities:    are also affecting patient's functional outcome. Patient will benefit from skilled PT to address above  impairments and improve overall function.   REHAB POTENTIAL: Good   CLINICAL DECISION MAKING: Stable/uncomplicated   EVALUATION COMPLEXITY: Low     GOALS:     SHORT TERM GOALS:   STG Name Target Date Goal status  1 Pt will become independent with HEP in order to demonstrate synthesis of PT education.  Baseline:  02/22/2021 INITIAL  2 Pt will be able to demonstrate ability to perform tandem stance >30s in order to demonstrate functional improvement in NBOS for community ambulation and static balance.    03/01/2021 INITIAL  3 Pt will score at least 12 pt increase on FOTO to demonstrate functional improvement in MCII and pt perceived function.    03/01/2021 INITIAL    LONG TERM GOALS:    LTG Name Target Date Goal status  1 Pt  will become independent with final HEP in order to demonstrate synthesis of PT education.   03/22/2021 INITIAL  2 Pt will be able to perform 5XSTS in under 12s  in order to demonstrate functional improvement above the cut off score for adults.    03/22/2021 INITIAL  3 Pt will be able to demonstrate reciprocal stair pattern with UE support in order to demonstrate functional improvement in LE function for community ambulation and house hold duties.   03/22/2021 INITIAL  4 Pt will score >/= 53 on FOTO to demonstrate functional improvement in  L knee function at D/C.   03/22/2021 INITIAL    PLAN: PT FREQUENCY:  1x/week or Bi-weekly   PT DURATION: 6 weeks   PLANNED INTERVENTIONS: Therapeutic exercises, Therapeutic activity, Neuro Muscular re-education, Balance training, Gait training, Patient/Family education, Joint mobilization, Stair training, Aquatic Therapy, Dry Needling, Electrical stimulation, Spinal mobilization, Cryotherapy, Moist heat, Taping, Vasopneumatic device, Traction, Ultrasound, Ionotophoresis 4mg /ml Dexamethasone, and Manual therapy   PLAN FOR NEXT SESSION: review HEP, continue with L knee strength (BW or DB/bands), set up land HEP, edu about  aquatic visit/therapy    Daleen Bo PT, DPT 02/27/21 2:53 PM

## 2021-03-06 DIAGNOSIS — G249 Dystonia, unspecified: Secondary | ICD-10-CM | POA: Diagnosis not present

## 2021-03-07 ENCOUNTER — Other Ambulatory Visit: Payer: Self-pay

## 2021-03-07 ENCOUNTER — Encounter: Payer: Self-pay | Admitting: Orthopaedic Surgery

## 2021-03-07 ENCOUNTER — Ambulatory Visit: Payer: Medicare PPO | Admitting: Orthopaedic Surgery

## 2021-03-07 DIAGNOSIS — G8929 Other chronic pain: Secondary | ICD-10-CM

## 2021-03-07 DIAGNOSIS — M25562 Pain in left knee: Secondary | ICD-10-CM | POA: Diagnosis not present

## 2021-03-07 DIAGNOSIS — M1711 Unilateral primary osteoarthritis, right knee: Secondary | ICD-10-CM

## 2021-03-07 DIAGNOSIS — M25561 Pain in right knee: Secondary | ICD-10-CM | POA: Diagnosis not present

## 2021-03-07 DIAGNOSIS — M1712 Unilateral primary osteoarthritis, left knee: Secondary | ICD-10-CM

## 2021-03-07 MED ORDER — METHYLPREDNISOLONE ACETATE 40 MG/ML IJ SUSP
40.0000 mg | INTRAMUSCULAR | Status: AC | PRN
  Administered 2021-03-07: 40 mg via INTRA_ARTICULAR

## 2021-03-07 MED ORDER — LIDOCAINE HCL 1 % IJ SOLN
3.0000 mL | INTRAMUSCULAR | Status: AC | PRN
  Administered 2021-03-07: 3 mL

## 2021-03-07 MED ORDER — METHYLPREDNISOLONE ACETATE 40 MG/ML IJ SUSP
40.0000 mg | INTRAMUSCULAR | Status: AC | PRN
Start: 2021-03-07 — End: 2021-03-07
  Administered 2021-03-07: 40 mg via INTRA_ARTICULAR

## 2021-03-07 NOTE — Progress Notes (Signed)
Office Visit Note   Patient: Lindsay Clark           Date of Birth: April 02, 1941           MRN: [id number] Visit Date: 03/07/2021              Requested by: [name], MD [place] [place],  [place] [address] PCP: [name], MD   Assessment & Plan: Visit Diagnoses:  1. Chronic pain of left knee   2. Chronic pain of right knee   3. Unilateral primary osteoarthritis, left knee   4. Unilateral primary osteoarthritis, right knee     Plan: I did aspirate 20 cc of fluid from her left knee and place a steroid injection in both knees today.  We can repeat this in 3 months.  All question concerns were answered and addressed.  Follow-Up Instructions: Return in about 3 months (around 06/07/2021).   Orders:  No orders of the defined types were placed in this encounter.  No orders of the defined types were placed in this encounter.     Procedures: Large Joint Inj: R knee on 03/07/2021 11:05 AM Indications: diagnostic evaluation and pain Details: 22 G 1.5 in needle, superolateral approach  Arthrogram: No  Medications: 3 mL lidocaine 1 %; 40 mg methylPREDNISolone acetate 40 MG/ML Outcome: tolerated well, no immediate complications Procedure, treatment alternatives, risks and benefits explained, specific risks discussed. Consent was given by the patient. Immediately prior to procedure a time out was called to verify the correct patient, procedure, equipment, support staff and site/side marked as required. Patient was prepped and draped in the usual sterile fashion.    Large Joint Inj: L knee on 03/07/2021 11:05 AM Indications: diagnostic evaluation and pain Details: 22 G 1.5 in needle, superolateral approach  Arthrogram: No  Medications: 3 mL lidocaine 1 %; 40 mg methylPREDNISolone acetate 40 MG/ML Outcome: tolerated well, no immediate complications Procedure, treatment alternatives, risks and benefits explained, specific risks discussed. Consent was  given by the patient. Immediately prior to procedure a time out was called to verify the correct patient, procedure, equipment, support staff and site/side marked as required. Patient was prepped and draped in the usual sterile fashion.      Clinical Data: No additional findings.   Subjective: Chief Complaint  Patient presents with   Right Knee - Pain, Follow-up   Left Knee - Pain, Follow-up  The patient is well-known to me.  She has debilitating arthritis with her left knee and moderate of the right knee.  She is 80 years old and very active.  She comes in about every 3 months to consider aspirations of right knee and steroid injections in both knees.  The left knee does have a pulling sensation in the back of the knee and is full of fluid again.  She would like to have that aspirated but a steroid injection of both knees.  She wants to consider knee replacement surgery but not until probably June of next year because of her husband's request and goal to travel to all 50 states.  They have 2 more states to travel to I believe.  She has had no acute change in her medical status.  HPI  Review of Systems She currently denies any headache, chest pain, shortness of breath, fever, chills, nausea, vomiting  Objective: Vital Signs: There were no vitals taken for this visit.  Physical Exam She is alert and orient x3 and in no acute distress Ortho  Exam She is slow to mobilize.  Her left knee has a valgus malalignment and a mild effusion.  Her right knee has a more neutral alignment. Specialty Comments:  No specialty comments available.  Imaging: No results found.   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   Left knee pain 08/09/2020   Persistent atrial fibrillation ([place]) 04/05/2020   Secondary hypercoagulable state ([place]) 04/05/2020   Primary osteoarthritis of left knee 09/02/2019   Prolapsed internal  hemorrhoids, grade 2 03/03/2019   Brain lesion 06/24/2017   Osteoarthritis    Multiple lesions on CT of brain and spine    Movement disorder    Lumbar spondylosis    Hyperlipidemia    DDD (degenerative disc disease), lumbar    Dermatitis    Cervical spondylosis    Cervical disc disease    Diastolic dysfunction 44/81/8563   Syncope 04/18/2016   Anxiety 04/18/2016   Hypertension    Autoimmune disorder-autoimmune encephalitis    Dystonia 03/16/2015   Coronary artery calcification of native artery 11/04/2014   Abnormal finding on MRI of brain 10/04/2014   Pruritus 14/97/0263   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   Past Medical History:  Diagnosis Date   Atherosclerosis    mild cerebral   Atrial fibrillation and flutter (HCC)    Autoimmune disorder (HCC)    Autoimmune encephalitis    Cervical disc disease    with spondylosis   Cervical spondylosis    Chronic dermatitis    eczematous   Colitis    COVID 10/2020   DDD (degenerative disc disease), lumbar    Frozen shoulder    left   Hyperlipidemia    Hypertension    IBS (irritable bowel syndrome)    with diarrhea   Lateral epicondylitis    Lumbar spondylosis    Lupus (HCC)    Movement disorder    Multiple lesions on CT of brain and spine    reports lesions from previous CT scan at [facility]    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   PONV (postoperative nausea and vomiting)    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   Torn meniscus    left    Family History  Problem Relation Age of Onset   Cancer Mother 82       colon cancer   Stroke Mother    Colon cancer Mother 79   Heart attack Father 49   Emphysema Father    Cancer Maternal Grandmother 50       uterine   Thyroid disease Maternal Grandmother    Thrombosis Maternal Grandfather    Thyroid disease Son     Past Surgical History:  Procedure Laterality Date   CATARACT EXTRACTION     COLONOSCOPY     EYE SURGERY Left    HEMORRHOID BANDING     KNEE  ARTHROSCOPY Left 08/09/2020   Procedure: LEFT KNEE ARTHROSCOPY MEDIAL AND LATERAL MENISECTONMY AND CONDROPLASTY;  Surgeon: Melrose Nakayama, MD;  Location: [facility];  Service: Orthopedics;  Laterality: Left;   LOOP RECORDER INSERTION N/A 07/05/2017   Procedure: LOOP RECORDER INSERTION;  Surgeon: Thompson Grayer, MD;  Location: [facility];  Service: Cardiovascular;  Laterality: N/A;   TUBAL LIGATION  1980   WISDOM TOOTH EXTRACTION     Social History   Occupational History   Occupation: Retired    Fish farm manager: OTHER  Tobacco Use   Smoking status: Never  Smokeless tobacco: Never  Vaping Use   Vaping Use: Never used  Substance and Sexual Activity   Alcohol use: Yes    Alcohol/week: 4.0 standard drinks    Types: 4 Glasses of wine per week    Comment: 4 glasses of wine weekly   Drug use: No   Sexual activity: Not Currently

## 2021-03-09 ENCOUNTER — Ambulatory Visit: Payer: Medicare PPO | Admitting: Internal Medicine

## 2021-03-09 ENCOUNTER — Encounter: Payer: Self-pay | Admitting: Internal Medicine

## 2021-03-09 VITALS — BP 118/68 | HR 71 | Ht 64.0 in | Wt 215.0 lb

## 2021-03-09 DIAGNOSIS — R151 Fecal smearing: Secondary | ICD-10-CM | POA: Diagnosis not present

## 2021-03-09 DIAGNOSIS — R195 Other fecal abnormalities: Secondary | ICD-10-CM | POA: Diagnosis not present

## 2021-03-09 DIAGNOSIS — K6389 Other specified diseases of intestine: Secondary | ICD-10-CM | POA: Diagnosis not present

## 2021-03-09 MED ORDER — PANCRELIPASE (LIP-PROT-AMYL) 36000-114000 UNITS PO CPEP: 36000.0000 [IU] | ORAL_CAPSULE | Freq: Three times a day (TID) | ORAL | 0 refills | Status: DC

## 2021-03-09 MED ORDER — PANCRELIPASE (LIP-PROT-AMYL) 36000-114000 UNITS PO CPEP: ORAL_CAPSULE | ORAL | 0 refills | Status: DC

## 2021-03-09 NOTE — Patient Instructions (Signed)
We are providing you with creon samples today. Try these and let us know if it helps.  If you are age 78 or older, your body mass index should be between 23-30. Your Body mass index is 36.9 kg/m. If this is out of the aforementioned range listed, please consider follow up with your Primary Care Provider.  If you are age 74 or younger, your body mass index should be between 19-25. Your Body mass index is 36.9 kg/m. If this is out of the aformentioned range listed, please consider follow up with your Primary Care Provider.   ________________________________________________________  The [facility] providers would like to encourage you to use [facility] to communicate with providers for non-urgent requests or questions.  Due to long hold times on the telephone, sending your provider a message by [facility] may be a faster and more efficient way to get a response.  Please allow 48 business hours for a response.  Please remember that this is for non-urgent requests.  _______________________________________________________  I appreciate the opportunity to care for you. Silvano Rusk, MD, [facility]

## 2021-03-09 NOTE — Progress Notes (Signed)
Lindsay Clark 80 y.o. 26-Feb-1941 [id number]  Assessment & Plan:   Encounter Diagnoses  Name Primary?   Loose stools Yes   Fecal smearing    Small intestinal bacterial overgrowth (SIBO)     This has been challenging for her and me.  Transient benefit while traveling.  She relates that she did not change her diet at all during that time.  She seems a bit better overall but is still troubled by the loose stools and fecal leakage/smearing.  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  A trial of Creon 30 6K with meals.  Samples provided.  She will let me know if that helps.  If it does we will prescribe it.  If it does not we will have to think about this I am not sure what else we might do though I mention the idea of fiber supplementation to try to bulk up the stools she did not seem enthusiastic about that.  CC: Lindsay Stains, MD   Subjective:   Chief Complaint: Fecal smearing  HPI Lindsay Clark is a 80 year old woman who has been troubled by bloating loose stool with fecal leakage and leakage of flatus, and has a diagnosis of SIBO.  She was here in September and she thought she probably had not taken a course of Xifaxan that I prescribed.  In retrospect she thinks she did but I had prescribed her again and she took a second round.  She had been moving into [place] and there was understandable confusion about life events at that time and so she lost track of the fact that she had taken the [place], she thinks.  She and her husband took a trip after taking the second round of Xifaxan and during traveling she did not having any problem with fecal smearing or leakage or cleansing issues or loose stool.  However now that she has returned even though her bloating is better these other symptoms have returned.  We have attempted hemorrhoidal banding to treat this and it was not helpful.  Diminutive adenoma at colonoscopy in 2017.  She is now asking for something to "firm  up the stools".  Wt Readings from Last 3 Encounters:  03/09/21 215 lb (97.5 kg)  01/19/21 212 lb (96.2 kg)  01/09/21 213 lb (96.6 kg)    Allergies  Allergen Reactions   Diltiazem Swelling    Bilateral feet   Methotrexate     Other reaction(s): swelling in ankles   Metronidazole     Other reaction(s): leg swelling   Other Other (See Comments)    Medication:Methychloroisothlazolinone/methylisothiazilinoneCL +Me-Isothiazolinone Reaction: unknown-patch testing Medication:glyceryl thioglycolate  Reaction:unknown-patch testing   Quaternium-15 Other (See Comments)    unknown reaction-patching testing   Current Meds  Medication Sig   ELIQUIS 5 MG TABS tablet TAKE 1 TABLET BY MOUTH TWICE DAILY.   furosemide (LASIX) 40 MG tablet Take 2 tablets (80 mg total) by mouth daily. (Patient taking differently: Take 80 mg by mouth in the morning.)   hydroxychloroquine (PLAQUENIL) 200 MG tablet Take 200 mg by mouth 2 (two) times daily.   hydrOXYzine (ATARAX/VISTARIL) 10 MG tablet Take 1 tablet by mouth daily.   metoprolol succinate (TOPROL-XL) 50 MG 24 hr tablet Take 1 tablet (50 mg total) by mouth 2 (two) times daily.   Multiple Vitamin (MULTIVITAMIN WITH MINERALS) TABS tablet Take 1 tablet by mouth in the morning. One-A-Day for Women 50+   potassium chloride (KLOR-CON) 10 MEQ tablet Take 20 mEq by mouth daily  with breakfast.   Propylene Glycol 0.6 % SOLN Place 1 drop into both eyes at bedtime.   Past Medical History:  Diagnosis Date   Atherosclerosis    mild cerebral   Atrial fibrillation and flutter (HCC)    Autoimmune disorder (HCC)    Autoimmune encephalitis    Cervical disc disease    with spondylosis   Cervical spondylosis    Chronic dermatitis    eczematous   Colitis    COVID 10/2020   DDD (degenerative disc disease), lumbar    Frozen shoulder    left   Hyperlipidemia    Hypertension    IBS (irritable bowel syndrome)    with diarrhea   Lateral epicondylitis    Lumbar  spondylosis    Lupus (HCC)    Movement disorder    Multiple lesions on CT of brain and spine    reports lesions from previous CT scan at [facility]    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   PONV (postoperative nausea and vomiting)    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   Torn meniscus    left   Past Surgical History:  Procedure Laterality Date   A FLUTTER ABLATION  08/2020   [facility]   CATARACT EXTRACTION     COLONOSCOPY     EYE SURGERY Left    HEMORRHOID BANDING     KNEE ARTHROSCOPY Left 08/09/2020   Procedure: LEFT KNEE ARTHROSCOPY MEDIAL AND LATERAL MENISECTONMY AND CONDROPLASTY;  Surgeon: Melrose Nakayama, MD;  Location: [facility];  Service: Orthopedics;  Laterality: Left;   LOOP RECORDER INSERTION N/A 07/05/2017   Procedure: LOOP RECORDER INSERTION;  Surgeon: Thompson Grayer, MD;  Location: [facility];  Service: Cardiovascular;  Laterality: N/A;   TUBAL LIGATION  1980   WISDOM TOOTH EXTRACTION     Social History   Social History Narrative   Patient lives at home with her spouse.   Caffeine Use: 2-3 cups daily   family history includes Cancer (age of onset: 63) in her maternal grandmother; Cancer (age of onset: 46) in her mother; Colon cancer (age of onset: 97) in her mother; Emphysema in her father; Heart attack (age of onset: 65) in her father; Stroke in her mother; Thrombosis in her maternal grandfather; Thyroid disease in her maternal grandmother and son.   Review of Systems As per HPI  Objective:   Physical Exam BP 118/68   Pulse 71   Ht 5\' 4"  (1.626 m)   Wt 215 lb (97.5 kg)   SpO2 94%   BMI 36.90 kg/m

## 2021-03-13 DIAGNOSIS — M25572 Pain in left ankle and joints of left foot: Secondary | ICD-10-CM | POA: Diagnosis not present

## 2021-03-14 ENCOUNTER — Encounter: Payer: Medicare PPO | Attending: Physical Medicine & Rehabilitation | Admitting: Physical Medicine & Rehabilitation

## 2021-03-14 ENCOUNTER — Encounter: Payer: Self-pay | Admitting: Physical Medicine & Rehabilitation

## 2021-03-14 ENCOUNTER — Other Ambulatory Visit: Payer: Self-pay

## 2021-03-14 VITALS — BP 115/71 | HR 72 | Temp 98.4°F | Ht 64.0 in | Wt 215.6 lb

## 2021-03-14 DIAGNOSIS — G249 Dystonia, unspecified: Secondary | ICD-10-CM | POA: Diagnosis not present

## 2021-03-14 NOTE — Patient Instructions (Signed)

## 2021-03-14 NOTE — Progress Notes (Signed)
Botox Injection for spasticity using needle EMG guidance  Dilution: 50 Units/ml Indication: Severe spasticity which interferes with ADL,mobility and/or  hygiene and is unresponsive to medication management and other conservative care Informed consent was obtained after describing risks and benefits of the procedure with the patient. This includes bleeding, bruising, infection, excessive weakness, or medication side effects. A REMS form is on file and signed. Needle: 25g 2" needle electrode Number of units per muscle Right posterior tibialis 75U 25 U waste All injections were done after obtaining appropriate EMG activity and after negative drawback for blood. The patient tolerated the procedure well. Post procedure instructions were given. A followup appointment was made.

## 2021-03-20 ENCOUNTER — Other Ambulatory Visit: Payer: Self-pay

## 2021-03-20 ENCOUNTER — Telehealth: Payer: Self-pay | Admitting: Orthopaedic Surgery

## 2021-03-20 ENCOUNTER — Encounter: Payer: Self-pay | Admitting: Physician Assistant

## 2021-03-20 ENCOUNTER — Ambulatory Visit (INDEPENDENT_AMBULATORY_CARE_PROVIDER_SITE_OTHER): Payer: Medicare PPO

## 2021-03-20 ENCOUNTER — Encounter: Payer: Self-pay | Admitting: Orthopaedic Surgery

## 2021-03-20 ENCOUNTER — Ambulatory Visit: Payer: Medicare PPO | Admitting: Physician Assistant

## 2021-03-20 DIAGNOSIS — M25572 Pain in left ankle and joints of left foot: Secondary | ICD-10-CM

## 2021-03-20 DIAGNOSIS — S82425A Nondisplaced transverse fracture of shaft of left fibula, initial encounter for closed fracture: Secondary | ICD-10-CM

## 2021-03-20 NOTE — Telephone Encounter (Signed)
Pt called asking if she can see another dr about left leg since Dr. Ninfa Linden and PA Carlis Abbott has no appt available until next week. Please call pt at [phone].

## 2021-03-20 NOTE — Telephone Encounter (Signed)
Called and worked in

## 2021-03-20 NOTE — Progress Notes (Signed)
Office Visit Note   Patient: Lindsay Clark           Date of Birth: Sep 06, 1940           MRN: [id number] Visit Date: 03/20/2021              Requested by: [name], MD [address] [place],  [place] [id number] PCP: [name], MD   Assessment & Plan: Visit Diagnoses:  1. Pain in left ankle and joints of left foot   2. Closed nondisplaced transverse fracture of shaft of left fibula, initial encounter     Plan:  Reviewed with her the radiographs which shows a transverse nondisplaced distal fibular shaft fracture.  No other fractures identified talus well located within the ankle mortise.  Offered new cam walker boot she defers.  Therefore a piece of felt is placed between the hard plastic on the boot and the liner to offload the medial ankle.  She is also encouraged to wear a thick sock.  Discussed with her wearing a shoe to the same height as the other to avoid knee and back pain.  Xeroform was placed over the medial abrasion and a Tegaderm bandage is applied she will leave these on for 2 days at a time supplies given.  She will follow-up with [place] in 2 weeks for repeat radiographs.  Questions encouraged and answered at length.  Elevation encouraged  Follow-Up Instructions: Return in about 2 weeks (around 04/03/2021) for Radiographs.   Orders:  Orders Placed This Encounter  Procedures   XR Ankle Complete Left   No orders of the defined types were placed in this encounter.     Procedures: No procedures performed   Clinical Data: No additional findings.   Subjective: Chief Complaint  Patient presents with   Left Leg - Pain    HPI Lindsay Clark is a 80 year old female comes in today with left leg pain.  She reports that she was walking on the sidewalk at [facility] 1 week ago when she had sudden onset of left lower leg pain.  She is unable to walk due to the pain.  She is seen in the urgent care at [facility] where radiographs of her tibia were  performed.  I personally reviewed these that she had no acute fracture.  She is placed appropriately in a cam walker boot weightbearing as tolerated.  She is here today for follow-up.  She notes she has chronic swelling of her left lower leg but this is worse than usual.  She is also developed a blister that has gone on to the skin and abrasion over the medial aspect of the ankle due to the cam walker boot. Review of Systems See HPI  Objective: Vital Signs: There were no vitals taken for this visit.  Physical Exam Constitutional:      Appearance: She is not ill-appearing or diaphoretic.  Cardiovascular:     Pulses: Normal pulses.  Pulmonary:     Effort: Pulmonary effort is normal.  Neurological:     Mental Status: She is alert.  Psychiatric:        Mood and Affect: Mood normal.    Ortho Exam Left calf supple.  Achilles nontender.  Nontender over the posterior tibial tendon.  Nontender over the peroneal tendons.  Nontender of the lateral and medial malleolus.  Tenderness only over the left distal fibular shaft.  Significant swelling left ankle global and dorsal left foot.  No ecchymosis erythema.  Left  medial ankle abrasion without any signs of infection. Specialty Comments:  No specialty comments available.  Imaging: XR Ankle Complete Left  Result Date: 03/20/2021 Left ankle 3 views: Talus well located within the ankle mortise no diastases.  Transverse fracture distal fibular shaft nondisplaced present.  No other fractures or bony abnormalities.    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   Left knee pain 08/09/2020   Persistent atrial fibrillation ([place]) 04/05/2020   Secondary hypercoagulable state ([place]) 04/05/2020   Primary osteoarthritis of left knee 09/02/2019   Prolapsed internal hemorrhoids, grade 2 03/03/2019   Brain lesion 06/24/2017   Osteoarthritis    Multiple lesions on CT of brain  and spine    Movement disorder    Lumbar spondylosis    Hyperlipidemia    DDD (degenerative disc disease), lumbar    Dermatitis    Cervical spondylosis    Cervical disc disease    Diastolic dysfunction 02/17/2535   Syncope 04/18/2016   Anxiety 04/18/2016   Hypertension    Autoimmune disorder-autoimmune encephalitis    Dystonia 03/16/2015   Coronary artery calcification of native artery 11/04/2014   Abnormal finding on MRI of brain 10/04/2014   Pruritus 64/40/3474   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   Past Medical History:  Diagnosis Date   Atherosclerosis    mild cerebral   Atrial fibrillation and flutter (HCC)    Autoimmune disorder (HCC)    Autoimmune encephalitis    Cervical disc disease    with spondylosis   Cervical spondylosis    Chronic dermatitis    eczematous   Colitis    COVID 10/2020   DDD (degenerative disc disease), lumbar    Frozen shoulder    left   Hyperlipidemia    Hypertension    IBS (irritable bowel syndrome)    with diarrhea   Lateral epicondylitis    Lumbar spondylosis    Lupus (HCC)    Movement disorder    Multiple lesions on CT of brain and spine    reports lesions from previous CT scan at [facility]    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   PONV (postoperative nausea and vomiting)    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   Torn meniscus    left    Family History  Problem Relation Age of Onset   Cancer Mother 43       colon cancer   Stroke Mother    Colon cancer Mother 76   Heart attack Father 72   Emphysema Father    Cancer Maternal Grandmother 50       uterine   Thyroid disease Maternal Grandmother    Thrombosis Maternal Grandfather    Thyroid disease Son     Past Surgical History:  Procedure Laterality Date   A FLUTTER ABLATION  08/2020   [facility]   CATARACT EXTRACTION     COLONOSCOPY     EYE SURGERY Left    HEMORRHOID BANDING     KNEE ARTHROSCOPY Left 08/09/2020   Procedure: LEFT KNEE ARTHROSCOPY MEDIAL AND  LATERAL MENISECTONMY AND CONDROPLASTY;  Surgeon: Melrose Nakayama, MD;  Location: [facility];  Service: Orthopedics;  Laterality: Left;   LOOP RECORDER INSERTION N/A 07/05/2017   Procedure: LOOP RECORDER INSERTION;  Surgeon: Thompson Grayer, MD;  Location: [facility];  Service: Cardiovascular;  Laterality: N/A;   TUBAL LIGATION  1980   WISDOM TOOTH EXTRACTION  Social History   Occupational History   Occupation: Retired    Fish farm manager: OTHER  Tobacco Use   Smoking status: Never   Smokeless tobacco: Never  Vaping Use   Vaping Use: Never used  Substance and Sexual Activity   Alcohol use: Yes    Alcohol/week: 4.0 standard drinks    Types: 4 Glasses of wine per week    Comment: 4 glasses of wine weekly   Drug use: No   Sexual activity: Not Currently

## 2021-03-20 NOTE — Telephone Encounter (Signed)
You have 19..is this ok?

## 2021-03-27 ENCOUNTER — Encounter: Payer: Self-pay | Admitting: Orthopaedic Surgery

## 2021-03-28 NOTE — Telephone Encounter (Signed)
Please advise 

## 2021-03-30 NOTE — Progress Notes (Signed)
HPI:FU PAF. 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 Patient seen in September 2017 with complaints of palpitations and near syncope. Patient was scheduled for an exercise treadmill and when she came she was noted to have runs of paroxysmal atrial tachycardia/fibrillation. Procedure was canceled. Monitor October 2018 showed paroxysmal atririllation and flutter. Nuclear study October 2018 showed ejection fraction 67%, apical thinning but no ischemia. Placed on apixaban.  Had implantable loop recorder placed by Dr. Rayann Heman for syncope and recurrent palpitations. She has been found to have "heart pounding" episodes not related to arrhythmia. Echo repeated 2/21 showed normal LV function, grade 1 DD, mild to moderate LAE. Coronary CTA 2/21 showed Ca score 36 and minimal (0-24%) in proximal LM, proximal and mid LAD and proximal Lcx.  Patient had atrial fibrillation ablation at [facility] May 2022.  Since last seen, she has some dyspnea on exertion unchanged.  No orthopnea or PND.  Occasional mild pedal edema.    Current Outpatient Medications  Medication Sig Dispense Refill   ELIQUIS 5 MG TABS tablet TAKE 1 TABLET BY MOUTH TWICE DAILY. 60 tablet 5   furosemide (LASIX) 40 MG tablet Take 2 tablets (80 mg total) by mouth daily. (Patient taking differently: Take 80 mg by mouth in the morning.) 180 tablet 3   hydroxychloroquine (PLAQUENIL) 200 MG tablet Take 200 mg by mouth 2 (two) times daily.     hydrOXYzine (ATARAX/VISTARIL) 10 MG tablet Take 1 tablet by mouth daily.     lipase/protease/amylase (CREON) 36000 UNITS CPEP capsule Take 1 capsule (36,000 Units total) by mouth 3 (three) times daily with meals. 48 capsule 0   metoprolol succinate (TOPROL-XL) 50 MG 24 hr tablet Take 1 tablet (50 mg total) by mouth 2 (two) times daily. 180 tablet 3   Multiple Vitamin (MULTIVITAMIN WITH MINERALS) TABS tablet Take 1 tablet by mouth in the morning. One-A-Day for Women 50+     potassium chloride  (KLOR-CON) 10 MEQ tablet Take 20 mEq by mouth daily with breakfast.     Propylene Glycol 0.6 % SOLN Place 1 drop into both eyes at bedtime.     No current facility-administered medications for this visit.     Past Medical History:  Diagnosis Date   Atherosclerosis    mild cerebral   Atrial fibrillation and flutter (HCC)    Autoimmune disorder (HCC)    Autoimmune encephalitis    Cervical disc disease    with spondylosis   Cervical spondylosis    Chronic dermatitis    eczematous   Colitis    COVID 10/2020   DDD (degenerative disc disease), lumbar    Frozen shoulder    left   Hyperlipidemia    Hypertension    IBS (irritable bowel syndrome)    with diarrhea   Lateral epicondylitis    Lumbar spondylosis    Lupus (HCC)    Movement disorder    Multiple lesions on CT of brain and spine    reports lesions from previous CT [facility]    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   PONV (postoperative nausea and vomiting)    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   Torn meniscus    left    Past Surgical History:  Procedure Laterality Date   A FLUTTER ABLATION  08/2020   [facility]   CATARACT EXTRACTION     COLONOSCOPY     EYE SURGERY Left    HEMORRHOID BANDING     KNEE ARTHROSCOPY  Left 08/09/2020   Procedure: LEFT KNEE ARTHROSCOPY MEDIAL AND LATERAL MENISECTONMY AND CONDROPLASTY;  Surgeon: Melrose Nakayama, MD;  Location: [facility];  Service: Orthopedics;  Laterality: Left;   LOOP RECORDER INSERTION N/A 07/05/2017   Procedure: LOOP RECORDER INSERTION;  Surgeon: Thompson Grayer, MD;  Location: [place] [facility];  Service: Cardiovascular;  Laterality: N/A;   TUBAL LIGATION  1980   WISDOM TOOTH EXTRACTION      Social History   Socioeconomic History   Marital status: Married    Spouse name: [name]   Number of children: 2   Years of education: MAx2   Highest education level: Not on file  Occupational History   Occupation: Retired    Fish farm manager: OTHER  Tobacco Use    Smoking status: Never   Smokeless tobacco: Never  Vaping Use   Vaping Use: Never used  Substance and Sexual Activity   Alcohol use: Yes    Alcohol/week: 4.0 standard drinks    Types: 4 Glasses of wine per week    Comment: 4 glasses of wine weekly   Drug use: No   Sexual activity: Not Currently  Other Topics Concern   Not on file  Social History Narrative   Patient lives at home with her spouse.   Caffeine Use: 2-3 cups daily   Social Determinants of Health   Financial Resource Strain: Not on file  Food Insecurity: Not on file  Transportation Needs: Not on file  Physical Activity: Not on file  Stress: Not on file  Social Connections: Not on file  Intimate Partner Violence: Not on file    Family History  Problem Relation Age of Onset   Cancer Mother 11       colon cancer   Stroke Mother    Colon cancer Mother 10   Heart attack Father 56   Emphysema Father    Cancer Maternal Grandmother 21       uterine   Thyroid disease Maternal Grandmother    Thrombosis Maternal Grandfather    Thyroid disease Son     ROS: Some residual pain in left foot from recent fracture but no fevers or chills, productive cough, hemoptysis, dysphasia, odynophagia, melena, hematochezia, dysuria, hematuria, rash, seizure activity, orthopnea, PND, pedal edema, claudication. Remaining systems are negative.  Physical Exam: Well-developed well-nourished in no acute distress.  Skin is warm and dry.  HEENT is normal.  Neck is supple.  Chest is clear to auscultation with normal expansion.  Cardiovascular exam is regular rate and rhythm.  Abdominal exam nontender or distended. No masses palpated. Extremities show no edema.  Left lower extremity in cast. neuro grossly intact  A/P  1 paroxysmal atrial fibrillation-patient is status post ablation May 2022.  She remains in sinus rhythm.  Amiodarone was discontinued at her request.  Continue Toprol (she would like to decrease to 25 mg twice daily) and  apixaban.  2 hypertension-patient's blood pressure is controlled.  Continue present medications and follow-up.  3 coronary atherosclerosis-patient not on aspirin given need for apixaban.  She has declined lipid-lowering medications.  4 history of syncope-she has had no recurrences.  5 lower extremity edema-we will continue diuretic at present dose. Check BMET.  Kirk Ruths, MD

## 2021-04-03 ENCOUNTER — Encounter: Payer: Self-pay | Admitting: Physician Assistant

## 2021-04-03 ENCOUNTER — Other Ambulatory Visit: Payer: Self-pay

## 2021-04-03 ENCOUNTER — Ambulatory Visit: Payer: Medicare PPO | Admitting: Physician Assistant

## 2021-04-03 ENCOUNTER — Ambulatory Visit (INDEPENDENT_AMBULATORY_CARE_PROVIDER_SITE_OTHER): Payer: Medicare PPO

## 2021-04-03 DIAGNOSIS — S82402A Unspecified fracture of shaft of left fibula, initial encounter for closed fracture: Secondary | ICD-10-CM | POA: Diagnosis not present

## 2021-04-03 DIAGNOSIS — I480 Paroxysmal atrial fibrillation: Secondary | ICD-10-CM | POA: Diagnosis not present

## 2021-04-03 DIAGNOSIS — S82425A Nondisplaced transverse fracture of shaft of left fibula, initial encounter for closed fracture: Secondary | ICD-10-CM

## 2021-04-03 DIAGNOSIS — I10 Essential (primary) hypertension: Secondary | ICD-10-CM | POA: Diagnosis not present

## 2021-04-03 NOTE — Progress Notes (Addendum)
Office Visit Note   Patient: Lindsay Clark           Date of Birth: 1940/07/07           MRN: [id number] Visit Date: 04/03/2021              Requested by: [name], MD [address],  [place] [address] PCP: [name], MD   Assessment & Plan: Visit Diagnoses:  1. Closed nondisplaced transverse fracture of shaft of left fibula, initial encounter     Plan: She will continue to ambulate in the cam walker boot for another 2 weeks and then transition to an ASO brace.  We will see her back in 4 weeks and obtain new radiographs of the left ankle.  In regards to her knee she has known tricompartmental arthritis with full cartilage loss involving the lateral compartment recommend quad strengthening.  She has recently had cortisone injection in November.  I explained to her that I do feel that the fall exacerbated the knee arthritis and also that the boot exacerbates the pain in the knee.  Questions were encouraged and answered at length today.  Follow-Up Instructions: Return in about 4 weeks (around 05/01/2021) for Radiographs.   Orders:  Orders Placed This Encounter  Procedures   XR Ankle Complete Left   No orders of the defined types were placed in this encounter.     Procedures: No procedures performed   Clinical Data: No additional findings.   Subjective: Chief Complaint  Patient presents with   Left Ankle - Pain, Follow-up    HPI Lindsay Clark returns today for follow-up of her left distal fibular shaft fracture.  She states she is having some pain below the right.  She also feels like her knee moves on her at times.  She has known arthritis of the left knee.  No new injuries.  She has been ambulating in the boot with no adverse effects. Review of Systems See HPI  Objective: Vital Signs: There were no vitals taken for this visit.  Physical Exam General: Well-developed well-nourished female no acute distress Ortho Exam Left ankle good  range of motion with dorsiflexion plantarflexion.  Sensation intact throughout the foot.  Tenderness distal fibular shaft.  Otherwise ankle is nontender.  Left knee good range of motion tenderness along the lateral joint line.  Obvious valgus deformity left knee.  Abrasion medial aspect of the ankle is healing well.  She is using the fashioned "doughnut" to offload the medial aspect of ankle. Specialty Comments:  No specialty comments available.  Imaging: XR Ankle Complete Left  Result Date: 04/03/2021 Left ankle 3 views: Talus well located within the ankle mortise no diastases.  Distal fibular shaft fracture remains unchanged in overall position alignment.  Early signs of consolidation are seen.    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   Left knee pain 08/09/2020   Persistent atrial fibrillation ([place]) 04/05/2020   Secondary hypercoagulable state ([place]) 04/05/2020   Primary osteoarthritis of left knee 09/02/2019   Prolapsed internal hemorrhoids, grade 2 03/03/2019   Brain lesion 06/24/2017   Osteoarthritis    Multiple lesions on CT of brain and spine    Movement disorder    Lumbar spondylosis    Hyperlipidemia    DDD (degenerative disc disease), lumbar    Dermatitis    Cervical spondylosis    Cervical disc disease  Diastolic dysfunction 64/40/3474   Syncope 04/18/2016   Anxiety 04/18/2016   Hypertension    Autoimmune disorder-autoimmune encephalitis    Dystonia 03/16/2015   Coronary artery calcification of native artery 11/04/2014   Abnormal finding on MRI of brain 10/04/2014   Pruritus 25/95/6387   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   Past Medical History:  Diagnosis Date   Atherosclerosis    mild cerebral   Atrial fibrillation and flutter (HCC)    Autoimmune disorder (HCC)    Autoimmune encephalitis    Cervical disc disease    with spondylosis   Cervical spondylosis    Chronic  dermatitis    eczematous   Colitis    COVID 10/2020   DDD (degenerative disc disease), lumbar    Frozen shoulder    left   Hyperlipidemia    Hypertension    IBS (irritable bowel syndrome)    with diarrhea   Lateral epicondylitis    Lumbar spondylosis    Lupus (HCC)    Movement disorder    Multiple lesions on CT of brain and spine    reports lesions from previous CT scan at [facility]    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   PONV (postoperative nausea and vomiting)    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   Torn meniscus    left    Family History  Problem Relation Age of Onset   Cancer Mother 81       colon cancer   Stroke Mother    Colon cancer Mother 28   Heart attack Father 77   Emphysema Father    Cancer Maternal Grandmother 20       uterine   Thyroid disease Maternal Grandmother    Thrombosis Maternal Grandfather    Thyroid disease Son     Past Surgical History:  Procedure Laterality Date   A FLUTTER ABLATION  08/2020   [facility]   CATARACT EXTRACTION     COLONOSCOPY     EYE SURGERY Left    HEMORRHOID BANDING     KNEE ARTHROSCOPY Left 08/09/2020   Procedure: LEFT KNEE ARTHROSCOPY MEDIAL AND LATERAL MENISECTONMY AND CONDROPLASTY;  Surgeon: Melrose Nakayama, MD;  Location: [facility];  Service: Orthopedics;  Laterality: Left;   LOOP RECORDER INSERTION N/A 07/05/2017   Procedure: LOOP RECORDER INSERTION;  Surgeon: Thompson Grayer, MD;  Location: [place] [facility];  Service: Cardiovascular;  Laterality: N/A;   TUBAL LIGATION  1980   WISDOM TOOTH EXTRACTION     Social History   Occupational History   Occupation: Retired    Fish farm manager: OTHER  Tobacco Use   Smoking status: Never   Smokeless tobacco: Never  Vaping Use   Vaping Use: Never used  Substance and Sexual Activity   Alcohol use: Yes    Alcohol/week: 4.0 standard drinks    Types: 4 Glasses of wine per week    Comment: 4 glasses of wine weekly   Drug use: No   Sexual activity: Not Currently

## 2021-04-06 ENCOUNTER — Encounter: Payer: Self-pay | Admitting: Cardiology

## 2021-04-06 ENCOUNTER — Telehealth: Payer: Self-pay

## 2021-04-06 ENCOUNTER — Ambulatory Visit: Payer: Medicare PPO | Admitting: Cardiology

## 2021-04-06 VITALS — BP 132/80 | HR 83 | Ht 64.0 in | Wt 213.0 lb

## 2021-04-06 DIAGNOSIS — R609 Edema, unspecified: Secondary | ICD-10-CM

## 2021-04-06 DIAGNOSIS — I2510 Atherosclerotic heart disease of native coronary artery without angina pectoris: Secondary | ICD-10-CM | POA: Diagnosis not present

## 2021-04-06 DIAGNOSIS — I480 Paroxysmal atrial fibrillation: Secondary | ICD-10-CM

## 2021-04-06 DIAGNOSIS — I2584 Coronary atherosclerosis due to calcified coronary lesion: Secondary | ICD-10-CM | POA: Diagnosis not present

## 2021-04-06 DIAGNOSIS — I10 Essential (primary) hypertension: Secondary | ICD-10-CM

## 2021-04-06 LAB — BASIC METABOLIC PANEL WITH GFR
BUN/Creatinine Ratio: 25 (ref 12–28)
BUN: 14 mg/dL (ref 8–27)
CO2: 28 mmol/L (ref 20–29)
Calcium: 8.8 mg/dL (ref 8.7–10.3)
Chloride: 97 mmol/L (ref 96–106)
Creatinine, Ser: 0.57 mg/dL (ref 0.57–1.00)
Glucose: 102 mg/dL — ABNORMAL HIGH (ref 70–99)
Potassium: 3.5 mmol/L (ref 3.5–5.2)
Sodium: 141 mmol/L (ref 134–144)
eGFR: 93 mL/min/1.73 (ref >59)

## 2021-04-06 MED ORDER — METOPROLOL SUCCINATE ER 25 MG PO TB24: 25.0000 mg | ORAL_TABLET | Freq: Two times a day (BID) | ORAL | 5 refills | Status: DC

## 2021-04-06 NOTE — Patient Instructions (Addendum)
Medication Instructions:  DECREASE METOPROLOL SUCCINATE TO 25mg  TWICE DAILY- A NEW PRESCRIPTION FOR 25mg  TABLETS HAS BEEN SENT TO YOUR PHARMACY *If you need a refill on your cardiac medications before your next appointment, please call your pharmacy*  BLOOD WORK TODAY   Follow-Up: At [facility], you and your health needs are our priority.  As part of our continuing mission to provide you with exceptional heart care, we have created designated Provider Care Teams.  These Care Teams include your primary Cardiologist (physician) and Advanced Practice Providers (APPs -  Physician Assistants and Nurse Practitioners) who all work together to provide you with the care you need, when you need it.  Your next appointment:   6 month(s)  The format for your next appointment:   In Person  Provider:   Kirk Ruths, MD

## 2021-04-13 DIAGNOSIS — U071 COVID-19: Secondary | ICD-10-CM | POA: Diagnosis not present

## 2021-05-01 ENCOUNTER — Encounter: Payer: Self-pay | Admitting: Physician Assistant

## 2021-05-01 ENCOUNTER — Ambulatory Visit: Payer: Medicare PPO | Admitting: Physician Assistant

## 2021-05-01 ENCOUNTER — Ambulatory Visit (INDEPENDENT_AMBULATORY_CARE_PROVIDER_SITE_OTHER): Payer: Medicare PPO

## 2021-05-01 ENCOUNTER — Encounter: Payer: Self-pay | Admitting: Internal Medicine

## 2021-05-01 ENCOUNTER — Other Ambulatory Visit: Payer: Self-pay

## 2021-05-01 DIAGNOSIS — S82425A Nondisplaced transverse fracture of shaft of left fibula, initial encounter for closed fracture: Secondary | ICD-10-CM

## 2021-05-01 NOTE — Progress Notes (Signed)
HPI: Ms. Reising returns now approximately 7 weeks status post left distal fibular fracture.  She overall states she is feeling better.  She has been having difficulty wearing the ASO brace.  She is to her husband shoe due to the bulkiness of the ASO.  She is today wearing regular shoe when not wearing the ASO brace and bearing weight.  She is using no assistive device.  Physical exam: Left ankle good range of motion the ankle without pain.  She has tenderness over the distal fibular shaft with palpable callus.  Left calf supple nontender.  Radiographs: 3 views left ankle: Distal fibular shaft fracture remains nondisplaced.  There is abundant callus formation.  Left ankle is well located without any evidence of diastases.  No other bony abnormalities noted.  Impression: Left distal fibular shaft fracture  Plan: She can discontinue the ASO brace at this point in time.  Recommend no high-impact activity.  Follow-up as needed.  Questions encouraged and answered at length

## 2021-05-02 ENCOUNTER — Telehealth: Payer: Self-pay

## 2021-05-02 NOTE — Telephone Encounter (Signed)
Patient has appointment set up for 05/24/2021 to discuss stool changes. We communicated thru [facility].

## 2021-05-09 ENCOUNTER — Ambulatory Visit: Payer: Medicare PPO | Admitting: Physical Medicine & Rehabilitation

## 2021-05-11 ENCOUNTER — Other Ambulatory Visit: Payer: Self-pay

## 2021-05-11 ENCOUNTER — Encounter: Payer: Self-pay | Admitting: Physical Medicine & Rehabilitation

## 2021-05-11 ENCOUNTER — Encounter: Payer: Medicare PPO | Attending: Physical Medicine & Rehabilitation | Admitting: Physical Medicine & Rehabilitation

## 2021-05-11 VITALS — BP 120/74 | HR 78 | Temp 99.4°F | Ht 64.0 in | Wt 217.0 lb

## 2021-05-11 DIAGNOSIS — G249 Dystonia, unspecified: Secondary | ICD-10-CM | POA: Diagnosis not present

## 2021-05-11 NOTE — Progress Notes (Signed)
Subjective:    Patient ID: Lindsay Clark, female    DOB: May 15, 1940, 81 y.o.   MRN: [id number]  HPI  81 year old female with history of focal dystonia right lower extremity causing inversion of the foot.  The patient underwent botulinum toxin injection approximately 2 months ago on 03/14/2021.  Unfortunately the patient did not feel there was much improvement in her foot inversion.  She states that she has abnormal shoe wear on the lateral border of her shoes because of positioning her foot in a inverted position while sitting.  She is able to ambulate without going down on the lateral border of her foot. Pain Inventory Average Pain 0 Pain Right Now 0 My pain is  No Pain  LOCATION OF PAIN  Right hand, right hip down to toes uncontrolled issues  BOWEL Number of stools per week: 7 or more  BLADDER Normal   Mobility walk without assistance how many minutes can you walk? unknown ability to climb steps?  yes do you drive?  yes Do you have any goals in this area?  yes  Function retired  Neuro/Psych weakness numbness tingling trouble walking  Prior Studies Any changes since last visit?  yes bone scan  Physicians involved in your care Any changes since last visit?  no   Family History  Problem Relation Age of Onset   Cancer Mother 49       colon cancer   Stroke Mother    Colon cancer Mother 12   Heart attack Father 58   Emphysema Father    Cancer Maternal Grandmother 40       uterine   Thyroid disease Maternal Grandmother    Thrombosis Maternal Grandfather    Thyroid disease Son    Social History   Socioeconomic History   Marital status: Married    Spouse name: [name]   Number of children: 2   Years of education: MAx2   Highest education level: Not on file  Occupational History   Occupation: Retired    Fish farm manager: OTHER  Tobacco Use   Smoking status: Never   Smokeless tobacco: Never  Vaping Use   Vaping Use: Never used  Substance and Sexual Activity    Alcohol use: Yes    Alcohol/week: 4.0 standard drinks    Types: 4 Glasses of wine per week    Comment: 4 glasses of wine weekly   Drug use: No   Sexual activity: Not Currently  Other Topics Concern   Not on file  Social History Narrative   Patient lives at home with her spouse.   Caffeine Use: 2-3 cups daily   Social Determinants of Health   Financial Resource Strain: Not on file  Food Insecurity: Not on file  Transportation Needs: Not on file  Physical Activity: Not on file  Stress: Not on file  Social Connections: Not on file   Past Surgical History:  Procedure Laterality Date   A FLUTTER ABLATION  08/2020   [facility]   CATARACT EXTRACTION     COLONOSCOPY     EYE SURGERY Left    HEMORRHOID BANDING     KNEE ARTHROSCOPY Left 08/09/2020   Procedure: LEFT KNEE ARTHROSCOPY MEDIAL AND LATERAL [place];  Surgeon: Melrose Nakayama, MD;  Location: [facility];  Service: Orthopedics;  Laterality: Left;   LOOP RECORDER INSERTION N/A 07/05/2017   Procedure: LOOP RECORDER INSERTION;  Surgeon: Thompson Grayer, MD;  Location: [place] [facility];  Service: Cardiovascular;  Laterality: N/A;   TUBAL  LIGATION  1980   WISDOM TOOTH EXTRACTION     Past Medical History:  Diagnosis Date   Atherosclerosis    mild cerebral   Atrial fibrillation and flutter (HCC)    Autoimmune disorder (HCC)    Autoimmune encephalitis    Cervical disc disease    with spondylosis   Cervical spondylosis    Chronic dermatitis    eczematous   Colitis    COVID 10/2020   DDD (degenerative disc disease), lumbar    Frozen shoulder    left   Hyperlipidemia    Hypertension    IBS (irritable bowel syndrome)    with diarrhea   Lateral epicondylitis    Lumbar spondylosis    Lupus (HCC)    Movement disorder    Multiple lesions on CT of brain and spine    reports lesions from previous CT scan at [facility]    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   PONV (postoperative nausea and vomiting)    Small intestinal  bacterial overgrowth (SIBO) 10/21/2020   Torn meniscus    left   There were no vitals taken for this visit.  Opioid Risk Score:   Fall Risk Score:  `1  Depression screen PHQ 2/9  Depression screen [facility] 2/9 03/14/2021 01/19/2021 01/09/2021 10/04/2020  Decreased Interest 0 0 0 0  Down, Depressed, Hopeless 0 0 0 0  PHQ - 2 Score 0 0 0 0  Tired, decreased energy - 0 - -  Change in appetite - 0 - -  Feeling bad or failure about yourself  - 0 - -  Trouble concentrating - 0 - -  Moving slowly or fidgety/restless - 0 - -  Some recent data might be hidden    Review of Systems  Musculoskeletal:  Positive for gait problem.  Neurological:  Positive for weakness and numbness.       Tingling  All other systems reviewed and are negative.     Objective:   Physical Exam Constitutional:      Appearance: She is obese.  HENT:     Head: Normocephalic and atraumatic.  Eyes:     Extraocular Movements: Extraocular movements intact.     Conjunctiva/sclera: Conjunctivae normal.     Pupils: Pupils are equal, round, and reactive to light.  Neurological:     Mental Status: She is alert and oriented to person, place, and time.     Comments: Motor strength is 5/5 in the right hip flexor knee extensor ankle dorsiflexor 5/5 in left hip flexor knee extensor ankle dorsiflexor Tone no evidence of clonus at the knee or at the ankle.  The patient does have tendency to position her foot in a inverted position resting her lateral border the foot on the floor.  There is also intermittent twitches into additional inversion. There is no pain with eversion passively.  Psychiatric:        Mood and Affect: Mood normal.        Behavior: Behavior normal.          Assessment & Plan:  1.  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  No significant improvement after botulinum toxin injection performed with 75 units into the right tibialis posterior on 03/14/2021.  We discussed treatment options including  the use of ultrasound guidance rather than EMG guidance.  We also discussed increasing total dosage from 75 units to 100 units.  Patient is in agreement we will schedule at next available not before 06/14/2021.

## 2021-05-24 ENCOUNTER — Encounter: Payer: Self-pay | Admitting: Internal Medicine

## 2021-05-24 ENCOUNTER — Ambulatory Visit: Payer: Medicare PPO | Admitting: Internal Medicine

## 2021-05-24 VITALS — BP 122/66 | HR 73 | Ht 64.0 in | Wt 218.0 lb

## 2021-05-24 DIAGNOSIS — R194 Change in bowel habit: Secondary | ICD-10-CM

## 2021-05-24 DIAGNOSIS — K6389 Other specified diseases of intestine: Secondary | ICD-10-CM | POA: Diagnosis not present

## 2021-05-24 DIAGNOSIS — R151 Fecal smearing: Secondary | ICD-10-CM | POA: Diagnosis not present

## 2021-05-24 NOTE — Progress Notes (Signed)
Lindsay Clark 81 y.o. 10/03/1940 [id number]  Assessment & Plan:   Encounter Diagnoses  Name Primary?   Fecal smearing Yes   Small intestinal bacterial overgrowth (SIBO)    Altered bowel habits - variable stool size    Trial of Benefiber for the fecal smearing.  Hopefully this soluble fiber will make a difference.  Should that fail we can consider a trial of Creon samples.  I again reminded her the pelvic PT is an option.  She has declined that in the past.  Gas problem seems a little better perhaps related to Xifaxan treatment #2 for SIBO.  Stool size sometimes flat.  However it returns to normal.  Observe, I do not think this merits further investigation.  CC: [name], MD   Subjective:   Chief Complaint: Bowel habit change  HPI 81 year old white woman with a history of SIBO and prolapsed hemorrhoids status post banding and chronic recurrent fecal smearing and malodorous gas that "escapes".  When she was here in November there was some confusion about whether or not she had been treated a second time for Xifaxan but at any rate she ended up taking a second round and thinks she is having a little bit less gas problem.  She is concerned because she has been having some flat stool since then but she also has normal-appearing bowel movements as well.  She continues to have some slight leakage and smearing of stool that is bothersome. Elderly mother had colon cancer and these bowel habit changes concerned her.  November 2017 colonoscopy - One 5 mm polyp in the rectum, removed with a cold snare. Resected and retrieved. - One 2 mm polyp in the descending colon, removed with a cold biopsy forceps. Resected and retrieved. - The examination was otherwise normal on direct and retroflexion views.  Polyps were an adenoma and a mucosal polyp no recall planned due to age and findings Wt Readings from Last 3 Encounters:  05/24/21 218 lb (98.9 kg)  05/11/21 217 lb (98.4 kg)   04/06/21 213 lb (96.6 kg)  97 kg 98 kg February 2022  Allergies  Allergen Reactions   Amlodipine     Other reaction(s): Unknown 04/19/15 Patient denies Other reaction(s): leg swelling   Diltiazem Swelling    Bilateral feet   Methotrexate     Other reaction(s): swelling in ankles Other reaction(s): swelling in ankles   Metronidazole     Other reaction(s): leg swelling Other reaction(s): leg swelling, pills   Other Other (See Comments)    Medication:Methychloroisothlazolinone/methylisothiazilinoneCL +Me-Isothiazolinone Reaction: unknown-patch testing Medication:glyceryl thioglycolate  Reaction:unknown-patch testing   Quaternium-15 Other (See Comments)    unknown reaction-patching testing   Current Meds  Medication Sig   ELIQUIS 5 MG TABS tablet TAKE 1 TABLET BY MOUTH TWICE DAILY.   furosemide (LASIX) 40 MG tablet Take 2 tablets (80 mg total) by mouth daily. (Patient taking differently: Take 80 mg by mouth in the morning.)   hydroxychloroquine (PLAQUENIL) 200 MG tablet Take 200 mg by mouth 2 (two) times daily.   hydrOXYzine (ATARAX/VISTARIL) 10 MG tablet Take 1 tablet by mouth daily.   metoprolol succinate (TOPROL-XL) 25 MG 24 hr tablet Take 1 tablet (25 mg total) by mouth 2 (two) times daily.   Multiple Vitamin (MULTIVITAMIN WITH MINERALS) TABS tablet Take 1 tablet by mouth in the morning. One-A-Day for Women 50+   potassium chloride (KLOR-CON) 10 MEQ tablet Take 20 mEq by mouth daily with breakfast.   Propylene Glycol 0.6 % SOLN  Place 1 drop into both eyes at bedtime.   Past Medical History:  Diagnosis Date   Atherosclerosis    mild cerebral   Atrial fibrillation and flutter (HCC)    Autoimmune disorder (HCC)    Autoimmune encephalitis    Cervical disc disease    with spondylosis   Cervical spondylosis    Chronic dermatitis    eczematous   Colitis    COVID 10/2020   DDD (degenerative disc disease), lumbar    Frozen shoulder    left   Hyperlipidemia     Hypertension    IBS (irritable bowel syndrome)    with diarrhea   Lateral epicondylitis    Lumbar spondylosis    Lupus (HCC)    Movement disorder    Multiple lesions on CT of brain and spine    reports lesions from previous CT scan at [facility]    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   PONV (postoperative nausea and vomiting)    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   Torn meniscus    left   Past Surgical History:  Procedure Laterality Date   A FLUTTER ABLATION  08/2020   [facility]   CATARACT EXTRACTION     COLONOSCOPY     EYE SURGERY Left    HEMORRHOID BANDING     KNEE ARTHROSCOPY Left 08/09/2020   Procedure: LEFT KNEE ARTHROSCOPY MEDIAL AND LATERAL MENISECTONMY AND CONDROPLASTY;  Surgeon: Melrose Nakayama, MD;  Location: [facility];  Service: Orthopedics;  Laterality: Left;   LOOP RECORDER INSERTION N/A 07/05/2017   Procedure: LOOP RECORDER INSERTION;  Surgeon: Thompson Grayer, MD;  Location: [facility];  Service: Cardiovascular;  Laterality: N/A;   TUBAL LIGATION  1980   WISDOM TOOTH EXTRACTION     Social History   Social History Narrative   Patient lives at home with her spouse.   Caffeine Use: 2-3 cups daily   family history includes Cancer (age of onset: 11) in her maternal grandmother; Cancer (age of onset: 95) in her mother; Colon cancer (age of onset: 25) in her mother; Emphysema in her father; Heart attack (age of onset: 23) in her father; Stroke in her mother; Thrombosis in her maternal grandfather; Thyroid disease in her maternal grandmother and son.   Review of Systems As above  Objective:   Physical Exam BP 122/66    Pulse 73    Ht 5\' 4"  (1.626 m)    Wt 218 lb (98.9 kg)    SpO2 98%    BMI 37.42 kg/m

## 2021-05-24 NOTE — Patient Instructions (Signed)
Try one tablespoon of benefiber daily for a month. If that works continue it and if not call us back.   Due to recent changes in healthcare laws, you may see the results of your imaging and laboratory studies on MyChart before your provider has had a chance to review them.  We understand that in some cases there may be results that are confusing or concerning to you. Not all laboratory results come back in the same time frame and the provider may be waiting for multiple results in order to interpret others.  Please give [place] 48 hours in order for your provider to thoroughly review all the results before contacting the office for clarification of your results.    I appreciate the opportunity to care for you. Silvano Rusk, MD, [facility]

## 2021-05-25 ENCOUNTER — Encounter: Payer: Self-pay | Admitting: Internal Medicine

## 2021-06-05 DIAGNOSIS — L821 Other seborrheic keratosis: Secondary | ICD-10-CM | POA: Diagnosis not present

## 2021-06-05 DIAGNOSIS — L932 Other local lupus erythematosus: Secondary | ICD-10-CM | POA: Diagnosis not present

## 2021-06-05 DIAGNOSIS — L304 Erythema intertrigo: Secondary | ICD-10-CM | POA: Diagnosis not present

## 2021-06-05 DIAGNOSIS — L308 Other specified dermatitis: Secondary | ICD-10-CM | POA: Diagnosis not present

## 2021-06-05 DIAGNOSIS — L300 Nummular dermatitis: Secondary | ICD-10-CM | POA: Diagnosis not present

## 2021-06-07 ENCOUNTER — Encounter: Payer: Self-pay | Admitting: Orthopaedic Surgery

## 2021-06-07 ENCOUNTER — Ambulatory Visit (INDEPENDENT_AMBULATORY_CARE_PROVIDER_SITE_OTHER): Payer: Medicare PPO | Admitting: Orthopaedic Surgery

## 2021-06-07 ENCOUNTER — Other Ambulatory Visit: Payer: Self-pay

## 2021-06-07 DIAGNOSIS — M25562 Pain in left knee: Secondary | ICD-10-CM

## 2021-06-07 DIAGNOSIS — M25561 Pain in right knee: Secondary | ICD-10-CM

## 2021-06-07 DIAGNOSIS — G8929 Other chronic pain: Secondary | ICD-10-CM

## 2021-06-07 MED ORDER — LIDOCAINE HCL 1 % IJ SOLN
3.0000 mL | INTRAMUSCULAR | Status: AC | PRN
  Administered 2021-06-07: 3 mL

## 2021-06-07 MED ORDER — METHYLPREDNISOLONE ACETATE 40 MG/ML IJ SUSP
40.0000 mg | INTRAMUSCULAR | Status: AC | PRN
  Administered 2021-06-07: 40 mg via INTRA_ARTICULAR

## 2021-06-07 NOTE — Progress Notes (Signed)
Office Visit Note   Patient: Lindsay Clark           Date of Birth: 1941-02-11           MRN: [id number] Visit Date: 06/07/2021              Requested by: [name], MD [place],   [id number] PCP: [name], MD   Assessment & Plan: Visit Diagnoses:  1. Chronic pain of right knee   2. Chronic pain of left knee     Plan: I did place a steroid injection of both knees today which she tolerated well.  We can see her back in 3 months before her next trip to provide steroid injections in her knees at that visit.  Once we do consider surgery, I would like to get a standing AP and lateral of her left knee.  Follow-Up Instructions: Return in about 3 months (around 09/04/2021).   Orders:  Orders Placed This Encounter  Procedures   Large Joint Inj   Large Joint Inj   No orders of the defined types were placed in this encounter.     Procedures: Large Joint Inj: R knee on 06/07/2021 11:12 AM Indications: diagnostic evaluation and pain Details: 22 G 1.5 in needle, superolateral approach  Arthrogram: No  Medications: 3 mL lidocaine 1 %; 40 mg methylPREDNISolone acetate 40 MG/ML Outcome: tolerated well, no immediate complications Procedure, treatment alternatives, risks and benefits explained, specific risks discussed. Consent was given by the patient. Immediately prior to procedure a time out was called to verify the correct patient, procedure, equipment, support staff and site/side marked as required. Patient was prepped and draped in the usual sterile fashion.    Large Joint Inj: L knee on 06/07/2021 11:12 AM Indications: diagnostic evaluation and pain Details: 22 G 1.5 in needle, superolateral approach  Arthrogram: No  Medications: 3 mL lidocaine 1 %; 40 mg methylPREDNISolone acetate 40 MG/ML Outcome: tolerated well, no immediate complications Procedure, treatment alternatives, risks and benefits explained, specific risks discussed.  Consent was given by the patient. Immediately prior to procedure a time out was called to verify the correct patient, procedure, equipment, support staff and site/side marked as required. Patient was prepped and draped in the usual sterile fashion.      Clinical Data: No additional findings.   Subjective: Chief Complaint  Patient presents with   Left Knee - Follow-up  The patient is well-known to me.  She is a very active 81 year old female with significant osteoarthritis and valgus malalignment of the left knee.  This does affect her gait detrimentally.  She puts more pressure with her right knee says she does get right knee pain as well.  She is also been dealing with a lateral malleolus fracture on the left ankle.  That has since healed and we saw it on x-rays last month.  What is interesting is that she has had all of her bone density exams being normal and even the last one normal.  I think that this may be stress that she puts on her ankle from walking different as it relates to the significant deformity of her left knee.  Regardless, she is very motivated and very active and is requesting steroid injections today.  She wants to consider knee replacement surgery sometime later after all of her traveling is finished.  I believe she has another trip in May.  What is unfortunate though is that her husband is dealing with  significant effects of COVID as a relates to his lungs.  HPI  Review of Systems There is currently listed no fever, chills, nausea, vomiting  Objective: Vital Signs: There were no vitals taken for this visit.  Physical Exam She is alert and orient x3 and in no acute distress. Ortho Exam Both knees were assessed today.  Her left knee does have valgus malalignment.  Both knees have global tenderness and pain. Specialty Comments:  No specialty comments available.  Imaging: No results found.   PMFS History: Patient Active Problem List   Diagnosis Date Noted    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   Left knee pain 08/09/2020   Persistent atrial fibrillation ([place]) 04/05/2020   Secondary hypercoagulable state ([place]) 04/05/2020   Primary osteoarthritis of left knee 09/02/2019   Prolapsed internal hemorrhoids, grade 2 03/03/2019   Brain lesion 06/24/2017   Osteoarthritis    Multiple lesions on CT of brain and spine    Movement disorder    Lumbar spondylosis    Hyperlipidemia    DDD (degenerative disc disease), lumbar    Dermatitis    Cervical spondylosis    Cervical disc disease    Diastolic dysfunction 88/41/6606   Syncope 04/18/2016   Anxiety 04/18/2016   Hypertension    Autoimmune disorder-autoimmune encephalitis    Dystonia 03/16/2015   Coronary artery calcification of native artery 11/04/2014   Abnormal finding on MRI of brain 10/04/2014   Pruritus 30/16/0109   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   Past Medical History:  Diagnosis Date   Atherosclerosis    mild cerebral   Atrial fibrillation and flutter (HCC)    Autoimmune disorder (HCC)    Autoimmune encephalitis    Cervical disc disease    with spondylosis   Cervical spondylosis    Chronic dermatitis    eczematous   Colitis    COVID 10/2020   DDD (degenerative disc disease), lumbar    Frozen shoulder    left   Hyperlipidemia    Hypertension    IBS (irritable bowel syndrome)    with diarrhea   Lateral epicondylitis    Lumbar spondylosis    Lupus (HCC)    Movement disorder    Multiple lesions on CT of brain and spine    reports lesions from previous CT scan at [facility]    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   PONV (postoperative nausea and vomiting)    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   Torn meniscus    left    Family History  Problem Relation Age of Onset   Cancer Mother 44       colon cancer   Stroke Mother    Colon cancer Mother 85   Heart attack Father 37   Emphysema Father    Cancer Maternal Grandmother 17        uterine   Thyroid disease Maternal Grandmother    Thrombosis Maternal Grandfather    Thyroid disease Son     Past Surgical History:  Procedure Laterality Date   A FLUTTER ABLATION  08/2020   [facility]   CATARACT EXTRACTION     COLONOSCOPY     EYE SURGERY Left    HEMORRHOID BANDING     KNEE ARTHROSCOPY Left 08/09/2020   Procedure: LEFT KNEE ARTHROSCOPY MEDIAL AND LATERAL MENISECTONMY AND CONDROPLASTY;  Surgeon: Melrose Nakayama, MD;  Location: [facility];  Service: Orthopedics;  Laterality: Left;   LOOP RECORDER INSERTION N/A 07/05/2017   Procedure: LOOP RECORDER INSERTION;  Surgeon: Thompson Grayer, MD;  Location: [facility];  Service: Cardiovascular;  Laterality: N/A;   TUBAL LIGATION  1980   WISDOM TOOTH EXTRACTION     Social History   Occupational History   Occupation: Retired    Fish farm manager: OTHER  Tobacco Use   Smoking status: Never   Smokeless tobacco: Never  Vaping Use   Vaping Use: Never used  Substance and Sexual Activity   Alcohol use: Yes    Alcohol/week: 4.0 standard drinks    Types: 4 Glasses of wine per week    Comment: 4 glasses of wine weekly   Drug use: No   Sexual activity: Not Currently

## 2021-06-08 DIAGNOSIS — E669 Obesity, unspecified: Secondary | ICD-10-CM | POA: Diagnosis not present

## 2021-06-08 DIAGNOSIS — Z79899 Other long term (current) drug therapy: Secondary | ICD-10-CM | POA: Diagnosis not present

## 2021-06-08 DIAGNOSIS — Z6837 Body mass index (BMI) 37.0-37.9, adult: Secondary | ICD-10-CM | POA: Diagnosis not present

## 2021-06-08 DIAGNOSIS — L932 Other local lupus erythematosus: Secondary | ICD-10-CM | POA: Diagnosis not present

## 2021-06-08 DIAGNOSIS — G0490 Encephalitis and encephalomyelitis, unspecified: Secondary | ICD-10-CM | POA: Diagnosis not present

## 2021-06-09 NOTE — Telephone Encounter (Signed)
No additional notes

## 2021-06-10 ENCOUNTER — Other Ambulatory Visit: Payer: Self-pay | Admitting: Cardiology

## 2021-06-11 ENCOUNTER — Encounter: Payer: Self-pay | Admitting: Cardiology

## 2021-06-12 NOTE — Telephone Encounter (Signed)
Prescription refill request for Eliquis received. Indication:Afib Last office visit:12/22 Scr:0.5 Age: 81 Weight:98.9 kg  Prescription refilled

## 2021-06-14 ENCOUNTER — Telehealth: Payer: Self-pay

## 2021-06-14 ENCOUNTER — Ambulatory Visit (INDEPENDENT_AMBULATORY_CARE_PROVIDER_SITE_OTHER): Payer: Medicare PPO

## 2021-06-14 ENCOUNTER — Other Ambulatory Visit: Payer: Self-pay

## 2021-06-14 ENCOUNTER — Encounter: Payer: Self-pay | Admitting: Family

## 2021-06-14 ENCOUNTER — Ambulatory Visit: Payer: Medicare PPO | Admitting: Family

## 2021-06-14 DIAGNOSIS — M25571 Pain in right ankle and joints of right foot: Secondary | ICD-10-CM

## 2021-06-14 NOTE — Telephone Encounter (Signed)
Patient called stating that she is having right ankle pain and would like to worked in to see Dr. Ninfa Linden or Artis Delay.  CB# [phone].  Please advise.  Thank you

## 2021-06-14 NOTE — Telephone Encounter (Signed)
Please advise 

## 2021-06-14 NOTE — Progress Notes (Signed)
Office Visit Note   Patient: Lindsay Clark           Date of Birth: 01-Dec-1940           MRN: [id number] Visit Date: 06/14/2021              Requested by: [name], [place],  [place] [id number] PCP: [name], MD  Chief Complaint  Patient presents with   Right Ankle - Pain      HPI: Lindsay Clark is a 81 year old woman for right ankle pain she was ambulating in the home when she suddenly had onset of distal fibula pain.  She did have an injury to her left fibula where she was walking and without rolling her ankle without falling without injury sustained a fracture of the left distal fibula.  Today she is concerned she may have done the same thing on the right  She is able to bear weight in regular shoewear.  She has point tenderness without bruising no swelling this is associated with deep pain  She states that she has issues with muscle tone and frequently rolls her ankles.  Has had frequent falls.  Assessment & Plan: Visit Diagnoses:  1. Pain in right ankle and joints of right foot     Plan: Radiographs today unequivocal will refer for CT scan to rule out ankle fracture on the right. Right ankle Ligamentously stable and nontender.  Follow-Up Instructions: No follow-ups on file.   Right Ankle Exam   Tenderness  Right ankle tenderness location: distal fibula shaft point tenderness. Swelling: mild  Range of Motion  The patient has normal right ankle ROM.  Muscle Strength  The patient has normal right ankle strength.  Tests  Anterior drawer: negative  Other  Erythema: absent Pulse: present      Patient is alert, oriented, no adenopathy, well-dressed, normal affect, normal respiratory effort.   Imaging: XR Ankle 2 Views Right  Result Date: 06/14/2021 Radiographs of right ankle show no acute finding. Mortise is congruent.   No images are attached to the encounter.  Labs: Lab Results  Component Value Date    GRAMSTAIN CYTOSPIN SLIDE 10/08/2014   GRAMSTAIN No WBC Seen 10/08/2014   GRAMSTAIN No Organisms Seen 10/08/2014   GRAMSTAIN CYTOSPIN SLIDE 10/08/2014   GRAMSTAIN No WBC Seen 10/08/2014   GRAMSTAIN No Organisms Seen 10/08/2014   LABORGA NO ANAEROBES ISOLATED 10/08/2014   LABORGA NO GROWTH 3 DAYS 10/08/2014   LABORGA No Fungi Isolated in 4 Weeks 10/08/2014     Lab Results  Component Value Date   ALBUMIN 4.0 04/18/2016   ALBUMIN 3.8 10/20/2014    No results found for: MG No results found for: VD25OH  No results found for: PREALBUMIN CBC EXTENDED Latest Ref Rng & Units 07/29/2020 03/24/2020 05/13/2019  WBC 4.0 - 10.5 K/uL 6.0 5.6 6.5  RBC 3.87 - 5.11 MIL/uL 4.80 4.72 4.70  HGB 12.0 - 15.0 g/dL 14.7 14.7 14.5  HCT 36.0 - 46.0 % 45.5 42.8 42.7  PLT 150 - 400 K/uL 200 217 194  NEUTROABS 1.7 - 7.7 K/uL - - -  LYMPHSABS 0.7 - 4.0 K/uL - - -     There is no height or weight on file to calculate BMI.  Orders:  Orders Placed This Encounter  Procedures   XR Ankle 2 Views Right   CT ANKLE RIGHT WO CONTRAST   No orders of the defined types were placed in this encounter.  Procedures: No procedures performed  Clinical Data: No additional findings.  ROS:  All other systems negative, except as noted in the HPI. Review of Systems  Objective: Vital Signs: There were no vitals taken for this visit.  Specialty Comments:  No specialty comments available.  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   Left knee pain 08/09/2020   Persistent atrial fibrillation ([place]) 04/05/2020   Secondary hypercoagulable state ([place]) 04/05/2020   Primary osteoarthritis of left knee 09/02/2019   Prolapsed internal hemorrhoids, grade 2 03/03/2019   Brain lesion 06/24/2017   Osteoarthritis    Multiple lesions on CT of brain and spine    Movement disorder    Lumbar spondylosis    Hyperlipidemia    DDD  (degenerative disc disease), lumbar    Dermatitis    Cervical spondylosis    Cervical disc disease    Diastolic dysfunction 26/71/2458   Syncope 04/18/2016   Anxiety 04/18/2016   Hypertension    Autoimmune disorder-autoimmune encephalitis    Dystonia 03/16/2015   Coronary artery calcification of native artery 11/04/2014   Abnormal finding on MRI of brain 10/04/2014   Pruritus 09/98/3382   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   Past Medical History:  Diagnosis Date   Atherosclerosis    mild cerebral   Atrial fibrillation and flutter (HCC)    Autoimmune disorder (HCC)    Autoimmune encephalitis    Cervical disc disease    with spondylosis   Cervical spondylosis    Chronic dermatitis    eczematous   Colitis    COVID 10/2020   DDD (degenerative disc disease), lumbar    Frozen shoulder    left   Hyperlipidemia    Hypertension    IBS (irritable bowel syndrome)    with diarrhea   Lateral epicondylitis    Lumbar spondylosis    Lupus (HCC)    Movement disorder    Multiple lesions on CT of brain and spine    reports lesions from previous CT scan at [facility]    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   PONV (postoperative nausea and vomiting)    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   Torn meniscus    left    Family History  Problem Relation Age of Onset   Cancer Mother 78       colon cancer   Stroke Mother    Colon cancer Mother 74   Heart attack Father 51   Emphysema Father    Cancer Maternal Grandmother 34       uterine   Thyroid disease Maternal Grandmother    Thrombosis Maternal Grandfather    Thyroid disease Son     Past Surgical History:  Procedure Laterality Date   A FLUTTER ABLATION  08/2020   [facility]   CATARACT EXTRACTION     COLONOSCOPY     EYE SURGERY Left    HEMORRHOID BANDING     KNEE ARTHROSCOPY Left 08/09/2020   Procedure: LEFT KNEE ARTHROSCOPY MEDIAL AND LATERAL MENISECTONMY AND CONDROPLASTY;  Surgeon: Melrose Nakayama, MD;  Location: [facility];  Service: Orthopedics;  Laterality: Left;   LOOP RECORDER INSERTION N/A 07/05/2017   Procedure: LOOP RECORDER INSERTION;  Surgeon: Thompson Grayer, MD;  Location: [facility];  Service: Cardiovascular;  Laterality: N/A;   TUBAL LIGATION  1980   WISDOM TOOTH EXTRACTION     Social History   Occupational History   Occupation:  Retired    Fish farm manager: OTHER  Tobacco Use   Smoking status: Never   Smokeless tobacco: Never  Vaping Use   Vaping Use: Never used  Substance and Sexual Activity   Alcohol use: Yes    Alcohol/week: 4.0 standard drinks    Types: 4 Glasses of wine per week    Comment: 4 glasses of wine weekly   Drug use: No   Sexual activity: Not Currently

## 2021-06-14 NOTE — Telephone Encounter (Signed)
Called and scheduled pt to see erin today

## 2021-06-17 ENCOUNTER — Ambulatory Visit (HOSPITAL_COMMUNITY)
Admission: RE | Admit: 2021-06-17 | Discharge: 2021-06-17 | Disposition: A | Discharge status: Home / Self Care | Payer: Medicare PPO | Source: Ambulatory Visit | Attending: Family | Admitting: Family

## 2021-06-17 ENCOUNTER — Other Ambulatory Visit: Payer: Self-pay

## 2021-06-17 DIAGNOSIS — M19071 Primary osteoarthritis, right ankle and foot: Secondary | ICD-10-CM | POA: Diagnosis not present

## 2021-06-17 DIAGNOSIS — M25571 Pain in right ankle and joints of right foot: Secondary | ICD-10-CM | POA: Insufficient documentation

## 2021-06-17 DIAGNOSIS — R600 Localized edema: Secondary | ICD-10-CM | POA: Diagnosis not present

## 2021-06-21 ENCOUNTER — Encounter: Payer: Self-pay | Admitting: Orthopaedic Surgery

## 2021-06-21 ENCOUNTER — Ambulatory Visit: Payer: Medicare PPO | Admitting: Orthopaedic Surgery

## 2021-06-21 DIAGNOSIS — M25571 Pain in right ankle and joints of right foot: Secondary | ICD-10-CM

## 2021-06-21 MED ORDER — PREDNISONE 50 MG PO TABS: ORAL_TABLET | ORAL | 0 refills | Status: DC

## 2021-06-21 NOTE — Progress Notes (Signed)
Mrs. Galvao comes in today to go over CT scan of her right ankle.  A week ago she felt significant pain in that right ankle and came in today after for x-rays and a CT scan.  These were both negative for any type of fracture.  There was soft tissue edema in the anterior lateral ankle.  She does have a history of a fibula fracture on the left ankle that occurred in November.  Artis Delay was able to diagnose this off of an x-ray easily and then followed up with plain films which showed that the fibula fracture eventually healed.  Of interest she has had bone density studies recently that are read as normal with no evidence of osteoporosis.  She has been wearing a lace up ankle brace on the right ankle.  She points to the fibula proximal to the ankle joint is where her pain is.  This is actually consistent with the break and pain that she had on the left side. ? ?She still has pain over the fibula on the right side above the ankle mortise.  I did review the plain films from last week on 23 February and the CT scan the same day and she has no evidence of fracture.  There is some soft tissue swelling.  I do feel that this still could be a stress response that she is having or even a stress fracture. ? ?She will continue using her lace up ankle support and I will send in some prednisone since she cannot take anti-inflammatories.  I would like her to see Artis Delay in 2 weeks and will have a repeat 3 views of the right ankle.  If there is a stress fracture is a likely show up on plain film by then.  All questions and concerns were answered and addressed. ?

## 2021-06-22 ENCOUNTER — Ambulatory Visit: Payer: Medicare PPO | Admitting: Orthopaedic Surgery

## 2021-07-04 ENCOUNTER — Encounter: Payer: Medicare PPO | Admitting: Physical Medicine & Rehabilitation

## 2021-07-05 ENCOUNTER — Encounter: Payer: Self-pay | Admitting: Physician Assistant

## 2021-07-05 ENCOUNTER — Ambulatory Visit (INDEPENDENT_AMBULATORY_CARE_PROVIDER_SITE_OTHER): Payer: Medicare PPO

## 2021-07-05 ENCOUNTER — Ambulatory Visit: Payer: Medicare PPO | Admitting: Physician Assistant

## 2021-07-05 DIAGNOSIS — M25571 Pain in right ankle and joints of right foot: Secondary | ICD-10-CM

## 2021-07-05 DIAGNOSIS — S82831D Other fracture of upper and lower end of right fibula, subsequent encounter for closed fracture with routine healing: Secondary | ICD-10-CM

## 2021-07-05 NOTE — Progress Notes (Signed)
? ? ? ? ? ? ? ? ? ? ? ? ? ? ?Office Visit Note ?  ?Patient: Lindsay Clark           ?Date of Birth: 1941-02-23           ?MRN: [id number] ?Visit Date: 07/05/2021 ?             ?Requested by: [name], MD ?[address] ?[address] ?[place],  [place] [address] ?PCP: [name], MD ? ? ?Assessment & Plan: ?Visit Diagnoses:  ?1. Pain in right ankle and joints of right foot   ?2. Closed fracture of distal end of right fibula with routine healing, unspecified fracture morphology, subsequent encounter   ? ? ?Plan: Given her stress fracture involving the right distal fibula at this point time and no significant injury recommend vitamin D D check as she reports that her bone density scan is normal.  She will have this per her request done in her primary care physician next week.  Discussed with her treatment she does not want to go in a cam walker boot she has a pullover ankle sleeve she can wear this.  She also is using a cane to offload the right ankle.  We will see her back in 4 weeks repeat x-rays of the right ankle 3 views.  Questions were encouraged and answered at length. ? ?Follow-Up Instructions: Return in about 4 weeks (around 08/02/2021) for Radiographs.  ? ?Orders:  ?Orders Placed This Encounter  ?Procedures  ? XR Ankle Complete Right  ? XR Tibia/Fibula Right  ? ?No orders of the defined types were placed in this encounter. ? ? ? ? Procedures: ?No procedures performed ? ? ?Clinical Data: ?No additional findings. ? ? ?Subjective: ?Chief Complaint  ?Patient presents with  ? Right Ankle - Follow-up  ? ? ?HPI ?Patient comes in today for follow-up of her right ankle.  States pain is now radiating up into her right upper tib-fib region.  No new injuries.  In fact she has had no injury to the ankle.  Pain started approximately 2 weeks ago.  She is using a cane to ambulate. ? ?Review of Systems ?See HPI otherwise negative ? ?Objective: ?Vital Signs: There were no vitals taken for this visit. ? ?Physical  Exam ?General well-developed well-nourished female no acute distress ?Ortho Exam ?Right ankle: Tenderness distal right fibula just proximal to the lateral malleolus.  No ecchymosis erythema.  No significant edema.  She has no tenderness proximal tib-fib right calf supple nontender.  Good range of motion the right knee. ?Specialty Comments:  ?No specialty comments available. ? ?Imaging: ?XR Ankle Complete Right ? ?Result Date: 07/05/2021 ?Right ankle 3 views talus well located within the ankle mortise.  Stress fracture involving the distal one fourth of the fibular shaft.  Otherwise no acute bony abnormalities. ? ?XR Tibia/Fibula Right ? ?Result Date: 07/05/2021 ?Right tib-fib: Right ankle and right knee well located.  Stress fracture involving the distal one fourth metaphysis of the fibula.  Otherwise no acute fractures or acute findings.  ? ? ?PMFS History: ?Patient Active Problem List  ? Diagnosis Date Noted  ? 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  ? Left knee pain 08/09/2020  ? Persistent atrial fibrillation ([place]) 04/05/2020  ? Secondary hypercoagulable state ([place]) 04/05/2020  ? Primary osteoarthritis of left knee 09/02/2019  ? Prolapsed internal hemorrhoids, grade 2 03/03/2019  ? Brain lesion 06/24/2017  ? Osteoarthritis   ? Multiple lesions on CT of brain  and spine   ? Movement disorder   ? Lumbar spondylosis   ? Hyperlipidemia   ? DDD (degenerative disc disease), lumbar   ? Dermatitis   ? Cervical spondylosis   ? Cervical disc disease   ? Diastolic dysfunction 59/93/5701  ? Syncope 04/18/2016  ? Anxiety 04/18/2016  ? Hypertension   ? Autoimmune disorder-autoimmune encephalitis   ? Dystonia 03/16/2015  ? Coronary artery calcification of native artery 11/04/2014  ? Abnormal finding on MRI of brain 10/04/2014  ? Pruritus 11/30/2011  ? 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  ? ?Past Medical History:  ?Diagnosis Date  ? Atherosclerosis   ? mild cerebral  ? Atrial fibrillation and flutter ([place])   ?  Autoimmune disorder ([place])   ? Autoimmune encephalitis   ? Cervical disc disease   ? with spondylosis  ? Cervical spondylosis   ? Chronic dermatitis   ? eczematous  ? Colitis   ? COVID 10/2020  ? DDD (degenerative disc disease), lumbar   ? Frozen shoulder   ? left  ? Hyperlipidemia   ? Hypertension   ? IBS (irritable bowel syndrome)   ? with diarrhea  ? Lateral epicondylitis   ? Lumbar spondylosis   ? Lupus ([place])   ? Movement disorder   ? Multiple lesions on CT of brain and spine   ? reports lesions from previous CT scan [facility]   ? Osteoarthritis   ? in her hands  ? PONV (postoperative nausea and vomiting)   ? 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  ? Torn meniscus   ? left  ?  ?Family History  ?Problem Relation Age of Onset  ? Cancer Mother 51  ?     colon cancer  ? Stroke Mother   ? Colon cancer Mother 56  ? Heart attack Father 65  ? Emphysema Father   ? Cancer Maternal Grandmother 4  ?     uterine  ? Thyroid disease Maternal Grandmother   ? Thrombosis Maternal Grandfather   ? Thyroid disease Son   ?  ?Past Surgical History:  ?Procedure Laterality Date  ? A FLUTTER ABLATION  08/2020  ? [facility]  ? CATARACT EXTRACTION    ? COLONOSCOPY    ? EYE SURGERY Left   ? HEMORRHOID BANDING    ? KNEE ARTHROSCOPY Left 08/09/2020  ? Procedure: LEFT KNEE ARTHROSCOPY MEDIAL AND LATERAL [place] AND CONDROPLASTY;  Surgeon: Melrose Nakayama, MD;  Location: [facility];  Service: Orthopedics;  Laterality: Left;  ? LOOP RECORDER INSERTION N/A 07/05/2017  ? Procedure: LOOP RECORDER INSERTION;  Surgeon: Thompson Grayer, MD;  Location: [place] [facility];  Service: Cardiovascular;  Laterality: N/A;  ? [place]  ? WISDOM TOOTH EXTRACTION    ? ?Social History  ? ?Occupational History  ? Occupation: Retired  ?  Employer: OTHER  ?Tobacco Use  ? Smoking status: Never  ? Smokeless tobacco: Never  ?Vaping Use  ? Vaping Use: Never used  ?Substance and Sexual Activity  ? Alcohol use: Yes  ?  Alcohol/week: 4.0 standard drinks   ?  Types: 4 Glasses of wine per week  ?  Comment: 4 glasses of wine weekly  ? Drug use: No  ? Sexual activity: Not Currently  ? ? ? ? ? ? ? ? ? ? ?1 ?

## 2021-07-11 DIAGNOSIS — N898 Other specified noninflammatory disorders of vagina: Secondary | ICD-10-CM | POA: Diagnosis not present

## 2021-07-11 DIAGNOSIS — N952 Postmenopausal atrophic vaginitis: Secondary | ICD-10-CM | POA: Diagnosis not present

## 2021-07-11 DIAGNOSIS — B372 Candidiasis of skin and nail: Secondary | ICD-10-CM | POA: Diagnosis not present

## 2021-07-18 DIAGNOSIS — Z8781 Personal history of (healed) traumatic fracture: Secondary | ICD-10-CM | POA: Diagnosis not present

## 2021-07-26 ENCOUNTER — Encounter: Payer: Self-pay | Admitting: Orthopaedic Surgery

## 2021-07-26 ENCOUNTER — Ambulatory Visit: Payer: Medicare PPO | Admitting: Orthopaedic Surgery

## 2021-07-26 ENCOUNTER — Ambulatory Visit (INDEPENDENT_AMBULATORY_CARE_PROVIDER_SITE_OTHER): Payer: Medicare PPO

## 2021-07-26 DIAGNOSIS — S82831D Other fracture of upper and lower end of right fibula, subsequent encounter for closed fracture with routine healing: Secondary | ICD-10-CM

## 2021-07-26 DIAGNOSIS — R0781 Pleurodynia: Secondary | ICD-10-CM

## 2021-07-26 NOTE — Progress Notes (Signed)
Mrs. Bensen comes in today after having a mechanical fall injuring the right side of her ribs.  She has been having no shortness of breath but certainly pain with coughing and deep breathing.  This was an accidental fall.  Since she is here we will do an x-ray of her right ankle today which she has been dealing with a stress fracture of her fibula.  That is feeling better overall. ? ?3 views of the right ankle show that the stress fracture is almost healed completely.  There is abundant callus formation.  There is no malalignment.  X-rays of the ribs show no obvious fracture.  On clinic exam she is very sore along the right lateral ribs.  This certainly could be an contributive of a rib contusion.  I cannot rule out a fracture given her past history of fractures.  The lung markings on her x-ray are normal.  There is no breathing issues. ? ?She will continue increase her activities as comfort allows.  She does not need to see Artis Delay next week.  We do not need to x-ray the ankle again.  At her next appointment which is likely in May is when she will have injections again in her knees.  All questions and concerns were answered and addressed. ?

## 2021-08-02 ENCOUNTER — Ambulatory Visit: Payer: Medicare PPO | Admitting: Physician Assistant

## 2021-08-03 ENCOUNTER — Ambulatory Visit: Payer: Medicare PPO | Admitting: Physician Assistant

## 2021-08-08 ENCOUNTER — Other Ambulatory Visit: Payer: Self-pay | Admitting: Cardiology

## 2021-08-08 DIAGNOSIS — R609 Edema, unspecified: Secondary | ICD-10-CM

## 2021-08-10 DIAGNOSIS — H5203 Hypermetropia, bilateral: Secondary | ICD-10-CM | POA: Diagnosis not present

## 2021-08-10 DIAGNOSIS — H1789 Other corneal scars and opacities: Secondary | ICD-10-CM | POA: Diagnosis not present

## 2021-08-10 DIAGNOSIS — H2512 Age-related nuclear cataract, left eye: Secondary | ICD-10-CM | POA: Diagnosis not present

## 2021-08-10 DIAGNOSIS — Z961 Presence of intraocular lens: Secondary | ICD-10-CM | POA: Diagnosis not present

## 2021-08-17 ENCOUNTER — Encounter: Payer: Self-pay | Admitting: Physical Medicine & Rehabilitation

## 2021-08-17 ENCOUNTER — Encounter: Payer: Medicare PPO | Attending: Physical Medicine & Rehabilitation | Admitting: Physical Medicine & Rehabilitation

## 2021-08-17 VITALS — BP 137/77 | HR 75 | Temp 97.8°F | Ht 64.0 in | Wt 219.0 lb

## 2021-08-17 DIAGNOSIS — G248 Other dystonia: Secondary | ICD-10-CM | POA: Insufficient documentation

## 2021-08-17 NOTE — Progress Notes (Signed)
Right lower extremity ultrasound injection under ultrasound guidance ?Indication focal dystonia right lower extremity ? ?Botox dilution 50 units/cc ? ?Linear ultrasound utilized to identify tibialis posterior between fibula and tibia ?Area marked and prepped with Betadine sterile ultrasound gel utilized ? ?50 mm 25-gauge needle electrode was inserted under direct long axis ultrasound visualization.  Tibialis posterior identified, confirmed with electrical stimulation ?100 units were injected in a fanlike pattern. ? ?Patient tolerated procedure well ?Post procedure instructions given ?

## 2021-08-17 NOTE — Patient Instructions (Signed)

## 2021-09-04 ENCOUNTER — Ambulatory Visit: Payer: Medicare PPO | Admitting: Orthopaedic Surgery

## 2021-09-04 ENCOUNTER — Encounter: Payer: Self-pay | Admitting: Orthopaedic Surgery

## 2021-09-04 DIAGNOSIS — M25561 Pain in right knee: Secondary | ICD-10-CM | POA: Diagnosis not present

## 2021-09-04 DIAGNOSIS — G8929 Other chronic pain: Secondary | ICD-10-CM | POA: Diagnosis not present

## 2021-09-04 DIAGNOSIS — M25562 Pain in left knee: Secondary | ICD-10-CM

## 2021-09-04 MED ORDER — LIDOCAINE HCL 1 % IJ SOLN
3.0000 mL | INTRAMUSCULAR | Status: AC | PRN
  Administered 2021-09-04: 3 mL

## 2021-09-04 MED ORDER — METHYLPREDNISOLONE ACETATE 40 MG/ML IJ SUSP
40.0000 mg | INTRAMUSCULAR | Status: AC | PRN
  Administered 2021-09-04: 40 mg via INTRA_ARTICULAR

## 2021-09-04 NOTE — Progress Notes (Signed)
? ?Office Visit Note ?  ?Patient: Lindsay Clark           ?Date of Birth: April 06, 1941           ?MRN: [id number] ?Visit Date: 09/04/2021 ?             ?Requested by: [name], MD ?[address] ?[address] ?[place],  [place] [address] ?PCP: [name], MD ? ? ?Assessment & Plan: ?Visit Diagnoses:  ?1. Chronic pain of right knee   ?2. Chronic pain of left knee   ? ? ?Plan: I was able to aspirate about 30 cc of fluid from her left knee and then place a steroid injection in both knees which she tolerated well.  At some point she plans to proceed with a left knee replacement.  All question concerns were answered and addressed.  For now follow-up is as needed. ? ?Follow-Up Instructions: Return if symptoms worsen or fail to improve.  ? ?Orders:  ?Orders Placed This Encounter  ?Procedures  ? Large Joint Inj  ? Large Joint Inj  ? ?No orders of the defined types were placed in this encounter. ? ? ? ? Procedures: ?Large Joint Inj: R knee on 09/04/2021 11:07 AM ?Indications: diagnostic evaluation and pain ?Details: 22 G 1.5 in needle, superolateral approach ? ?Arthrogram: No ? ?Medications: 3 mL lidocaine 1 %; 40 mg methylPREDNISolone acetate 40 MG/ML ?Outcome: tolerated well, no immediate complications ?Procedure, treatment alternatives, risks and benefits explained, specific risks discussed. Consent was given by the patient. Immediately prior to procedure a time out was called to verify the correct patient, procedure, equipment, support staff and site/side marked as required. Patient was prepped and draped in the usual sterile fashion.  ? ? ?Large Joint Inj: L knee on 09/04/2021 11:07 AM ?Indications: diagnostic evaluation and pain ?Details: 22 G 1.5 in needle, superolateral approach ? ?Arthrogram: No ? ?Medications: 3 mL lidocaine 1 %; 40 mg methylPREDNISolone acetate 40 MG/ML ?Outcome: tolerated well, no immediate complications ?Procedure, treatment alternatives, risks and benefits explained, specific risks  discussed. Consent was given by the patient. Immediately prior to procedure a time out was called to verify the correct patient, procedure, equipment, support staff and site/side marked as required. Patient was prepped and draped in the usual sterile fashion.  ? ? ? ? ?Clinical Data: ?No additional findings. ? ? ?Subjective: ?Chief Complaint  ?Patient presents with  ? Left Knee - Follow-up  ? Right Knee - Follow-up  ?The patient is well-known to me.  She has significant arthritis in both knees but is much worse on the left knee with valgus malalignment.  Her and her husband are heading to [facility] later this week.  She is needing a steroid injection in both her knees.  She does feel like her left knee needs to be aspirated. ? ?HPI ? ?Review of Systems ? ? ?Objective: ?Vital Signs: There were no vitals taken for this visit. ? ?Physical Exam ?She is alert and orient x3 and in no acute distress ?Ortho Exam ?Examination of the left knee does show a moderate effusion.  There is valgus malalignment.  Her right knee has no effusion. ?Specialty Comments:  ?No specialty comments available. ? ?Imaging: ?No results found. ? ? ?PMFS History: ?Patient Active Problem List  ? Diagnosis Date Noted  ? 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  ? Left knee pain 08/09/2020  ? Persistent atrial fibrillation ([place]) 04/05/2020  ? Secondary hypercoagulable state ([place]) 04/05/2020  ? Primary osteoarthritis of left  knee 09/02/2019  ? Prolapsed internal hemorrhoids, grade 2 03/03/2019  ? Brain lesion 06/24/2017  ? Osteoarthritis   ? Multiple lesions on CT of brain and spine   ? Movement disorder   ? Lumbar spondylosis   ? Hyperlipidemia   ? DDD (degenerative disc disease), lumbar   ? Dermatitis   ? Cervical spondylosis   ? Cervical disc disease   ? Diastolic dysfunction 16/04/930  ? Syncope 04/18/2016  ? Anxiety 04/18/2016  ? Hypertension   ? Autoimmune disorder-autoimmune encephalitis   ? Dystonia 03/16/2015  ? Coronary artery  calcification of native artery 11/04/2014  ? Abnormal finding on MRI of brain 10/04/2014  ? Pruritus 11/30/2011  ? 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  ? ?Past Medical History:  ?Diagnosis Date  ? Atherosclerosis   ? mild cerebral  ? Atrial fibrillation and flutter ([place])   ? Autoimmune disorder ([place])   ? Autoimmune encephalitis   ? Cervical disc disease   ? with spondylosis  ? Cervical spondylosis   ? Chronic dermatitis   ? eczematous  ? Colitis   ? COVID 10/2020  ? DDD (degenerative disc disease), lumbar   ? Frozen shoulder   ? left  ? Hyperlipidemia   ? Hypertension   ? IBS (irritable bowel syndrome)   ? with diarrhea  ? Lateral epicondylitis   ? Lumbar spondylosis   ? Lupus ([place])   ? Movement disorder   ? Multiple lesions on CT of brain and spine   ? reports lesions from [facility]   ? Osteoarthritis   ? in her hands  ? PONV (postoperative nausea and vomiting)   ? 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  ? Torn meniscus   ? left  ?  ?Family History  ?Problem Relation Age of Onset  ? Cancer Mother 40  ?     colon cancer  ? Stroke Mother   ? Colon cancer Mother 103  ? Heart attack Father 31  ? Emphysema Father   ? Cancer Maternal Grandmother 2  ?     uterine  ? Thyroid disease Maternal Grandmother   ? Thrombosis Maternal Grandfather   ? Thyroid disease Son   ?  ?Past Surgical History:  ?Procedure Laterality Date  ? A FLUTTER ABLATION  08/2020  ? [facility]  ? CATARACT EXTRACTION    ? COLONOSCOPY    ? EYE SURGERY Left   ? HEMORRHOID BANDING    ? KNEE ARTHROSCOPY Left 08/09/2020  ? Procedure: LEFT KNEE ARTHROSCOPY MEDIAL AND LATERAL [employer] AND CONDROPLASTY;  Surgeon: Melrose Nakayama, MD;  [facility];  Service: Orthopedics;  Laterality: Left;  ? LOOP RECORDER INSERTION N/A 07/05/2017  ? Procedure: LOOP RECORDER INSERTION;  Surgeon: Thompson Grayer, MD;  Location: [place] [facility];  Service: Cardiovascular;  Laterality: N/A;  ? [place]  ? WISDOM TOOTH EXTRACTION     ? ?Social History  ? ?Occupational History  ? Occupation: Retired  ?  Employer: OTHER  ?Tobacco Use  ? Smoking status: Never  ? Smokeless tobacco: Never  ?Vaping Use  ? Vaping Use: Never used  ?Substance and Sexual Activity  ? Alcohol use: Yes  ?  Alcohol/week: 4.0 standard drinks  ?  Types: 4 Glasses of wine per week  ?  Comment: 4 glasses of wine weekly  ? Drug use: No  ? Sexual activity: Not Currently  ? ? ? ? ? ? ?

## 2021-09-06 DIAGNOSIS — G249 Dystonia, unspecified: Secondary | ICD-10-CM | POA: Diagnosis not present

## 2021-09-26 DIAGNOSIS — Z03818 Encounter for observation for suspected exposure to other biological agents ruled out: Secondary | ICD-10-CM | POA: Diagnosis not present

## 2021-09-26 DIAGNOSIS — J019 Acute sinusitis, unspecified: Secondary | ICD-10-CM | POA: Diagnosis not present

## 2021-10-04 NOTE — Progress Notes (Unsigned)
HPI: FU PAF. 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 Patient seen in September 2017 with complaints of palpitations and near syncope. Patient was scheduled for an exercise treadmill and when she came she was noted to have runs of paroxysmal atrial tachycardia/fibrillation. Procedure was canceled. Monitor October 2018 showed paroxysmal atririllation and flutter. Nuclear study October 2018 showed ejection fraction 67%, apical thinning but no ischemia. Placed on apixaban.  Had implantable loop recorder placed by Dr. Rayann Heman for syncope and recurrent palpitations. She has been found to have "heart pounding" episodes not related to arrhythmia. Echo repeated 2/21 showed normal LV function, grade 1 DD, mild to moderate LAE. Coronary CTA 2/21 showed Ca score 36 and minimal (0-24%) in proximal LM, proximal and mid LAD and proximal Lcx.  Patient had atrial fibrillation ablation at [facility] May 2022.  Since last seen,   Current Outpatient Medications  Medication Sig Dispense Refill   cholecalciferol (VITAMIN D3) 25 MCG (1000 UNIT) tablet Take 2,000 Units by mouth daily.     ELIQUIS 5 MG TABS tablet TAKE ONE TABLET BY MOUTH TWICE DAILY 60 tablet 5   furosemide (LASIX) 40 MG tablet TAKE TWO TABLETS BY MOUTH DAILY. 180 tablet 3   hydroxychloroquine (PLAQUENIL) 200 MG tablet Take 200 mg by mouth 2 (two) times daily.     hydrOXYzine (ATARAX/VISTARIL) 10 MG tablet Take 1 tablet by mouth daily.     metoprolol succinate (TOPROL-XL) 25 MG 24 hr tablet Take 1 tablet (25 mg total) by mouth 2 (two) times daily. 60 tablet 5   Multiple Vitamin (MULTIVITAMIN WITH MINERALS) TABS tablet Take 1 tablet by mouth in the morning. One-A-Day for Women 50+     potassium chloride (KLOR-CON) 10 MEQ tablet Take 20 mEq by mouth daily with breakfast.     Propylene Glycol 0.6 % SOLN Place 1 drop into both eyes at bedtime.     No current facility-administered medications for this visit.     Past Medical History:   Diagnosis Date   Atherosclerosis    mild cerebral   Atrial fibrillation and flutter ([facility])    Autoimmune disorder ([facility])    Autoimmune encephalitis    Cervical disc disease    with spondylosis   Cervical spondylosis    Chronic dermatitis    eczematous   Colitis    COVID 10/2020   DDD (degenerative disc disease), lumbar    Frozen shoulder    left   Hyperlipidemia    Hypertension    IBS (irritable bowel syndrome)    with diarrhea   Lateral epicondylitis    Lumbar spondylosis    Lupus (HCC)    Movement disorder    Multiple lesions on CT of brain and spine    reports lesions from previous CT [facility] at [facility]    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   PONV (postoperative nausea and vomiting)    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   Torn meniscus    left    Past Surgical History:  Procedure Laterality Date   A FLUTTER ABLATION  08/2020   [facility]   CATARACT EXTRACTION     COLONOSCOPY     EYE SURGERY Left    HEMORRHOID BANDING     KNEE ARTHROSCOPY Left 08/09/2020   Procedure: LEFT KNEE ARTHROSCOPY MEDIAL AND LATERAL [place] AND CONDROPLASTY;  Surgeon: Melrose Nakayama, MD;  [facility];  Service: Orthopedics;  Laterality: Left;   LOOP RECORDER INSERTION N/A 07/05/2017  Procedure: LOOP RECORDER INSERTION;  Surgeon: Thompson Grayer, MD;  Location: [place] [facility];  Service: Cardiovascular;  Laterality: N/A;   TUBAL LIGATION  1980   WISDOM TOOTH EXTRACTION      Social History   Socioeconomic History   Marital status: Married    Spouse name: [name]   Number of children: 2   Years of education: MAx2   Highest education level: Not on file  Occupational History   Occupation: Retired    Fish farm manager: [employer]  Tobacco Use   Smoking status: Never   Smokeless tobacco: Never  Vaping Use   Vaping Use: Never used  Substance and Sexual Activity   Alcohol use: Yes    Alcohol/week: 4.0 standard drinks of alcohol    Types: 4 Glasses of wine per week    Comment: 4  glasses of wine weekly   Drug use: No   Sexual activity: Not Currently  Other Topics Concern   Not on file  Social History Narrative   Patient lives at home with her spouse.   Caffeine Use: 2-3 cups daily   Social Determinants of Health   Financial Resource Strain: Not on file  Food Insecurity: Not on file  Transportation Needs: Not on file  Physical Activity: Not on file  Stress: Not on file  Social Connections: Not on file  Intimate Partner Violence: Not on file    Family History  Problem Relation Age of Onset   Cancer Mother 68       colon cancer   Stroke Mother    Colon cancer Mother 19   Heart attack Father 50   Emphysema Father    Cancer Maternal Grandmother 68       uterine   Thyroid disease Maternal Grandmother    Thrombosis Maternal Grandfather    Thyroid disease Son     ROS: no fevers or chills, productive cough, hemoptysis, dysphasia, odynophagia, melena, hematochezia, dysuria, hematuria, rash, seizure activity, orthopnea, PND, pedal edema, claudication. Remaining systems are negative.  Physical Exam: Well-developed well-nourished in no acute distress.  Skin is warm and dry.  HEENT is normal.  Neck is supple.  Chest is clear to auscultation with normal expansion.  Cardiovascular exam is regular rate and rhythm.  Abdominal exam nontender or distended. No masses palpated. Extremities show no edema. neuro grossly intact  ECG- personally reviewed  A/P  1 paroxysmal atrial fibrillation-patient is status post ablation May 2022.  She previously asked for amiodarone to be discontinued which was done.  She remains in sinus rhythm.  We will continue Toprol and apixaban.  2 hypertension-blood pressure controlled.  Continue present medical regimen.  3 coronary artery disease-patient with history of coronary atherosclerosis.  She declined lipid-lowering medications and is not on aspirin due to need for anticoagulation.  4 history of syncope-no recurrences.  5  lower extremity edema-continue diuretic.  Kirk Ruths, MD

## 2021-10-05 ENCOUNTER — Encounter: Payer: Self-pay | Admitting: Physical Medicine & Rehabilitation

## 2021-10-05 ENCOUNTER — Encounter: Payer: Medicare PPO | Attending: Physical Medicine & Rehabilitation | Admitting: Physical Medicine & Rehabilitation

## 2021-10-05 VITALS — BP 111/70 | HR 74 | Ht 64.0 in

## 2021-10-05 DIAGNOSIS — G248 Other dystonia: Secondary | ICD-10-CM

## 2021-10-05 DIAGNOSIS — M21069 Valgus deformity, not elsewhere classified, unspecified knee: Secondary | ICD-10-CM

## 2021-10-05 NOTE — Progress Notes (Signed)
Subjective:    Patient ID: Lindsay Clark, female    DOB: 1941-03-22, 81 y.o.   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  She also has a history of bilateral knee osteoarthritis and flatfeet.  She resides at the independent living portion of [facility].  She is looking for guidance in regards to exercise in addition to management of abnormal tone in the right lower extremity.  The patient has access to a pool as well as exercise equipment.  She has tried bicycling on a stationary cycle but because of abnormal tone in the right foot slips off.  There are no toe sleeves. The patient has light weights 3 four 5 pound dumbbells as well as elastic bands. She remains functionally independent with all self-care and mobility.  She recently completed travel to [place] and [place]. In regards to her right lower extremity tone we discussed the results of botulinum toxin injection.  This was performed 6 weeks ago.  100 units were injected into the posterior tibialis under ultrasound guidance.  The patient states she had a good relief for 2 to 3 weeks.  We discussed that this is shorter than what would be anticipated.  This however was better than the injection that was performed in January which was done under e-stim guidance and was 75 units rather than 100 units.  Pain Inventory Average Pain 0 Pain Right Now 0 My pain is  NA  LOCATION OF PAIN  NA  BOWEL Number of stools per week: 7-14 Oral laxative use No   BLADDER Normal I  Mobility ability to climb steps?  yes do you drive?  yes Do you have any goals in this area?  yes  Function retired  Neuro/Psych weakness numbness trouble walking  Prior Studies Any changes since last visit?  no  Physicians involved in your care Any changes since last visit?  no   Family History  Problem Relation Age of Onset   Cancer  Mother 38       colon cancer   Stroke Mother    Colon cancer Mother 65   Heart attack Father 61   Emphysema Father    Cancer Maternal Grandmother 35       uterine   Thyroid disease Maternal Grandmother    Thrombosis Maternal Grandfather    Thyroid disease Son    Social History   Socioeconomic History   Marital status: Married    Spouse name: [name]   Number of children: 2   Years of education: MAx2   Highest education level: Not on file  Occupational History   Occupation: Retired    Fish farm manager: OTHER  Tobacco Use   Smoking status: Never   Smokeless tobacco: Never  Vaping Use   Vaping Use: Never used  Substance and Sexual Activity   Alcohol use: Yes    Alcohol/week: 4.0 standard drinks of alcohol    Types: 4 Glasses of wine per week    Comment: 4 glasses of wine weekly   Drug use: No   Sexual activity: Not Currently  Other Topics Concern   Not on file  Social History Narrative   Patient lives at home with her spouse.   Caffeine Use: 2-3 cups daily   Social Determinants of Health   Financial Resource Strain: Not on file  Food Insecurity: Not on file  Transportation Needs: Not on file  Physical Activity: Not on  file  Stress: Not on file  Social Connections: Not on file   Past Surgical History:  Procedure Laterality Date   A FLUTTER ABLATION  08/2020   [facility]   CATARACT EXTRACTION     COLONOSCOPY     EYE SURGERY Left    HEMORRHOID BANDING     KNEE ARTHROSCOPY Left 08/09/2020   Procedure: LEFT KNEE ARTHROSCOPY MEDIAL AND LATERAL MENISECTONMY AND CONDROPLASTY;  Surgeon: Melrose Nakayama, MD;  Location: [facility];  Service: Orthopedics;  Laterality: Left;   LOOP RECORDER INSERTION N/A 07/05/2017   Procedure: LOOP RECORDER INSERTION;  Surgeon: Thompson Grayer, MD;  Location: [facility];  Service: Cardiovascular;  Laterality: N/A;   TUBAL LIGATION  1980   WISDOM TOOTH EXTRACTION     Past Medical History:  Diagnosis Date   Atherosclerosis    mild cerebral    Atrial fibrillation and flutter (HCC)    Autoimmune disorder (HCC)    Autoimmune encephalitis    Cervical disc disease    with spondylosis   Cervical spondylosis    Chronic dermatitis    eczematous   Colitis    COVID 10/2020   DDD (degenerative disc disease), lumbar    Frozen shoulder    left   Hyperlipidemia    Hypertension    IBS (irritable bowel syndrome)    with diarrhea   Lateral epicondylitis    Lumbar spondylosis    Lupus (HCC)    Movement disorder    Multiple lesions on CT of brain and spine    reports lesions from previous CT scan at [facility]    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   PONV (postoperative nausea and vomiting)    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   Torn meniscus    left   There were no vitals taken for this visit.  Opioid Risk Score:   Fall Risk Score:  `1  Depression screen PHQ 2/9     08/17/2021   12:00 PM 05/11/2021   11:44 AM 03/14/2021   11:27 AM 01/19/2021   11:14 AM 01/09/2021   10:47 AM 10/04/2020   11:29 AM  Depression screen PHQ 2/9  Decreased Interest 0 0 0 0 0 0  Down, Depressed, Hopeless 0 0 0 0 0 0  PHQ - 2 Score 0 0 0 0 0 0  Tired, decreased energy    0    Change in appetite    0    Feeling bad or failure about yourself     0    Trouble concentrating    0    Moving slowly or fidgety/restless    0      Review of Systems  Musculoskeletal:        Dystonia   All other systems reviewed and are negative.      Objective:   Physical Exam Vitals and nursing note reviewed.  Constitutional:      Appearance: She is obese.  HENT:     Head: Normocephalic and atraumatic.  Eyes:     Extraocular Movements: Extraocular movements intact.     Conjunctiva/sclera: Conjunctivae normal.     Pupils: Pupils are equal, round, and reactive to light.  Musculoskeletal:     Right lower leg: No edema.     Left lower leg: No edema.  Skin:    General: Skin is warm and dry.  Neurological:     Mental Status: She is alert and  oriented to person, place, and time.  Psychiatric:  Mood and Affect: Mood normal.        Behavior: Behavior normal.   Musculoskeletal Cervical spine range of motion 75% of normal in flexion extension lateral bending and rotation Shoulder range of motion is full bilaterally elbow wrist and hand range of motion is full bilaterally Right hip range of motion reduced internal rotation approximately 75% of normal left hip range of motion is normal Knee and ankle range of motion is normal There is no evidence of joint deformity in the upper extremities There is bilateral valgus deformities at the knees and ankles There is evidence of Pez planus.  Neurologic Motor strength is 5/5 bilateral deltoid bicep tricep grip 4+ in the right hip flexor knee extensor ankle dorsiflexor 5/5 in left hip flexor knee extensor ankle dorsiflexor Patient has normal finger to thumb opposition bilaterally normal dysdiadochokinesis with rapid alternating supination pronation bilateral upper extremities Cerebellar exam shows mild dysmetria with right finger-nose to finger, normal on left side Tone There is no evidence of clonus in the upper extremities no evidence of of abnormal posturing MAS 0 at the elbow wrist and finger flexors bilaterally  Lower extremities normal deep tendon reflexes No evidence of clonus at the ankles There is intermittent foot and ankle inversion in the right lower extremity that is not sustained lasting a few seconds With ambulation there is no change in abnormal movements in fact patient does not have any foot inversion with ambulation.  She walks with bilateral knee and ankle valgus deformities as well as pes planus         Assessment & Plan:  1.  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  She has had short-term relief with botulinum toxin injection 1 performed under ultrasound  guidance with 100 units.  We discussed that repeated injections may result in a longer term relief.  Would recommend repeating tibialis posterior injection under ultrasound guidance with 100 units in a month and a half.  Would recommend for independent access points to spread the medication. 2.  Valgus deformity bilateral knees and ankles fortunately the patient does not have much pain with this.  We discussed that lower extremity strengthening should be in a reduced weight environment.  She would like to do pool walking which I would agree with.  He has no contraindications. In regards to upper extremity she continues dumbbells as well as elastic bands for strengthening.  We went over some specific exercises to work on biceps triceps elbow as well as back musculature.

## 2021-10-09 DIAGNOSIS — E785 Hyperlipidemia, unspecified: Secondary | ICD-10-CM | POA: Diagnosis not present

## 2021-10-09 DIAGNOSIS — R946 Abnormal results of thyroid function studies: Secondary | ICD-10-CM | POA: Diagnosis not present

## 2021-10-09 DIAGNOSIS — I10 Essential (primary) hypertension: Secondary | ICD-10-CM | POA: Diagnosis not present

## 2021-10-17 ENCOUNTER — Encounter: Payer: Self-pay | Admitting: Cardiology

## 2021-10-17 ENCOUNTER — Ambulatory Visit: Payer: Medicare PPO | Admitting: Cardiology

## 2021-10-17 VITALS — BP 138/72 | HR 76 | Ht 64.0 in | Wt 215.4 lb

## 2021-10-17 DIAGNOSIS — I2584 Coronary atherosclerosis due to calcified coronary lesion: Secondary | ICD-10-CM

## 2021-10-17 DIAGNOSIS — I480 Paroxysmal atrial fibrillation: Secondary | ICD-10-CM

## 2021-10-17 DIAGNOSIS — I10 Essential (primary) hypertension: Secondary | ICD-10-CM

## 2021-10-17 DIAGNOSIS — I2510 Atherosclerotic heart disease of native coronary artery without angina pectoris: Secondary | ICD-10-CM

## 2021-10-17 DIAGNOSIS — R609 Edema, unspecified: Secondary | ICD-10-CM

## 2021-10-18 ENCOUNTER — Other Ambulatory Visit: Payer: Self-pay | Admitting: Cardiology

## 2021-10-18 DIAGNOSIS — E559 Vitamin D deficiency, unspecified: Secondary | ICD-10-CM | POA: Diagnosis not present

## 2021-10-18 DIAGNOSIS — Z0000 Encounter for general adult medical examination without abnormal findings: Secondary | ICD-10-CM | POA: Diagnosis not present

## 2021-10-18 DIAGNOSIS — D6869 Other thrombophilia: Secondary | ICD-10-CM | POA: Diagnosis not present

## 2021-10-18 DIAGNOSIS — B372 Candidiasis of skin and nail: Secondary | ICD-10-CM | POA: Diagnosis not present

## 2021-10-18 DIAGNOSIS — I700 Atherosclerosis of aorta: Secondary | ICD-10-CM | POA: Diagnosis not present

## 2021-10-18 DIAGNOSIS — I672 Cerebral atherosclerosis: Secondary | ICD-10-CM | POA: Diagnosis not present

## 2021-10-18 DIAGNOSIS — I480 Paroxysmal atrial fibrillation: Secondary | ICD-10-CM | POA: Diagnosis not present

## 2021-10-18 DIAGNOSIS — E785 Hyperlipidemia, unspecified: Secondary | ICD-10-CM | POA: Diagnosis not present

## 2021-10-18 DIAGNOSIS — I10 Essential (primary) hypertension: Secondary | ICD-10-CM | POA: Diagnosis not present

## 2021-11-08 ENCOUNTER — Other Ambulatory Visit: Payer: Self-pay | Admitting: Cardiology

## 2021-11-09 ENCOUNTER — Other Ambulatory Visit: Payer: Self-pay | Admitting: Family Medicine

## 2021-11-09 ENCOUNTER — Ambulatory Visit
Admission: RE | Admit: 2021-11-09 | Discharge: 2021-11-09 | Disposition: A | Discharge status: Home / Self Care | Payer: Medicare PPO | Source: Ambulatory Visit | Attending: Family Medicine | Admitting: Family Medicine

## 2021-11-09 DIAGNOSIS — R059 Cough, unspecified: Secondary | ICD-10-CM | POA: Diagnosis not present

## 2021-11-09 DIAGNOSIS — R053 Chronic cough: Secondary | ICD-10-CM

## 2021-11-10 ENCOUNTER — Telehealth: Payer: Self-pay | Admitting: Cardiology

## 2021-11-10 MED ORDER — POTASSIUM CHLORIDE ER 10 MEQ PO TBCR: 20.0000 meq | EXTENDED_RELEASE_TABLET | Freq: Every day | ORAL | 2 refills | Status: DC

## 2021-11-10 NOTE — Telephone Encounter (Signed)
*  STAT* If patient is at the pharmacy, call can be transferred to refill team.   1. Which medications need to be refilled? (please list name of each medication and dose if known)  potassium chloride (KLOR-CON) 10 MEQ tablet  2. Which pharmacy/location (including street and city if local pharmacy) is medication to be sent to? [place], [address]  3. Do they need a 30 day or 90 day supply?  90 day supply

## 2021-11-10 NOTE — Telephone Encounter (Signed)
Refills has been sent to the pharmacy. 

## 2021-11-20 DIAGNOSIS — L932 Other local lupus erythematosus: Secondary | ICD-10-CM | POA: Diagnosis not present

## 2021-11-20 DIAGNOSIS — L300 Nummular dermatitis: Secondary | ICD-10-CM | POA: Diagnosis not present

## 2021-11-20 DIAGNOSIS — L821 Other seborrheic keratosis: Secondary | ICD-10-CM | POA: Diagnosis not present

## 2021-11-20 DIAGNOSIS — L820 Inflamed seborrheic keratosis: Secondary | ICD-10-CM | POA: Diagnosis not present

## 2021-11-24 ENCOUNTER — Encounter: Payer: Medicare PPO | Admitting: Physical Medicine & Rehabilitation

## 2021-12-06 ENCOUNTER — Ambulatory Visit: Payer: Medicare PPO | Admitting: Orthopaedic Surgery

## 2021-12-06 ENCOUNTER — Encounter: Payer: Self-pay | Admitting: Orthopaedic Surgery

## 2021-12-06 DIAGNOSIS — M25562 Pain in left knee: Secondary | ICD-10-CM

## 2021-12-06 DIAGNOSIS — G8929 Other chronic pain: Secondary | ICD-10-CM | POA: Diagnosis not present

## 2021-12-06 DIAGNOSIS — Z79899 Other long term (current) drug therapy: Secondary | ICD-10-CM | POA: Diagnosis not present

## 2021-12-06 DIAGNOSIS — M25561 Pain in right knee: Secondary | ICD-10-CM | POA: Diagnosis not present

## 2021-12-06 DIAGNOSIS — Z6837 Body mass index (BMI) 37.0-37.9, adult: Secondary | ICD-10-CM | POA: Diagnosis not present

## 2021-12-06 DIAGNOSIS — E669 Obesity, unspecified: Secondary | ICD-10-CM | POA: Diagnosis not present

## 2021-12-06 DIAGNOSIS — L932 Other local lupus erythematosus: Secondary | ICD-10-CM | POA: Diagnosis not present

## 2021-12-06 DIAGNOSIS — M1991 Primary osteoarthritis, unspecified site: Secondary | ICD-10-CM | POA: Diagnosis not present

## 2021-12-06 MED ORDER — LIDOCAINE HCL 1 % IJ SOLN
3.0000 mL | INTRAMUSCULAR | Status: AC | PRN
  Administered 2021-12-06: 3 mL

## 2021-12-06 MED ORDER — METHYLPREDNISOLONE ACETATE 40 MG/ML IJ SUSP
40.0000 mg | INTRAMUSCULAR | Status: AC | PRN
  Administered 2021-12-06: 40 mg via INTRA_ARTICULAR

## 2021-12-06 NOTE — Progress Notes (Signed)
The patient is very well-known to me.  She has end-stage arthritis of her left knee and is in need of knee replacement when she gets to the point where it is detrimentally affecting her mobility, her quality of life and her actives daily living.  She said she is getting close.  She is an active 81 year old female.  Her husband has been back now from visiting all 50 states.  She is requesting a steroid injection and aspiration of both knees today.  Both of been hurting her but this still the left knee is always worse in the right knee.  I was able to aspirate about 20 cc of fluid from both knees and placed a steroid injection in both knees which she tolerated well.  She is still thinking about knee replacement surgery but is still not sure she wants to proceed as of yet.  She knows at this point to let us know and that would be my next step is setting her up for a left total knee arthroplasty.  We have discussed this in numerous appointments in the past.  Procedure Note  Patient: Lindsay Clark             Date of Birth: 1940-08-15           MRN: [id number]             Visit Date: 12/06/2021  Procedures: Visit Diagnoses:  1. Chronic pain of right knee   2. Chronic pain of left knee     Large Joint Inj: R knee on 12/06/2021 3:55 PM Indications: diagnostic evaluation and pain Details: 22 G 1.5 in needle, superolateral approach  Arthrogram: No  Medications: 3 mL lidocaine 1 %; 40 mg methylPREDNISolone acetate 40 MG/ML Outcome: tolerated well, no immediate complications Procedure, treatment alternatives, risks and benefits explained, specific risks discussed. Consent was given by the patient. Immediately prior to procedure a time out was called to verify the correct patient, procedure, equipment, support staff and site/side marked as required. Patient was prepped and draped in the usual sterile fashion.    Large Joint Inj: L knee on 12/06/2021 3:55 PM Indications: diagnostic evaluation and  pain Details: 22 G 1.5 in needle, superolateral approach  Arthrogram: No  Medications: 3 mL lidocaine 1 %; 40 mg methylPREDNISolone acetate 40 MG/ML Outcome: tolerated well, no immediate complications Procedure, treatment alternatives, risks and benefits explained, specific risks discussed. Consent was given by the patient. Immediately prior to procedure a time out was called to verify the correct patient, procedure, equipment, support staff and site/side marked as required. Patient was prepped and draped in the usual sterile fashion.

## 2021-12-13 ENCOUNTER — Telehealth: Payer: Self-pay | Admitting: *Deleted

## 2021-12-13 NOTE — Patient Outreach (Signed)
  Care Coordination   Initial Visit Note   12/13/2021 Name: Lindsay Clark MRN: [id number] DOB: [birth year]  Lindsay Clark is a 81 y.o. year old female who sees [name], MD for primary care. I spoke with  Lindsay Clark by phone today  What matters to the patients health and wellness today?  Pt declines Care Coordination.    Goals Addressed   None     SDOH assessments and interventions completed:  Yes  SDOH Interventions Today    Flowsheet Row Most Recent Value  SDOH Interventions   Food Insecurity Interventions Intervention Not Indicated  Housing Interventions Intervention Not Indicated        Care Coordination Interventions Activated:  No  Care Coordination Interventions:  No, not indicated   Follow up plan: No further intervention required.   Encounter Outcome:  Pt. Refused

## 2021-12-14 ENCOUNTER — Other Ambulatory Visit: Payer: Self-pay | Admitting: Cardiology

## 2021-12-15 NOTE — Telephone Encounter (Signed)
Prescription refill request for Eliquis received. Indication:Afib Last office visit:6/23 Scr:0.6 Age: 81 Weight:97.7 kg  Prescription refilled

## 2022-01-11 ENCOUNTER — Encounter: Payer: Self-pay | Admitting: Physical Medicine & Rehabilitation

## 2022-01-11 ENCOUNTER — Encounter: Payer: Medicare PPO | Attending: Physical Medicine & Rehabilitation | Admitting: Physical Medicine & Rehabilitation

## 2022-01-11 VITALS — BP 137/82 | HR 74 | Temp 98.2°F | Ht 64.0 in | Wt 219.8 lb

## 2022-01-11 DIAGNOSIS — G248 Other dystonia: Secondary | ICD-10-CM | POA: Diagnosis not present

## 2022-01-11 MED ORDER — ONABOTULINUMTOXINA 100 UNITS IJ SOLR
100.0000 [IU] | Freq: Once | INTRAMUSCULAR | Status: AC
  Administered 2022-01-11: 100 [IU] via INTRAMUSCULAR

## 2022-01-11 NOTE — Progress Notes (Signed)
Right lower extremity ultrasound injection under ultrasound guidance Indication focal dystonia right lower extremity  Botox dilution 50 units/cc  Linear ultrasound utilized to identify tibialis posterior between fibula and tibia Area marked and prepped with Betadine sterile ultrasound gel utilized  80 mm 22-gauge echo bloc needle was inserted under direct long axis ultrasound visualization.  Tibialis posterior identified, confirmed with electrical stimulation 50 units were injected in 2 sites for a total of 100U  Patient tolerated procedure well Post procedure instructions given

## 2022-01-11 NOTE — Patient Instructions (Signed)

## 2022-01-18 DIAGNOSIS — E559 Vitamin D deficiency, unspecified: Secondary | ICD-10-CM | POA: Diagnosis not present

## 2022-01-22 ENCOUNTER — Ambulatory Visit (INDEPENDENT_AMBULATORY_CARE_PROVIDER_SITE_OTHER): Payer: Medicare PPO

## 2022-01-22 ENCOUNTER — Encounter: Payer: Self-pay | Admitting: Orthopaedic Surgery

## 2022-01-22 ENCOUNTER — Ambulatory Visit: Payer: Self-pay

## 2022-01-22 ENCOUNTER — Ambulatory Visit: Payer: Medicare PPO | Admitting: Orthopaedic Surgery

## 2022-01-22 DIAGNOSIS — M79671 Pain in right foot: Secondary | ICD-10-CM

## 2022-01-22 DIAGNOSIS — M25572 Pain in left ankle and joints of left foot: Secondary | ICD-10-CM | POA: Diagnosis not present

## 2022-01-22 DIAGNOSIS — M79672 Pain in left foot: Secondary | ICD-10-CM

## 2022-01-22 DIAGNOSIS — M25571 Pain in right ankle and joints of right foot: Secondary | ICD-10-CM

## 2022-01-22 NOTE — Progress Notes (Signed)
The patient is very well-known to me.  She is an active 81 year old female with known debilitating arthritis of her left knee with valgus malalignment.  She has been having some issues with both of her feet recently.  She does have remote history of stress fractures of the lateral malleolus of both sides that eventually healed.  If has been sitting for some time she has a lot of pain in the bottom of her feet on both sides.  She says she has "fallen arches".  On exam she does have pain over the plantar fascia of both sides.  She does have flatfeet as well.  Her Achilles is intact bilaterally and her feet are well-perfused.  Both ankles are ligamentously stable with good range of motion.  X-rays of both calcaneus and both ankles show no acute injuries.  There is flattening of the arch on both sides.  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  I talked her about stretching exercises and more firm shoe wear as well as supportive inserts.  All question concerns were answered and addressed.  Follow-up for now is as needed.

## 2022-01-25 DIAGNOSIS — L821 Other seborrheic keratosis: Secondary | ICD-10-CM | POA: Diagnosis not present

## 2022-01-25 DIAGNOSIS — L820 Inflamed seborrheic keratosis: Secondary | ICD-10-CM | POA: Diagnosis not present

## 2022-01-25 DIAGNOSIS — L300 Nummular dermatitis: Secondary | ICD-10-CM | POA: Diagnosis not present

## 2022-01-25 DIAGNOSIS — L720 Epidermal cyst: Secondary | ICD-10-CM | POA: Diagnosis not present

## 2022-02-07 DIAGNOSIS — H2512 Age-related nuclear cataract, left eye: Secondary | ICD-10-CM | POA: Diagnosis not present

## 2022-02-07 DIAGNOSIS — H35371 Puckering of macula, right eye: Secondary | ICD-10-CM | POA: Diagnosis not present

## 2022-02-08 IMAGING — CT CT ABD-PELV W/ CM
2 of 5 series · 11 of 46 positions shown, 12 images · IV contrast (iopamidol)
Comparison: Coronary CTA 06/16/2019

CLINICAL DATA: 79-year-old with left groin pain.

EXAM:
CT ABDOMEN AND PELVIS WITH CONTRAST
TECHNIQUE: Multidetector CT imaging of the abdomen and pelvis was performed
using the standard protocol following bolus administration of
intravenous contrast.
CONTRAST:  100mL [id number]-BZZ IOPAMIDOL (3O72SW-BZZ) INJECTION 61%

[Series 2: abd pelvis 5.00 br40 s3 axial · axial · 0.66mm/px · z∈[+1290,+1645]mm · 8 of 93 slices shown, 9 images]
[im 11/93  soft-tissue]
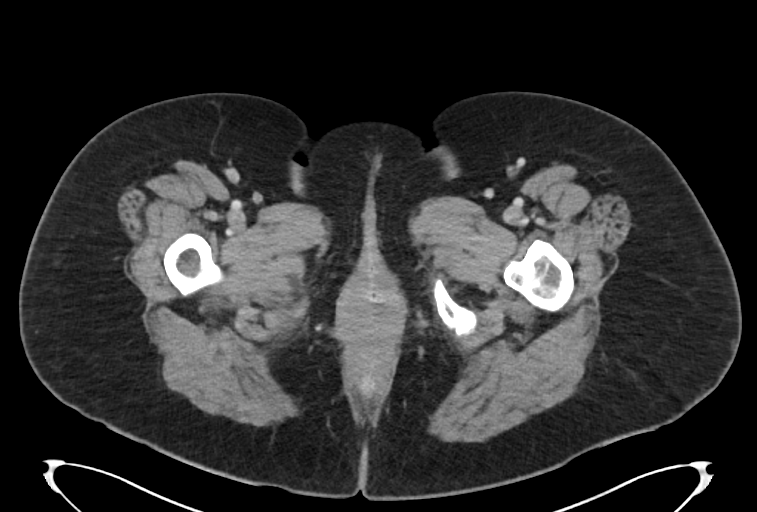
[im 11/93  bone]
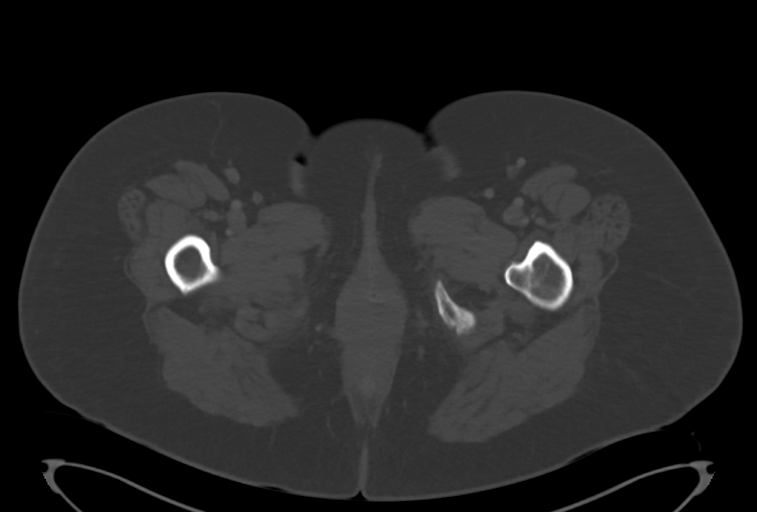
[im 21/93  soft-tissue]
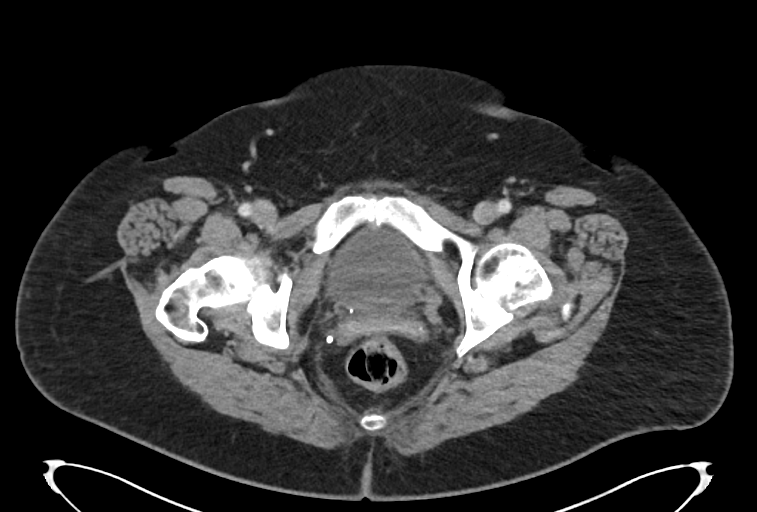
[im 31/93  soft-tissue]
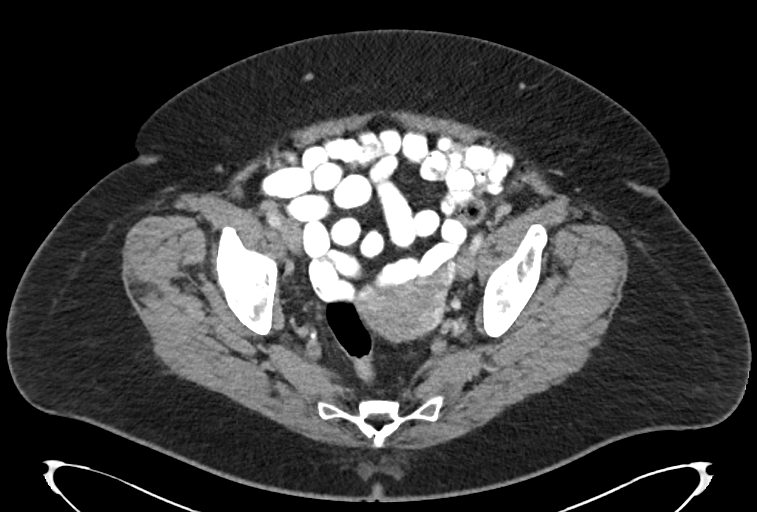
[im 41/93  soft-tissue]
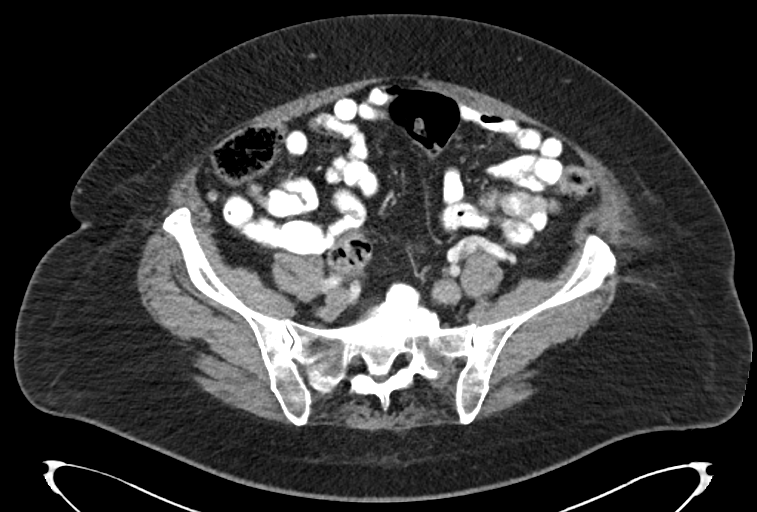
[im 52/93  soft-tissue]
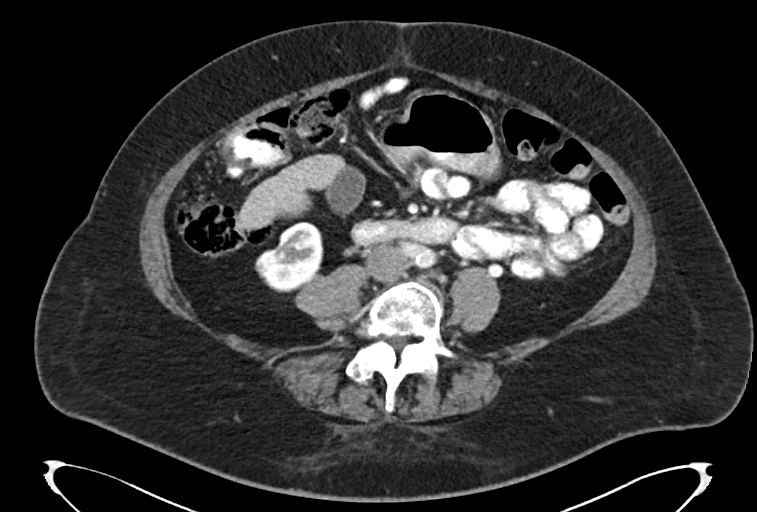
[im 62/93  soft-tissue]
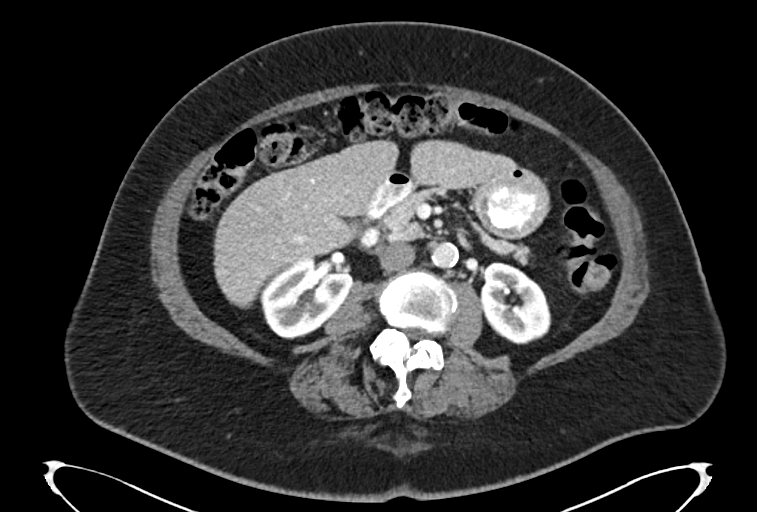
[im 72/93  soft-tissue]
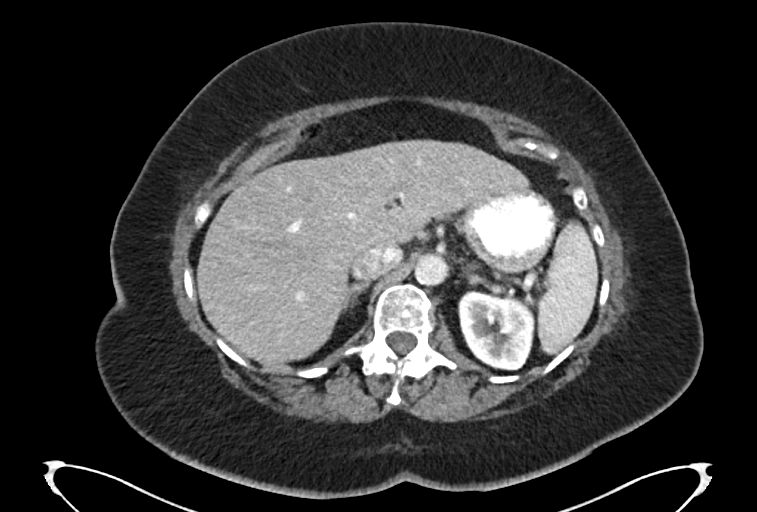
[im 82/93  soft-tissue]
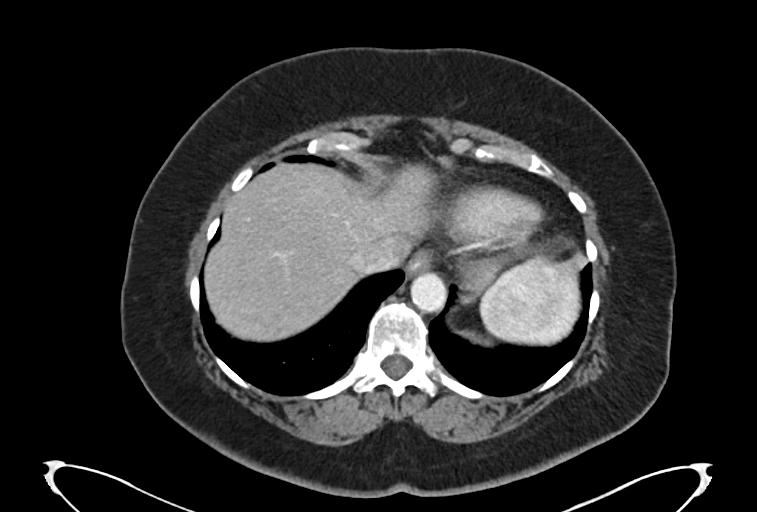

[Series 6: abd pelvis 2.00 br40 s3 cor · coronal · 0.91mm/px · 3 of 163 slices shown]
[im 55/163  soft-tissue]
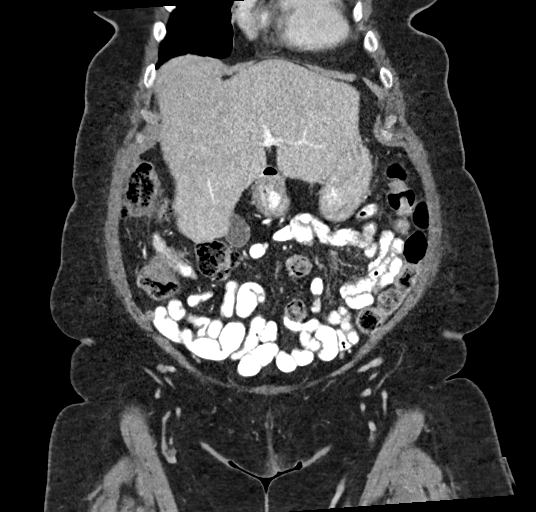
[im 73/163  soft-tissue]
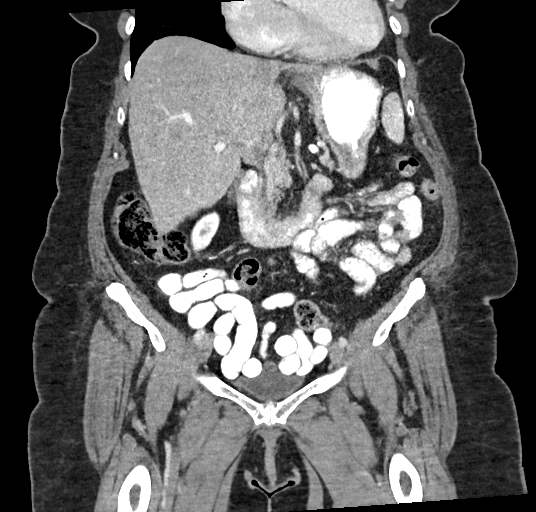
[im 91/163  soft-tissue]
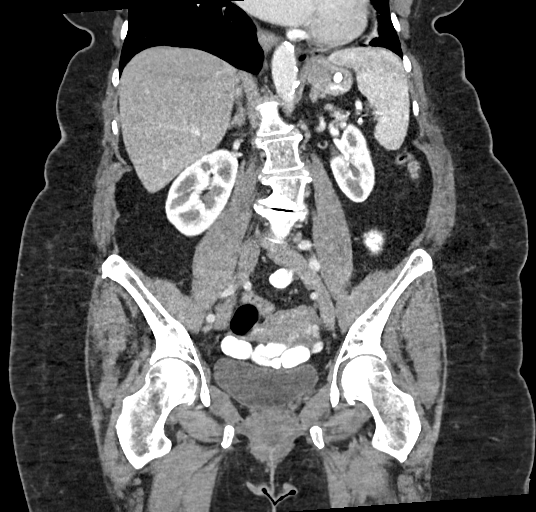

[11 of 46 positions shown; findings below may reference images not displayed]

FINDINGS: Lower chest: Lung bases are clear.  No pleural effusions.

Hepatobiliary: Normal appearance of the liver, gallbladder and
portal venous system. No biliary dilatation.

Pancreas: Unremarkable. No pancreatic ductal dilatation or
surrounding inflammatory changes.

Spleen: Normal in size without focal abnormality.

Adrenals/Urinary Tract: Normal adrenal glands. No hydronephrosis.
Focal scarring along the right kidney lower pole. No suspicious
renal lesions. Normal appearance of the urinary bladder.

Stomach/Bowel: Stomach is within normal limits. Appendix appears
normal. No evidence of bowel wall thickening, distention, or
inflammatory changes.

Vascular/Lymphatic: Atherosclerotic calcifications involving the
abdominal aorta without aneurysm. No significant lymph node
enlargement in the abdomen or pelvis. Prominent left ovarian vein
with enlarged venous structures along the left side of the uterus
and left adnexa.

Reproductive: Uterus and bilateral adnexa are unremarkable.

Other: No evidence for an inguinal or femoral hernia. Small
umbilical hernia containing fat. No ascites.

Musculoskeletal: Severe disc space narrowing at L3-L4, L4-L5 and
L5-S1. Extensive bilateral facet disease in lumbar spine.
IMPRESSION: 1. Enlarged left ovarian vein and enlarged venous structures along
the left adnexa and left side of the uterus. Based on the history of
left groin pain, it is possible that the patient's symptoms are
related to pelvic congestion syndrome. No other imaging findings to
explain left groin pain.
2. No acute abnormality in the abdomen or pelvis.
3.  Aortic Atherosclerosis ([id number]-KF5.5).
4. Significant degenerative changes in lumbar spine.

## 2022-02-21 DIAGNOSIS — Z23 Encounter for immunization: Secondary | ICD-10-CM | POA: Diagnosis not present

## 2022-03-01 DIAGNOSIS — H35371 Puckering of macula, right eye: Secondary | ICD-10-CM | POA: Diagnosis not present

## 2022-03-01 DIAGNOSIS — H43813 Vitreous degeneration, bilateral: Secondary | ICD-10-CM | POA: Diagnosis not present

## 2022-03-01 DIAGNOSIS — H3540 Unspecified peripheral retinal degeneration: Secondary | ICD-10-CM | POA: Diagnosis not present

## 2022-03-01 DIAGNOSIS — H353131 Nonexudative age-related macular degeneration, bilateral, early dry stage: Secondary | ICD-10-CM | POA: Diagnosis not present

## 2022-03-07 DIAGNOSIS — G629 Polyneuropathy, unspecified: Secondary | ICD-10-CM | POA: Diagnosis not present

## 2022-03-07 DIAGNOSIS — G249 Dystonia, unspecified: Secondary | ICD-10-CM | POA: Diagnosis not present

## 2022-03-08 ENCOUNTER — Encounter: Payer: Self-pay | Admitting: Orthopaedic Surgery

## 2022-03-08 ENCOUNTER — Ambulatory Visit: Payer: Medicare PPO | Admitting: Orthopaedic Surgery

## 2022-03-08 DIAGNOSIS — M25561 Pain in right knee: Secondary | ICD-10-CM

## 2022-03-08 DIAGNOSIS — M170 Bilateral primary osteoarthritis of knee: Secondary | ICD-10-CM | POA: Diagnosis not present

## 2022-03-08 DIAGNOSIS — G8929 Other chronic pain: Secondary | ICD-10-CM

## 2022-03-08 DIAGNOSIS — M25562 Pain in left knee: Secondary | ICD-10-CM | POA: Diagnosis not present

## 2022-03-08 DIAGNOSIS — M1711 Unilateral primary osteoarthritis, right knee: Secondary | ICD-10-CM | POA: Diagnosis not present

## 2022-03-08 DIAGNOSIS — M1712 Unilateral primary osteoarthritis, left knee: Secondary | ICD-10-CM | POA: Diagnosis not present

## 2022-03-08 MED ORDER — METHYLPREDNISOLONE ACETATE 40 MG/ML IJ SUSP
40.0000 mg | INTRAMUSCULAR | Status: AC | PRN
  Administered 2022-03-08: 40 mg via INTRA_ARTICULAR

## 2022-03-08 MED ORDER — LIDOCAINE HCL 1 % IJ SOLN
3.0000 mL | INTRAMUSCULAR | Status: AC | PRN
  Administered 2022-03-08: 3 mL

## 2022-03-08 NOTE — Progress Notes (Signed)
The patient is very well-known to me.  She is a 81 year old female who is quite active.  She has well-documented well-known debilitating arthritis in both her knees with the left much worse than the right.  She has tried failed conservative treatment for many years now including even arthroscopic intervention.  She has had multiple steroid injections and gel injections in her knees.  At this point her left knee pain is detrimentally affecting her mobility, her quality of life and actives daily living.  She is considering knee replacement surgery at the end of January or early February of next year.  She does wish to have steroid injections in both knees today.  She has done a good job of maintaining her health overall.  She does use assistive devices when she can for ambulating but she tries to avoid these.  Pain is not better with rest but it certainly there with weightbearing activities.  Her x-rays in the past show valgus malalignment of the left knee and bone-on-bone wear of all 3 compartments of both knees with osteophytes in all 3 compartments.  She is currently denies any chest pain, shortness of breath, fever, chills, nausea, vomiting.  I was able to review all of her notes within epic and her medications.  On examination of her left knee does have mild effusion.  She has valgus malalignment that knee and significant global tenderness in all 3 compartments and significant patellofemoral crepitation.  There is decent range of motion of the knee.  I did place a sterile injection of both knees today without difficulty.  We went over in length in detail knee replacement surgery again and described in detail what the surgery involves.  We talked about the risk and benefits of surgery.  We talked about what to expect from an intraoperative and postoperative course.  All questions and concerns were answered and addressed.  We will work on getting her scheduled for late January/early February 2024.  This will be  for her left knee replacement.      Procedure Note  Patient: Lindsay Clark             Date of Birth: [birth year]           MRN: [id number]             Visit Date: 03/08/2022  Procedures: Visit Diagnoses:  1. Chronic pain of right knee   2. Chronic pain of left knee   3. Unilateral primary osteoarthritis, left knee   4. Unilateral primary osteoarthritis, right knee     Large Joint Inj: R knee on 03/08/2022 3:34 PM Indications: diagnostic evaluation and pain Details: 22 G 1.5 in needle, superolateral approach  Arthrogram: No  Medications: 3 mL lidocaine 1 %; 40 mg methylPREDNISolone acetate 40 MG/ML Outcome: tolerated well, no immediate complications Procedure, treatment alternatives, risks and benefits explained, specific risks discussed. Consent was given by the patient. Immediately prior to procedure a time out was called to verify the correct patient, procedure, equipment, support staff and site/side marked as required. Patient was prepped and draped in the usual sterile fashion.    Large Joint Inj: L knee on 03/08/2022 3:34 PM Indications: diagnostic evaluation and pain Details: 22 G 1.5 in needle, superolateral approach  Arthrogram: No  Medications: 3 mL lidocaine 1 %; 40 mg methylPREDNISolone acetate 40 MG/ML Outcome: tolerated well, no immediate complications Procedure, treatment alternatives, risks and benefits explained, specific risks discussed. Consent was given by the patient. Immediately prior to procedure a  time out was called to verify the correct patient, procedure, equipment, support staff and site/side marked as required. Patient was prepped and draped in the usual sterile fashion.

## 2022-03-20 ENCOUNTER — Telehealth: Payer: Self-pay

## 2022-03-20 NOTE — Telephone Encounter (Signed)
   Pre-operative Risk Assessment    Patient Name: Lindsay Clark  DOB: 12-Feb-1941 MRN: [id number]      Request for Surgical Clearance    Procedure:  Lett total knee arthoplasty   Date of Surgery:  Clearance 04/24/2022                             {  Surgeon:  Jean Rosenthal, MD  Surgeon's Group or Practice Name:  [facility]  Phone number:  [phone] Fax number:     Type of Clearance Requested:   - Pharmacy:  Hold Apixaban (Eliquis)   3 DAYS BEFORE    Type of Anesthesia:  Not Indicated   Additional requests/questions:    Oneal Grout   03/20/2022, 2:38 PM

## 2022-03-21 NOTE — Telephone Encounter (Signed)
Patient on Eliquis for paroxysmal atrial fibrillation. Will route to pharmacy team for input.    CHA2DS2-VASc Score = 5   This indicates a 7.2% annual risk of stroke. The patient's score is based upon: CHF History: 0 HTN History: 1 Diabetes History: 0 Stroke History: 0 Vascular Disease History: 1 Age Score: 2 Gender Score: 1     Labs via [facility] 12/06/21: creatinine 0.64, Hb 13.2, Hct 39.6, Plt 196  Creatinine clearance: 82 mL/min (adjusted for weight)  Loel Dubonnet, NP  03/21/22  8:47 AM

## 2022-03-22 NOTE — Telephone Encounter (Signed)
   Name: Lindsay Clark  DOB: April 12, 1941  MRN: [id number]  Primary Cardiologist: Kirk Ruths, MD   Preoperative team, please contact this patient and set up a phone call appointment for further preoperative risk assessment. Please obtain consent and complete medication review. Thank you for your help.  I confirm that guidance regarding antiplatelet and oral anticoagulation therapy has been completed and, if necessary, noted below.  Pharmacy has addressed request for anticoagulation hold.   Deberah Pelton, NP 03/22/2022, 10:25 AM [place]

## 2022-03-22 NOTE — Telephone Encounter (Signed)
Patient with diagnosis of afib on Eliquis for anticoagulation.    Procedure: left TKA Date of procedure: 04/24/22  CHA2DS2-VASc Score = 5  This indicates a 7.2% annual risk of stroke. The patient's score is based upon: CHF History: 0 HTN History: 1 Diabetes History: 0 Stroke History: 0 Vascular Disease History: 1 Age Score: 2 Gender Score: 1   CrCl 56m/min using adjusted body weight due to obesity Platelet count 185K  Per office protocol, patient can hold Eliquis for 3 days prior to procedure.    **This guidance is not considered finalized until pre-operative APP has relayed final recommendations.**

## 2022-03-22 NOTE — Telephone Encounter (Signed)
Pt has upcoming appt on 12/18 with Dr. [name] surgical clearance will be addressed at ov.

## 2022-03-28 ENCOUNTER — Other Ambulatory Visit: Payer: Self-pay

## 2022-03-29 NOTE — Progress Notes (Signed)
HPI: FU PAF. 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 Patient seen in September 2017 with complaints of palpitations and near syncope. Patient was scheduled for an exercise treadmill and when she came she was noted to have runs of paroxysmal atrial tachycardia/fibrillation. Procedure was canceled. Monitor October 2018 showed paroxysmal atririllation and flutter. Nuclear study October 2018 showed ejection fraction 67%, apical thinning but no ischemia. Placed on apixaban.  Had implantable loop recorder placed by Dr. Rayann Heman for syncope and recurrent palpitations. She has been found to have "heart pounding" episodes not related to arrhythmia. Echo repeated 2/21 showed normal LV function, grade 1 DD, mild to moderate LAE. Coronary CTA 2/21 showed Ca score 36 and minimal (0-24%) in proximal LM, proximal and mid LAD and proximal Lcx.  Patient had atrial fibrillation ablation at [facility] May 2022.  Since last seen she has some dyspnea on exertion unchanged.  She has chronic lower extremity edema that is controlled with Lasix.  She denies chest pain, palpitations or syncope.  Possible recurrent atrial fibrillation on her Apple Watch.  Current Outpatient Medications  Medication Sig Dispense Refill   cholecalciferol (VITAMIN D3) 25 MCG (1000 UNIT) tablet Take 6,000 Units by mouth daily.     ELIQUIS 5 MG TABS tablet TAKE ONE TABLET BY MOUTH TWICE DAILY 60 tablet 5   furosemide (LASIX) 40 MG tablet TAKE TWO TABLETS BY MOUTH DAILY. 180 tablet 3   hydroxychloroquine (PLAQUENIL) 200 MG tablet Take 200 mg by mouth 2 (two) times daily.     metoprolol succinate (TOPROL-XL) 25 MG 24 hr tablet Take 1 tablet (25 mg total) by mouth 2 (two) times daily. 60 tablet 5   Multiple Vitamin (MULTIVITAMIN WITH MINERALS) TABS tablet Take 1 tablet by mouth in the morning. One-A-Day for Women 50+     potassium chloride (KLOR-CON) 10 MEQ tablet Take 2 tablets (20 mEq total) by mouth daily with breakfast. 180  tablet 2   Propylene Glycol 0.6 % SOLN Place 1 drop into both eyes at bedtime.     hydrOXYzine (ATARAX/VISTARIL) 10 MG tablet Take 1 tablet by mouth daily. (Patient not taking: Reported on 04/09/2022)     No current facility-administered medications for this visit.     Past Medical History:  Diagnosis Date   Atherosclerosis    mild cerebral   Atrial fibrillation and flutter (HCC)    Autoimmune disorder (HCC)    Autoimmune encephalitis    Cervical disc disease    with spondylosis   Cervical spondylosis    Chronic dermatitis    eczematous   Colitis    COVID 10/2020   DDD (degenerative disc disease), lumbar    Frozen shoulder    left   Hyperlipidemia    Hypertension    IBS (irritable bowel syndrome)    with diarrhea   Lateral epicondylitis    Lumbar spondylosis    Lupus (HCC)    Movement disorder    Multiple lesions on CT of brain and spine    reports lesions from previous CT scan [facility]    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   PONV (postoperative nausea and vomiting)    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   Torn meniscus    left    Past Surgical History:  Procedure Laterality Date   A FLUTTER ABLATION  08/2020   [facility]   CATARACT EXTRACTION     COLONOSCOPY     EYE SURGERY Left  HEMORRHOID BANDING     KNEE ARTHROSCOPY Left 08/09/2020   Procedure: LEFT KNEE ARTHROSCOPY MEDIAL AND LATERAL MENISECTONMY AND CONDROPLASTY;  Surgeon: Melrose Nakayama, MD;  Location: [facility];  Service: Orthopedics;  Laterality: Left;   LOOP RECORDER INSERTION N/A 07/05/2017   Procedure: LOOP RECORDER INSERTION;  Surgeon: Thompson Grayer, MD;  Location: [place] [facility];  Service: Cardiovascular;  Laterality: N/A;   TUBAL LIGATION  1980   WISDOM TOOTH EXTRACTION      Social History   Socioeconomic History   Marital status: Married    Spouse name: [name]   Number of children: 2   Years of education: MAx2   Highest education level: Not on file  Occupational History    Occupation: Retired    Fish farm manager: OTHER  Tobacco Use   Smoking status: Never   Smokeless tobacco: Never  Vaping Use   Vaping Use: Never used  Substance and Sexual Activity   Alcohol use: Yes    Alcohol/week: 4.0 standard drinks of alcohol    Types: 4 Glasses of wine per week    Comment: 4 glasses of wine weekly   Drug use: No   Sexual activity: Not Currently  Other Topics Concern   Not on file  Social History Narrative   Patient lives at home with her spouse.   Caffeine Use: 2-3 cups daily   Social Determinants of Health   Financial Resource Strain: Not on file  Food Insecurity: No Food Insecurity (12/13/2021)   Hunger Vital Sign    Worried About Running Out of Food in the Last Year: Never true    Ran Out of Food in the Last Year: Never true  Transportation Needs: Not on file  Physical Activity: Not on file  Stress: Not on file  Social Connections: Not on file  Intimate Partner Violence: Not on file    Family History  Problem Relation Age of Onset   Cancer Mother 28       colon cancer   Stroke Mother    Colon cancer Mother 8   Heart attack Father 71   Emphysema Father    Cancer Maternal Grandmother 64       uterine   Thyroid disease Maternal Grandmother    Thrombosis Maternal Grandfather    Thyroid disease Son     ROS: Knee arthralgias but no fevers or chills, productive cough, hemoptysis, dysphasia, odynophagia, melena, hematochezia, dysuria, hematuria, rash, seizure activity, orthopnea, PND, pedal edema, claudication. Remaining systems are negative.  Physical Exam: Well-developed well-nourished in no acute distress.  Skin is warm and dry.  HEENT is normal.  Neck is supple.  Chest is clear to auscultation with normal expansion.  Cardiovascular exam is regular rate and rhythm.  Abdominal exam nontender or distended. No masses palpated. Extremities show no edema. neuro grossly intact  ECG-normal sinus rhythm at a rate of 76, no ST changes.  Personally  reviewed  A/P  1 paroxysmal atrial fibrillation-patient is status post ablation in May 2022.  She previously asked to have her amiodarone discontinued.  She remains in sinus rhythm.  Continue Toprol for rate control if atrial fibrillation recurs.  Note she would like to decrease this to 25 mg daily and she will do this but after her upcoming knee surgery.  Continue apixaban.  2 coronary artery disease-patient denies chest pain.  She has previously declined all lipid-lowering medications.  She is not on aspirin given need for apixaban.  3 hypertension-blood pressure is controlled.  Continue present medications  and follow.  4 lower extremity edema-reasonly well-controlled.  Continue diuretic at present dose.  5 previous history of syncope-patient has had no recurrences.  6 preoperative evaluation prior to knee replacement-she denies chest pain.  Previous CTA did not show obstructive coronary disease.  She may proceed without further cardiac evaluation.  Discontinue apixaban 3 days prior to procedure and resume after when hemostasis achieved.  Kirk Ruths, MD

## 2022-04-09 ENCOUNTER — Encounter: Payer: Self-pay | Admitting: Cardiology

## 2022-04-09 ENCOUNTER — Ambulatory Visit: Payer: Medicare PPO | Attending: Cardiology | Admitting: Cardiology

## 2022-04-09 ENCOUNTER — Encounter: Payer: Self-pay | Admitting: Orthopaedic Surgery

## 2022-04-09 ENCOUNTER — Telehealth: Payer: Self-pay | Admitting: Orthopaedic Surgery

## 2022-04-09 VITALS — BP 109/73 | HR 76 | Ht 64.0 in | Wt 216.4 lb

## 2022-04-09 DIAGNOSIS — I2510 Atherosclerotic heart disease of native coronary artery without angina pectoris: Secondary | ICD-10-CM | POA: Diagnosis not present

## 2022-04-09 DIAGNOSIS — I480 Paroxysmal atrial fibrillation: Secondary | ICD-10-CM

## 2022-04-09 DIAGNOSIS — I2584 Coronary atherosclerosis due to calcified coronary lesion: Secondary | ICD-10-CM

## 2022-04-09 DIAGNOSIS — R609 Edema, unspecified: Secondary | ICD-10-CM | POA: Diagnosis not present

## 2022-04-09 DIAGNOSIS — I10 Essential (primary) hypertension: Secondary | ICD-10-CM | POA: Diagnosis not present

## 2022-04-09 MED ORDER — METOPROLOL SUCCINATE ER 25 MG PO TB24: 25.0000 mg | ORAL_TABLET | Freq: Every day | ORAL | 3 refills | Status: DC

## 2022-04-09 MED ORDER — METOPROLOL SUCCINATE ER 25 MG PO TB24: 25.0000 mg | ORAL_TABLET | Freq: Every day | ORAL | 5 refills | Status: DC

## 2022-04-09 NOTE — Patient Instructions (Signed)
Medication Instructions:   HOLD ELIQUIS 3 DAYS PRIOR TO SURGERY  AFTER SURGERY -DECREASE METOPROLOL TO 25 MG ONCE DAILY  *If you need a refill on your cardiac medications before your next appointment, please call your pharmacy*   Follow-Up: At [facility], you and your health needs are our priority.  As part of our continuing mission to provide you with exceptional heart care, we have created designated Provider Care Teams.  These Care Teams include your primary Cardiologist (physician) and Advanced Practice Providers (APPs -  Physician Assistants and Nurse Practitioners) who all work together to provide you with the care you need, when you need it.  We recommend signing up for the patient portal called "MyChart".  Sign up information is provided on this After Visit Summary.  MyChart is used to connect with patients for Virtual Visits (Telemedicine).  Patients are able to view lab/test results, encounter notes, upcoming appointments, etc.  Non-urgent messages can be sent to your provider as well.   To learn more about what you can do with MyChart, go to [email or web address].    Your next appointment:   6 month(s)  The format for your next appointment:   In Person  Provider:   Kirk Ruths, MD

## 2022-04-09 NOTE — Telephone Encounter (Signed)
Patient states that she is having trouble with her other Knee and would like to be called before her pre op appointment.

## 2022-04-09 NOTE — Addendum Note (Signed)
Addended by: Cristopher Estimable on: 04/09/2022 11:03 AM   Modules accepted: Orders

## 2022-04-10 NOTE — Telephone Encounter (Signed)
CALLED PATIENT LEFT MESSAGE WILL TRY TO CONTACT AGAIN TOMORROW

## 2022-04-11 NOTE — Progress Notes (Signed)
Surgical Instructions    Your procedure is scheduled on Tuesday January 2.  Report to [facility] Main Entrance "A" at 12:16 pm., then check in with the Admitting office.  Call this number if you have problems the morning of surgery:  [phone]   If you have any questions prior to your surgery date call [phone]: Open Monday-Friday 8am-4pm If you experience any cold or flu symptoms such as cough, fever, chills, shortness of breath, etc. between now and your scheduled surgery, please notify us at the above number     Remember:  Do not eat after midnight the night before your surgery  You may drink clear liquids until 11:16am the morning of your surgery.   Clear liquids allowed are: Water, Non-Citrus Juices (without pulp), Carbonated Beverages, Clear Tea, Black Coffee ONLY (NO MILK, CREAM OR POWDERED CREAMER of any kind), and Gatorade    Take these medicines the morning of surgery with A SIP OF WATER:  metoprolol succinate (TOPROL-XL)   FOLLOW YOUR PRESCRIBING PHYSICIAN'S INSTRUCTIONS REGARDING WHEN TO STOP ELIQUIS PRIOR TO SURGERY.  IF NO INSTRUCTIONS WERE GIVEN,PLEASE CALL THAT PHYSICIAN'S OFFICE IMMEDIATELY.   As of today, STOP taking any Aspirin (unless otherwise instructed by your surgeon) Aleve, Naproxen, Ibuprofen, Motrin, Advil, Goody's, BC's, all herbal medications, fish oil, and all vitamins.           Do not wear jewelry or makeup. Do not wear lotions, powders, perfumes/cologne or deodorant. Do not shave 48 hours prior to surgery.  Men may shave face and neck. Do not bring valuables to the hospital. Do not wear nail polish, gel polish, artificial nails, or any other type of covering on natural nails (fingers and toes) If you have artificial nails or gel coating that need to be removed by a nail salon, please have this removed prior to surgery. Artificial nails or gel coating may interfere with anesthesia's ability to adequately monitor your vital signs.  [facility] is  not responsible for any belongings or valuables.    Do NOT Smoke (Tobacco/Vaping)  24 hours prior to your procedure  If you use a CPAP at night, you may bring your mask for your overnight stay.   Contacts, glasses, hearing aids, dentures or partials may not be worn into surgery, please bring cases for these belongings   For patients admitted to the hospital, discharge time will be determined by your treatment team.   Patients discharged the day of surgery will not be allowed to drive home, and someone needs to stay with them for 24 hours.   SURGICAL WAITING ROOM VISITATION Patients having surgery or a procedure may have no more than 2 support people in the waiting area - these visitors may rotate.   Children under the age of 55 must have an adult with them who is not the patient. If the patient needs to stay at the hospital during part of their recovery, the visitor guidelines for inpatient rooms apply. Pre-op nurse will coordinate an appropriate time for 1 support person to accompany patient in pre-op.  This support person may not rotate.   Please refer to [email or web address] for the visitor guidelines for Inpatients (after your surgery is over and you are in a regular room).    Special instructions:    Oral Hygiene is also important to reduce your risk of infection.  Remember - BRUSH YOUR TEETH THE MORNING OF SURGERY WITH YOUR REGULAR TOOTHPASTE   [place]- Preparing For Surgery  Before surgery, you can play  an important role. Because skin is not sterile, your skin needs to be as free of germs as possible. You can reduce the number of germs on your skin by washing with CHG (chlorahexidine gluconate) Soap before surgery.  CHG is an antiseptic cleaner which kills germs and bonds with the skin to continue killing germs even after washing.     Please do not use if you have an allergy to CHG or antibacterial soaps. If your skin becomes  reddened/irritated stop using the CHG.  Do not shave (including legs and underarms) for at least 48 hours prior to first CHG shower. It is OK to shave your face.  Please follow these instructions carefully.     Shower the NIGHT BEFORE SURGERY and the MORNING OF SURGERY with CHG Soap.   If you chose to wash your hair, wash your hair first as usual with your normal shampoo. After you shampoo, rinse your hair and body thoroughly to remove the shampoo.  Then [employer] and genitals (private parts) with your normal soap and rinse thoroughly to remove soap.  After that Use CHG Soap as you would any other liquid soap. You can apply CHG directly to the skin and wash gently with a scrungie or a clean washcloth.   Apply the CHG Soap to your body ONLY FROM THE NECK DOWN.  Do not use on open wounds or open sores. Avoid contact with your eyes, ears, mouth and genitals (private parts). Wash Face and genitals (private parts)  with your normal soap.   Wash thoroughly, paying special attention to the area where your surgery will be performed.  Thoroughly rinse your body with warm water from the neck down.  DO NOT shower/wash with your normal soap after using and rinsing off the CHG Soap.  Pat yourself dry with a CLEAN TOWEL.  Wear CLEAN PAJAMAS to bed the night before surgery  Place CLEAN SHEETS on your bed the night before your surgery  DO NOT SLEEP WITH PETS.   Day of Surgery:  Take a shower with CHG soap. Wear Clean/Comfortable clothing the morning of surgery Do not apply any deodorants/lotions.   Remember to brush your teeth WITH YOUR REGULAR TOOTHPASTE.    If you received a COVID test during your pre-op visit, it is requested that you wear a mask when out in public, stay away from anyone that may not be feeling well, and notify your surgeon if you develop symptoms. If you have been in contact with anyone that has tested positive in the last 10 days, please notify your surgeon.    Please  read over the following fact sheets that you were given.

## 2022-04-12 ENCOUNTER — Telehealth: Payer: Self-pay | Admitting: Physician Assistant

## 2022-04-12 ENCOUNTER — Encounter (HOSPITAL_COMMUNITY)
Admission: RE | Admit: 2022-04-12 | Discharge: 2022-04-12 | Disposition: A | Discharge status: Home / Self Care | Payer: Medicare PPO | Source: Ambulatory Visit | Attending: Orthopaedic Surgery | Admitting: Orthopaedic Surgery

## 2022-04-12 ENCOUNTER — Encounter (HOSPITAL_COMMUNITY): Payer: Self-pay

## 2022-04-12 ENCOUNTER — Other Ambulatory Visit: Payer: Self-pay

## 2022-04-12 VITALS — BP 140/80 | HR 75 | Temp 97.3°F | Resp 18 | Ht 64.0 in | Wt 214.8 lb

## 2022-04-12 DIAGNOSIS — Z01812 Encounter for preprocedural laboratory examination: Secondary | ICD-10-CM | POA: Insufficient documentation

## 2022-04-12 DIAGNOSIS — E785 Hyperlipidemia, unspecified: Secondary | ICD-10-CM

## 2022-04-12 DIAGNOSIS — Z01818 Encounter for other preprocedural examination: Secondary | ICD-10-CM

## 2022-04-12 DIAGNOSIS — I10 Essential (primary) hypertension: Secondary | ICD-10-CM | POA: Diagnosis not present

## 2022-04-12 LAB — SURGICAL PCR SCREEN
MRSA, PCR: NEGATIVE
Staphylococcus aureus: NEGATIVE

## 2022-04-12 LAB — BASIC METABOLIC PANEL
Anion gap: 10 (ref 5–15)
BUN: 17 mg/dL (ref 8–23)
CO2: 28 mmol/L (ref 22–32)
Calcium: 8.6 mg/dL — ABNORMAL LOW (ref 8.9–10.3)
Chloride: 102 mmol/L (ref 98–111)
Creatinine, Ser: 0.55 mg/dL (ref 0.44–1.00)
GFR, Estimated: >60 mL/min (ref >60)
Glucose, Bld: 104 mg/dL — ABNORMAL HIGH (ref 70–99)
Potassium: 3.3 mmol/L — ABNORMAL LOW (ref 3.5–5.1)
Sodium: 140 mmol/L (ref 135–145)

## 2022-04-12 LAB — CBC
HCT: 41.3 % (ref 36.0–46.0)
Hemoglobin: 13.4 g/dL (ref 12.0–15.0)
MCH: 30.5 pg (ref 26.0–34.0)
MCHC: 32.4 g/dL (ref 30.0–36.0)
MCV: 93.9 fL (ref 80.0–100.0)
Platelets: 198 10*3/uL (ref 150–400)
RBC: 4.4 MIL/uL (ref 3.87–5.11)
RDW: 13.4 % (ref 11.5–15.5)
WBC: 5.9 10*3/uL (ref 4.0–10.5)
nRBC: 0 % (ref 0.0–0.2)

## 2022-04-12 NOTE — Anesthesia Preprocedure Evaluation (Addendum)
Anesthesia Evaluation  Patient identified by MRN, date of birth, ID band Patient awake    Reviewed: Allergy & Precautions, NPO status , Patient's Chart, lab work & pertinent test results  Airway Mallampati: II  TM Distance: >3 FB Neck ROM: Full    Dental  (+) Teeth Intact, Dental Advisory Given   Pulmonary    breath sounds clear to auscultation       Cardiovascular hypertension, Pt. on home beta blockers + CAD  + dysrhythmias Atrial Fibrillation  Rhythm:Regular     Neuro/Psych   Anxiety     negative neurological ROS     GI/Hepatic negative GI ROS, Neg liver ROS,,,  Endo/Other  negative endocrine ROS    Renal/GU negative Renal ROS     Musculoskeletal  (+) Arthritis ,    Abdominal   Peds  Hematology negative hematology ROS (+)   Anesthesia Other Findings   Reproductive/Obstetrics                             Anesthesia Physical Anesthesia Plan  ASA: 3  Anesthesia Plan: Spinal   Post-op Pain Management: Regional block*   Induction: Intravenous  PONV Risk Score and Plan: 1 and Ondansetron, Propofol infusion and Treatment may vary due to age or medical condition  Airway Management Planned: Natural Airway and Simple Face Mask  Additional Equipment: None  Intra-op Plan:   Post-operative Plan:   Informed Consent: I have reviewed the patients History and Physical, chart, labs and discussed the procedure including the risks, benefits and alternatives for the proposed anesthesia with the patient or authorized representative who has indicated his/her understanding and acceptance.       Plan Discussed with: CRNA  Anesthesia Plan Comments: (PAT note by Karoline Caldwell, PA-C:  Follows with cardiology for hx of afib s/p ablation 08/2020, on eliquis. Monitor October 2018 showed paroxysmal atririllation and flutter. Nuclear study October 2018 showed ejection fraction 67%, apical thinning but  no ischemia.Placed on apixaban.Had implantable loop recorder placed by Dr. Rayann Heman for syncope and recurrent palpitations. Echo repeated 2/21 showed normal LV function, grade 1 DD, mild to moderate LAE. Coronary CTA 2/21 showed Ca score 36 and minimal (0-24%) in proximal LM, proximal and mid LAD and proximal Lcx.Patient had atrial fibrillation ablation at [facility] May 2022. Seen by Dr. [name] 03/30/22 for proep eval. Noted to be maintaining sinus rhythm. Per note, "preoperative evaluation prior to knee replacement-she denies chest pain. Previous CTA did not show obstructive coronary disease. She may proceed without further cardiac evaluation. 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 She is maintained on Plaquenil. Last seen by Dr. Deniece Ree 03/07/22. Per note, "PLAN: I discussed the case in detail with the patient. I have again reassured her that I am encouraged by the overall benign clinical course of her illness. I have not ordered any other neurologic testing today. After discussion we decided to try gabapentin 300 mg daily as needed for the heel pain. I have not ordered any other new medications at this time. I will see her back in the clinic in 6 months for a follow up appointment."  Preop labs reviewed, potassium mildly low at 3.3, otherwise unremarkable.   EKG 04/09/22: NSR. Rate 76. Low voltage QRS.  Coronary CT 06/16/19: IMPRESSION: 1.  Coronary calcium score of 36. This was 46 percentile for age and sex matched control.  2. Normal coronary origin with right dominance.  3. Minimal CAD with calcified plaque (0-24%) in the proximal LM, proximal and mid LAD and proximal Lcx; CADRADS-1.  TTE 05/25/19: 1. Left ventricular ejection fraction, by visual estimation, is 60 to   65%. The left ventricle has normal function. There is no left ventricular  hypertrophy.  2. The left ventricle has no regional wall motion abnormalities.  3. Left ventricular diastolic parameters are consistent with Grade I  diastolic dysfunction (impaired relaxation).  4. Elevated left ventricular end-diastolic pressure.  5. Global right ventricle has normal systolic function.The right  ventricular size is normal. No increase in right ventricular wall  thickness.  6. Left atrial size was mild-moderately dilated.  7. Right atrial size was normal.  8. The mitral valve is normal in structure. No evidence of mitral valve  regurgitation. No evidence of mitral stenosis.  9. The tricuspid valve is normal in structure. Tricuspid valve  regurgitation is trivial.  10. The aortic valve is tricuspid. Aortic valve regurgitation is not  visualized. Mild aortic valve sclerosis without stenosis.  11. The pulmonic valve was normal in structure. Pulmonic valve  regurgitation is not visualized.  12. 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  13. Normal pulmonary artery systolic pressure.  14. The inferior vena cava is normal in size with greater than 50%  respiratory variability, suggesting right atrial pressure of 3 mmHg.   )        Anesthesia Quick Evaluation

## 2022-04-12 NOTE — Progress Notes (Signed)
Anesthesia Chart Review:  Follows with cardiology for hx of afib s/p ablation 08/2020, on eliquis. Monitor October 2018 showed paroxysmal atririllation and flutter. Nuclear study October 2018 showed ejection fraction 67%, apical thinning but no ischemia. Placed on apixaban.  Had implantable loop recorder placed by Dr. Rayann Heman for syncope and recurrent palpitations. Echo repeated 2/21 showed normal LV function, grade 1 DD, mild to moderate LAE. Coronary CTA 2/21 showed Ca score 36 and minimal (0-24%) in proximal LM, proximal and mid LAD and proximal Lcx.  Patient had atrial fibrillation ablation at [facility] May 2022. Seen by Dr. [name] 03/30/22 for proep eval. Noted to be maintaining sinus rhythm. Per note, "preoperative evaluation prior to knee replacement-she denies chest pain. Previous CTA did not show obstructive coronary disease. She may proceed without further cardiac evaluation. 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 She is maintained on Plaquenil. Last seen by Dr. Deniece Ree 03/07/22. Per note, "PLAN: I discussed the case in detail with the patient. I have again reassured her that I am encouraged by the overall benign clinical course of her illness. I have not ordered any other neurologic testing today. After discussion we decided to try gabapentin 300 mg daily as needed for the heel pain. I have not ordered any other new medications at this time. I will see her back in the clinic in 6 months for a follow up appointment."  Preop labs reviewed, potassium mildly low at 3.3, otherwise unremarkable.   EKG 04/09/22: NSR. Rate 76. Low voltage QRS.  Coronary CT 06/16/19: IMPRESSION: 1. Coronary calcium score of 36. This was 71 percentile for age and sex matched control.   2. Normal  coronary origin with right dominance.   3. Minimal CAD with calcified plaque (0-24%) in the proximal LM, proximal and mid LAD and proximal Lcx; CADRADS-1.  TTE 05/25/19: 1. Left ventricular ejection fraction, by visual estimation, is 60 to  65%. The left ventricle has normal function. There is no left ventricular  hypertrophy.   2. The left ventricle has no regional wall motion abnormalities.   3. Left ventricular diastolic parameters are consistent with Grade I  diastolic dysfunction (impaired relaxation).   4. Elevated left ventricular end-diastolic pressure.   5. Global right ventricle has normal systolic function.The right  ventricular size is normal. No increase in right ventricular wall  thickness.   6. Left atrial size was mild-moderately dilated.   7. Right atrial size was normal.   8. The mitral valve is normal in structure. No evidence of mitral valve  regurgitation. No evidence of mitral stenosis.   9. The tricuspid valve is normal in structure. Tricuspid valve  regurgitation is trivial.  10. The aortic valve is tricuspid. Aortic valve regurgitation is not  visualized. Mild aortic valve sclerosis without stenosis.  11. The pulmonic valve was normal in structure. Pulmonic valve  regurgitation is not visualized.  12. 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  13. Normal pulmonary artery systolic pressure.  14. The inferior vena cava is normal in size with greater than 50%  respiratory variability, suggesting right atrial pressure of 3 mmHg.     Wynonia Musty [facility] Short Stay Center/Anesthesiology Phone [phone] 04/12/2022 3:13 PM

## 2022-04-12 NOTE — Telephone Encounter (Signed)
Patient need to call around 12:15..[phone], needs to talk to gil or Ninfa Linden asap about her surgery.Marland Kitchen

## 2022-04-12 NOTE — Progress Notes (Signed)
PCP - [name] Cardiologist - Dr. [name]  PPM/ICD - No Device Orders -  Rep Notified -   Chest x-ray - NI EKG - 04/09/22 Stress Test - 02/08/17 ECHO - 05/25/19 Cardiac Cath -Denies   Sleep Study - Denies  DM - Denies  Blood Thinner Instructions: Eliquis per patient last dose is for December 29th  ERAS Protcol -No  COVID TEST- NI   Anesthesia review: Yes cardiac history  Patient denies shortness of breath, fever, cough and chest pain at PAT appointment   All instructions explained to the patient, with a verbal understanding of the material. Patient agrees to go over the instructions while at home for a better understanding. The opportunity to ask questions was provided.

## 2022-04-12 NOTE — Telephone Encounter (Signed)
Called.

## 2022-04-18 ENCOUNTER — Other Ambulatory Visit: Payer: Self-pay

## 2022-04-19 ENCOUNTER — Encounter: Payer: Medicare PPO | Admitting: Physical Medicine & Rehabilitation

## 2022-04-19 ENCOUNTER — Other Ambulatory Visit: Payer: Self-pay | Admitting: Physician Assistant

## 2022-04-19 DIAGNOSIS — Z01818 Encounter for other preprocedural examination: Secondary | ICD-10-CM

## 2022-04-20 ENCOUNTER — Telehealth: Payer: Self-pay | Admitting: *Deleted

## 2022-04-20 NOTE — Care Plan (Signed)
[facility] RNCM call to patient to discuss her upcoming Right total knee arthroplasty on 04/24/22 with Dr. Ninfa Linden. She is an Ortho bundle patient through [facility] and is agreeable to case management. She currently lives at [facility] and is planning on going to rehab there at [facility] upon discharge from the hospital. She has a husband that has a RW she can use if needed. Reviewed post op care instructions and will continue to follow for CM needs.

## 2022-04-20 NOTE — Telephone Encounter (Signed)
Ortho bundle pre-op call completed. 

## 2022-04-24 ENCOUNTER — Inpatient Hospital Stay (HOSPITAL_COMMUNITY)
Admission: AD | Admit: 2022-04-24 | Discharge: 2022-04-27 | DRG: 470 | Disposition: A | Discharge status: Skilled Nursing Facility | Payer: Medicare PPO | Source: Skilled Nursing Facility | Attending: Orthopaedic Surgery | Admitting: Orthopaedic Surgery

## 2022-04-24 ENCOUNTER — Ambulatory Visit (HOSPITAL_BASED_OUTPATIENT_CLINIC_OR_DEPARTMENT_OTHER): Payer: Medicare PPO | Admitting: Anesthesiology

## 2022-04-24 ENCOUNTER — Encounter (HOSPITAL_COMMUNITY)
Admission: AD | Disposition: A | Discharge status: Skilled Nursing Facility | Payer: Self-pay | Source: Skilled Nursing Facility | Attending: Orthopaedic Surgery

## 2022-04-24 ENCOUNTER — Encounter (HOSPITAL_COMMUNITY): Payer: Self-pay | Admitting: Orthopaedic Surgery

## 2022-04-24 ENCOUNTER — Ambulatory Visit (HOSPITAL_COMMUNITY): Payer: Medicare PPO | Admitting: Physician Assistant

## 2022-04-24 ENCOUNTER — Other Ambulatory Visit: Payer: Self-pay

## 2022-04-24 ENCOUNTER — Observation Stay (HOSPITAL_COMMUNITY): Payer: Medicare PPO

## 2022-04-24 DIAGNOSIS — I4892 Unspecified atrial flutter: Secondary | ICD-10-CM | POA: Diagnosis present

## 2022-04-24 DIAGNOSIS — Z6836 Body mass index (BMI) 36.0-36.9, adult: Secondary | ICD-10-CM | POA: Diagnosis not present

## 2022-04-24 DIAGNOSIS — Z79899 Other long term (current) drug therapy: Secondary | ICD-10-CM

## 2022-04-24 DIAGNOSIS — E871 Hypo-osmolality and hyponatremia: Secondary | ICD-10-CM | POA: Diagnosis present

## 2022-04-24 DIAGNOSIS — M1711 Unilateral primary osteoarthritis, right knee: Secondary | ICD-10-CM

## 2022-04-24 DIAGNOSIS — Z8661 Personal history of infections of the central nervous system: Secondary | ICD-10-CM

## 2022-04-24 DIAGNOSIS — Z825 Family history of asthma and other chronic lower respiratory diseases: Secondary | ICD-10-CM

## 2022-04-24 DIAGNOSIS — I10 Essential (primary) hypertension: Secondary | ICD-10-CM | POA: Diagnosis present

## 2022-04-24 DIAGNOSIS — G8918 Other acute postprocedural pain: Secondary | ICD-10-CM | POA: Diagnosis not present

## 2022-04-24 DIAGNOSIS — Z823 Family history of stroke: Secondary | ICD-10-CM | POA: Diagnosis not present

## 2022-04-24 DIAGNOSIS — E669 Obesity, unspecified: Secondary | ICD-10-CM | POA: Diagnosis present

## 2022-04-24 DIAGNOSIS — Z471 Aftercare following joint replacement surgery: Secondary | ICD-10-CM | POA: Diagnosis not present

## 2022-04-24 DIAGNOSIS — Z01818 Encounter for other preprocedural examination: Secondary | ICD-10-CM

## 2022-04-24 DIAGNOSIS — M329 Systemic lupus erythematosus, unspecified: Secondary | ICD-10-CM | POA: Diagnosis present

## 2022-04-24 DIAGNOSIS — I2510 Atherosclerotic heart disease of native coronary artery without angina pectoris: Secondary | ICD-10-CM | POA: Diagnosis present

## 2022-04-24 DIAGNOSIS — Z888 Allergy status to other drugs, medicaments and biological substances status: Secondary | ICD-10-CM

## 2022-04-24 DIAGNOSIS — Z800 Family history of malignant neoplasm of digestive organs: Secondary | ICD-10-CM

## 2022-04-24 DIAGNOSIS — Z96651 Presence of right artificial knee joint: Secondary | ICD-10-CM | POA: Diagnosis not present

## 2022-04-24 DIAGNOSIS — R600 Localized edema: Secondary | ICD-10-CM | POA: Diagnosis not present

## 2022-04-24 DIAGNOSIS — I4891 Unspecified atrial fibrillation: Secondary | ICD-10-CM

## 2022-04-24 DIAGNOSIS — I4819 Other persistent atrial fibrillation: Secondary | ICD-10-CM | POA: Diagnosis present

## 2022-04-24 DIAGNOSIS — E785 Hyperlipidemia, unspecified: Secondary | ICD-10-CM | POA: Diagnosis present

## 2022-04-24 DIAGNOSIS — Z8616 Personal history of COVID-19: Secondary | ICD-10-CM

## 2022-04-24 DIAGNOSIS — Z8249 Family history of ischemic heart disease and other diseases of the circulatory system: Secondary | ICD-10-CM

## 2022-04-24 DIAGNOSIS — Z743 Need for continuous supervision: Secondary | ICD-10-CM | POA: Diagnosis not present

## 2022-04-24 DIAGNOSIS — F419 Anxiety disorder, unspecified: Secondary | ICD-10-CM | POA: Diagnosis not present

## 2022-04-24 HISTORY — PX: TOTAL KNEE ARTHROPLASTY: SHX125

## 2022-04-24 LAB — TYPE AND SCREEN
ABO/RH(D): O POS
Antibody Screen: NEGATIVE

## 2022-04-24 LAB — ABO/RH: ABO/RH(D): O POS

## 2022-04-24 SURGERY — ARTHROPLASTY, KNEE, TOTAL
Anesthesia: Spinal | Site: Knee | Laterality: Right

## 2022-04-24 MED ORDER — FENTANYL CITRATE PF 50 MCG/ML IJ SOSY: 50.0000 ug | PREFILLED_SYRINGE | Freq: Once | INTRAMUSCULAR | Status: DC

## 2022-04-24 MED ORDER — 0.9 % SODIUM CHLORIDE (POUR BTL) OPTIME
TOPICAL | Status: DC | PRN
  Administered 2022-04-24: 1000 mL

## 2022-04-24 MED ORDER — CEFAZOLIN SODIUM-DEXTROSE 2-4 GM/100ML-% IV SOLN
2.0000 g | INTRAVENOUS | Status: AC
  Administered 2022-04-24: 2 g via INTRAVENOUS
  Filled 2022-04-24: qty 100

## 2022-04-24 MED ORDER — OXYCODONE HCL 5 MG PO TABS
ORAL_TABLET | ORAL | Status: AC
  Administered 2022-04-25: 10 mg via ORAL
  Filled 2022-04-24: qty 1

## 2022-04-24 MED ORDER — HYDROMORPHONE HCL 2 MG PO TABS
2.0000 mg | ORAL_TABLET | ORAL | Status: DC | PRN
  Administered 2022-04-26: 2 mg via ORAL
  Filled 2022-04-24: qty 1

## 2022-04-24 MED ORDER — TRANEXAMIC ACID-NACL 1000-0.7 MG/100ML-% IV SOLN
1000.0000 mg | INTRAVENOUS | Status: AC
  Administered 2022-04-24: 1000 mg via INTRAVENOUS
  Filled 2022-04-24: qty 100

## 2022-04-24 MED ORDER — ACETAMINOPHEN 160 MG/5ML PO SOLN: 325.0000 mg | ORAL | Status: DC | PRN

## 2022-04-24 MED ORDER — ACETAMINOPHEN 325 MG PO TABS
325.0000 mg | ORAL_TABLET | Freq: Four times a day (QID) | ORAL | Status: DC | PRN
  Administered 2022-04-26: 650 mg via ORAL
  Filled 2022-04-24 (×2): qty 2

## 2022-04-24 MED ORDER — POVIDONE-IODINE 10 % EX SWAB
2.0000 | Freq: Once | CUTANEOUS | Status: AC
  Administered 2022-04-24: 2 via TOPICAL

## 2022-04-24 MED ORDER — ACETAMINOPHEN 325 MG PO TABS
325.0000 mg | ORAL_TABLET | ORAL | Status: DC | PRN
  Administered 2022-04-24: 650 mg via ORAL

## 2022-04-24 MED ORDER — ORAL CARE MOUTH RINSE: 15.0000 mL | Freq: Once | OROMUCOSAL | Status: AC

## 2022-04-24 MED ORDER — METHOCARBAMOL 1000 MG/10ML IJ SOLN: 500.0000 mg | Freq: Four times a day (QID) | INTRAVENOUS | Status: DC | PRN

## 2022-04-24 MED ORDER — CEFAZOLIN SODIUM-DEXTROSE 1-4 GM/50ML-% IV SOLN
1.0000 g | Freq: Four times a day (QID) | INTRAVENOUS | Status: AC
  Administered 2022-04-24 – 2022-04-25 (×2): 1 g via INTRAVENOUS
  Filled 2022-04-24 (×2): qty 50

## 2022-04-24 MED ORDER — OXYCODONE HCL 5 MG/5ML PO SOLN: 5.0000 mg | Freq: Once | ORAL | Status: DC | PRN

## 2022-04-24 MED ORDER — METOCLOPRAMIDE HCL 5 MG/ML IJ SOLN: 5.0000 mg | Freq: Three times a day (TID) | INTRAMUSCULAR | Status: DC | PRN

## 2022-04-24 MED ORDER — LACTATED RINGERS IV SOLN: INTRAVENOUS | Status: DC

## 2022-04-24 MED ORDER — PROPOFOL 500 MG/50ML IV EMUL
INTRAVENOUS | Status: DC | PRN
  Administered 2022-04-24: 50 ug/kg/min via INTRAVENOUS

## 2022-04-24 MED ORDER — ONDANSETRON HCL 4 MG/2ML IJ SOLN: 4.0000 mg | Freq: Once | INTRAMUSCULAR | Status: DC | PRN

## 2022-04-24 MED ORDER — OXYCODONE HCL 5 MG PO TABS: 5.0000 mg | ORAL_TABLET | Freq: Once | ORAL | Status: DC | PRN

## 2022-04-24 MED ORDER — BUPIVACAINE IN DEXTROSE 0.75-8.25 % IT SOLN
INTRATHECAL | Status: DC | PRN
  Administered 2022-04-24: 1.6 mL via INTRATHECAL

## 2022-04-24 MED ORDER — MIDAZOLAM HCL 2 MG/2ML IJ SOLN
INTRAMUSCULAR | Status: AC
  Filled 2022-04-24: qty 2

## 2022-04-24 MED ORDER — OXYCODONE HCL 5 MG PO TABS
5.0000 mg | ORAL_TABLET | ORAL | Status: DC | PRN
  Administered 2022-04-24: 10 mg via ORAL
  Administered 2022-04-24: 5 mg via ORAL
  Administered 2022-04-25 – 2022-04-27 (×5): 10 mg via ORAL
  Filled 2022-04-24 (×11): qty 2

## 2022-04-24 MED ORDER — HYDROMORPHONE HCL 1 MG/ML IJ SOLN
0.5000 mg | INTRAMUSCULAR | Status: DC | PRN
  Administered 2022-04-25 – 2022-04-26 (×5): 1 mg via INTRAVENOUS
  Filled 2022-04-24 (×5): qty 1

## 2022-04-24 MED ORDER — METOCLOPRAMIDE HCL 5 MG PO TABS: 5.0000 mg | ORAL_TABLET | Freq: Three times a day (TID) | ORAL | Status: DC | PRN

## 2022-04-24 MED ORDER — METHOCARBAMOL 500 MG PO TABS
500.0000 mg | ORAL_TABLET | Freq: Four times a day (QID) | ORAL | Status: DC | PRN
  Administered 2022-04-24 – 2022-04-27 (×7): 500 mg via ORAL
  Filled 2022-04-24 (×9): qty 1

## 2022-04-24 MED ORDER — MEPERIDINE HCL 25 MG/ML IJ SOLN: 6.2500 mg | INTRAMUSCULAR | Status: DC | PRN

## 2022-04-24 MED ORDER — FENTANYL CITRATE (PF) 100 MCG/2ML IJ SOLN
INTRAMUSCULAR | Status: AC
  Administered 2022-04-24: 100 ug
  Filled 2022-04-24: qty 2

## 2022-04-24 MED ORDER — ONDANSETRON HCL 4 MG/2ML IJ SOLN
4.0000 mg | Freq: Four times a day (QID) | INTRAMUSCULAR | Status: DC | PRN
  Administered 2022-04-25: 4 mg via INTRAVENOUS
  Filled 2022-04-24: qty 2

## 2022-04-24 MED ORDER — MIDAZOLAM HCL 2 MG/2ML IJ SOLN
INTRAMUSCULAR | Status: DC | PRN
  Administered 2022-04-24: 1 mg via INTRAVENOUS

## 2022-04-24 MED ORDER — FENTANYL CITRATE (PF) 100 MCG/2ML IJ SOLN: 25.0000 ug | INTRAMUSCULAR | Status: DC | PRN

## 2022-04-24 MED ORDER — METHOCARBAMOL 500 MG PO TABS
ORAL_TABLET | ORAL | Status: AC
  Administered 2022-04-25: 500 mg via ORAL
  Filled 2022-04-24: qty 1

## 2022-04-24 MED ORDER — SODIUM CHLORIDE 0.9 % IR SOLN
Status: DC | PRN
  Administered 2022-04-24: 1000 mL

## 2022-04-24 MED ORDER — ONDANSETRON HCL 4 MG PO TABS: 4.0000 mg | ORAL_TABLET | Freq: Four times a day (QID) | ORAL | Status: DC | PRN

## 2022-04-24 MED ORDER — ACETAMINOPHEN 325 MG PO TABS
ORAL_TABLET | ORAL | Status: AC
  Filled 2022-04-24: qty 2

## 2022-04-24 MED ORDER — CHLORHEXIDINE GLUCONATE 0.12 % MT SOLN
15.0000 mL | Freq: Once | OROMUCOSAL | Status: AC
  Administered 2022-04-24: 15 mL via OROMUCOSAL
  Filled 2022-04-24: qty 15

## 2022-04-24 MED ORDER — PHENYLEPHRINE HCL-NACL 20-0.9 MG/250ML-% IV SOLN
INTRAVENOUS | Status: DC | PRN
  Administered 2022-04-24: 30 ug/min via INTRAVENOUS

## 2022-04-24 SURGICAL SUPPLY — 70 items
BANDAGE ESMARK 6X9 LF (GAUZE/BANDAGES/DRESSINGS) ×1 IMPLANT
BLADE SAG 18X100X1.27 (BLADE) ×1 IMPLANT
BNDG ELASTIC 6X5.8 VLCR STR LF (GAUZE/BANDAGES/DRESSINGS) ×2 IMPLANT
BNDG ESMARK 6X9 LF (GAUZE/BANDAGES/DRESSINGS) ×1
BOWL SMART MIX CTS (DISPOSABLE) ×1 IMPLANT
CEMENT BONE R 1X40 (Cement) ×2 IMPLANT
COOLER ICEMAN CLASSIC (MISCELLANEOUS) IMPLANT
COVER SURGICAL LIGHT HANDLE (MISCELLANEOUS) ×1 IMPLANT
CUFF TOURN SGL QUICK 34 (TOURNIQUET CUFF) ×1
CUFF TRNQT CYL 34X4.125X (TOURNIQUET CUFF) ×1 IMPLANT
DRAPE EXTREMITY T 121X128X90 (DISPOSABLE) ×1 IMPLANT
DRAPE HALF SHEET 40X57 (DRAPES) ×1 IMPLANT
DRAPE U-SHAPE 47X51 STRL (DRAPES) ×1 IMPLANT
DURAPREP 26ML APPLICATOR (WOUND CARE) ×1 IMPLANT
ELECT CAUTERY BLADE 6.4 (BLADE) ×1 IMPLANT
ELECT REM PT RETURN 9FT ADLT (ELECTROSURGICAL) ×1
ELECTRODE REM PT RTRN 9FT ADLT (ELECTROSURGICAL) ×1 IMPLANT
FACESHIELD WRAPAROUND (MASK) ×2 IMPLANT
FACESHIELD WRAPAROUND OR TEAM (MASK) ×2 IMPLANT
FEMUR CMT CR STD SZ 8 RT KNEE (Joint) ×1 IMPLANT
FEMUR CMTD CR STD SZ 8 RT KNEE (Joint) IMPLANT
GAUZE PAD ABD 8X10 STRL (GAUZE/BANDAGES/DRESSINGS) ×1 IMPLANT
GAUZE SPONGE 4X4 12PLY STRL (GAUZE/BANDAGES/DRESSINGS) ×1 IMPLANT
GAUZE XEROFORM 1X8 LF (GAUZE/BANDAGES/DRESSINGS) ×1 IMPLANT
GLOVE BIOGEL PI IND STRL 8 (GLOVE) ×2 IMPLANT
GLOVE ORTHO TXT STRL SZ7.5 (GLOVE) ×1 IMPLANT
GLOVE SURG ORTHO 8.0 STRL STRW (GLOVE) ×1 IMPLANT
GOWN STRL REUS W/ TWL LRG LVL3 (GOWN DISPOSABLE) IMPLANT
GOWN STRL REUS W/ TWL XL LVL3 (GOWN DISPOSABLE) ×2 IMPLANT
GOWN STRL REUS W/TWL LRG LVL3 (GOWN DISPOSABLE)
GOWN STRL REUS W/TWL XL LVL3 (GOWN DISPOSABLE) ×2
HANDPIECE INTERPULSE COAX TIP (DISPOSABLE) ×1
HDLS TROCR DRIL PIN KNEE 75 (PIN) ×1
IMMOBILIZER KNEE 22 UNIV (SOFTGOODS) ×1 IMPLANT
INSERT TIBIA KNEE RIGHT 10 (Joint) IMPLANT
IV NS 1000ML (IV SOLUTION) ×1
IV NS 1000ML BAXH (IV SOLUTION) ×1 IMPLANT
KIT BASIN OR (CUSTOM PROCEDURE TRAY) ×1 IMPLANT
KIT TURNOVER KIT B (KITS) ×1 IMPLANT
MANIFOLD NEPTUNE II (INSTRUMENTS) ×1 IMPLANT
NDL 18GX1X1/2 (RX/OR ONLY) (NEEDLE) IMPLANT
NEEDLE 18GX1X1/2 (RX/OR ONLY) (NEEDLE) IMPLANT
NS IRRIG 1000ML POUR BTL (IV SOLUTION) ×1 IMPLANT
PACK TOTAL JOINT (CUSTOM PROCEDURE TRAY) ×1 IMPLANT
PAD ARMBOARD 7.5X6 YLW CONV (MISCELLANEOUS) ×1 IMPLANT
PAD COLD SHLDR WRAP-ON (PAD) IMPLANT
PADDING CAST COTTON 6X4 STRL (CAST SUPPLIES) ×1 IMPLANT
PIN DRILL HDLS TROCAR 75 4PK (PIN) IMPLANT
SCREW FEMALE HEX FIX 25X2.5 (ORTHOPEDIC DISPOSABLE SUPPLIES) IMPLANT
SET HNDPC FAN SPRY TIP SCT (DISPOSABLE) ×1 IMPLANT
SET PAD KNEE POSITIONER (MISCELLANEOUS) ×1 IMPLANT
STAPLER VISISTAT 35W (STAPLE) ×1 IMPLANT
STEM POLY PAT PLY 32M KNEE (Knees) IMPLANT
STEM TIBIA 5 DEG SZ E R KNEE (Knees) IMPLANT
SUCTION FRAZIER HANDLE 10FR (MISCELLANEOUS) ×1
SUCTION FRAZIER TIP 8 FR DISP (SUCTIONS) ×1
SUCTION TUBE FRAZIER 10FR DISP (MISCELLANEOUS) ×1 IMPLANT
SUCTION TUBE FRAZIER 8FR DISP (SUCTIONS) IMPLANT
SUT VIC AB 0 CT1 27 (SUTURE) ×1
SUT VIC AB 0 CT1 27XBRD ANBCTR (SUTURE) ×1 IMPLANT
SUT VIC AB 1 CT1 27 (SUTURE) ×2
SUT VIC AB 1 CT1 27XBRD ANBCTR (SUTURE) ×2 IMPLANT
SUT VIC AB 2-0 CT1 27 (SUTURE) ×2
SUT VIC AB 2-0 CT1 TAPERPNT 27 (SUTURE) ×2 IMPLANT
SYR 50ML LL SCALE MARK (SYRINGE) IMPLANT
TIBIA STEM 5 DEG SZ E R KNEE (Knees) ×1 IMPLANT
TOWEL GREEN STERILE (TOWEL DISPOSABLE) ×1 IMPLANT
TOWEL GREEN STERILE FF (TOWEL DISPOSABLE) ×1 IMPLANT
TRAY CATH 16FR W/PLASTIC CATH (SET/KITS/TRAYS/PACK) IMPLANT
WRAP KNEE MAXI GEL POST OP (GAUZE/BANDAGES/DRESSINGS) ×1 IMPLANT

## 2022-04-24 NOTE — Anesthesia Procedure Notes (Signed)
Procedure Name: MAC Date/Time: 04/24/2022 2:02 PM  Performed by: Valda Favia, CRNAPre-anesthesia Checklist: Patient identified, Emergency Drugs available, Suction available, Patient being monitored and Timeout performed Patient Re-evaluated:Patient Re-evaluated prior to induction Oxygen Delivery Method: Simple face mask Preoxygenation: Pre-oxygenation with 100% oxygen Induction Type: IV induction Placement Confirmation: positive ETCO2 Dental Injury: Teeth and Oropharynx as per pre-operative assessment

## 2022-04-24 NOTE — Transfer of Care (Signed)
Immediate Anesthesia Transfer of Care Note  Patient: Lindsay Clark  Procedure(s) Performed: RIGHT TOTAL KNEE ARTHROPLASTY (Right: Knee)  Patient Location: PACU  Anesthesia Type:MAC and Spinal  Level of Consciousness: drowsy  Airway & Oxygen Therapy: Patient Spontanous Breathing  Post-op Assessment: Report given to RN and Post -op Vital signs reviewed and stable  Post vital signs: Reviewed and stable  Last Vitals:  Vitals Value Taken Time  BP 95/46 04/24/22 1528  Temp 36.5 C 04/24/22 1528  Pulse 62 04/24/22 1530  Resp 13 04/24/22 1530  SpO2 99 % 04/24/22 1530  Vitals shown include unvalidated device data.  Last Pain:  Vitals:   04/24/22 1246  TempSrc:   PainSc: 0-No pain         Complications: No notable events documented.

## 2022-04-24 NOTE — Anesthesia Postprocedure Evaluation (Signed)
Anesthesia Post Note  Patient: Lindsay Clark  Procedure(s) Performed: RIGHT TOTAL KNEE ARTHROPLASTY (Right: Knee)     Patient location during evaluation: PACU Anesthesia Type: Spinal Level of consciousness: oriented and awake and alert Pain management: pain level controlled Vital Signs Assessment: post-procedure vital signs reviewed and stable Respiratory status: spontaneous breathing, respiratory function stable and patient connected to nasal cannula oxygen Cardiovascular status: blood pressure returned to baseline and stable Postop Assessment: no headache, no backache and no apparent nausea or vomiting Anesthetic complications: no   No notable events documented.  Last Vitals:  Vitals:   04/24/22 1615 04/24/22 1630  BP: 101/64 (!) 107/56  Pulse: (!) 57 (!) 55  Resp: 11 12  Temp:    SpO2: 100% 98%    Last Pain:  Vitals:   04/24/22 1645  TempSrc:   PainSc: 0-No pain    LLE Motor Response: Purposeful movement (04/24/22 1645) LLE Sensation: Decreased (04/24/22 1645) RLE Motor Response: No movement due to regional block (04/24/22 1645) RLE Sensation: Decreased (04/24/22 1645) L Sensory Level: S1-Sole of foot, small toes (04/24/22 1645) R Sensory Level: L5-Outer lower leg, top of foot, great toe (04/24/22 1645)  Effie Berkshire

## 2022-04-24 NOTE — Anesthesia Procedure Notes (Signed)
Spinal  Start time: 04/24/2022 1:35 PM End time: 04/24/2022 1:37 PM Reason for block: surgical anesthesia Staffing Performed: anesthesiologist  Anesthesiologist: Effie Berkshire, MD Performed by: Effie Berkshire, MD Authorized by: Effie Berkshire, MD   Preanesthetic Checklist Completed: patient identified, IV checked, site marked, risks and benefits discussed, surgical consent, monitors and equipment checked, pre-op evaluation and timeout performed Spinal Block Patient position: sitting Prep: DuraPrep and site prepped and draped Location: L3-4 Injection technique: single-shot Needle Needle type: Pencan  Needle gauge: 24 G Needle length: 10 cm Needle insertion depth: 10 cm Assessment Events: CSF return Additional Notes Patient tolerated well. No immediate complications.  Functioning IV was confirmed and monitors were applied. Sterile prep and drape, including hand hygiene and sterile gloves were used. The patient was positioned and the back was prepped. The skin was anesthetized with lidocaine. Free flow of clear CSF was obtained prior to injecting local anesthetic into the CSF. The spinal needle aspirated freely following injection. The needle was carefully withdrawn. The patient tolerated the procedure well.

## 2022-04-24 NOTE — H&P (Signed)
TOTAL KNEE ADMISSION H&P  Patient is being admitted for right total knee arthroplasty.  Subjective:  Chief Complaint:right knee pain.  HPI: Lindsay Clark, 82 y.o. female, has a history of pain and functional disability in the right knee due to arthritis and has failed non-surgical conservative treatments for greater than 12 weeks to includeNSAID's and/or analgesics, corticosteriod injections, viscosupplementation injections, flexibility and strengthening excercises, supervised PT with diminished ADL's post treatment, use of assistive devices, weight reduction as appropriate, and activity modification.  Onset of symptoms was gradual, starting 3 years ago with gradually worsening course since that time. The patient noted no past surgery on the right knee(s).  Patient currently rates pain in the right knee(s) at 10 out of 10 with activity. Patient has night pain, worsening of pain with activity and weight bearing, pain that interferes with activities of daily living, pain with passive range of motion, crepitus, and joint swelling.  Patient has evidence of subchondral sclerosis, periarticular osteophytes, and joint space narrowing by imaging studies. There is no active infection.  Patient Active Problem List   Diagnosis Date Noted   Acquired genu valgum 10/05/2021   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   Left knee pain 08/09/2020   Persistent atrial fibrillation ([place]) 04/05/2020   Secondary hypercoagulable state ([place]) 04/05/2020   Primary osteoarthritis of left knee 09/02/2019   Prolapsed internal hemorrhoids, grade 2 03/03/2019   Brain lesion 06/24/2017   Osteoarthritis    Multiple lesions on CT of brain and spine    Movement disorder    Lumbar spondylosis    Hyperlipidemia    DDD (degenerative disc disease), lumbar    Dermatitis    Cervical spondylosis    Cervical disc disease    Diastolic dysfunction 97/35/3299   Syncope 04/18/2016   Anxiety 04/18/2016    Hypertension    Autoimmune disorder-autoimmune encephalitis    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   Coronary artery calcification of native artery 11/04/2014   Abnormal finding on MRI of brain 10/04/2014   Pruritus 24/26/8341   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   Past Medical History:  Diagnosis Date   Atherosclerosis    mild cerebral   Atrial fibrillation and flutter (HCC)    Autoimmune disorder (HCC)    Autoimmune encephalitis    Cervical disc disease    with spondylosis   Cervical spondylosis    Chronic dermatitis    eczematous   Colitis    COVID 10/2020   DDD (degenerative disc disease), lumbar    Frozen shoulder    left   Hyperlipidemia    Hypertension    IBS (irritable bowel syndrome)    with diarrhea Pt stated she never had this   Lateral epicondylitis    Lumbar spondylosis    Lupus (HCC)    Movement disorder    Multiple lesions on CT of brain and spine    reports lesions from previous CT scan at [facility]    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   PONV (postoperative nausea and vomiting)    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   Torn meniscus    left    Past Surgical History:  Procedure Laterality Date   A FLUTTER ABLATION  08/2020   [facility] and had cardioversion at the same time   CATARACT EXTRACTION     COLONOSCOPY     EYE SURGERY Left    HEMORRHOID BANDING     KNEE ARTHROSCOPY Left 08/09/2020   Procedure: LEFT KNEE ARTHROSCOPY MEDIAL AND LATERAL  [place];  Surgeon: Melrose Nakayama, MD;  Location: [facility];  Service: Orthopedics;  Laterality: Left;   LOOP RECORDER INSERTION N/A 07/05/2017   Procedure: LOOP RECORDER INSERTION;  Surgeon: Thompson Grayer, MD;  Location: [facility];  Service: Cardiovascular;  Laterality: N/A;   TUBAL LIGATION  1980   WISDOM TOOTH EXTRACTION      Current Facility-Administered Medications  Medication Dose Route Frequency Provider Last Rate Last Admin   ceFAZolin (ANCEF) IVPB 2g/100 mL premix  2 g  Intravenous On Call to OR Pete Pelt, PA-C       chlorhexidine (PERIDEX) 0.12 % solution 15 mL  15 mL Mouth/Throat Once Roderic Palau, MD       Or   Oral care mouth rinse  15 mL Mouth Rinse Once Roderic Palau, MD       lactated ringers infusion   Intravenous Continuous Roderic Palau, MD       povidone-iodine 10 % swab 2 Application  2 Application Topical Once Pete Pelt, PA-C       tranexamic acid (CYKLOKAPRON) IVPB 1,000 mg  1,000 mg Intravenous To OR Pete Pelt, PA-C       Allergies  Allergen Reactions   Amlodipine Swelling     leg swelling    Diltiazem Swelling    Bilateral feet   Methotrexate Swelling     swelling in ankles    Metronidazole Swelling     leg swelling   Other Other (See Comments)    Medication:Methychloroisothlazolinone/methylisothiazilinoneCL +Me-Isothiazolinone Reaction: unknown-patch testing Medication:glyceryl thioglycolate  Reaction:unknown-patch testing   Quaternium-15 Other (See Comments)    unknown reaction-patching testing    Social History   Tobacco Use   Smoking status: Never   Smokeless tobacco: Never  Substance Use Topics   Alcohol use: Yes    Alcohol/week: 4.0 standard drinks of alcohol    Types: 4 Glasses of wine per week    Comment: 7 glasses of wine weekly    Family History  Problem Relation Age of Onset   Cancer Mother 51       colon cancer   Stroke Mother    Colon cancer Mother 75   Heart attack Father 18   Emphysema Father    Cancer Maternal Grandmother 36       uterine   Thyroid disease Maternal Grandmother    Thrombosis Maternal Grandfather    Thyroid disease Son      Review of Systems  Musculoskeletal:  Positive for back pain, gait problem and joint swelling.  All other systems reviewed and are negative.   Objective:  Physical Exam Vitals reviewed.  Constitutional:      Appearance: Normal appearance. She is obese.  HENT:     Head: Normocephalic and atraumatic.  Eyes:      Extraocular Movements: Extraocular movements intact.     Pupils: Pupils are equal, round, and reactive to light.  Cardiovascular:     Rate and Rhythm: Normal rate.     Pulses: Normal pulses.  Pulmonary:     Effort: Pulmonary effort is normal.     Breath sounds: Normal breath sounds.  Abdominal:     Palpations: Abdomen is soft.  Musculoskeletal:     Cervical back: Normal range of motion and neck supple.     Right knee: Effusion, bony tenderness and crepitus present. Decreased range of motion. Tenderness present over the medial joint line and lateral joint line. Abnormal alignment and abnormal meniscus.  Neurological:  Mental Status: She is alert and oriented to person, place, and time.  Psychiatric:        Behavior: Behavior normal.     Vital signs in last 24 hours: Temp:  [98.1 F (36.7 C)] 98.1 F (36.7 C) (01/02 1202) Pulse Rate:  [75] 75 (01/02 1202) Resp:  [17] 17 (01/02 1202) BP: (138)/(65) 138/65 (01/02 1202) SpO2:  [96 %] 96 % (01/02 1202) Weight:  [96.2 kg] 96.2 kg (01/02 1202)  Labs:   Estimated body mass index is 36.39 kg/m as calculated from the following:   Height as of this encounter: '5\' 4"'$  (1.626 m).   Weight as of this encounter: 96.2 kg.   Imaging Review Plain radiographs demonstrate severe degenerative joint disease of the right knee(s). The overall alignment ismild varus. The bone quality appears to be good for age and reported activity level.      Assessment/Plan:  End stage arthritis, right knee   The patient history, physical examination, clinical judgment of the provider and imaging studies are consistent with end stage degenerative joint disease of the right knee(s) and total knee arthroplasty is deemed medically necessary. The treatment options including medical management, injection therapy arthroscopy and arthroplasty were discussed at length. The risks and benefits of total knee arthroplasty were presented and reviewed. The risks due to  aseptic loosening, infection, stiffness, patella tracking problems, thromboembolic complications and other imponderables were discussed. The patient acknowledged the explanation, agreed to proceed with the plan and consent was signed. Patient is being admitted for inpatient treatment for surgery, pain control, PT, OT, prophylactic antibiotics, VTE prophylaxis, progressive ambulation and ADL's and discharge planning. The patient is planning to be discharged to skilled nursing facility

## 2022-04-24 NOTE — Op Note (Signed)
Operative Note  Date of operation: 04/24/2022 Preoperative diagnosis: Right knee primary osteoarthritis Postoperative diagnosis: Same  Procedure: Right cemented total knee arthroplasty  Implants: Biomet/Zimmer persona cemented knee system with size 8 right CR standard femur, size E right tibial tray, 10 mm thickness medial congruent fixed-bearing polythene insert, 32 mm patella button  Surgeon: Lind Guest. Ninfa Linden, MD Assistant: Benita Stabile, PA-C  Anesthesia: #1 right regional adductor canal block, #2 spinal Tourniquet time: Under 1 hour EBL: Less than 100 cc Antibiotics: 2 g IV Ancef Complications: None  Indications: The patient is a 82 year old female with debilitating arthritis and pain involving both of her knees.  Her left knee has been more problematic for a longer period time but her right knee has become acutely painful to her.  We had originally scheduled her for a left total knee arthroplasty but the last 2 weeks she wishes to proceed with a right knee replacement first.  She has tried and failed conservative treatment for both knees.  At this point her pain is daily with both knees and it is detrimentally affecting her mobility, her quality of life and actives daily living.  We are going to proceed with a knee replaced on the right side today.  We talked in length in detail about the risk of acute blood loss anemia, nerve vessel injury, fracture, infection, DVT, implant failure and wound healing issues.  She understands her goals are hopefully decrease pain, improve mobility, and improve quality of life.  Procedure description: After informed consent was obtained and the appropriate right knee was marked, anesthesia obtained a regional block in the holding room and she was brought to the operating room and set up on the stretcher where spinal anesthesia was obtained.  She was then laid in supine position on the stretcher and a Foley catheter was placed.  A nonsterile tourniquet is  placed around her upper right thigh and her right thigh, knee, leg, and ankle were prepped and draped with DuraPrep and sterile drapes including a sterile stockinette.  Timeout was called and she identified as correct patient the correct right knee.  An Esmarch was used to wrap out the leg and the tourniquet was inflated to 300 mm of pressure.  A direct midline incision was then made over the patella and carried proximally distally.  Dissection was carried down to the knee joint and a medial parapatellar arthrotomy was made.  A large joint effusion was encountered.  With the knee in a flexed position we found significant areas of cartilage loss in all 3 compartments.  Remnants of the ACL and medial lateral meniscus were removed as well as osteophytes from all 3 compartments.  We then made our proximal tibia cut correction for varus valgus using an extramedullary guide taking 2 mm off the low side and a 3 degree slope.  We made this cut without difficulty we then went to the femur and used an intramedullary distal femoral cutting guide setting this for a right knee at 5 degrees externally rotated for 10 mm distal femoral cut.  We made the cut without difficulty and brought the knee back down to full extension and with a 10 mm extension block and achieve full extension.  We then back to the femur and put a femoral sizing guide based off of the epicondylar axis.  Based off of this we chose a size 8 femur.  We put a 4-in-1 cutting block for size 8 femur and made her anterior and posterior cuts followed by  her chamfer cuts.  We then backed the tibia and chose a size E right tibial tray for coverage over the tibial plateau setting the rotation of the tibial tubercle on the femur.  We made our keel punch and drill hole cut off of this easily.  We then trialed our size D right tibia combined with our size 8 right CR standard femur.  We placed a 10 mm thickness medial congruent polythene insert and then made a patella cut  and drilled 3 holes for a size 32 patella button.  With all trial components in the knee we put her through several cycles of motion and we are pleased with range of motion and stability.  All trials rotation was removed.  With the knee was then irrigated with normal saline solution using pulsatile lavage.  It was dried well and then with the knee and then with the knee in a flexed position we mixed our cement and cemented our Biomet/Zimmer right size 8 tibial tray followed by cementing our size 8 right CR standard femur.  We removed excess cement debris from the knee and placed our 10 mm thickness right medial congruent polythene insert.  We cemented our size 32 patella button.  We then held the knee fully extended and compressed while the cement hardened.  Once it hardened the tourniquet was let down and hemostasis was obtained electrocautery.  The arthrotomy was closed with interrupted #1 Vicryl suture followed by 0 Vicryl close the deep tissue and 2-0 Vicryl close subcutaneous tissue.  The skin was closed with staples.  Well-padded sterile dressings applied.  The patient was taken off the operating table and taken to recovery in stable addition with all final counts being correct and no complications noted.  Benita Stabile, PA-C did assisted in the entire case from beginning to end and his assistance was medically necessary for helping retract soft tissue, guide implant placement and a layered closure of the wound.

## 2022-04-24 NOTE — Anesthesia Procedure Notes (Signed)
Anesthesia Regional Block: Adductor canal block   Pre-Anesthetic Checklist: , timeout performed,  Correct Patient, Correct Site, Correct Laterality,  Correct Procedure, Correct Position, site marked,  Risks and benefits discussed,  Surgical consent,  Pre-op evaluation,  At surgeon's request and post-op pain management  Laterality: Right  Prep: chloraprep       Needles:  Injection technique: Single-shot  Needle Type: Echogenic Stimulator Needle     Needle Length: 9cm  Needle Gauge: 21     Additional Needles:   Procedures:,,,, ultrasound used (permanent image in chart),,    Narrative:  Start time: 04/24/2022 1:05 PM End time: 04/24/2022 1:10 PM Injection made incrementally with aspirations every 5 mL.  Performed by: Personally  Anesthesiologist: Effie Berkshire, MD  Additional Notes: Discussed risks and benefits of the nerve block in detail, including but not limited vascular injury, permanent nerve damage and infection.   Patient tolerated the procedure well. Local anesthetic introduced in an incremental fashion under minimal resistance after negative aspirations. No paresthesias were elicited. After completion of the procedure, no acute issues were identified and patient continued to be monitored by RN.

## 2022-04-25 LAB — BASIC METABOLIC PANEL
Anion gap: 12 (ref 5–15)
BUN: 11 mg/dL (ref 8–23)
CO2: 29 mmol/L (ref 22–32)
Calcium: 8.1 mg/dL — ABNORMAL LOW (ref 8.9–10.3)
Chloride: 97 mmol/L — ABNORMAL LOW (ref 98–111)
Creatinine, Ser: 0.56 mg/dL (ref 0.44–1.00)
GFR, Estimated: >60 mL/min (ref >60)
Glucose, Bld: 127 mg/dL — ABNORMAL HIGH (ref 70–99)
Potassium: 2.9 mmol/L — ABNORMAL LOW (ref 3.5–5.1)
Sodium: 138 mmol/L (ref 135–145)

## 2022-04-25 MED ORDER — ADULT MULTIVITAMIN W/MINERALS CH
1.0000 | ORAL_TABLET | Freq: Every day | ORAL | Status: DC
  Administered 2022-04-25 – 2022-04-27 (×3): 1 via ORAL
  Filled 2022-04-25 (×3): qty 1

## 2022-04-25 MED ORDER — MENTHOL 3 MG MT LOZG: 1.0000 | LOZENGE | OROMUCOSAL | Status: DC | PRN

## 2022-04-25 MED ORDER — POTASSIUM CHLORIDE CRYS ER 20 MEQ PO TBCR
20.0000 meq | EXTENDED_RELEASE_TABLET | Freq: Every day | ORAL | Status: DC
  Administered 2022-04-25 – 2022-04-26 (×2): 20 meq via ORAL
  Filled 2022-04-25 (×2): qty 1

## 2022-04-25 MED ORDER — METOPROLOL SUCCINATE ER 25 MG PO TB24
25.0000 mg | ORAL_TABLET | Freq: Every day | ORAL | Status: DC
  Administered 2022-04-25 – 2022-04-27 (×3): 25 mg via ORAL
  Filled 2022-04-25 (×3): qty 1

## 2022-04-25 MED ORDER — FUROSEMIDE 40 MG PO TABS
80.0000 mg | ORAL_TABLET | Freq: Every day | ORAL | Status: DC
  Administered 2022-04-25 – 2022-04-27 (×3): 80 mg via ORAL
  Filled 2022-04-25 (×3): qty 2

## 2022-04-25 MED ORDER — POLYETHYLENE GLYCOL 3350 17 G PO PACK: 17.0000 g | PACK | Freq: Every day | ORAL | Status: DC | PRN

## 2022-04-25 MED ORDER — VITAMIN D 25 MCG (1000 UNIT) PO TABS
2000.0000 [IU] | ORAL_TABLET | Freq: Three times a day (TID) | ORAL | Status: DC
  Administered 2022-04-25 – 2022-04-27 (×7): 2000 [IU] via ORAL
  Filled 2022-04-25 (×7): qty 2

## 2022-04-25 MED ORDER — SODIUM CHLORIDE 0.9 % IV SOLN: INTRAVENOUS | Status: DC

## 2022-04-25 MED ORDER — ALUM & MAG HYDROXIDE-SIMETH 200-200-20 MG/5ML PO SUSP: 30.0000 mL | ORAL | Status: DC | PRN

## 2022-04-25 MED ORDER — DOCUSATE SODIUM 100 MG PO CAPS
100.0000 mg | ORAL_CAPSULE | Freq: Two times a day (BID) | ORAL | Status: DC
  Administered 2022-04-25 – 2022-04-27 (×5): 100 mg via ORAL
  Filled 2022-04-25 (×5): qty 1

## 2022-04-25 MED ORDER — PANTOPRAZOLE SODIUM 40 MG PO TBEC
40.0000 mg | DELAYED_RELEASE_TABLET | Freq: Every day | ORAL | Status: DC
  Administered 2022-04-25 – 2022-04-27 (×3): 40 mg via ORAL
  Filled 2022-04-25 (×3): qty 1

## 2022-04-25 MED ORDER — APIXABAN 5 MG PO TABS
5.0000 mg | ORAL_TABLET | Freq: Two times a day (BID) | ORAL | Status: DC
  Administered 2022-04-25 – 2022-04-27 (×5): 5 mg via ORAL
  Filled 2022-04-25 (×5): qty 1

## 2022-04-25 MED ORDER — DIPHENHYDRAMINE HCL 12.5 MG/5ML PO ELIX: 12.5000 mg | ORAL_SOLUTION | ORAL | Status: DC | PRN

## 2022-04-25 MED ORDER — PHENOL 1.4 % MT LIQD: 1.0000 | OROMUCOSAL | Status: DC | PRN

## 2022-04-25 NOTE — Discharge Instructions (Signed)

## 2022-04-25 NOTE — Evaluation (Signed)
Physical Therapy Evaluation  Patient Details Name: Lindsay Clark MRN: [id number] DOB: 1941-04-06 Today's Date: 04/25/2022  History of Present Illness  Pt is a 82 y/o female who presents from [facility] for elective R TKA on 04/24/2022. She is WBAT on the RLE. PMH significant for A-fib, L frozen shoulder, HTN, lateral epicondylitis, lupus, multiple lesions on CT of brain and spine, OA, spinal spondylosis.   Clinical Impression  Pt admitted with above diagnosis. Pt currently with functional limitations due to the deficits listed below (see PT Problem List). At the time of PT eval pt required increased time and assist for all aspects of functional mobility attempts. Noted pt with difficulty keeping eyes open, slurring speech, and reports feeling as if she would pass out. BP readings 408'X-448'J systolically throughout session. Deferred transfer OOB to chair as pt appears pre-syncopal with mobility. RN notified. Pt appears frustrated throughout session, reporting hypersensitivity to sound and discomfort with hospital noise. Pt declined therapeutic exercise, as she was focused on standing and getting OOB. Pt will benefit from skilled PT to increase their independence and safety with mobility to allow discharge to the venue listed below.          Recommendations for follow up therapy are one component of a multi-disciplinary discharge planning process, led by the attending physician.  Recommendations may be updated based on patient status, additional functional criteria and insurance authorization.  Follow Up Recommendations Follow physician's recommendations for discharge plan and follow up therapies Can patient physically be transported by private vehicle: No    Assistance Recommended at Discharge Frequent or constant Supervision/Assistance  Patient can return home with the following  A lot of help with bathing/dressing/bathroom;Assistance with cooking/housework;Two people to help  with walking and/or transfers;Assist for transportation    Equipment Recommendations Other (comment) (TBD by next venue of care)  Recommendations for Other Services       Functional Status Assessment Patient has had a recent decline in their functional status and demonstrates the ability to make significant improvements in function in a reasonable and predictable amount of time.     Precautions / Restrictions Precautions Precautions: Fall Restrictions Weight Bearing Restrictions: Yes      Mobility  Bed Mobility Overal bed mobility: Needs Assistance Bed Mobility: Supine to Sit, Sit to Supine     Supine to sit: Max assist Sit to supine: Max assist   General bed mobility comments: Assist for advancing LE's towards EOB and scooting out fully to get feet on the floor. Pt required increased time to gain sitting balance while EOB. When returning to supine, max assist provided for LE elevation back up into bed and for repositioning.    Transfers Overall transfer level: Needs assistance Equipment used: Rolling walker (2 wheels) Transfers: Sit to/from Stand Sit to Stand: Max assist, From elevated surface           General transfer comment: Pt hyperfocused on standing at EOB due to reported discomfort in hip (not knee) while sitting. Pt initiating standing attempts but unsuccessful without max assist. Noted pt's eyes closing, slurred speech and pt reports feeling "close to passing out". BP checked multiple times throughout sitting and standing EOB and stable throughout. Systolically, BP 856'D to 150's throughout session. She was able to take 2-3 small side steps at EOB before pt was returned to supine.    Ambulation/Gait               General Gait Details: Unable to tolerate ambulation attempts  Stairs            Wheelchair Mobility    Modified Rankin (Stroke Patients Only)       Balance                                              Pertinent Vitals/Pain Pain Assessment Pain Assessment: Faces Faces Pain Scale: Hurts little more Pain Location: R knee    Home Living Family/patient expects to be discharged to:: Skilled nursing facility                   Additional Comments: Pt from [place] with her husband. She plans to go to SNF level rehab at [facility] prior to returning to her home with husband's support.    Prior Function Prior Level of Function : Independent/Modified Independent                     Hand Dominance        Extremity/Trunk Assessment   Upper Extremity Assessment Upper Extremity Assessment: Defer to OT evaluation    Lower Extremity Assessment Lower Extremity Assessment: RLE deficits/detail RLE Deficits / Details: Acute pain, decreased strength and AROM consistent with above mentioned surgery.    Cervical / Trunk Assessment Cervical / Trunk Assessment: Other exceptions Cervical / Trunk Exceptions: Forward head posture with rounded shoulders.  Communication   Communication: No difficulties  Cognition Arousal/Alertness: Awake/alert Behavior During Therapy: WFL for tasks assessed/performed Overall Cognitive Status: Impaired/Different from baseline Area of Impairment: Attention, Memory, Following commands, Safety/judgement, Awareness, Problem solving                   Current Attention Level: Sustained Memory: Decreased short-term memory, Decreased recall of precautions Following Commands: Follows one step commands with increased time, Follows multi-step commands inconsistently Safety/Judgement: Decreased awareness of safety, Decreased awareness of deficits Awareness: Emergent Problem Solving: Slow processing, Decreased initiation, Requires verbal cues          General Comments      Exercises Total Joint Exercises Ankle Circles/Pumps: 10 reps Quad Sets:  (Attempted - Pt declined to try) Heel Slides: Other (comment) (Attempted -  pt declined to try)   Assessment/Plan    PT Assessment Patient needs continued PT services  PT Problem List Decreased strength;Decreased range of motion;Decreased activity tolerance;Decreased balance;Decreased mobility;Decreased knowledge of use of DME;Decreased safety awareness;Decreased knowledge of precautions;Decreased cognition;Pain       PT Treatment Interventions DME instruction;Gait training;Stair training;Functional mobility training;Therapeutic exercise;Therapeutic activities;Balance training;Patient/family education    PT Goals (Current goals can be found in the Care Plan section)  Acute Rehab PT Goals Patient Stated Goal: Be able to get OOB PT Goal Formulation: With patient Time For Goal Achievement: 05/09/22 Potential to Achieve Goals: Good    Frequency 7X/week     Co-evaluation               AM-PAC PT "6 Clicks" Mobility  Outcome Measure Help needed turning from your back to your side while in a flat bed without using bedrails?: A Lot Help needed moving from lying on your back to sitting on the side of a flat bed without using bedrails?: A Lot Help needed moving to and from a bed to a chair (including a wheelchair)?: A Lot Help needed standing up from a chair using your arms (e.g., wheelchair or bedside  chair)?: Total Help needed to walk in hospital room?: Total Help needed climbing 3-5 steps with a railing? : Total 6 Click Score: 9    End of Session Equipment Utilized During Treatment: Gait belt Activity Tolerance: Treatment limited secondary to medical complications (Comment) (Pt report of feeling as if she will pass out) Patient left: in bed;with call bell/phone within reach;with bed alarm set Nurse Communication: Mobility status PT Visit Diagnosis: Unsteadiness on feet (R26.81);Pain Pain - Right/Left: Right Pain - part of body: Knee    Time: 6720-9198 PT Time Calculation (min) (ACUTE ONLY): 49 min   Charges:   PT Evaluation $PT Eval Moderate  Complexity: 1 Mod PT Treatments $Gait Training: 8-22 mins $Therapeutic Activity: 8-22 mins        Rolinda Roan, PT, DPT Acute Rehabilitation Services Secure Chat Preferred Office: [phone]   Thelma Comp 04/25/2022, 1:22 PM

## 2022-04-25 NOTE — Progress Notes (Signed)
Subjective: 1 Day Post-Op Procedure(s) (LRB): RIGHT TOTAL KNEE ARTHROPLASTY (Right) Patient reports pain as moderate.    Objective: Vital signs in last 24 hours: Temp:  [97.7 F (36.5 C)-98.4 F (36.9 C)] 98.4 F (36.9 C) (01/03 0358) Pulse Rate:  [54-82] 82 (01/03 0358) Resp:  [10-19] 17 (01/03 0358) BP: (94-147)/(46-105) 135/67 (01/03 0358) SpO2:  [96 %-100 %] 96 % (01/03 0358) Weight:  [96.2 kg] 96.2 kg (01/02 1202)  Intake/Output from previous day: 01/02 0701 - 01/03 0700 In: -  Out: 805 [Urine:750; Emesis/NG output:30; Blood:25] Intake/Output this shift: No intake/output data recorded.  No results for input(s): "HGB" in the last 72 hours. No results for input(s): "WBC", "RBC", "HCT", "PLT" in the last 72 hours. No results for input(s): "NA", "K", "CL", "CO2", "BUN", "CREATININE", "GLUCOSE", "CALCIUM" in the last 72 hours. No results for input(s): "LABPT", "INR" in the last 72 hours.  Sensation intact distally Intact pulses distally Dorsiflexion/Plantar flexion intact Incision: dressing C/D/I Compartment soft   Assessment/Plan: 1 Day Post-Op Procedure(s) (LRB): RIGHT TOTAL KNEE ARTHROPLASTY (Right) Up with therapy  She is a resident of [facility], so will need their short-term SNF once discharged from here.  Will consult the Transitional Care Team    Mcarthur Rossetti 04/25/2022, 7:32 AM

## 2022-04-25 NOTE — Plan of Care (Signed)
  Problem: Education: Goal: Knowledge of General Education information will improve Description: Including pain rating scale, medication(s)/side effects and non-pharmacologic comfort measures Outcome: Not Progressing   Problem: Health Behavior/Discharge Planning: Goal: Ability to manage health-related needs will improve Outcome: Not Progressing   Problem: Clinical Measurements: Goal: Ability to maintain clinical measurements within normal limits will improve Outcome: Not Progressing Goal: Will remain free from infection Outcome: Not Progressing Goal: Diagnostic test results will improve Outcome: Not Progressing Goal: Respiratory complications will improve Outcome: Not Progressing Goal: Cardiovascular complication will be avoided Outcome: Not Progressing   Problem: Safety: Goal: Ability to remain free from injury will improve Outcome: Not Progressing

## 2022-04-25 NOTE — Progress Notes (Incomplete)
MD, patient is requesting her home med Hydroxychloroquine 200 mg tab to restart if she is to remain here another night.  Thank you.

## 2022-04-25 NOTE — Progress Notes (Signed)
Patient has been complaining that she is not able to get any sleep with the iv pole dripping.  She has one more abx iv due at 0200.  She is allowing me to keep iv going until that antibiotic goes in.  Patient expects staff to be in the room at a exact time when she calls.  This nurse explained that I try to move as fast as I can but I can not promise to be in the room EXACTLY when she expects.

## 2022-04-26 ENCOUNTER — Telehealth: Payer: Self-pay | Admitting: Orthopaedic Surgery

## 2022-04-26 DIAGNOSIS — E669 Obesity, unspecified: Secondary | ICD-10-CM | POA: Diagnosis present

## 2022-04-26 DIAGNOSIS — E871 Hypo-osmolality and hyponatremia: Secondary | ICD-10-CM | POA: Diagnosis present

## 2022-04-26 DIAGNOSIS — Z8616 Personal history of COVID-19: Secondary | ICD-10-CM | POA: Diagnosis not present

## 2022-04-26 DIAGNOSIS — Z800 Family history of malignant neoplasm of digestive organs: Secondary | ICD-10-CM | POA: Diagnosis not present

## 2022-04-26 DIAGNOSIS — Z888 Allergy status to other drugs, medicaments and biological substances status: Secondary | ICD-10-CM | POA: Diagnosis not present

## 2022-04-26 DIAGNOSIS — I2510 Atherosclerotic heart disease of native coronary artery without angina pectoris: Secondary | ICD-10-CM | POA: Diagnosis present

## 2022-04-26 DIAGNOSIS — I4892 Unspecified atrial flutter: Secondary | ICD-10-CM | POA: Diagnosis present

## 2022-04-26 DIAGNOSIS — E785 Hyperlipidemia, unspecified: Secondary | ICD-10-CM | POA: Diagnosis present

## 2022-04-26 DIAGNOSIS — Z823 Family history of stroke: Secondary | ICD-10-CM | POA: Diagnosis not present

## 2022-04-26 DIAGNOSIS — Z6836 Body mass index (BMI) 36.0-36.9, adult: Secondary | ICD-10-CM | POA: Diagnosis not present

## 2022-04-26 DIAGNOSIS — Z8249 Family history of ischemic heart disease and other diseases of the circulatory system: Secondary | ICD-10-CM | POA: Diagnosis not present

## 2022-04-26 DIAGNOSIS — I4819 Other persistent atrial fibrillation: Secondary | ICD-10-CM | POA: Diagnosis present

## 2022-04-26 DIAGNOSIS — Z825 Family history of asthma and other chronic lower respiratory diseases: Secondary | ICD-10-CM | POA: Diagnosis not present

## 2022-04-26 DIAGNOSIS — Z79899 Other long term (current) drug therapy: Secondary | ICD-10-CM | POA: Diagnosis not present

## 2022-04-26 DIAGNOSIS — I10 Essential (primary) hypertension: Secondary | ICD-10-CM | POA: Diagnosis present

## 2022-04-26 DIAGNOSIS — Z8661 Personal history of infections of the central nervous system: Secondary | ICD-10-CM | POA: Diagnosis not present

## 2022-04-26 DIAGNOSIS — M1711 Unilateral primary osteoarthritis, right knee: Secondary | ICD-10-CM | POA: Diagnosis present

## 2022-04-26 DIAGNOSIS — M329 Systemic lupus erythematosus, unspecified: Secondary | ICD-10-CM | POA: Diagnosis present

## 2022-04-26 LAB — BASIC METABOLIC PANEL
Anion gap: 11 (ref 5–15)
BUN: 11 mg/dL (ref 8–23)
CO2: 32 mmol/L (ref 22–32)
Calcium: 8 mg/dL — ABNORMAL LOW (ref 8.9–10.3)
Chloride: 92 mmol/L — ABNORMAL LOW (ref 98–111)
Creatinine, Ser: 0.75 mg/dL (ref 0.44–1.00)
GFR, Estimated: >60 mL/min (ref >60)
Glucose, Bld: 113 mg/dL — ABNORMAL HIGH (ref 70–99)
Potassium: 2.6 mmol/L — CL (ref 3.5–5.1)
Sodium: 135 mmol/L (ref 135–145)

## 2022-04-26 LAB — MAGNESIUM: Magnesium: 2 mg/dL (ref 1.7–2.4)

## 2022-04-26 MED ORDER — POTASSIUM CHLORIDE CRYS ER 20 MEQ PO TBCR
40.0000 meq | EXTENDED_RELEASE_TABLET | Freq: Two times a day (BID) | ORAL | Status: DC
  Administered 2022-04-26 – 2022-04-27 (×3): 40 meq via ORAL
  Filled 2022-04-26 (×3): qty 2

## 2022-04-26 NOTE — Progress Notes (Signed)
Subjective: 2 Days Post-Op Procedure(s) (LRB): RIGHT TOTAL KNEE ARTHROPLASTY (Right) Patient reports pain as severe.  States she cannot move her right leg . Concerns over history of encephalopathy and this being the cause of her decreased leg motion.   Objective: Vital signs in last 24 hours: Temp:  [97.5 F (36.4 C)-99.9 F (37.7 C)] 99.9 F (37.7 C) (01/04 0414) Pulse Rate:  [85-93] 85 (01/04 0705) Resp:  [16-17] 16 (01/04 0705) BP: (122-148)/(57-64) 132/63 (01/04 0705) SpO2:  [89 %-90 %] 90 % (01/04 0705)  Intake/Output from previous day: 01/03 0701 - 01/04 0700 In: 1804.5 [P.O.:120; I.V.:1684.5] Out: -  Intake/Output this shift: No intake/output data recorded.  No results for input(s): "HGB" in the last 72 hours. No results for input(s): "WBC", "RBC", "HCT", "PLT" in the last 72 hours. Recent Labs    04/25/22 0628 04/26/22 0852  NA 138 135  K 2.9* 2.6*  CL 97* 92*  CO2 29 32  BUN 11 11  CREATININE 0.56 0.75  GLUCOSE 127* 113*  CALCIUM 8.1* 8.0*   No results for input(s): "LABPT", "INR" in the last 72 hours.  Dorsiflexion/Plantar flexion intact Incision: scant drainage Compartment soft   Assessment/Plan: 2 Days Post-Op Procedure(s) (LRB): RIGHT TOTAL KNEE ARTHROPLASTY (Right) Up with therapy with knee immobilizer on when out of bed. Quad weakness secondary to total knee replacement should resolve with time and exercise. Reassurance given to patient.  Plan discharge to [facility].  Hyponatremia K-dur 40 mEq Bid being given . Check again in AM.       [place] 04/26/2022, 12:09 PM

## 2022-04-26 NOTE — NC FL2 (Signed)
[place] LEVEL OF CARE FORM     IDENTIFICATION  Patient Name: Lindsay Clark Birthdate: 1940/06/04 Sex: female Admission Date (Current Location): 04/24/2022  [facility] and [place] Number:  Herbalist and Address:  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/24/2022   Acquired genu valgum 10/05/2021   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   Left knee pain 08/09/2020   Persistent atrial fibrillation ([place]) 04/05/2020   Secondary hypercoagulable state ([place]) 04/05/2020   Primary osteoarthritis of left knee 09/02/2019   Prolapsed internal hemorrhoids, grade 2 03/03/2019   Brain lesion 06/24/2017   Osteoarthritis    Multiple lesions on CT of brain and spine    Movement disorder    Lumbar spondylosis    Hyperlipidemia    DDD (degenerative disc disease), lumbar    Dermatitis    Cervical spondylosis    Cervical disc disease    Diastolic dysfunction 62/37/6283   Syncope 04/18/2016   Anxiety 04/18/2016   Hypertension    Autoimmune disorder-autoimmune encephalitis    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   Coronary artery calcification of native artery 11/04/2014   Abnormal finding on MRI of brain 10/04/2014   Pruritus 15/17/6160   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    Orientation RESPIRATION BLADDER Height & Weight     Self, Time, Situation, Place  Normal Continent Weight: 212 lb (96.2 kg) Height:  '5\' 4"'$  (162.6 cm)  BEHAVIORAL  SYMPTOMS/MOOD NEUROLOGICAL BOWEL NUTRITION STATUS      Continent Diet (see discharge summary)  AMBULATORY STATUS COMMUNICATION OF NEEDS Skin   Total Care Verbally Surgical wounds                       Personal Care Assistance Level of Assistance  Bathing, Feeding, Dressing Bathing Assistance: Maximum assistance Feeding assistance: Independent Dressing Assistance: Maximum assistance     Functional Limitations Info  Sight, Hearing, Speech Sight Info: Adequate Hearing Info: Adequate Speech Info: Adequate    SPECIAL CARE FACTORS FREQUENCY  PT (By licensed PT), OT (By licensed OT)     PT Frequency: 5x week OT Frequency: 5x week            Contractures Contractures Info: Not present    Additional Factors Info  Code Status, Allergies Code Status Info: full Allergies Info: Amlodipine, Diltiazem, Methotrexate, Metronidazole, Other, Quaternium-15           Current Medications (04/26/2022):  This is the current hospital active medication list Current Facility-Administered Medications  Medication Dose Route Frequency Provider Last Rate Last Admin   0.9 %  sodium chloride infusion   Intravenous Continuous Mcarthur Rossetti, MD 75 mL/hr at 04/26/22 0526 New Bag at 04/26/22 0526   acetaminophen (TYLENOL) tablet 325-650 mg  325-650 mg Oral Q6H PRN Mcarthur Rossetti, MD       alum & mag hydroxide-simeth (MAALOX/MYLANTA) 200-200-20 MG/5ML suspension 30 mL  30 mL Oral Q4H PRN Mcarthur Rossetti, MD  apixaban (ELIQUIS) tablet 5 mg  5 mg Oral BID Mcarthur Rossetti, MD   5 mg at 04/26/22 7793   cholecalciferol (VITAMIN D3) 25 MCG (1000 UNIT) tablet 2,000 Units  2,000 Units Oral TID WC Mcarthur Rossetti, MD   2,000 Units at 04/26/22 1112   diphenhydrAMINE (BENADRYL) 12.5 MG/5ML elixir 12.5-25 mg  12.5-25 mg Oral Q4H PRN Mcarthur Rossetti, MD       docusate sodium (COLACE) capsule 100 mg  100 mg Oral BID Mcarthur Rossetti, MD   100 mg at  04/26/22 9030   furosemide (LASIX) tablet 80 mg  80 mg Oral Daily Mcarthur Rossetti, MD   80 mg at 04/26/22 0923   HYDROmorphone (DILAUDID) injection 0.5-1 mg  0.5-1 mg Intravenous Q4H PRN Mcarthur Rossetti, MD   1 mg at 04/25/22 1906   HYDROmorphone (DILAUDID) tablet 2-3 mg  2-3 mg Oral Q4H PRN Mcarthur Rossetti, MD   2 mg at 04/26/22 0421   menthol-cetylpyridinium (CEPACOL) lozenge 3 mg  1 lozenge Oral PRN Mcarthur Rossetti, MD       Or   phenol (CHLORASEPTIC) mouth spray 1 spray  1 spray Mouth/Throat PRN Mcarthur Rossetti, MD       methocarbamol (ROBAXIN) tablet 500 mg  500 mg Oral Q6H PRN Mcarthur Rossetti, MD   500 mg at 04/26/22 0421   Or   methocarbamol (ROBAXIN) 500 mg in dextrose 5 % 50 mL IVPB  500 mg Intravenous Q6H PRN Mcarthur Rossetti, MD       metoCLOPramide (REGLAN) tablet 5-10 mg  5-10 mg Oral Q8H PRN Mcarthur Rossetti, MD       Or   metoCLOPramide (REGLAN) injection 5-10 mg  5-10 mg Intravenous Q8H PRN Mcarthur Rossetti, MD       metoprolol succinate (TOPROL-XL) 24 hr tablet 25 mg  25 mg Oral Daily Mcarthur Rossetti, MD   25 mg at 04/26/22 3007   multivitamin with minerals tablet 1 tablet  1 tablet Oral Daily Mcarthur Rossetti, MD   1 tablet at 04/26/22 0952   ondansetron (ZOFRAN) tablet 4 mg  4 mg Oral Q6H PRN Mcarthur Rossetti, MD       Or   ondansetron [facility]) injection 4 mg  4 mg Intravenous Q6H PRN Mcarthur Rossetti, MD   4 mg at 04/25/22 0251   oxyCODONE (Oxy IR/ROXICODONE) immediate release tablet 5-10 mg  5-10 mg Oral Q4H PRN Mcarthur Rossetti, MD   10 mg at 04/25/22 1540   pantoprazole (PROTONIX) EC tablet 40 mg  40 mg Oral Daily Mcarthur Rossetti, MD   40 mg at 04/26/22 6226   polyethylene glycol (MIRALAX / GLYCOLAX) packet 17 g  17 g Oral Daily PRN Mcarthur Rossetti, MD       potassium chloride SA (KLOR-CON M) CR tablet 20 mEq  20 mEq Oral Daily Mcarthur Rossetti,  MD   20 mEq at 04/26/22 3335   potassium chloride SA (KLOR-CON M) CR tablet 40 mEq  40 mEq Oral BID Mcarthur Rossetti, MD   40 mEq at 04/26/22 1112     Discharge Medications: Please see discharge summary for a list of discharge medications.  Relevant Imaging Results:  Relevant Lab Results:   Additional Information SSN: [id number]  Joanne Chars, LCSW

## 2022-04-26 NOTE — Care Management Obs Status (Signed)
[place] NOTIFICATION   Patient Details  Name: Lindsay Clark MRN: [id number] Date of Birth: 1940/10/31   Medicare Observation Status Notification Given:  Yes    Tom-Johnson, Renea Ee, RN 04/26/2022, 9:04 AM

## 2022-04-26 NOTE — Progress Notes (Signed)
Physical Therapy Treatment Patient Details Name: Lindsay Clark MRN: [id number] DOB: 09-30-1940 Today's Date: 04/26/2022   History of Present Illness Pt is a 82 y/o female who presents from [place] independent living for elective R TKA on 04/24/2022. She is WBAT on the RLE. PMH significant for A-fib, L frozen shoulder, HTN, lateral epicondylitis, lupus, multiple lesions on CT of brain and spine, OA, spinal spondylosis.    PT Comments    Pt received in recliner, c/o fatigue after sitting up ~1hr and premedication by RN, agreeable to second PT session with spouse present and receptive to instruction on assisting pt with her HEP and for guarding positions. Pt spouse uses cane and has hx of back surgery, so may not be able to assist heavily for lifting her up. Emphasis on transfer safety, step sequencing with RW and RLE exercises, pt with slightly improved RLE activation and trace quad contraction during quad sets, encouraged her to continue HEP TID with assist from spouse PRN. Plan to bring gait belt next session and further work on gait with chair follow and KI next session. Pt continues to benefit from PT services to progress toward functional mobility goals.   Recommendations for follow up therapy are one component of a multi-disciplinary discharge planning process, led by the attending physician.  Recommendations may be updated based on patient status, additional functional criteria and insurance authorization.  Follow Up Recommendations  Follow physician's recommendations for discharge plan and follow up therapies Can patient physically be transported by private vehicle: No   Assistance Recommended at Discharge Frequent or constant Supervision/Assistance  Patient can return home with the following A lot of help with bathing/dressing/bathroom;Assistance with cooking/housework;Two people to help with walking and/or transfers;Assist for transportation   Equipment Recommendations  Other (comment)  (TBD by next venue of care)    Recommendations for Other Services       Precautions / Restrictions Precautions Precautions: Fall Required Braces or Orthoses: Knee Immobilizer - Right Knee Immobilizer - Right: Discontinue once straight leg raise with < 10 degree lag;On when out of bed or walking Restrictions Weight Bearing Restrictions: Yes RLE Weight Bearing: Weight bearing as tolerated     Mobility  Bed Mobility Overal bed mobility: Needs Assistance Bed Mobility: Sit to Supine       Sit to supine: Mod assist, +2 for safety/equipment   General bed mobility comments: cues for self-assist with gait belt as leg lifter and RLE assist ultimately needed in addition to min guard at shoulders to guide safely into position.    Transfers Overall transfer level: Needs assistance Equipment used: Rolling walker (2 wheels) Transfers: Sit to/from Stand, Bed to chair/wheelchair/BSC Sit to Stand: From elevated surface, +2 safety/equipment, Mod assist   Step pivot transfers: From elevated surface, Mod assist, +2 physical assistance       General transfer comment: Pt with poor command following for RW management and difficulty offloading LLE for steps, pt with KI donned throughout but states she does not trust RLE, pt did better with manual assist for weight shifting and advancing each LE but able to ab/aDduct her RLE better in second session when pivoting. Dense multimodal cues throughout, pt pivoted ~51f from chair>bed, too fatigued to perform additional trials.    Ambulation/Gait               General Gait Details: Unable to tolerate ambulation attempts >563f  StChief Strategy Officer  Modified Rankin (Stroke Patients Only)       Balance Overall balance assessment: Needs assistance Sitting-balance support: Single extremity supported Sitting balance-Leahy Scale: Fair Sitting balance - Comments: improved upright sitting in chair Postural control:  Posterior lean Standing balance support: Bilateral upper extremity supported, Reliant on assistive device for balance Standing balance-Leahy Scale: Poor Standing balance comment: mod to maxA with RW for dynamic standing tasks                            Cognition Arousal/Alertness: Awake/alert Behavior During Therapy: Flat affect, Impulsive Overall Cognitive Status: Impaired/Different from baseline Area of Impairment: Attention, Memory, Following commands, Safety/judgement, Awareness, Problem solving                   Current Attention Level: Sustained Memory: Decreased short-term memory, Decreased recall of precautions Following Commands: Follows one step commands with increased time, Follows multi-step commands inconsistently, Follows one step commands inconsistently Safety/Judgement: Decreased awareness of safety, Decreased awareness of deficits Awareness: Emergent Problem Solving: Slow processing, Requires verbal cues, Difficulty sequencing, Requires tactile cues General Comments: Pt cooperative today with firm instructions, somewhat impulsive with poor safety awareness.        Exercises Total Joint Exercises Ankle Circles/Pumps: AROM, Both, 20 reps, Supine Quad Sets: AROM, AAROM, Supine, 5 reps (tactile cues throughout, some R quad contraction, pt did better BLE with co-contraction) Gluteal Sets:  (cued to attempt hourly) Short Arc Quad: AROM, AAROM, Both, 5 reps, Supine, Other (comment) (pt performed on LLE for teachback ~3 reps, then with AAROM on RLE, small quad contraction but unable to achieve TKE without assist) Heel Slides: Other (comment), AAROM, 10 reps, Supine, Right (ROM on RLE limited due to pain; on LLE a couple reps as well for teachback initially) Hip ABduction/ADduction: AAROM, 10 reps, Supine, Right Straight Leg Raises: PROM, Right, Other (comment) (a few reps while repositioning, pt with quad extensor lag, needs heavy AA to PROM) Long Arc Quad:   (pt defers due to fatigue after pivoting) Goniometric ROM: R knee ROM grossly 5 to 40* in supine    General Comments General comments (skin integrity, edema, etc.): 139/47 (69) taken prior to standing, consistent wtih reading taken in previous session at rest; no c/o nausea in PM session when standing      Pertinent Vitals/Pain Pain Assessment Pain Assessment: Faces Faces Pain Scale: Hurts little more Pain Location: R knee, worse on anterior aspect Pain Descriptors / Indicators: Grimacing, Operative site guarding, Sore Pain Intervention(s): Monitored during session, Repositioned, Premedicated before session, Ice applied    Home Living                          Prior Function            PT Goals (current goals can now be found in the care plan section) Acute Rehab PT Goals Patient Stated Goal: Be able to get OOB and move my RLE better PT Goal Formulation: With patient Time For Goal Achievement: 05/09/22 Progress towards PT goals: Progressing toward goals    Frequency    7X/week      PT Plan Current plan remains appropriate    Co-evaluation              AM-PAC PT "6 Clicks" Mobility   Outcome Measure  Help needed turning from your back to your side while in a flat bed without using bedrails?: A Lot Help  needed moving from lying on your back to sitting on the side of a flat bed without using bedrails?: A Lot Help needed moving to and from a bed to a chair (including a wheelchair)?: A Lot Help needed standing up from a chair using your arms (e.g., wheelchair or bedside chair)?: A Lot Help needed to walk in hospital room?: Total (<84f) Help needed climbing 3-5 steps with a railing? : Total 6 Click Score: 10    End of Session Equipment Utilized During Treatment: Gait belt;Right knee immobilizer Activity Tolerance: Patient tolerated treatment well;Patient limited by fatigue Patient left: with call bell/phone within reach;Other (comment);with  family/visitor present;in bed;with bed alarm set;with nursing/sitter in room (iceman on with wash cloth buffer to prevent it getting too cold, spouse present) Nurse Communication: Mobility status;Precautions;Other (comment) (+2 safety to pivot, could try Stedy if pt having more difficulty) PT Visit Diagnosis: Unsteadiness on feet (R26.81);Pain Pain - Right/Left: Right Pain - part of body: Knee     Time: 1938[address]-0175PT Time Calculation (min) (ACUTE ONLY): 18 min  Charges:   $Therapeutic Activity: 8-22 mins                     Blessed Cotham P., PTA Acute Rehabilitation Services Secure Chat Preferred 9a-5:30pm Office: [place]1/07/2022, 2:31 PM

## 2022-04-26 NOTE — TOC Initial Note (Signed)
Transition of Care [facility]) - Initial/Assessment Note    Patient Details  Name: Lindsay Clark MRN: [id number] Date of Birth: 08-17-1940  Transition [facility]) CM/SW Contact:    Joanne Chars, LCSW Phone Number: 04/26/2022, 11:20 AM  Clinical Narrative:     CSW met with pt regarding DC plan.  Pt confirms she is resident at [facility], lives independent living with husband, Annie Main, permission given to speak with him.  Pt does plan to return to SNF at [facility] spoke wit Donna/[facility] and confirmed that they can receive pt to SNF.                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: Materials engineer Environmental education officer) Lives with:: Spouse Patient language and need for interpreter reviewed:: Yes Do you feel safe going back to the place where you live?: Yes      Need for Family Participation in Patient Care: Yes (Comment) Care giver support system in place?: Yes (comment) Current home services: Other (comment) (none) Criminal Activity/Legal Involvement Pertinent to Current Situation/Hospitalization: No - Comment as needed  Activities of Daily Living Home Assistive Devices/Equipment: Walker (specify type), Raised toilet seat with rails ADL Screening (condition at time of admission) Patient's cognitive ability adequate to safely complete daily activities?:  Yes Is the patient deaf or have difficulty hearing?: No Does the patient have difficulty seeing, even when wearing glasses/contacts?: No Does the patient have difficulty concentrating, remembering, or making decisions?: No Patient able to express need for assistance with ADLs?: Yes Does the patient have difficulty dressing or bathing?: No Independently performs ADLs?: Yes (appropriate for developmental age) Does the patient have difficulty walking or climbing stairs?: Yes Weakness of Legs: Left Weakness of Arms/Hands: None  Permission Sought/Granted Permission sought to share information with : Family Supports Permission granted to share information with : Yes, Verbal Permission Granted  Share Information with NAME: husband Annie Main           Emotional Assessment Appearance:: Appears stated age Attitude/Demeanor/Rapport: Engaged Affect (typically observed): Appropriate, Pleasant Orientation: : Oriented to Self, Oriented to Place, Oriented to  Time, Oriented to Situation      Admission diagnosis:  Status post total right knee replacement [Z96.651] Patient Active Problem List   Diagnosis Date Noted   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/24/2022   Acquired genu valgum 10/05/2021   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   Left knee pain 08/09/2020   Persistent atrial fibrillation ([place]) 04/05/2020   Secondary hypercoagulable state ([place]) 04/05/2020   Primary osteoarthritis of left knee 09/02/2019   Prolapsed internal hemorrhoids, grade 2 03/03/2019   Brain lesion 06/24/2017   Osteoarthritis    Multiple lesions on CT of brain and spine    Movement disorder  Lumbar spondylosis    Hyperlipidemia    DDD (degenerative disc disease), lumbar    Dermatitis    Cervical spondylosis    Cervical disc disease    Diastolic dysfunction 75/43/6067   Syncope 04/18/2016   Anxiety 04/18/2016   Hypertension    Autoimmune disorder-autoimmune encephalitis    Focal  dystonia 03/16/2015   Coronary artery calcification of native artery 11/04/2014   Abnormal finding on MRI of brain 10/04/2014   Pruritus 70/34/0352   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   PCP:  [name], MD Pharmacy:   [place], [place] [address] Phone: [phone] Fax: [phone]     Social Determinants of Health (SDOH) Social History: SDOH Screenings   Food Insecurity: No Food Insecurity (04/24/2022)  Housing: Low Risk  (04/24/2022)  Transportation Needs: No Transportation Needs (04/24/2022)  Utilities: Not At Risk (04/24/2022)  Depression (PHQ2-9): Low Risk  (10/05/2021)  Tobacco Use: Low Risk  (04/24/2022)   SDOH Interventions:     Readmission Risk Interventions     No data to display

## 2022-04-26 NOTE — Progress Notes (Signed)
Knee ice machine applied to patient, right leg straightened in bed and blanket folded and placed under right ankle. Patient educated on importance of keeping leg straight post op

## 2022-04-26 NOTE — Progress Notes (Addendum)
Mobility Specialist Progress Note   04/26/22 1155  Mobility  Activity Transferred to/from [facility]  Level of Assistance +2 (takes two people) (ModA)  Geographical information systems officer  Distance Ambulated (ft) 2 ft  Activity Response Tolerated fair  $Mobility charge 1 Mobility   NT requesting assistance to get pt from bed to [facility]. Pt  requiring +2A ModA to EOB d/t decreased pain tolerance and mobility in RLE and limited core strength in trunk. Able to stand from an elevated surface w/ same assistance and pivot to [facility] but requiring verbal and tactile cues for foot and hand placement. Left on [facility] w/ PA present in room.      [place] Falling Mobility Specialist Please contact via SecureChat or  Rehab office at [phone]

## 2022-04-26 NOTE — Telephone Encounter (Signed)
Patient states she is in hospital and wants Dr. Ninfa Linden to send a Neurologist to check her..please advise..[phone]

## 2022-04-26 NOTE — Progress Notes (Signed)
Physical Therapy Treatment Patient Details Name: Lindsay Clark MRN: [id number] DOB: [birth year] Today's Date: 04/26/2022   History of Present Illness Pt is a 82 y/o female who presents from [place] independent living for elective R TKA on 04/24/2022. She is WBAT on the RLE. PMH significant for A-fib, L frozen shoulder, HTN, lateral epicondylitis, lupus, multiple lesions on CT of brain and spine, OA, spinal spondylosis.    PT Comments    Pt received seated on BSC, agreeable to therapy session with emphasis on transfer safety, RW use, KI on for OOB, iceman for pain/edema relief and LE HEP per handout. Pt needing up to maxA for transfers and weight shifting with RW support, pt tolerates <62f distances prior to fatigue and defers longer distance gait due to evolving nausea/pain/fatigue. Pt participating in HEP instruction this date with encouragement and given additional handout to reinforce. Pt may benefit from OT consult as well to build endurance/confidence and for transfer safety reinforcement. Pt continues to benefit from PT services to progress toward functional mobility goals.    Recommendations for follow up therapy are one component of a multi-disciplinary discharge planning process, led by the attending physician.  Recommendations may be updated based on patient status, additional functional criteria and insurance authorization.  Follow Up Recommendations  Follow physician's recommendations for discharge plan and follow up therapies Can patient physically be transported by private vehicle: No   Assistance Recommended at Discharge Frequent or constant Supervision/Assistance  Patient can return home with the following A lot of help with bathing/dressing/bathroom;Assistance with cooking/housework;Two people to help with walking and/or transfers;Assist for transportation   Equipment Recommendations  Other (comment) (TBD by next venue of care)    Recommendations for Other Services        Precautions / Restrictions Precautions Precautions: Fall Required Braces or Orthoses: Knee Immobilizer - Right Knee Immobilizer - Right: Discontinue once straight leg raise with < 10 degree lag;On when out of bed or walking Restrictions Weight Bearing Restrictions: Yes RLE Weight Bearing: Weight bearing as tolerated     Mobility  Bed Mobility Overal bed mobility: Needs Assistance             General bed mobility comments: pt received seated on BSC, PA leaving the room.    Transfers Overall transfer level: Needs assistance Equipment used: Rolling walker (2 wheels) Transfers: Sit to/from Stand, Bed to chair/wheelchair/BSC Sit to Stand: Max assist, From elevated surface, +2 safety/equipment   Step pivot transfers: From elevated surface, Max assist       General transfer comment: Pt with poor command following for RW management and difficulty offloading LLE for steps, pt with KI donned throughout but states she does not trust RLE, pt did better with manual assist for weight shifting and advancing each LE. Densure multimodal cues throughout, chair pulled closer behind her after pt took ~5 pivotal steps due to c/o fatigue, she denies lightheadedness but endorses nausea and RLE pain.    Ambulation/Gait               General Gait Details: Unable to tolerate ambulation attempts >562f  Stairs             Wheelchair Mobility    Modified Rankin (Stroke Patients Only)       Balance Overall balance assessment: Needs assistance Sitting-balance support: Single extremity supported Sitting balance-Leahy Scale: Poor Sitting balance - Comments: posterior bias, pt needs reminders to use UE support and to prevent LOB Postural control: Posterior lean Standing balance support:  Bilateral upper extremity supported, Reliant on assistive device for balance Standing balance-Leahy Scale: Poor Standing balance comment: mod to maxA with RW for dynamic standing tasks                             Cognition Arousal/Alertness: Awake/alert Behavior During Therapy: Flat affect, Impulsive Overall Cognitive Status: Impaired/Different from baseline Area of Impairment: Attention, Memory, Following commands, Safety/judgement, Awareness, Problem solving                   Current Attention Level: Sustained Memory: Decreased short-term memory, Decreased recall of precautions Following Commands: Follows one step commands with increased time, Follows multi-step commands inconsistently, Follows one step commands inconsistently Safety/Judgement: Decreased awareness of safety, Decreased awareness of deficits Awareness: Emergent Problem Solving: Slow processing, Requires verbal cues, Difficulty sequencing, Requires tactile cues General Comments: Pt following some commands but tending to ignore others, unclear if processing delay or just due to pt self-directed/poor insight. Pt cooperative today with firm instructions, somewhat impulsive with poor safety awareness.        Exercises Total Joint Exercises Ankle Circles/Pumps: AROM, Both, 20 reps, Supine Quad Sets: AROM, AAROM, 10 reps, Supine (tactile cues throughout, trace R quad contraction, pt did better BLE with co-contraction) Gluteal Sets:  (cued to attempt hourly) Short Arc Quad: AROM, AAROM, Both, 5 reps, Supine, Other (comment) (pt performed on LLE for teachback ~3 reps, then with AAROM on RLE, small quad contraction but unable to achieve TKE without assist) Heel Slides: Other (comment), AAROM, Both, 10 reps, Supine (ROM on RLE limited due to pain; on LLE as well for teachback initially) Hip ABduction/ADduction: AAROM, 10 reps, Supine, Right (improved activation compared with heel slides on her RLE) Straight Leg Raises: PROM, Right, Other (comment) (a few reps while repositioning, pt with quad extensor lag, needs heavy AA to PROM) Long Arc Quad:  (pt defers due to fatigue after pivoting) Goniometric ROM: R  knee ROM grossly 5 to 40* in supine    General Comments General comments (skin integrity, edema, etc.): BP 139/45 (69) reclined in chair post-exertion, pt c/o nausea/fatigue. I also reviewed some symptoms of low K+ with her as lethargy/tingling/numbness could be related to some of the concerns she had in AM, but I saw her ! 60 mins after K+ medicine given so these should be improving as potassium improves.      Pertinent Vitals/Pain Pain Assessment Pain Assessment: Faces Faces Pain Scale: Hurts little more Pain Location: R knee, worse on anterior aspect Pain Descriptors / Indicators: Grimacing, Operative site guarding, Sore Pain Intervention(s): Repositioned, Monitored during session, Premedicated before session, Ice applied (iceman applied)     PT Goals (current goals can now be found in the care plan section) Acute Rehab PT Goals Patient Stated Goal: Be able to get OOB and move my RLE better PT Goal Formulation: With patient Time For Goal Achievement: 05/09/22 Progress towards PT goals: Progressing toward goals    Frequency    7X/week      PT Plan Current plan remains appropriate       AM-PAC PT "6 Clicks" Mobility   Outcome Measure  Help needed turning from your back to your side while in a flat bed without using bedrails?: A Lot Help needed moving from lying on your back to sitting on the side of a flat bed without using bedrails?: A Lot Help needed moving to and from a bed to a chair (including a  wheelchair)?: A Lot Help needed standing up from a chair using your arms (e.g., wheelchair or bedside chair)?: Total (+2 maxA) Help needed to walk in hospital room?: Total Help needed climbing 3-5 steps with a railing? : Total 6 Click Score: 9    End of Session Equipment Utilized During Treatment: Gait belt;Right knee immobilizer Activity Tolerance: Patient tolerated treatment well;Patient limited by fatigue Patient left: with call bell/phone within reach;in chair;with  chair alarm set;Other (comment);with family/visitor present (iceman on, reclined, spouse entering the room a few mins post-session) Nurse Communication: Mobility status;Precautions;Other (comment) (+2 safety to pivot, could try Stedy if pt having more difficulty) PT Visit Diagnosis: Unsteadiness on feet (R26.81);Pain Pain - Right/Left: Right Pain - part of body: Knee     Time: 1210-1239 PT Time Calculation (min) (ACUTE ONLY): 29 min  Charges:  $Gait Training: 8-22 mins $Therapeutic Exercise: 8-22 mins                     Evertte Sones P., PTA Acute Rehabilitation Services Secure Chat Preferred 9a-5:30pm Office: [place] 04/26/2022, 1:07 PM

## 2022-04-26 NOTE — Progress Notes (Signed)
CRITICAL VALUE STICKER  CRITICAL VALUE: K+ 2.6  RECEIVER (on-site recipient of call):  DATE & TIME NOTIFIED: 04/26/22 @ 1100  MESSENGER (representative from lab): Tamiko   MD NOTIFIED: Ninfa Linden   TIME OF NOTIFICATION: 10:50   RESPONSE: K+ replacement ordered

## 2022-04-27 ENCOUNTER — Telehealth: Payer: Self-pay | Admitting: Orthopaedic Surgery

## 2022-04-27 LAB — BASIC METABOLIC PANEL
Anion gap: 8 (ref 5–15)
BUN: 13 mg/dL (ref 8–23)
CO2: 31 mmol/L (ref 22–32)
Calcium: 7.9 mg/dL — ABNORMAL LOW (ref 8.9–10.3)
Chloride: 99 mmol/L (ref 98–111)
Creatinine, Ser: 0.64 mg/dL (ref 0.44–1.00)
GFR, Estimated: >60 mL/min (ref >60)
Glucose, Bld: 118 mg/dL — ABNORMAL HIGH (ref 70–99)
Potassium: 3.6 mmol/L (ref 3.5–5.1)
Sodium: 138 mmol/L (ref 135–145)

## 2022-04-27 LAB — CBC
HCT: 31.6 % — ABNORMAL LOW (ref 36.0–46.0)
Hemoglobin: 10.5 g/dL — ABNORMAL LOW (ref 12.0–15.0)
MCH: 31.2 pg (ref 26.0–34.0)
MCHC: 33.2 g/dL (ref 30.0–36.0)
MCV: 93.8 fL (ref 80.0–100.0)
Platelets: 167 10*3/uL (ref 150–400)
RBC: 3.37 MIL/uL — ABNORMAL LOW (ref 3.87–5.11)
RDW: 13.8 % (ref 11.5–15.5)
WBC: 8.6 10*3/uL (ref 4.0–10.5)
nRBC: 0 % (ref 0.0–0.2)

## 2022-04-27 MED ORDER — METHOCARBAMOL 500 MG PO TABS: 500.0000 mg | ORAL_TABLET | Freq: Four times a day (QID) | ORAL | 0 refills | Status: DC | PRN

## 2022-04-27 MED ORDER — OXYCODONE HCL 5 MG PO TABS: 5.0000 mg | ORAL_TABLET | ORAL | 0 refills | Status: DC | PRN

## 2022-04-27 NOTE — Discharge Summary (Signed)
Patient ID: Lindsay Clark MRN: [id number] DOB/AGE: 29-May-1940 82 y.o.  Admit date: 04/24/2022 Discharge date: 04/27/2022  Admission Diagnoses:  Principal Problem:   Status post total right knee replacement   Discharge Diagnoses:  Same  Past Medical History:  Diagnosis Date   Atherosclerosis    mild cerebral   Atrial fibrillation and flutter (HCC)    Autoimmune disorder (HCC)    Autoimmune encephalitis    Cervical disc disease    with spondylosis   Cervical spondylosis    Chronic dermatitis    eczematous   Colitis    COVID 10/2020   DDD (degenerative disc disease), lumbar    Frozen shoulder    left   Hyperlipidemia    Hypertension    IBS (irritable bowel syndrome)    with diarrhea Pt stated she never had this   Lateral epicondylitis    Lumbar spondylosis    Lupus (HCC)    Movement disorder    Multiple lesions on CT of brain and spine    reports lesions from previous CT scan at [facility]    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   PONV (postoperative nausea and vomiting)    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   Torn meniscus    left    Surgeries: Procedure(s): RIGHT TOTAL KNEE ARTHROPLASTY on 04/24/2022   Consultants:   Discharged Condition: Improved  Hospital Course: Lindsay Clark is an 82 y.o. female who was admitted 04/24/2022 for operative treatment ofStatus post total right knee replacement. Patient has severe unremitting pain that affects sleep, daily activities, and work/hobbies. After pre-op clearance the patient was taken to the operating room on 04/24/2022 and underwent  Procedure(s): RIGHT TOTAL KNEE ARTHROPLASTY.    Patient was given perioperative antibiotics:  Anti-infectives (From admission, onward)    Start     Dose/Rate Route Frequency Ordered Stop   04/24/22 2000  ceFAZolin (ANCEF) IVPB 1 g/50 mL premix        1 g 100 mL/hr over 30 Minutes Intravenous Every 6 hours 04/24/22 1832 04/25/22 0236   04/24/22 1200  ceFAZolin (ANCEF) IVPB  2g/100 mL premix        2 g 200 mL/hr over 30 Minutes Intravenous On call to O.R. 04/24/22 1157 04/24/22 1342        Patient was given sequential compression devices, early ambulation, and chemoprophylaxis to prevent DVT.  Patient benefited maximally from hospital stay and there were no complications.    Recent vital signs: Patient Vitals for the past 24 hrs:  BP Temp Temp src Pulse Resp SpO2  04/27/22 0534 -- 98.6 F (37 C) Oral 80 17 96 %  04/26/22 1909 124/64 98.2 F (36.8 C) Oral 79 16 94 %  04/26/22 1503 (!) 110/40 (!) 97.4 F (36.3 C) Oral 78 -- 95 %  04/26/22 1318 (!) 130/47 -- -- -- -- --  04/26/22 1238 (!) 139/45 -- -- -- -- --  04/26/22 0705 132/63 -- -- 85 16 90 %     Recent laboratory studies:  Recent Labs    04/26/22 0852 04/27/22 0438  WBC  --  8.6  HGB  --  10.5*  HCT  --  31.6*  PLT  --  167  NA 135 138  K 2.6* 3.6  CL 92* 99  CO2 32 31  BUN 11 13  CREATININE 0.75 0.64  GLUCOSE 113* 118*  CALCIUM 8.0* 7.9*     Discharge Medications:   Allergies as of 04/27/2022  Reactions   Amlodipine Swelling    leg swelling   Diltiazem Swelling   Bilateral feet   Methotrexate Swelling    swelling in ankles   Metronidazole Swelling    leg swelling   Other Other (See Comments)   Medication:Methychloroisothlazolinone/methylisothiazilinoneCL +Me-Isothiazolinone Reaction: unknown-patch testing Medication:glyceryl thioglycolate  Reaction:unknown-patch testing   Quaternium-15 Other (See Comments)   unknown reaction-patching testing        Medication List     TAKE these medications    clobetasol cream 0.05 % Commonly known as: TEMOVATE Apply 1 Application topically daily as needed (itching).   clobetasol ointment 0.05 % Commonly known as: TEMOVATE Apply 1 Application topically at bedtime as needed (itching).   Eliquis 5 MG Tabs tablet Generic drug: apixaban TAKE ONE TABLET BY MOUTH TWICE DAILY What changed: how much to take   furosemide  40 MG tablet Commonly known as: LASIX TAKE TWO TABLETS BY MOUTH DAILY.   hydroxychloroquine 200 MG tablet Commonly known as: PLAQUENIL Take 200 mg by mouth 2 (two) times daily.   methocarbamol 500 MG tablet Commonly known as: ROBAXIN Take 1 tablet (500 mg total) by mouth every 6 (six) hours as needed for muscle spasms.   metoprolol succinate 25 MG 24 hr tablet Commonly known as: TOPROL-XL Take 1 tablet (25 mg total) by mouth daily. What changed: when to take this   multivitamin with minerals Tabs tablet Take 1 tablet by mouth in the morning. One-A-Day for Women 50+   naproxen sodium 220 MG tablet Commonly known as: ALEVE Take 440 mg by mouth daily as needed (pain).   oxyCODONE 5 MG immediate release tablet Commonly known as: Oxy IR/ROXICODONE Take 1-2 tablets (5-10 mg total) by mouth every 4 (four) hours as needed for severe pain (pain score 4-6).   potassium chloride 10 MEQ tablet Commonly known as: KLOR-CON Take 2 tablets (20 mEq total) by mouth daily with breakfast. What changed: when to take this   SYSTANE OP Place 1 drop into both eyes at bedtime.   Vitamin D 50 MCG (2000 UT) Caps Take 2,000 Units by mouth in the morning, at noon, and at bedtime.               Durable Medical Equipment  (From admission, onward)           Start     Ordered   04/25/22 0605  DME 3 n 1  Once        04/25/22 0604   04/25/22 1610  DME Walker rolling  Once       Question Answer Comment  Walker: With 5 Inch Wheels   Patient needs a walker to treat with the following condition Status post total right knee replacement      04/25/22 0604            Diagnostic Studies: DG Knee Right Port  Result Date: 04/24/2022 CLINICAL DATA:  Right knee replacement. EXAM: PORTABLE RIGHT KNEE - 1-2 VIEW COMPARISON:  None Available. FINDINGS: Right knee arthroplasty in expected alignment. No periprosthetic lucency or fracture. There has been patellar resurfacing. Recent postsurgical  change includes air and edema in the soft tissues and joint space. Anterior skin staples. IMPRESSION: Right knee arthroplasty without immediate postoperative complication. Electronically Signed   By: Keith Rake M.D.   On: 04/24/2022 16:35    Disposition: Discharge disposition: 03-Skilled [place]     Mcarthur Rossetti, MD Follow up  in 2 week(s).   Specialty: Orthopedic Surgery Contact information: [address] [place] [address] [phone]                  Signed: Mcarthur Rossetti 04/27/2022, 6:49 AM

## 2022-04-27 NOTE — Telephone Encounter (Signed)
They don't have CPM there Did you want this? If so, I need to get it ordered and delivered to them

## 2022-04-27 NOTE — Telephone Encounter (Signed)
Lindsay Clark from [facility] called. They need clarification on her DC orders. Don't have a CPM there. Would like someone to call her. [phone]

## 2022-04-27 NOTE — TOC Transition Note (Signed)
Transition of Care [facility]) - CM/SW Discharge Note   Patient Details  Name: Lindsay Clark MRN: [id number] Date of Birth: 1940/08/07  Transition of [facility]) CM/SW Contact:  Joanne Chars, LCSW Phone Number: 04/27/2022, 9:52 AM   Clinical Narrative:   Pt discharging to [facility], room 160.  RN call report to [phone].    CSW confirmed with [facility] that they are ready to receive pt.   Final next level of care: Skilled Nursing Facility Barriers to Discharge: Barriers Resolved   Patient Goals and CMS Choice   Choice offered to / list presented to : Patient (from [facility], wants to return)  Discharge Placement                Patient chooses bed at:  [facility]) Patient to be transferred to facility by: [place] Name of family member notified: husband Annie Main in room Patient and family notified of of transfer: 04/27/22  Discharge Plan and Services Additional resources added to the After Visit Summary for   In-house Referral: Clinical Social Work   Post Acute Care Choice: [place]                               Social Determinants of Health (SDOH) Interventions SDOH Screenings   Food Insecurity: No Food Insecurity (04/24/2022)  Housing: Low Risk  (04/24/2022)  Transportation Needs: No Transportation Needs (04/24/2022)  Utilities: Not At Risk (04/24/2022)  Depression (PHQ2-9): Low Risk  (10/05/2021)  Tobacco Use: Low Risk  (04/24/2022)     Readmission Risk Interventions     No data to display

## 2022-04-27 NOTE — Progress Notes (Signed)
Patient ID: Lindsay Clark, female   DOB: 12-12-1940, 82 y.o.   MRN: [id number] The patient is awake and alert.  There has been no acute changes.  Her potassium is normal at 3.6 today.  Her right operative knee is stable.  The plan is to discharge to skilled nursing today.  She will need transportation to the skilled nursing center set up today as well since her husband will not be able to transport her.  This is medically necessary as well given her limited mobility combined with both her and his advanced age.  She will not be able to climb up into his vehicle.

## 2022-04-27 NOTE — Progress Notes (Signed)
Report given to [place] at [facility]

## 2022-04-27 NOTE — Progress Notes (Signed)
OT Cancellation Note  Patient Details Name: Lindsay Clark MRN: [id number] DOB: 02-Apr-1941   Cancelled Treatment:    Reason Eval/Treat Not Completed: Other (comment) (Pt currently leaving unit to discharge to SNF. Will defer OT evaluation to staff OT at SNF. Acute OT signing off.)  Ailene Ravel, OTR/L,CBIS  Supplemental OT - [facility] and [facility] Secure [facility] Preferred   04/27/2022, 10:41 AM

## 2022-04-30 ENCOUNTER — Encounter: Payer: Self-pay | Admitting: Orthopaedic Surgery

## 2022-04-30 ENCOUNTER — Non-Acute Institutional Stay (SKILLED_NURSING_FACILITY): Payer: Medicare PPO | Admitting: Internal Medicine

## 2022-04-30 ENCOUNTER — Encounter (HOSPITAL_COMMUNITY): Payer: Self-pay | Admitting: Orthopaedic Surgery

## 2022-04-30 DIAGNOSIS — I4819 Other persistent atrial fibrillation: Secondary | ICD-10-CM | POA: Diagnosis not present

## 2022-04-30 DIAGNOSIS — R600 Localized edema: Secondary | ICD-10-CM

## 2022-04-30 DIAGNOSIS — Z96651 Presence of right artificial knee joint: Secondary | ICD-10-CM

## 2022-04-30 DIAGNOSIS — Z471 Aftercare following joint replacement surgery: Secondary | ICD-10-CM | POA: Diagnosis not present

## 2022-04-30 DIAGNOSIS — M62561 Muscle wasting and atrophy, not elsewhere classified, right lower leg: Secondary | ICD-10-CM | POA: Diagnosis not present

## 2022-04-30 DIAGNOSIS — I10 Essential (primary) hypertension: Secondary | ICD-10-CM | POA: Diagnosis not present

## 2022-04-30 DIAGNOSIS — L03818 Cellulitis of other sites: Secondary | ICD-10-CM | POA: Diagnosis not present

## 2022-04-30 DIAGNOSIS — M1711 Unilateral primary osteoarthritis, right knee: Secondary | ICD-10-CM | POA: Diagnosis not present

## 2022-04-30 DIAGNOSIS — R278 Other lack of coordination: Secondary | ICD-10-CM | POA: Diagnosis not present

## 2022-04-30 DIAGNOSIS — R2689 Other abnormalities of gait and mobility: Secondary | ICD-10-CM | POA: Diagnosis not present

## 2022-04-30 DIAGNOSIS — R2681 Unsteadiness on feet: Secondary | ICD-10-CM | POA: Diagnosis not present

## 2022-04-30 NOTE — Progress Notes (Addendum)
Provider:  Lavinia Clark Location:  [place] Room Number: 510-C Place of Service:  SNF (31)  PCP: [name], MD Patient Care Team: [name], MD as PCP - General (Family Medicine) Lelon Perla, MD as PCP - Cardiology (Cardiology) Pieter Partridge, DO as Consulting Physician (Neurology) Jovita Gamma, MD as Consulting Physician (Neurosurgery)  Extended Emergency Contact Information Primary Emergency Contact: Thibodaux,Stephen Address: [address]          [place], [place] [address] Johnnette Litter of [place] Phone: [phone] Mobile Phone: [phone] Relation: Spouse  Code Status: Full Code Goals of Care: Advanced Directive information    04/30/2022   11:12 AM  Advanced Directives  Does Patient Have a Medical Advance Directive? No  Does patient want to make changes to medical advance directive? No - Patient declined      Chief Complaint  Patient presents with   New Admit To SNF    HPI: Patient is a 82 y.o. female seen today for admission to Rehab   Patient was admitted in the hospital from 01/02-01/05 For Right Total TKA 04/24/22  Patient has a history of PAF s/p ablation May 2022 CAD, hypertension, lower extremity edema  Patient was electively admitted by Dr. Ninfa Linden for right total knee arthroplasty. Patient is now in [facility] rehab She was noticed by the nurses to having discharge from her dressing.  Also a red streak going down her knee.  Patient is also having pain in that area She has not been able to put weight on that leg. No fever or chills.  At baseline patient is independent and does not use any assistive devices.  Lives with her husband  Past Medical History:  Diagnosis Date   Atherosclerosis    mild cerebral   Atrial fibrillation and flutter (HCC)    Autoimmune disorder ([place])    Autoimmune encephalitis    Cervical disc disease    with spondylosis   Cervical spondylosis    Chronic  dermatitis    eczematous   Colitis    COVID 10/2020   DDD (degenerative disc disease), lumbar    Frozen shoulder    left   Hyperlipidemia    Hypertension    IBS (irritable bowel syndrome)    with diarrhea Pt stated she never had this   Lateral epicondylitis    Lumbar spondylosis    Lupus (HCC)    Movement disorder    Multiple lesions on CT of brain and spine    reports lesions from previous CT scan at [facility]    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   PONV (postoperative nausea and vomiting)    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   Torn meniscus    left   Past Surgical History:  Procedure Laterality Date   A FLUTTER ABLATION  08/2020   [facility] and had cardioversion at the same time   CATARACT EXTRACTION     COLONOSCOPY     EYE SURGERY Left    HEMORRHOID BANDING     KNEE ARTHROSCOPY Left 08/09/2020   Procedure: LEFT KNEE ARTHROSCOPY MEDIAL AND LATERAL [place];  Surgeon: Melrose Nakayama, MD;  [facility];  Service: Orthopedics;  Laterality: Left;   LOOP RECORDER INSERTION N/A 07/05/2017   Procedure: LOOP RECORDER INSERTION;  Surgeon: Thompson Grayer, MD;  Location: [place] CV [facility];  Service: Cardiovascular;  Laterality: N/A;   TOTAL KNEE ARTHROPLASTY Right 04/24/2022   Procedure: RIGHT TOTAL KNEE ARTHROPLASTY;  Surgeon:  Mcarthur Rossetti, MD;  Location: [place];  Service: Orthopedics;  Laterality: Right;   TUBAL LIGATION  1980   WISDOM TOOTH EXTRACTION      reports that she has never smoked. She has never used smokeless tobacco. She reports current alcohol use of about 4.0 standard drinks of alcohol per week. She reports that she does not use drugs. Social History   Socioeconomic History   Marital status: Married    Spouse name: [name]   Number of children: 2   Years of education: MAx2   Highest education level: Not on file  Occupational History   Occupation: Retired    Fish farm manager: OTHER  Tobacco Use   Smoking status: Never    Smokeless tobacco: Never  Vaping Use   Vaping Use: Never used  Substance and Sexual Activity   Alcohol use: Yes    Alcohol/week: 4.0 standard drinks of alcohol    Types: 4 Glasses of wine per week    Comment: 7 glasses of wine weekly   Drug use: No   Sexual activity: Not Currently  Other Topics Concern   Not on file  Social History Narrative   Patient lives at home with her spouse.   Caffeine Use: 2-3 cups daily   Social Determinants of Health   Financial Resource Strain: Not on file  Food Insecurity: No Food Insecurity (04/24/2022)   Hunger Vital Sign    Worried About Running Out of Food in the Last Year: Never true    Ran Out of Food in the Last Year: Never true  Transportation Needs: No Transportation Needs (04/24/2022)   PRAPARE - Hydrologist (Medical): No    Lack of Transportation (Non-Medical): No  Physical Activity: Not on file  Stress: Not on file  Social Connections: Not on file  Intimate Partner Violence: Not At Risk (04/24/2022)   Humiliation, Afraid, Rape, and Kick questionnaire    Fear of Current or Ex-Partner: No    Emotionally Abused: No    Physically Abused: No    Sexually Abused: No    Functional Status Survey:    Family History  Problem Relation Age of Onset   Cancer Mother 73       colon cancer   Stroke Mother    Colon cancer Mother 8   Heart attack Father 7   Emphysema Father    Cancer Maternal Grandmother 76       uterine   Thyroid disease Maternal Grandmother    Thrombosis Maternal Grandfather    Thyroid disease Son     Health Maintenance  Topic Date Due   Medicare Annual Wellness (AWV)  Never done   Hepatitis C Screening  Never done   Zoster Vaccines- Shingrix (2 of 2) 07/03/2017   COLONOSCOPY (Pts 45-71yr Insurance coverage will need to be confirmed)  03/01/2021   INFLUENZA VACCINE  Never done   COVID-19 Vaccine (4 - 2023-24 season) 12/22/2021   DTaP/Tdap/Td (3 - Td or Tdap) 07/18/2022   Pneumonia  Vaccine 80 Years old  Completed   DEXA SCAN  Completed   HPV VACCINES  Aged Out    Allergies  Allergen Reactions   Amlodipine Swelling     leg swelling    Diltiazem Swelling    Bilateral feet   Methotrexate Swelling     swelling in ankles    Metronidazole Swelling     leg swelling   Other Other (See Comments)    Medication:Methychloroisothlazolinone/methylisothiazilinoneCL +Me-Isothiazolinone Reaction: unknown-patch testing  Medication:glyceryl thioglycolate  Reaction:unknown-patch testing   Quaternium-15 Other (See Comments)    unknown reaction-patching testing    Outpatient Encounter Medications as of 04/30/2022  Medication Sig   acetaminophen (TYLENOL) 325 MG tablet Take 650 mg by mouth every 4 (four) hours as needed.   Cholecalciferol (VITAMIN D) 50 MCG (2000 UT) CAPS Take 2,000 Units by mouth in the morning, at noon, and at bedtime.   clobetasol cream (TEMOVATE) 3.24 % Apply 1 Application topically daily as needed (itching).   clobetasol ointment (TEMOVATE) 4.01 % Apply 1 Application topically at bedtime as needed (itching).   docusate sodium (COLACE) 100 MG capsule Take 100 mg by mouth 2 (two) times daily.   ELIQUIS 5 MG TABS tablet TAKE ONE TABLET BY MOUTH TWICE DAILY   furosemide (LASIX) 40 MG tablet TAKE TWO TABLETS BY MOUTH DAILY.   hydroxychloroquine (PLAQUENIL) 200 MG tablet Take 200 mg by mouth 2 (two) times daily.   methocarbamol (ROBAXIN) 500 MG tablet Take 1 tablet (500 mg total) by mouth every 6 (six) hours as needed for muscle spasms.   metoprolol succinate (TOPROL-XL) 25 MG 24 hr tablet Take 1 tablet (25 mg total) by mouth daily.   Multiple Vitamin (MULTIVITAMIN WITH MINERALS) TABS tablet Take 1 tablet by mouth in the morning. One-A-Day for Women 50+   oxyCODONE (OXY IR/ROXICODONE) 5 MG immediate release tablet Take 1-2 tablets (5-10 mg total) by mouth every 4 (four) hours as needed for severe pain (pain score 4-6).   Polyethyl Glycol-Propyl Glycol (SYSTANE  OP) Place 1 drop into both eyes at bedtime.   potassium chloride (KLOR-CON) 10 MEQ tablet Take 2 tablets (20 mEq total) by mouth daily with breakfast.   [DISCONTINUED] naproxen sodium (ALEVE) 220 MG tablet Take 440 mg by mouth daily as needed (pain).   No facility-administered encounter medications on file as of 04/30/2022.    Review of Systems  Constitutional:  Negative for activity change and appetite change.  HENT: Negative.    Respiratory:  Negative for cough and shortness of breath.   Cardiovascular:  Positive for leg swelling.  Gastrointestinal:  Negative for constipation.  Genitourinary: Negative.   Musculoskeletal:  Positive for gait problem and joint swelling. Negative for arthralgias and myalgias.  Skin: Negative.   Neurological:  Negative for dizziness and weakness.  Psychiatric/Behavioral:  Negative for confusion, dysphoric mood and sleep disturbance.     Vitals:   04/30/22 1109  BP: 135/84  Pulse: 80  Resp: 17  Temp: (!) 97.5 F (36.4 C)  SpO2: 98%  Weight: 212 lb 3.2 oz (96.3 kg)  Height: '5\' 4"'$  (1.626 m)   Body mass index is 36.42 kg/m. Physical Exam Vitals reviewed.  Constitutional:      Appearance: Normal appearance.  HENT:     Head: Normocephalic.     Nose: Nose normal.     Mouth/Throat:     Mouth: Mucous membranes are moist.     Pharynx: Oropharynx is clear.  Eyes:     Pupils: Pupils are equal, round, and reactive to light.  Cardiovascular:     Rate and Rhythm: Normal rate and regular rhythm.     Pulses: Normal pulses.     Heart sounds: Normal heart sounds. No murmur heard. Pulmonary:     Effort: Pulmonary effort is normal.     Breath sounds: Normal breath sounds.  Abdominal:     General: Abdomen is flat. Bowel sounds are normal.     Palpations: Abdomen is soft.  Musculoskeletal:  General: Swelling present.     Cervical back: Neck supple.     Comments: Right Knee Dressing was removed  Redness Warm and tenderness in the center of the  incision Going down her Leg No Discharge noticed  Skin:    General: Skin is warm.  Neurological:     General: No focal deficit present.     Mental Status: She is alert and oriented to person, place, and time.  Psychiatric:        Mood and Affect: Mood normal.        Thought Content: Thought content normal.     Labs reviewed: Basic Metabolic Panel: Recent Labs    04/25/22 0628 04/26/22 0852 04/27/22 0438  NA 138 135 138  K 2.9* 2.6* 3.6  CL 97* 92* 99  CO2 29 32 31  GLUCOSE 127* 113* 118*  BUN '11 11 13  '$ CREATININE 0.56 0.75 0.64  CALCIUM 8.1* 8.0* 7.9*  MG  --  2.0  --    Liver Function Tests: No results for input(s): "AST", "ALT", "ALKPHOS", "BILITOT", "PROT", "ALBUMIN" in the last 8760 hours. No results for input(s): "LIPASE", "AMYLASE" in the last 8760 hours. No results for input(s): "AMMONIA" in the last 8760 hours. CBC: Recent Labs    04/12/22 1147 04/27/22 0438  WBC 5.9 8.6  HGB 13.4 10.5*  HCT 41.3 31.6*  MCV 93.9 93.8  PLT 198 167   Cardiac Enzymes: No results for input(s): "CKTOTAL", "CKMB", "CKMBINDEX", "TROPONINI" in the last 8760 hours. BNP: Invalid input(s): "POCBNP" No results found for: "HGBA1C" No results found for: "TSH" No results found for: "VITAMINB12" No results found for: "FOLATE" No results found for: "IRON", "TIBC", "FERRITIN"  Imaging and Procedures obtained prior to SNF admission: DG Knee Right Port  Result Date: 04/24/2022 CLINICAL DATA:  Right knee replacement. EXAM: PORTABLE RIGHT KNEE - 1-2 VIEW COMPARISON:  None Available. FINDINGS: Right knee arthroplasty in expected alignment. No periprosthetic lucency or fracture. There has been patellar resurfacing. Recent postsurgical change includes air and edema in the soft tissues and joint space. Anterior skin staples. IMPRESSION: Right knee arthroplasty without immediate postoperative complication. Electronically Signed   By: Keith Rake M.D.   On: 04/24/2022 16:35     Assessment/Plan 1. Status post total right knee replacement Pain Control with Oxycodone Robaxin  WBAT Therapy   2. Cellulitis of other specified site Doxycyline 100 mg BID for 10 days Dressing removed and use ABD dressing for Protection Early Ortho Appointment if not better in next few days 3. Primary hypertension Toprol  4. Persistent atrial fibrillation (HCC) Eliquis and Toprol  5. 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

## 2022-05-01 ENCOUNTER — Telehealth: Payer: Self-pay | Admitting: Orthopaedic Surgery

## 2022-05-01 DIAGNOSIS — R278 Other lack of coordination: Secondary | ICD-10-CM | POA: Diagnosis not present

## 2022-05-01 DIAGNOSIS — Z471 Aftercare following joint replacement surgery: Secondary | ICD-10-CM | POA: Diagnosis not present

## 2022-05-01 DIAGNOSIS — R2681 Unsteadiness on feet: Secondary | ICD-10-CM | POA: Diagnosis not present

## 2022-05-01 DIAGNOSIS — Z96651 Presence of right artificial knee joint: Secondary | ICD-10-CM | POA: Diagnosis not present

## 2022-05-01 DIAGNOSIS — M62561 Muscle wasting and atrophy, not elsewhere classified, right lower leg: Secondary | ICD-10-CM | POA: Diagnosis not present

## 2022-05-01 DIAGNOSIS — M6389 Disorders of muscle in diseases classified elsewhere, multiple sites: Secondary | ICD-10-CM | POA: Diagnosis not present

## 2022-05-01 DIAGNOSIS — M1711 Unilateral primary osteoarthritis, right knee: Secondary | ICD-10-CM | POA: Diagnosis not present

## 2022-05-01 DIAGNOSIS — R2689 Other abnormalities of gait and mobility: Secondary | ICD-10-CM | POA: Diagnosis not present

## 2022-05-01 NOTE — Telephone Encounter (Signed)
Lindsay Clark Investment banker, corporate) called from [facility] to report to Dr Ninfa Linden that pt was admitted to their facility yesterday and pt wound has some redness and warmth to it. Also report that pt has an infection and started her on a day antibiotics of doxycycline and have changed wound care to dry dressing. Pt has an appt for 1/15 but id Dr Ninfa Linden would like to see her sooner call Lindsay Clark at [phone]

## 2022-05-02 DIAGNOSIS — Z471 Aftercare following joint replacement surgery: Secondary | ICD-10-CM | POA: Diagnosis not present

## 2022-05-02 DIAGNOSIS — M6389 Disorders of muscle in diseases classified elsewhere, multiple sites: Secondary | ICD-10-CM | POA: Diagnosis not present

## 2022-05-02 DIAGNOSIS — Z96651 Presence of right artificial knee joint: Secondary | ICD-10-CM | POA: Diagnosis not present

## 2022-05-02 DIAGNOSIS — M62561 Muscle wasting and atrophy, not elsewhere classified, right lower leg: Secondary | ICD-10-CM | POA: Diagnosis not present

## 2022-05-02 DIAGNOSIS — M1711 Unilateral primary osteoarthritis, right knee: Secondary | ICD-10-CM | POA: Diagnosis not present

## 2022-05-02 DIAGNOSIS — R2681 Unsteadiness on feet: Secondary | ICD-10-CM | POA: Diagnosis not present

## 2022-05-02 DIAGNOSIS — R2689 Other abnormalities of gait and mobility: Secondary | ICD-10-CM | POA: Diagnosis not present

## 2022-05-02 DIAGNOSIS — R278 Other lack of coordination: Secondary | ICD-10-CM | POA: Diagnosis not present

## 2022-05-03 ENCOUNTER — Telehealth: Payer: Self-pay | Admitting: *Deleted

## 2022-05-03 ENCOUNTER — Encounter: Payer: Self-pay | Admitting: Orthopaedic Surgery

## 2022-05-03 ENCOUNTER — Ambulatory Visit (INDEPENDENT_AMBULATORY_CARE_PROVIDER_SITE_OTHER): Payer: Medicare PPO | Admitting: Orthopaedic Surgery

## 2022-05-03 DIAGNOSIS — M1711 Unilateral primary osteoarthritis, right knee: Secondary | ICD-10-CM | POA: Diagnosis not present

## 2022-05-03 DIAGNOSIS — Z96651 Presence of right artificial knee joint: Secondary | ICD-10-CM

## 2022-05-03 DIAGNOSIS — M6389 Disorders of muscle in diseases classified elsewhere, multiple sites: Secondary | ICD-10-CM | POA: Diagnosis not present

## 2022-05-03 DIAGNOSIS — R278 Other lack of coordination: Secondary | ICD-10-CM | POA: Diagnosis not present

## 2022-05-03 DIAGNOSIS — Z471 Aftercare following joint replacement surgery: Secondary | ICD-10-CM | POA: Diagnosis not present

## 2022-05-03 MED ORDER — COLCHICINE 0.6 MG PO CAPS: 0.6000 mg | ORAL_CAPSULE | Freq: Every day | ORAL | 2 refills | Status: DC

## 2022-05-03 NOTE — Telephone Encounter (Signed)
Ortho bundle in office meeting today.

## 2022-05-03 NOTE — Progress Notes (Signed)
The patient comes in early status post a right total knee arthroplasty because there was concern about a possible infection.  She has been having trouble with putting weight on her foot.  On exam it looks like she has gout and so we will treat her accordingly with colchicine.  Fortunately her right operative knee does not look infected at all.  It is bruised.  I am fine with her still being on doxycycline but the knee itself looks good.  There is no drainage and no cellulitis.  She will keep her follow-up appoint with [place] this coming Monday.  If the incision looks good then we can remove the staples and place Steri-Strips.  She can continue therapy as well working on aggressive range of motion of her right knee.

## 2022-05-04 DIAGNOSIS — R2689 Other abnormalities of gait and mobility: Secondary | ICD-10-CM | POA: Diagnosis not present

## 2022-05-04 DIAGNOSIS — M1711 Unilateral primary osteoarthritis, right knee: Secondary | ICD-10-CM | POA: Diagnosis not present

## 2022-05-04 DIAGNOSIS — R2681 Unsteadiness on feet: Secondary | ICD-10-CM | POA: Diagnosis not present

## 2022-05-04 DIAGNOSIS — Z96651 Presence of right artificial knee joint: Secondary | ICD-10-CM | POA: Diagnosis not present

## 2022-05-04 DIAGNOSIS — R278 Other lack of coordination: Secondary | ICD-10-CM | POA: Diagnosis not present

## 2022-05-04 DIAGNOSIS — M6389 Disorders of muscle in diseases classified elsewhere, multiple sites: Secondary | ICD-10-CM | POA: Diagnosis not present

## 2022-05-04 DIAGNOSIS — Z471 Aftercare following joint replacement surgery: Secondary | ICD-10-CM | POA: Diagnosis not present

## 2022-05-04 DIAGNOSIS — M62561 Muscle wasting and atrophy, not elsewhere classified, right lower leg: Secondary | ICD-10-CM | POA: Diagnosis not present

## 2022-05-07 ENCOUNTER — Non-Acute Institutional Stay (SKILLED_NURSING_FACILITY): Payer: Medicare PPO | Admitting: Internal Medicine

## 2022-05-07 ENCOUNTER — Encounter: Payer: Self-pay | Admitting: Orthopaedic Surgery

## 2022-05-07 ENCOUNTER — Encounter: Payer: Self-pay | Admitting: Internal Medicine

## 2022-05-07 ENCOUNTER — Ambulatory Visit (INDEPENDENT_AMBULATORY_CARE_PROVIDER_SITE_OTHER): Payer: Medicare PPO | Admitting: Orthopaedic Surgery

## 2022-05-07 DIAGNOSIS — Z96651 Presence of right artificial knee joint: Secondary | ICD-10-CM

## 2022-05-07 DIAGNOSIS — R2681 Unsteadiness on feet: Secondary | ICD-10-CM | POA: Diagnosis not present

## 2022-05-07 DIAGNOSIS — R2689 Other abnormalities of gait and mobility: Secondary | ICD-10-CM | POA: Diagnosis not present

## 2022-05-07 DIAGNOSIS — R600 Localized edema: Secondary | ICD-10-CM

## 2022-05-07 DIAGNOSIS — M62561 Muscle wasting and atrophy, not elsewhere classified, right lower leg: Secondary | ICD-10-CM | POA: Diagnosis not present

## 2022-05-07 DIAGNOSIS — M1711 Unilateral primary osteoarthritis, right knee: Secondary | ICD-10-CM | POA: Diagnosis not present

## 2022-05-07 DIAGNOSIS — I10 Essential (primary) hypertension: Secondary | ICD-10-CM | POA: Diagnosis not present

## 2022-05-07 DIAGNOSIS — I4819 Other persistent atrial fibrillation: Secondary | ICD-10-CM | POA: Diagnosis not present

## 2022-05-07 DIAGNOSIS — R278 Other lack of coordination: Secondary | ICD-10-CM | POA: Diagnosis not present

## 2022-05-07 DIAGNOSIS — Z471 Aftercare following joint replacement surgery: Secondary | ICD-10-CM | POA: Diagnosis not present

## 2022-05-07 NOTE — Progress Notes (Signed)
Location:   [facility] Room Number: [place] of Service:  SNF [phone])  Provider: Veleta Miners, MD  PCP: [name], MD Patient Care Team: [name], MD as PCP - General (Family Medicine) Lelon Perla, MD as PCP - Cardiology (Cardiology) Pieter Partridge, DO as Consulting Physician (Neurology) Jovita Gamma, MD as Consulting Physician (Neurosurgery)  Extended Emergency Contact Information Primary Emergency Contact: Cohick,Stephen Address: [address]          [place], [place] [address] Johnnette Litter of [place] Phone: [phone] Mobile Phone: [phone] Relation: Spouse  Code Status: FULL CODE Goals of care:  Advanced Directive information    05/07/2022    1:01 PM  Advanced Directives  Does Patient Have a Medical Advance Directive? No     Allergies  Allergen Reactions   Amlodipine Swelling     leg swelling    Diltiazem Swelling    Bilateral feet   Methotrexate Swelling     swelling in ankles    Metronidazole Swelling     leg swelling   Other Other (See Comments)    Medication:Methychloroisothlazolinone/methylisothiazilinoneCL +Me-Isothiazolinone Reaction: unknown-patch testing Medication:glyceryl thioglycolate  Reaction:unknown-patch testing   Quaternium-15 Other (See Comments)    unknown reaction-patching testing    Chief Complaint  Patient presents with   Discharge Note    Discharge    HPI:  82 y.o. female  Discharged to her apartment  Patient was admitted in the hospital from 01/02-01/05 For Right Total TKA 04/24/22   Patient has a history of PAF s/p ablation May 2022 CAD, hypertension, lower extremity edema   Patient was electively admitted by Dr. Ninfa Linden for right total knee arthroplasty.  Doing well  Walking with her walker Needing no assist anymore Pain Controlled with Tylenol Wants to go back to her apartment Therapy are going to evaluate her apartment for safety  Had no complains  today  Past Medical History:  Diagnosis Date   Atherosclerosis    mild cerebral   Atrial fibrillation and flutter (HCC)    Autoimmune disorder (HCC)    Autoimmune encephalitis    Cervical disc disease    with spondylosis   Cervical spondylosis    Chronic dermatitis    eczematous   Colitis    COVID 10/2020   DDD (degenerative disc disease), lumbar    Frozen shoulder    left   Hyperlipidemia    Hypertension    IBS (irritable bowel syndrome)    with diarrhea Pt stated she never had this   Lateral epicondylitis    Lumbar spondylosis    Lupus (HCC)    Movement disorder    Multiple lesions on CT of brain and spine    reports lesions from previous CT scan at [facility]    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   PONV (postoperative nausea and vomiting)    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   Torn meniscus    left    Past Surgical History:  Procedure Laterality Date   A FLUTTER ABLATION  08/2020   [facility] and had cardioversion at the same time   CATARACT EXTRACTION     COLONOSCOPY     EYE SURGERY Left    HEMORRHOID BANDING     KNEE ARTHROSCOPY Left 08/09/2020   Procedure: LEFT KNEE ARTHROSCOPY MEDIAL AND LATERAL [place];  Surgeon: Melrose Nakayama, MD;  Location: [facility];  Service: Orthopedics;  Laterality: Left;   LOOP RECORDER INSERTION N/A 07/05/2017  Procedure: LOOP RECORDER INSERTION;  Surgeon: Thompson Grayer, MD;  Location: [facility];  Service: Cardiovascular;  Laterality: N/A;   TOTAL KNEE ARTHROPLASTY Right 04/24/2022   Procedure: RIGHT TOTAL KNEE ARTHROPLASTY;  Surgeon: Mcarthur Rossetti, MD;  Location: [facility];  Service: Orthopedics;  Laterality: Right;   TUBAL LIGATION  1980   WISDOM TOOTH EXTRACTION        reports that she has never smoked. She has never used smokeless tobacco. She reports current alcohol use of about 4.0 standard drinks of alcohol per week. She reports that she does not use drugs. Social History    Socioeconomic History   Marital status: Married    Spouse name: [name]   Number of children: 2   Years of education: MAx2   Highest education level: Not on file  Occupational History   Occupation: Retired    Fish farm manager: OTHER  Tobacco Use   Smoking status: Never   Smokeless tobacco: Never  Vaping Use   Vaping Use: Never used  Substance and Sexual Activity   Alcohol use: Yes    Alcohol/week: 4.0 standard drinks of alcohol    Types: 4 Glasses of wine per week    Comment: 7 glasses of wine weekly   Drug use: No   Sexual activity: Not Currently  Other Topics Concern   Not on file  Social History Narrative   Patient lives at home with her spouse.   Caffeine Use: 2-3 cups daily   Social Determinants of Health   Financial Resource Strain: Not on file  Food Insecurity: No Food Insecurity (04/24/2022)   Hunger Vital Sign    Worried About Running Out of Food in the Last Year: Never true    Ran Out of Food in the Last Year: Never true  Transportation Needs: No Transportation Needs (04/24/2022)   PRAPARE - Hydrologist (Medical): No    Lack of Transportation (Non-Medical): No  Physical Activity: Not on file  Stress: Not on file  Social Connections: Not on file  Intimate Partner Violence: Not At Risk (04/24/2022)   Humiliation, Afraid, Rape, and Kick questionnaire    Fear of Current or Ex-Partner: No    Emotionally Abused: No    Physically Abused: No    Sexually Abused: No   Functional Status Survey:    Allergies  Allergen Reactions   Amlodipine Swelling     leg swelling    Diltiazem Swelling    Bilateral feet   Methotrexate Swelling     swelling in ankles    Metronidazole Swelling     leg swelling   Other Other (See Comments)    Medication:Methychloroisothlazolinone/methylisothiazilinoneCL +Me-Isothiazolinone Reaction: unknown-patch testing Medication:glyceryl thioglycolate  Reaction:unknown-patch testing   Quaternium-15 Other (See  Comments)    unknown reaction-patching testing    Pertinent  Health Maintenance Due  Topic Date Due   COLONOSCOPY (Pts 45-23yr Insurance coverage will need to be confirmed)  03/01/2021   INFLUENZA VACCINE  Never done   DEXA SCAN  Completed    Medications: Allergies as of 05/07/2022       Reactions   Amlodipine Swelling    leg swelling   Diltiazem Swelling   Bilateral feet   Methotrexate Swelling    swelling in ankles   Metronidazole Swelling    leg swelling   Other Other (See Comments)   Medication:Methychloroisothlazolinone/methylisothiazilinoneCL +Me-Isothiazolinone Reaction: unknown-patch testing Medication:glyceryl thioglycolate  Reaction:unknown-patch testing   Quaternium-15 Other (See Comments)   unknown reaction-patching testing  Medication List        Accurate as of May 07, 2022  1:32 PM. If you have any questions, ask your nurse or doctor.          STOP taking these medications    Colace 100 MG capsule Generic drug: docusate sodium Stopped by: Virgie Dad, MD   Tylenol 325 MG tablet Generic drug: acetaminophen Stopped by: Virgie Dad, MD       TAKE these medications    clobetasol cream 0.05 % Commonly known as: TEMOVATE Apply 1 Application topically daily as needed (itching).   clobetasol ointment 0.05 % Commonly known as: TEMOVATE Apply 1 Application topically at bedtime as needed (itching).   Colchicine 0.6 MG Caps Take 0.6 mg by mouth daily. Take one capsule  Every hour for three doses first day then one daily thereafter for one month.   DOXYCYCLINE HYCLATE PO Take 100 mg by mouth 2 (two) times daily.   Eliquis 5 MG Tabs tablet Generic drug: apixaban TAKE ONE TABLET BY MOUTH TWICE DAILY   furosemide 40 MG tablet Commonly known as: LASIX TAKE TWO TABLETS BY MOUTH DAILY.   hydroxychloroquine 200 MG tablet Commonly known as: PLAQUENIL Take 200 mg by mouth 2 (two) times daily.   methocarbamol 500 MG  tablet Commonly known as: ROBAXIN Take 1 tablet (500 mg total) by mouth every 6 (six) hours as needed for muscle spasms.   metoprolol succinate 25 MG 24 hr tablet Commonly known as: TOPROL-XL Take 1 tablet (25 mg total) by mouth daily.   multivitamin with minerals Tabs tablet Take 1 tablet by mouth in the morning. One-A-Day for Women 50+   oxyCODONE 5 MG immediate release tablet Commonly known as: Oxy IR/ROXICODONE Take 5 mg by mouth every 4 (four) hours as needed for moderate pain. What changed: Another medication with the same name was removed. Continue taking this medication, and follow the directions you see here. Changed by: Virgie Dad, MD   Oxycodone HCl 10 MG Tabs Take 10 mg by mouth every 4 (four) hours as needed (For SEVERE pain). What changed: Another medication with the same name was removed. Continue taking this medication, and follow the directions you see here. Changed by: Virgie Dad, MD   polyethylene glycol 17 g packet Commonly known as: MIRALAX / GLYCOLAX Take 17 g by mouth daily as needed.   potassium chloride 10 MEQ tablet Commonly known as: KLOR-CON Take 2 tablets (20 mEq total) by mouth daily with breakfast.   saccharomyces boulardii 250 MG capsule Commonly known as: FLORASTOR Take 250 mg by mouth 2 (two) times daily.   SYSTANE OP Place 1 drop into both eyes at bedtime.   Vitamin D 50 MCG (2000 UT) Caps Take 2,000 Units by mouth in the morning and at bedtime.        Review of Systems  Constitutional:  Negative for activity change and appetite change.  HENT: Negative.    Respiratory:  Negative for cough and shortness of breath.   Cardiovascular:  Negative for leg swelling.  Gastrointestinal:  Negative for constipation.  Genitourinary: Negative.   Musculoskeletal:  Positive for gait problem. Negative for arthralgias and myalgias.  Skin: Negative.   Neurological:  Negative for dizziness and weakness.  Psychiatric/Behavioral:  Negative  for confusion, dysphoric mood and sleep disturbance.     Vitals:   05/07/22 1300  BP: (!) 146/80  Pulse: 69  Resp: 18  Temp: 97.8 F (36.6 C)  SpO2: 94%  Weight: 207 lb 9.6 oz (94.2 kg)  Height: '5\' 4"'$  (1.626 m)   Body mass index is 35.63 kg/m. Physical Exam Vitals reviewed.  Constitutional:      Appearance: Normal appearance.  HENT:     Head: Normocephalic.     Nose: Nose normal.     Mouth/Throat:     Mouth: Mucous membranes are moist.     Pharynx: Oropharynx is clear.  Eyes:     Pupils: Pupils are equal, round, and reactive to light.  Cardiovascular:     Rate and Rhythm: Normal rate and regular rhythm.     Pulses: Normal pulses.     Heart sounds: Normal heart sounds. No murmur heard. Pulmonary:     Effort: Pulmonary effort is normal.     Breath sounds: Normal breath sounds.  Abdominal:     General: Abdomen is flat. Bowel sounds are normal.     Palpations: Abdomen is soft.  Musculoskeletal:     Cervical back: Neck supple.  Skin:    General: Skin is warm.  Neurological:     General: No focal deficit present.     Mental Status: She is alert and oriented to person, place, and time.  Psychiatric:        Mood and Affect: Mood normal.        Thought Content: Thought content normal.     Labs reviewed: Basic Metabolic Panel: Recent Labs    04/25/22 0628 04/26/22 0852 04/27/22 0438  NA 138 135 138  K 2.9* 2.6* 3.6  CL 97* 92* 99  CO2 29 32 31  GLUCOSE 127* 113* 118*  BUN '11 11 13  '$ CREATININE 0.56 0.75 0.64  CALCIUM 8.1* 8.0* 7.9*  MG  --  2.0  --    Liver Function Tests: No results for input(s): "AST", "ALT", "ALKPHOS", "BILITOT", "PROT", "ALBUMIN" in the last 8760 hours. No results for input(s): "LIPASE", "AMYLASE" in the last 8760 hours. No results for input(s): "AMMONIA" in the last 8760 hours. CBC: Recent Labs    04/12/22 1147 04/27/22 0438  WBC 5.9 8.6  HGB 13.4 10.5*  HCT 41.3 31.6*  MCV 93.9 93.8  PLT 198 167   Cardiac Enzymes: No  results for input(s): "CKTOTAL", "CKMB", "CKMBINDEX", "TROPONINI" in the last 8760 hours. BNP: Invalid input(s): "POCBNP" CBG: No results for input(s): "GLUCAP" in the last 8760 hours.  Procedures and Imaging Studies During Stay: DG Knee Right Port  Result Date: 04/24/2022 CLINICAL DATA:  Right knee replacement. EXAM: PORTABLE RIGHT KNEE - 1-2 VIEW COMPARISON:  None Available. FINDINGS: Right knee arthroplasty in expected alignment. No periprosthetic lucency or fracture. There has been patellar resurfacing. Recent postsurgical change includes air and edema in the soft tissues and joint space. Anterior skin staples. IMPRESSION: Right knee arthroplasty without immediate postoperative complication. Electronically Signed   By: Keith Rake M.D.   On: 04/24/2022 16:35    Assessment/Plan:   1. Status post total right knee replacement Doing well Follow with Ortho for staples removal Pain Controlled with tylenol Discontinue Oxycodone Discharged home with walker and therapy  2. Primary hypertension On Toprol  3. Persistent atrial fibrillation (HCC) Eliquis and Toprol  4. Bilateral leg edema Continue Lasix     Patient has been advised to f/u with their PCP in 1-2 weeks to bring them up to date on their rehab stay.   Future labs/tests needed:

## 2022-05-07 NOTE — Progress Notes (Signed)
The patient is now 2 weeks tomorrow status post a right total knee arthroplasty.  We just saw her last week for wound check.  Things are looking great today.  On exam her right knee staples have been removed and Steri-Strips applied.  She has almost full extension to at least 90 degrees flexion.  She is transitioning to home therapy from the skilled nursing section of [facility].  Her calf is soft.  She has good function of her foot and ankle.  Her gout symptoms resolved with colchicine.  From my standpoint I will need to see her back for 4 weeks unless she is having issues.  That will be for a repeat exam in terms of range of motion of her knee but no x-rays are needed.  If there are issues before then she knows to let us know.

## 2022-05-08 DIAGNOSIS — M62561 Muscle wasting and atrophy, not elsewhere classified, right lower leg: Secondary | ICD-10-CM | POA: Diagnosis not present

## 2022-05-08 DIAGNOSIS — Z471 Aftercare following joint replacement surgery: Secondary | ICD-10-CM | POA: Diagnosis not present

## 2022-05-08 DIAGNOSIS — M1711 Unilateral primary osteoarthritis, right knee: Secondary | ICD-10-CM | POA: Diagnosis not present

## 2022-05-08 DIAGNOSIS — R278 Other lack of coordination: Secondary | ICD-10-CM | POA: Diagnosis not present

## 2022-05-08 DIAGNOSIS — R2681 Unsteadiness on feet: Secondary | ICD-10-CM | POA: Diagnosis not present

## 2022-05-08 DIAGNOSIS — Z96651 Presence of right artificial knee joint: Secondary | ICD-10-CM | POA: Diagnosis not present

## 2022-05-08 DIAGNOSIS — M6389 Disorders of muscle in diseases classified elsewhere, multiple sites: Secondary | ICD-10-CM | POA: Diagnosis not present

## 2022-05-08 DIAGNOSIS — R2689 Other abnormalities of gait and mobility: Secondary | ICD-10-CM | POA: Diagnosis not present

## 2022-05-09 DIAGNOSIS — Z471 Aftercare following joint replacement surgery: Secondary | ICD-10-CM | POA: Diagnosis not present

## 2022-05-09 DIAGNOSIS — M1711 Unilateral primary osteoarthritis, right knee: Secondary | ICD-10-CM | POA: Diagnosis not present

## 2022-05-09 DIAGNOSIS — R278 Other lack of coordination: Secondary | ICD-10-CM | POA: Diagnosis not present

## 2022-05-09 DIAGNOSIS — M6389 Disorders of muscle in diseases classified elsewhere, multiple sites: Secondary | ICD-10-CM | POA: Diagnosis not present

## 2022-05-10 DIAGNOSIS — Z96651 Presence of right artificial knee joint: Secondary | ICD-10-CM | POA: Diagnosis not present

## 2022-05-10 DIAGNOSIS — M62561 Muscle wasting and atrophy, not elsewhere classified, right lower leg: Secondary | ICD-10-CM | POA: Diagnosis not present

## 2022-05-10 DIAGNOSIS — R2681 Unsteadiness on feet: Secondary | ICD-10-CM | POA: Diagnosis not present

## 2022-05-10 DIAGNOSIS — E876 Hypokalemia: Secondary | ICD-10-CM | POA: Diagnosis not present

## 2022-05-10 DIAGNOSIS — Z9989 Dependence on other enabling machines and devices: Secondary | ICD-10-CM | POA: Diagnosis not present

## 2022-05-10 DIAGNOSIS — R278 Other lack of coordination: Secondary | ICD-10-CM | POA: Diagnosis not present

## 2022-05-10 DIAGNOSIS — R2689 Other abnormalities of gait and mobility: Secondary | ICD-10-CM | POA: Diagnosis not present

## 2022-05-10 DIAGNOSIS — Z471 Aftercare following joint replacement surgery: Secondary | ICD-10-CM | POA: Diagnosis not present

## 2022-05-10 DIAGNOSIS — I10 Essential (primary) hypertension: Secondary | ICD-10-CM | POA: Diagnosis not present

## 2022-05-10 DIAGNOSIS — E559 Vitamin D deficiency, unspecified: Secondary | ICD-10-CM | POA: Diagnosis not present

## 2022-05-10 DIAGNOSIS — D649 Anemia, unspecified: Secondary | ICD-10-CM | POA: Diagnosis not present

## 2022-05-10 DIAGNOSIS — I480 Paroxysmal atrial fibrillation: Secondary | ICD-10-CM | POA: Diagnosis not present

## 2022-05-10 DIAGNOSIS — M109 Gout, unspecified: Secondary | ICD-10-CM | POA: Diagnosis not present

## 2022-05-10 DIAGNOSIS — M1711 Unilateral primary osteoarthritis, right knee: Secondary | ICD-10-CM | POA: Diagnosis not present

## 2022-05-10 DIAGNOSIS — D6869 Other thrombophilia: Secondary | ICD-10-CM | POA: Diagnosis not present

## 2022-05-11 ENCOUNTER — Telehealth: Payer: Self-pay | Admitting: *Deleted

## 2022-05-11 NOTE — Telephone Encounter (Signed)
Ortho bundle 14 day call completed.

## 2022-05-14 DIAGNOSIS — Z96651 Presence of right artificial knee joint: Secondary | ICD-10-CM | POA: Diagnosis not present

## 2022-05-14 DIAGNOSIS — M62561 Muscle wasting and atrophy, not elsewhere classified, right lower leg: Secondary | ICD-10-CM | POA: Diagnosis not present

## 2022-05-14 DIAGNOSIS — M1711 Unilateral primary osteoarthritis, right knee: Secondary | ICD-10-CM | POA: Diagnosis not present

## 2022-05-14 DIAGNOSIS — M6389 Disorders of muscle in diseases classified elsewhere, multiple sites: Secondary | ICD-10-CM | POA: Diagnosis not present

## 2022-05-14 DIAGNOSIS — R2681 Unsteadiness on feet: Secondary | ICD-10-CM | POA: Diagnosis not present

## 2022-05-14 DIAGNOSIS — Z471 Aftercare following joint replacement surgery: Secondary | ICD-10-CM | POA: Diagnosis not present

## 2022-05-14 DIAGNOSIS — R2689 Other abnormalities of gait and mobility: Secondary | ICD-10-CM | POA: Diagnosis not present

## 2022-05-14 DIAGNOSIS — R278 Other lack of coordination: Secondary | ICD-10-CM | POA: Diagnosis not present

## 2022-05-15 ENCOUNTER — Encounter: Payer: Self-pay | Admitting: Orthopaedic Surgery

## 2022-05-16 DIAGNOSIS — M62561 Muscle wasting and atrophy, not elsewhere classified, right lower leg: Secondary | ICD-10-CM | POA: Diagnosis not present

## 2022-05-16 DIAGNOSIS — R2689 Other abnormalities of gait and mobility: Secondary | ICD-10-CM | POA: Diagnosis not present

## 2022-05-16 DIAGNOSIS — R278 Other lack of coordination: Secondary | ICD-10-CM | POA: Diagnosis not present

## 2022-05-16 DIAGNOSIS — M1711 Unilateral primary osteoarthritis, right knee: Secondary | ICD-10-CM | POA: Diagnosis not present

## 2022-05-16 DIAGNOSIS — R2681 Unsteadiness on feet: Secondary | ICD-10-CM | POA: Diagnosis not present

## 2022-05-16 DIAGNOSIS — Z471 Aftercare following joint replacement surgery: Secondary | ICD-10-CM | POA: Diagnosis not present

## 2022-05-16 DIAGNOSIS — Z96651 Presence of right artificial knee joint: Secondary | ICD-10-CM | POA: Diagnosis not present

## 2022-05-21 DIAGNOSIS — Z471 Aftercare following joint replacement surgery: Secondary | ICD-10-CM | POA: Diagnosis not present

## 2022-05-21 DIAGNOSIS — Z96651 Presence of right artificial knee joint: Secondary | ICD-10-CM | POA: Diagnosis not present

## 2022-05-21 DIAGNOSIS — R278 Other lack of coordination: Secondary | ICD-10-CM | POA: Diagnosis not present

## 2022-05-21 DIAGNOSIS — R2689 Other abnormalities of gait and mobility: Secondary | ICD-10-CM | POA: Diagnosis not present

## 2022-05-21 DIAGNOSIS — M62561 Muscle wasting and atrophy, not elsewhere classified, right lower leg: Secondary | ICD-10-CM | POA: Diagnosis not present

## 2022-05-21 DIAGNOSIS — R2681 Unsteadiness on feet: Secondary | ICD-10-CM | POA: Diagnosis not present

## 2022-05-21 DIAGNOSIS — M1711 Unilateral primary osteoarthritis, right knee: Secondary | ICD-10-CM | POA: Diagnosis not present

## 2022-05-22 DIAGNOSIS — Z471 Aftercare following joint replacement surgery: Secondary | ICD-10-CM | POA: Diagnosis not present

## 2022-05-22 DIAGNOSIS — M1711 Unilateral primary osteoarthritis, right knee: Secondary | ICD-10-CM | POA: Diagnosis not present

## 2022-05-22 DIAGNOSIS — M6389 Disorders of muscle in diseases classified elsewhere, multiple sites: Secondary | ICD-10-CM | POA: Diagnosis not present

## 2022-05-22 DIAGNOSIS — R278 Other lack of coordination: Secondary | ICD-10-CM | POA: Diagnosis not present

## 2022-05-23 DIAGNOSIS — M6389 Disorders of muscle in diseases classified elsewhere, multiple sites: Secondary | ICD-10-CM | POA: Diagnosis not present

## 2022-05-23 DIAGNOSIS — R2681 Unsteadiness on feet: Secondary | ICD-10-CM | POA: Diagnosis not present

## 2022-05-23 DIAGNOSIS — Z471 Aftercare following joint replacement surgery: Secondary | ICD-10-CM | POA: Diagnosis not present

## 2022-05-23 DIAGNOSIS — M1711 Unilateral primary osteoarthritis, right knee: Secondary | ICD-10-CM | POA: Diagnosis not present

## 2022-05-23 DIAGNOSIS — M62561 Muscle wasting and atrophy, not elsewhere classified, right lower leg: Secondary | ICD-10-CM | POA: Diagnosis not present

## 2022-05-23 DIAGNOSIS — R278 Other lack of coordination: Secondary | ICD-10-CM | POA: Diagnosis not present

## 2022-05-23 DIAGNOSIS — Z96651 Presence of right artificial knee joint: Secondary | ICD-10-CM | POA: Diagnosis not present

## 2022-05-23 DIAGNOSIS — R2689 Other abnormalities of gait and mobility: Secondary | ICD-10-CM | POA: Diagnosis not present

## 2022-05-25 DIAGNOSIS — Z471 Aftercare following joint replacement surgery: Secondary | ICD-10-CM | POA: Diagnosis not present

## 2022-05-25 DIAGNOSIS — Z96651 Presence of right artificial knee joint: Secondary | ICD-10-CM | POA: Diagnosis not present

## 2022-05-25 DIAGNOSIS — R2681 Unsteadiness on feet: Secondary | ICD-10-CM | POA: Diagnosis not present

## 2022-05-25 DIAGNOSIS — M1711 Unilateral primary osteoarthritis, right knee: Secondary | ICD-10-CM | POA: Diagnosis not present

## 2022-05-25 DIAGNOSIS — R2689 Other abnormalities of gait and mobility: Secondary | ICD-10-CM | POA: Diagnosis not present

## 2022-05-25 DIAGNOSIS — R278 Other lack of coordination: Secondary | ICD-10-CM | POA: Diagnosis not present

## 2022-05-25 DIAGNOSIS — M62561 Muscle wasting and atrophy, not elsewhere classified, right lower leg: Secondary | ICD-10-CM | POA: Diagnosis not present

## 2022-05-28 DIAGNOSIS — Z96651 Presence of right artificial knee joint: Secondary | ICD-10-CM | POA: Diagnosis not present

## 2022-05-28 DIAGNOSIS — R278 Other lack of coordination: Secondary | ICD-10-CM | POA: Diagnosis not present

## 2022-05-28 DIAGNOSIS — Z471 Aftercare following joint replacement surgery: Secondary | ICD-10-CM | POA: Diagnosis not present

## 2022-05-28 DIAGNOSIS — M62561 Muscle wasting and atrophy, not elsewhere classified, right lower leg: Secondary | ICD-10-CM | POA: Diagnosis not present

## 2022-05-28 DIAGNOSIS — R2681 Unsteadiness on feet: Secondary | ICD-10-CM | POA: Diagnosis not present

## 2022-05-28 DIAGNOSIS — M1711 Unilateral primary osteoarthritis, right knee: Secondary | ICD-10-CM | POA: Diagnosis not present

## 2022-05-28 DIAGNOSIS — R2689 Other abnormalities of gait and mobility: Secondary | ICD-10-CM | POA: Diagnosis not present

## 2022-05-29 ENCOUNTER — Telehealth: Payer: Self-pay | Admitting: *Deleted

## 2022-05-29 NOTE — Telephone Encounter (Signed)
Ortho bundle 30 day call completed. °

## 2022-05-30 DIAGNOSIS — R2681 Unsteadiness on feet: Secondary | ICD-10-CM | POA: Diagnosis not present

## 2022-05-30 DIAGNOSIS — M1711 Unilateral primary osteoarthritis, right knee: Secondary | ICD-10-CM | POA: Diagnosis not present

## 2022-05-30 DIAGNOSIS — M62561 Muscle wasting and atrophy, not elsewhere classified, right lower leg: Secondary | ICD-10-CM | POA: Diagnosis not present

## 2022-05-30 DIAGNOSIS — Z96651 Presence of right artificial knee joint: Secondary | ICD-10-CM | POA: Diagnosis not present

## 2022-05-30 DIAGNOSIS — Z471 Aftercare following joint replacement surgery: Secondary | ICD-10-CM | POA: Diagnosis not present

## 2022-05-30 DIAGNOSIS — R2689 Other abnormalities of gait and mobility: Secondary | ICD-10-CM | POA: Diagnosis not present

## 2022-05-30 DIAGNOSIS — R278 Other lack of coordination: Secondary | ICD-10-CM | POA: Diagnosis not present

## 2022-05-31 ENCOUNTER — Encounter: Payer: Self-pay | Admitting: Orthopaedic Surgery

## 2022-06-01 DIAGNOSIS — R2681 Unsteadiness on feet: Secondary | ICD-10-CM | POA: Diagnosis not present

## 2022-06-01 DIAGNOSIS — M1711 Unilateral primary osteoarthritis, right knee: Secondary | ICD-10-CM | POA: Diagnosis not present

## 2022-06-01 DIAGNOSIS — M62561 Muscle wasting and atrophy, not elsewhere classified, right lower leg: Secondary | ICD-10-CM | POA: Diagnosis not present

## 2022-06-01 DIAGNOSIS — Z96651 Presence of right artificial knee joint: Secondary | ICD-10-CM | POA: Diagnosis not present

## 2022-06-01 DIAGNOSIS — R2689 Other abnormalities of gait and mobility: Secondary | ICD-10-CM | POA: Diagnosis not present

## 2022-06-01 DIAGNOSIS — R278 Other lack of coordination: Secondary | ICD-10-CM | POA: Diagnosis not present

## 2022-06-01 DIAGNOSIS — Z471 Aftercare following joint replacement surgery: Secondary | ICD-10-CM | POA: Diagnosis not present

## 2022-06-04 ENCOUNTER — Ambulatory Visit: Payer: Medicare PPO | Admitting: Physician Assistant

## 2022-06-05 ENCOUNTER — Ambulatory Visit (INDEPENDENT_AMBULATORY_CARE_PROVIDER_SITE_OTHER): Payer: Medicare PPO

## 2022-06-05 ENCOUNTER — Encounter: Payer: Self-pay | Admitting: Orthopaedic Surgery

## 2022-06-05 ENCOUNTER — Ambulatory Visit (INDEPENDENT_AMBULATORY_CARE_PROVIDER_SITE_OTHER): Payer: Medicare PPO | Admitting: Orthopaedic Surgery

## 2022-06-05 DIAGNOSIS — M25552 Pain in left hip: Secondary | ICD-10-CM | POA: Diagnosis not present

## 2022-06-05 DIAGNOSIS — Z96651 Presence of right artificial knee joint: Secondary | ICD-10-CM

## 2022-06-05 MED ORDER — LIDOCAINE HCL 1 % IJ SOLN
3.0000 mL | INTRAMUSCULAR | Status: AC | PRN
  Administered 2022-06-05: 3 mL

## 2022-06-05 MED ORDER — METHYLPREDNISOLONE ACETATE 40 MG/ML IJ SUSP
40.0000 mg | INTRAMUSCULAR | Status: AC | PRN
  Administered 2022-06-05: 40 mg via INTRA_ARTICULAR

## 2022-06-05 NOTE — Progress Notes (Signed)
The patient is a 82 year old female who is now 6 weeks status post a right total knee arthroplasty and is doing great with the right knee.  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  She has been having pain of her left hip and knees is evaluated today.  It is on the lateral aspect of her hip as a source of her pain and somewhat in the sciatic region but there is no sciatic symptoms.  She is already ambulating without an assistive device.  She has been released from therapy as well.  Examination of her right knee shows still some swelling to be expected but her range of motion is great.  It is almost full.  Her left knee has known valgus malalignment.  The left hip moves smoothly and fluidly with only pain over the trochanteric area.  X-rays of the pelvis and left hip show normal-appearing hips bilaterally and I shared these with her.  She has probably developed some type of hip bursitis and tendinitis because the way she walks favoring her right knee from surgery and her left known severe arthritic knee.  We did place a steroid injection over the point of maximum tenderness was tolerated well.  From my standpoint the next time I need to see her is not for 3 months unless there are issues.  Will have a standing AP and lateral of her right operative knee at that visit.       Procedure Note  Patient: Lindsay Clark             Date of Birth: 1940-12-25           MRN: [id number]             Visit Date: 06/05/2022  Procedures: Visit Diagnoses:  1. Pain of left hip   2. Status post total right knee replacement     Large Joint Inj: L greater trochanter on 06/05/2022 10:36 AM Indications: pain and diagnostic evaluation Details: 22 G 1.5 in needle, lateral approach  Arthrogram: No  Medications: 3 mL lidocaine 1 %; 40 mg methylPREDNISolone acetate 40 MG/ML Outcome: tolerated well, no immediate complications Procedure, treatment alternatives, risks and benefits explained, specific  risks discussed. Consent was given by the patient. Immediately prior to procedure a time out was called to verify the correct patient, procedure, equipment, support staff and site/side marked as required. Patient was prepped and draped in the usual sterile fashion.

## 2022-06-06 ENCOUNTER — Ambulatory Visit: Payer: Medicare PPO | Admitting: Orthopaedic Surgery

## 2022-06-14 DIAGNOSIS — L932 Other local lupus erythematosus: Secondary | ICD-10-CM | POA: Diagnosis not present

## 2022-06-14 DIAGNOSIS — E669 Obesity, unspecified: Secondary | ICD-10-CM | POA: Diagnosis not present

## 2022-06-14 DIAGNOSIS — Z6835 Body mass index (BMI) 35.0-35.9, adult: Secondary | ICD-10-CM | POA: Diagnosis not present

## 2022-06-14 DIAGNOSIS — M1991 Primary osteoarthritis, unspecified site: Secondary | ICD-10-CM | POA: Diagnosis not present

## 2022-06-14 DIAGNOSIS — Z79899 Other long term (current) drug therapy: Secondary | ICD-10-CM | POA: Diagnosis not present

## 2022-06-16 LAB — LAB REPORT - SCANNED: Creatinine, POC: 37.3 mg/dL

## 2022-06-28 ENCOUNTER — Encounter: Payer: Self-pay | Admitting: *Deleted

## 2022-06-28 ENCOUNTER — Encounter: Payer: Self-pay | Admitting: Radiology

## 2022-07-12 ENCOUNTER — Other Ambulatory Visit: Payer: Self-pay | Admitting: Cardiology

## 2022-07-12 DIAGNOSIS — I480 Paroxysmal atrial fibrillation: Secondary | ICD-10-CM

## 2022-07-12 NOTE — Telephone Encounter (Signed)
Prescription refill request for Eliquis received. Indication: Afib  Last office visit: 04/09/22 ([facility])  Scr: 0.64 (04/27/22)  Age: 82 Weight: 94.2kg  Appropriate dose. Refill sent.

## 2022-07-23 DIAGNOSIS — M2142 Flat foot [pes planus] (acquired), left foot: Secondary | ICD-10-CM | POA: Diagnosis not present

## 2022-07-23 DIAGNOSIS — R2681 Unsteadiness on feet: Secondary | ICD-10-CM | POA: Diagnosis not present

## 2022-07-23 DIAGNOSIS — M2141 Flat foot [pes planus] (acquired), right foot: Secondary | ICD-10-CM | POA: Diagnosis not present

## 2022-07-23 DIAGNOSIS — Z96651 Presence of right artificial knee joint: Secondary | ICD-10-CM | POA: Diagnosis not present

## 2022-07-23 DIAGNOSIS — M5136 Other intervertebral disc degeneration, lumbar region: Secondary | ICD-10-CM | POA: Diagnosis not present

## 2022-07-23 DIAGNOSIS — B372 Candidiasis of skin and nail: Secondary | ICD-10-CM | POA: Diagnosis not present

## 2022-07-23 DIAGNOSIS — G249 Dystonia, unspecified: Secondary | ICD-10-CM | POA: Diagnosis not present

## 2022-08-07 DIAGNOSIS — M2141 Flat foot [pes planus] (acquired), right foot: Secondary | ICD-10-CM | POA: Diagnosis not present

## 2022-08-07 DIAGNOSIS — R269 Unspecified abnormalities of gait and mobility: Secondary | ICD-10-CM | POA: Diagnosis not present

## 2022-08-07 DIAGNOSIS — M79671 Pain in right foot: Secondary | ICD-10-CM | POA: Diagnosis not present

## 2022-08-07 DIAGNOSIS — M79672 Pain in left foot: Secondary | ICD-10-CM | POA: Diagnosis not present

## 2022-08-07 DIAGNOSIS — M2142 Flat foot [pes planus] (acquired), left foot: Secondary | ICD-10-CM | POA: Diagnosis not present

## 2022-08-10 ENCOUNTER — Telehealth: Payer: Self-pay | Admitting: *Deleted

## 2022-08-10 NOTE — Telephone Encounter (Signed)
Call to patient to check status since she was 90 days post op this month. She states she is very concerned b/c she was doing well at 6 weeks and now is not. She is very stiff, has a lot of discomfort at night and has lost some of her ROM. Schedulers are trying to get her in with you to go over this. I explained we could probably get her back in with some therapy, but would like for you to see her to make sure everything is ok. Thanks.

## 2022-08-14 ENCOUNTER — Encounter: Payer: Self-pay | Admitting: Orthopaedic Surgery

## 2022-08-22 ENCOUNTER — Encounter: Payer: Self-pay | Admitting: Orthopaedic Surgery

## 2022-08-22 ENCOUNTER — Other Ambulatory Visit: Payer: Self-pay

## 2022-08-22 ENCOUNTER — Other Ambulatory Visit (INDEPENDENT_AMBULATORY_CARE_PROVIDER_SITE_OTHER): Payer: Medicare PPO

## 2022-08-22 ENCOUNTER — Encounter: Payer: Self-pay | Admitting: Cardiology

## 2022-08-22 ENCOUNTER — Ambulatory Visit: Payer: Medicare PPO | Admitting: Orthopaedic Surgery

## 2022-08-22 DIAGNOSIS — Z96651 Presence of right artificial knee joint: Secondary | ICD-10-CM

## 2022-08-22 MED ORDER — METOPROLOL SUCCINATE ER 25 MG PO TB24: 12.5000 mg | ORAL_TABLET | Freq: Every day | ORAL | 3 refills | Status: DC

## 2022-08-22 NOTE — Progress Notes (Signed)
The patient is an active 82 year old female who is now several months out from a total knee arthroplasty on the right knee.  She does have known severe arthritis of her left knee and she had done very well in the immediate postoperative period after her right knee replacement with getting all of her motion back.  She feels now that right side is weaker to her and something just does not seem right to her.  She does have some issues with dermatitis and other skin issues.  On examination of the right knee her incisions healed nicely.  There is certainly a rash around the knee but it does not appear to be related to any type of metal allergy.  Her range of motion is actually really good of that knee and it feels and this is stable.  2 views of the right knee show well-seated total knee arthroplasty with no complicating features.  I gave her reassurance that the knee replacement itself is looking good but she does need some outpatient physical therapy to really work on strengthening both of her lower extremities including her hips.  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  I think having therapy really work on her bilateral lower extremity conditioning with certainly help.  She does have topical ointments at home that she says are stronger than hydrocortisone cream.  I would like her to try this on the rash on her right knee and these patchy areas.  This does not appear infected to me either.  Will see her back in 4 weeks to see how she is doing overall from a mobility standpoint.  She is talked about having her left knee replaced sometime late in the year.

## 2022-08-28 NOTE — Therapy (Signed)
OUTPATIENT PHYSICAL THERAPY EVALUATION   Patient Name: Lindsay Clark MRN: [id number] DOB:Jan 18, 1941, 82 y.o., female Today's Date: 08/29/2022  END OF SESSION:  PT End of Session - 08/29/22 1103     Visit Number 1    Number of Visits 24    Date for PT Re-Evaluation 11/21/22    Authorization Type [employer] $20 copay    Authorization - Visit Number 1    Progress Note Due on Visit 10    PT Start Time 1105    PT Stop Time 1141    PT Time Calculation (min) 36 min    Activity Tolerance Patient tolerated treatment well    Behavior During Therapy WFL for tasks assessed/performed             Past Medical History:  Diagnosis Date   Atherosclerosis    mild cerebral   Atrial fibrillation and flutter (HCC)    Autoimmune disorder (HCC)    Autoimmune encephalitis    Cervical disc disease    with spondylosis   Cervical spondylosis    Chronic dermatitis    eczematous   Colitis    COVID 10/2020   DDD (degenerative disc disease), lumbar    Frozen shoulder    left   Hyperlipidemia    Hypertension    IBS (irritable bowel syndrome)    with diarrhea Pt stated she never had this   Lateral epicondylitis    Lumbar spondylosis    Lupus (HCC)    Movement disorder    Multiple lesions on CT of brain and spine    reports lesions from previous CT scan at [facility]    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   PONV (postoperative nausea and vomiting)    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   Torn meniscus    left   Past Surgical History:  Procedure Laterality Date   A FLUTTER ABLATION  08/2020   [facility] and had cardioversion at the same time   CATARACT EXTRACTION     COLONOSCOPY     EYE SURGERY Left    HEMORRHOID BANDING     KNEE ARTHROSCOPY Left 08/09/2020   Procedure: LEFT KNEE ARTHROSCOPY MEDIAL AND LATERAL MENISECTONMY AND CONDROPLASTY;  Surgeon: Marcene Corning, MD;  Location: [facility];  Service: Orthopedics;  Laterality: Left;   LOOP RECORDER INSERTION N/A 07/05/2017    Procedure: LOOP RECORDER INSERTION;  Surgeon: Hillis Range, MD;  Location: [facility];  Service: Cardiovascular;  Laterality: N/A;   TOTAL KNEE ARTHROPLASTY Right 04/24/2022   Procedure: RIGHT TOTAL KNEE ARTHROPLASTY;  Surgeon: Kathryne Hitch, MD;  Location: [facility];  Service: Orthopedics;  Laterality: Right;   TUBAL LIGATION  1980   WISDOM TOOTH EXTRACTION     Patient Active Problem List   Diagnosis Date Noted   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/24/2022   Acquired genu valgum 10/05/2021   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   Left knee pain 08/09/2020   Persistent atrial fibrillation (HCC) 04/05/2020   Secondary hypercoagulable state (HCC) 04/05/2020   Primary osteoarthritis of left knee 09/02/2019   Prolapsed internal hemorrhoids, grade 2 03/03/2019   Brain lesion 06/24/2017   Osteoarthritis    Multiple lesions on CT of brain and spine    Movement disorder    Lumbar spondylosis    Hyperlipidemia    DDD (degenerative disc disease), lumbar    Dermatitis    Cervical spondylosis    Cervical disc disease    Diastolic  dysfunction 07/11/2016   Syncope 04/18/2016   Anxiety 04/18/2016   Hypertension    Autoimmune disorder-autoimmune encephalitis    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   Coronary artery calcification of native artery 11/04/2014   Abnormal finding on MRI of brain 10/04/2014   Pruritus 11/30/2011   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    PCP: Laurann Montana MD  REFERRING PROVIDER: Kathryne Hitch, MD  REFERRING DIAG: [phone] (ICD-10-CM) - Status post total right knee replacement  THERAPY DIAG:  Chronic pain of right knee  Muscle weakness (generalized)  Stiffness of right knee, not elsewhere classified  Difficulty in walking, not elsewhere classified  Localized edema  Rationale for Evaluation and Treatment: Rehabilitation  ONSET DATE: 04/24/2022 Rt TKA   SUBJECTIVE:   SUBJECTIVE STATEMENT: She indicated doing  well initially after surgery and had good check ups at 6 weeks.  She lives at [facility] and had therapy there.  She indicated having pain increase in April 2024 insidiously.    She indicated she has reduced exercise routine prior to symptoms.   Reported needing Lt knee replacement as well.   PERTINENT HISTORY: Cervical DDD, lumbar DDD, HTN, hyperlipidemia, OA, Lupus. History of Lt knee arthroscopy 07/2020,    History of motor coordination activity difficulty from auto immune disorders.   PAIN:  NPRS scale: at current 0/10, at worst 5/10 Pain location: Rt knee , Rt lateral, anterior proximal knee/distal quad Pain description: stiffness, discomfort Aggravating factors: transfers after prolonged sitting, night time pain Relieving factors: medicine at night  PRECAUTIONS: None  WEIGHT BEARING RESTRICTIONS: No  FALLS:  Has patient fallen in last 6 months? No  LIVING ENVIRONMENT: Lives with: lives alone Lives in: House/apartment Stairs: no stairs  OCCUPATION: Retired  PLOF: Independent, piano  PATIENT GOALS: Reduce pain  OBJECTIVE:   DIAGNOSTIC FINDINGS: Chart review on eval:  Xrays unremarkable for complications for Rt TKA.  PATIENT SURVEYS:  08/29/2022 FOTO intake:  47  predicted:  56  COGNITION: 08/29/2022 Overall cognitive status: WFL    SENSATION: 08/29/2022 Not tested  EDEMA:  08/29/2022 Mild edema noted in Rt LE  MUSCLE LENGTH: 08/29/2022 No specific testing  POSTURE: 08/29/2022 rounded shoulders and forward head  PALPATION: 08/29/2022 Mild tenderness lateral joint Rt knee, distal quad  LOWER EXTREMITY ROM:   ROM Right 08/29/2022 Left 08/29/2022  Hip flexion    Hip extension    Hip abduction    Hip adduction    Hip internal rotation    Hip external rotation    Knee flexion 103 AROM supine heel slide   Knee extension 0 AROM in seated LAQ   Ankle dorsiflexion    Ankle plantarflexion    Ankle inversion    Ankle eversion     (Blank rows = not tested)  LOWER  EXTREMITY MMT:  MMT Right 08/29/2022 Left 08/29/2022  Hip flexion 4/5 5/5  Hip extension    Hip abduction    Hip adduction    Hip internal rotation    Hip external rotation    Knee flexion 5/5 5/5  Knee extension 5/5 45, 42 lbs  5/5 54, 48 lbs  Ankle dorsiflexion 4/5 (deviation into eversion) 5/5  Ankle plantarflexion    Ankle inversion    Ankle eversion     (Blank rows = not tested)  LOWER EXTREMITY SPECIAL TESTS:  08/29/2022 No specific   FUNCTIONAL TESTS:  08/29/2022 TUG : 10.2 seconds  18 inch chair transfer: able to perform s UE assist  GAIT: 08/29/2022 Independent ambulation c antalgic gait,  deviations related to Rt leg.    TODAY'S TREATMENT                                                                          DATE:08/29/2022 Therex:    HEP instruction/performance c cues for techniques, handout provided.  Trial set performed of each for comprehension and symptom assessment.  See below for exercise list  PATIENT EDUCATION:  08/29/2022 Education details: HEP, POC Person educated: Patient Education method: Explanation, Demonstration, Verbal cues, and Handouts Education comprehension: verbalized understanding, returned demonstration, and verbal cues required  HOME EXERCISE PROGRAM: Access Code: [email or web address] URL: [email or web address] Date: 08/29/2022 Prepared by: Chyrel Masson  Exercises - Supine Heel Slide  - 3-5 x daily - 7 x weekly - 1 sets - 10 reps - 5 hold - Seated Long Arc Quad  - 3-5 x daily - 7 x weekly - 1 sets - 5-10 reps - 2 hold - Seated Knee Flexion Extension AAROM with Overpressure  - 2-3 x daily - 7 x weekly - 1 sets - 5 reps - 10 hold - Seated Quad Set  - 3-5 x daily - 7 x weekly - 1 sets - 10 reps - 5 hold - Seated Straight Leg Heel Taps  - 1-2 x daily - 7 x weekly - 3 sets - 10 reps  ASSESSMENT:  CLINICAL IMPRESSION: Patient is a 82 y.o. who comes to clinic with complaints of Rt knee pain with mobility, strength and movement  coordination deficits that impair their ability to perform usual daily and recreational functional activities without increase difficulty/symptoms at this time.  Patient to benefit from skilled PT services to address impairments and limitations to improve to previous level of function without restriction secondary to condition.   OBJECTIVE IMPAIRMENTS: decreased activity tolerance, decreased balance, decreased coordination, decreased endurance, decreased mobility, difficulty walking, decreased ROM, decreased strength, increased edema, increased fascial restrictions, impaired perceived functional ability, impaired flexibility, improper body mechanics, and pain.   ACTIVITY LIMITATIONS: carrying, lifting, bending, sitting, standing, squatting, sleeping, stairs, transfers, and locomotion level  PARTICIPATION LIMITATIONS: interpersonal relationship, shopping, and community activity  PERSONAL FACTORS: Cervical DDD, lumbar DDD, HTN, hyperlipidemia, OA, Lupus are also affecting patient's functional outcome.   REHAB POTENTIAL: Good  CLINICAL DECISION MAKING: Stable/uncomplicated  EVALUATION COMPLEXITY: Low   GOALS: Goals reviewed with patient? Yes  SHORT TERM GOALS: (target date for Short term goals are 3 weeks 09/19/2022)   1.  Patient will demonstrate independent use of home exercise program to maintain progress from in clinic treatments.  Goal status: New  LONG TERM GOALS: (target dates for all long term goals are 12 weeks  11/21/2022 )   1. Patient will demonstrate/report pain at worst less than or equal to 2/10 to facilitate minimal limitation in daily activity secondary to pain symptoms.  Goal status: New   2. Patient will demonstrate independent use of home exercise program to facilitate ability to maintain/progress functional gains from skilled physical therapy services.  Goal status: New   3. Patient will demonstrate FOTO outcome > or = 56 % to indicate reduced disability due to  condition.  Goal status: New   4.  Patient will demonstrate Rt  dynamometry for extension knee increase of 10  lbs to faciltiate usual transfers, stairs, squatting at PLOF for daily life.   Goal status: New   5.  Patient will demonstrate Rt knee AROM 0-110 deg to facilitate usual mobility, ambulation and transfers at PLOF s limitation.  Goal status: New   6.  Patient will demonstrate independent ambulation community distances > 500 ft for community integration s symptoms.  Goal status: New   7.  Patient will demonstrate ability to perform transfers during day s restriction due to symptoms.  Goal Status: New   PLAN:  PT FREQUENCY: 1-2x/week  PT DURATION: 10 weeks  PLANNED INTERVENTIONS: Therapeutic exercises, Therapeutic activity, Neuro Muscular re-education, Balance training, Gait training, Patient/Family education, Joint mobilization, Stair training, DME instructions, Dry Needling, Electrical stimulation, Traction, Cryotherapy, vasopneumatic deviceMoist heat, Taping, Ultrasound, Ionotophoresis 4mg /ml Dexamethasone, and aquatic therapy, Manual therapy.  All included unless contraindicated  PLAN FOR NEXT SESSION: Review HEP knowledge/results.    Chyrel Masson, PT, DPT, OCS, ATC 08/29/22  11:58 AM   Referring diagnosis? Z61.096 (ICD-10-CM) - Status post total right knee replacement Treatment diagnosis? (if different than referring diagnosis) M25.561 What was this (referring dx) caused by? [x]  Surgery []  Fall []  Ongoing issue []  Arthritis []  Other: ____________  Laterality: [x]  Rt []  Lt []  Both  Check all possible CPT codes:  *CHOOSE 10 OR LESS*    []  97110 (Therapeutic Exercise)  []  92507 (SLP Treatment)  []  04540 (Neuro Re-ed)   []  92526 (Swallowing Treatment)   []  97116 (Gait Training)   []  [id number] (Cognitive Training, 1st 15 minutes) []  97140 (Manual Therapy)   []  97130 (Cognitive Training, each add'l 15 minutes)  []  97164 (Re-evaluation)                               []  Other, List CPT Code ____________  []  97530 (Therapeutic Activities)     []  97535 (Self Care)   [x]  All codes above (97110 - 97535)  []  97012 (Mechanical Traction)  [x]  97014 (E-stim Unattended)  []  97032 (E-stim manual)  []  97033 (Ionto)  []  97035 (Ultrasound) [x]  97750 (Physical Performance Training) []  [id number] (Aquatic Therapy) [x]  97016 (Vasopneumatic Device) []  [id number] (Paraffin) []  97034 (Contrast Bath) []  97597 (Wound Care 1st 20 sq cm) []  97598 (Wound Care each add'l 20 sq cm) []  97760 (Orthotic Fabrication, Fitting, Training Initial) []  [id number] (Prosthetic Management and Training Initial) []  [id number] (Orthotic or Prosthetic Training/ Modification Subsequent)

## 2022-08-29 ENCOUNTER — Other Ambulatory Visit: Payer: Self-pay

## 2022-08-29 ENCOUNTER — Encounter: Payer: Self-pay | Admitting: Rehabilitative and Restorative Service Providers"

## 2022-08-29 ENCOUNTER — Ambulatory Visit: Payer: Medicare PPO | Admitting: Rehabilitative and Restorative Service Providers"

## 2022-08-29 DIAGNOSIS — M25561 Pain in right knee: Secondary | ICD-10-CM

## 2022-08-29 DIAGNOSIS — M25661 Stiffness of right knee, not elsewhere classified: Secondary | ICD-10-CM | POA: Diagnosis not present

## 2022-08-29 DIAGNOSIS — G8929 Other chronic pain: Secondary | ICD-10-CM

## 2022-08-29 DIAGNOSIS — R262 Difficulty in walking, not elsewhere classified: Secondary | ICD-10-CM | POA: Diagnosis not present

## 2022-08-29 DIAGNOSIS — R600 Localized edema: Secondary | ICD-10-CM | POA: Diagnosis not present

## 2022-08-29 DIAGNOSIS — M6281 Muscle weakness (generalized): Secondary | ICD-10-CM | POA: Diagnosis not present

## 2022-09-03 ENCOUNTER — Ambulatory Visit: Payer: Medicare PPO | Admitting: Orthopaedic Surgery

## 2022-09-04 ENCOUNTER — Other Ambulatory Visit: Payer: Self-pay | Admitting: Cardiology

## 2022-09-04 ENCOUNTER — Ambulatory Visit: Payer: Medicare PPO | Admitting: Rehabilitative and Restorative Service Providers"

## 2022-09-04 ENCOUNTER — Encounter: Payer: Self-pay | Admitting: Rehabilitative and Restorative Service Providers"

## 2022-09-04 DIAGNOSIS — M25561 Pain in right knee: Secondary | ICD-10-CM | POA: Diagnosis not present

## 2022-09-04 DIAGNOSIS — R600 Localized edema: Secondary | ICD-10-CM

## 2022-09-04 DIAGNOSIS — M25661 Stiffness of right knee, not elsewhere classified: Secondary | ICD-10-CM | POA: Diagnosis not present

## 2022-09-04 DIAGNOSIS — M6281 Muscle weakness (generalized): Secondary | ICD-10-CM | POA: Diagnosis not present

## 2022-09-04 DIAGNOSIS — G8929 Other chronic pain: Secondary | ICD-10-CM | POA: Diagnosis not present

## 2022-09-04 DIAGNOSIS — R262 Difficulty in walking, not elsewhere classified: Secondary | ICD-10-CM | POA: Diagnosis not present

## 2022-09-04 NOTE — Therapy (Signed)
OUTPATIENT PHYSICAL THERAPY TREATMENT   Patient Name: MEAGIN MCCLUNG MRN: [id number] DOB:Oct 13, 1940, 82 y.o., female Today's Date: 09/04/2022  END OF SESSION:  PT End of Session - 09/04/22 1433     Visit Number 2    Number of Visits 24    Date for PT Re-Evaluation 11/21/22    Authorization Type [employer] $20 copay    Authorization Time Period -11/21/2022    Authorization - Visit Number 2    Authorization - Number of Visits 12    Progress Note Due on Visit 10    PT Start Time 1429    PT Stop Time 1508    PT Time Calculation (min) 39 min    Activity Tolerance Patient tolerated treatment well    Behavior During Therapy WFL for tasks assessed/performed              Past Medical History:  Diagnosis Date   Atherosclerosis    mild cerebral   Atrial fibrillation and flutter (HCC)    Autoimmune disorder (HCC)    Autoimmune encephalitis    Cervical disc disease    with spondylosis   Cervical spondylosis    Chronic dermatitis    eczematous   Colitis    COVID 10/2020   DDD (degenerative disc disease), lumbar    Frozen shoulder    left   Hyperlipidemia    Hypertension    IBS (irritable bowel syndrome)    with diarrhea Pt stated she never had this   Lateral epicondylitis    Lumbar spondylosis    Lupus (HCC)    Movement disorder    Multiple lesions on CT of brain and spine    reports lesions from previous CT scan at [facility]    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   PONV (postoperative nausea and vomiting)    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   Torn meniscus    left   Past Surgical History:  Procedure Laterality Date   A FLUTTER ABLATION  08/2020   [facility] and had cardioversion at the same time   CATARACT EXTRACTION     COLONOSCOPY     EYE SURGERY Left    HEMORRHOID BANDING     KNEE ARTHROSCOPY Left 08/09/2020   Procedure: LEFT KNEE ARTHROSCOPY MEDIAL AND LATERAL MENISECTONMY AND CONDROPLASTY;  Surgeon: Marcene Corning, MD;  Location: [facility];   Service: Orthopedics;  Laterality: Left;   LOOP RECORDER INSERTION N/A 07/05/2017   Procedure: LOOP RECORDER INSERTION;  Surgeon: Hillis Range, MD;  Location: [facility];  Service: Cardiovascular;  Laterality: N/A;   TOTAL KNEE ARTHROPLASTY Right 04/24/2022   Procedure: RIGHT TOTAL KNEE ARTHROPLASTY;  Surgeon: Kathryne Hitch, MD;  Location: [facility];  Service: Orthopedics;  Laterality: Right;   TUBAL LIGATION  1980   WISDOM TOOTH EXTRACTION     Patient Active Problem List   Diagnosis Date Noted   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/24/2022   Acquired genu valgum 10/05/2021   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   Left knee pain 08/09/2020   Persistent atrial fibrillation (HCC) 04/05/2020   Secondary hypercoagulable state (HCC) 04/05/2020   Primary osteoarthritis of left knee 09/02/2019   Prolapsed internal hemorrhoids, grade 2 03/03/2019   Brain lesion 06/24/2017   Osteoarthritis    Multiple lesions on CT of brain and spine    Movement disorder    Lumbar spondylosis    Hyperlipidemia    DDD (degenerative disc disease), lumbar  Dermatitis    Cervical spondylosis    Cervical disc disease    Diastolic dysfunction 07/11/2016   Syncope 04/18/2016   Anxiety 04/18/2016   Hypertension    Autoimmune disorder-autoimmune encephalitis    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   Coronary artery calcification of native artery 11/04/2014   Abnormal finding on MRI of brain 10/04/2014   Pruritus 11/30/2011   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    PCP: Laurann Montana MD  REFERRING PROVIDER: Kathryne Hitch, MD  REFERRING DIAG: [phone] (ICD-10-CM) - Status post total right knee replacement  THERAPY DIAG:  Chronic pain of right knee  Muscle weakness (generalized)  Stiffness of right knee, not elsewhere classified  Difficulty in walking, not elsewhere classified  Localized edema  Rationale for Evaluation and Treatment: Rehabilitation  ONSET  DATE: 04/24/2022 Rt TKA   SUBJECTIVE:   SUBJECTIVE STATEMENT: She mentioned she has been very active with 2 step ladder, stairs, walking/standing.  She reported having to do a lot of cleaning.    She indicated last night having complaints in bilateral lower leg.   Reported knee pain itself didn't get worse.   PERTINENT HISTORY: Cervical DDD, lumbar DDD, HTN, hyperlipidemia, OA, Lupus. History of Lt knee arthroscopy 07/2020,    History of motor coordination activity difficulty from auto immune disorders.   PAIN:  NPRS scale: at current 0/10 Pain location: Rt knee , Rt lateral, anterior proximal knee/distal quad Pain description: stiffness, discomfort Aggravating factors: transfers after prolonged sitting, night time pain Relieving factors: medicine at night  PRECAUTIONS: None  WEIGHT BEARING RESTRICTIONS: No  FALLS:  Has patient fallen in last 6 months? No  LIVING ENVIRONMENT: Lives with: lives alone Lives in: House/apartment Stairs: no stairs  OCCUPATION: Retired  PLOF: Independent, piano  PATIENT GOALS: Reduce pain  OBJECTIVE:   DIAGNOSTIC FINDINGS: Chart review on eval:  Xrays unremarkable for complications for Rt TKA.  PATIENT SURVEYS:  08/29/2022 FOTO intake:  47  predicted:  56  COGNITION: 08/29/2022 Overall cognitive status: WFL    SENSATION: 08/29/2022 Not tested  EDEMA:  08/29/2022 Mild edema noted in Rt LE  MUSCLE LENGTH: 08/29/2022 No specific testing  POSTURE: 08/29/2022 rounded shoulders and forward head  PALPATION: 08/29/2022 Mild tenderness lateral joint Rt knee, distal quad  LOWER EXTREMITY ROM:   ROM Right 08/29/2022 Left 08/29/2022  Hip flexion    Hip extension    Hip abduction    Hip adduction    Hip internal rotation    Hip external rotation    Knee flexion 103 AROM supine heel slide   Knee extension 0 AROM in seated LAQ   Ankle dorsiflexion    Ankle plantarflexion    Ankle inversion    Ankle eversion     (Blank rows = not  tested)  LOWER EXTREMITY MMT:  MMT Right 08/29/2022 Left 08/29/2022  Hip flexion 4/5 5/5  Hip extension    Hip abduction    Hip adduction    Hip internal rotation    Hip external rotation    Knee flexion 5/5 5/5  Knee extension 5/5 45, 42 lbs  5/5 54, 48 lbs  Ankle dorsiflexion 4/5 (deviation into eversion) 5/5  Ankle plantarflexion    Ankle inversion    Ankle eversion     (Blank rows = not tested)  LOWER EXTREMITY SPECIAL TESTS:  08/29/2022 No specific   FUNCTIONAL TESTS:  08/29/2022 TUG : 10.2 seconds  18 inch chair transfer: able to perform s UE assist  GAIT: 09/04/2022:  Reduced stance  on Rt leg, valgus movement at knee, tibial/foot ER in stance.   Independent.   08/29/2022 Independent ambulation c antalgic gait, deviations related to Rt leg.    TODAY'S TREATMENT                                                                          DATE:09/04/2022 Therex: Nustep lvl 5 8 mins UE/LE SLR 2 x 10 bilateral c cues for sequencing  Neuro Re-ed Feet together stance eyes open 30 sec, eyes closed 30 sec, head turns x 10 c SBA Feet together stance on foam eyes open 30 seconds, eyes closed 30 sec, head turns x 10 c SBA Alternating toe tapping in // bars on 4 inch Retro step with DF on front leg x 12 bilateral   Additional time required during intervention for techniques and education on purpose of activity.     TODAY'S TREATMENT                                                                          DATE:08/29/2022 Therex:    HEP instruction/performance c cues for techniques, handout provided.  Trial set performed of each for comprehension and symptom assessment.  See below for exercise list  PATIENT EDUCATION:  08/29/2022 Education details: HEP, POC Person educated: Patient Education method: Explanation, Demonstration, Verbal cues, and Handouts Education comprehension: verbalized understanding, returned demonstration, and verbal cues required  HOME EXERCISE  PROGRAM: Access Code: [email or web address] URL: [email or web address] Date: 08/29/2022 Prepared by: Chyrel Masson  Exercises - Supine Heel Slide  - 3-5 x daily - 7 x weekly - 1 sets - 10 reps - 5 hold - Seated Long Arc Quad  - 3-5 x daily - 7 x weekly - 1 sets - 5-10 reps - 2 hold - Seated Knee Flexion Extension AAROM with Overpressure  - 2-3 x daily - 7 x weekly - 1 sets - 5 reps - 10 hold - Seated Quad Set  - 3-5 x daily - 7 x weekly - 1 sets - 10 reps - 5 hold - Seated Straight Leg Heel Taps  - 1-2 x daily - 7 x weekly - 3 sets - 10 reps  ASSESSMENT:  CLINICAL IMPRESSION: Fair performance on standing balance activities with close guarding required to improve safety.  Ability for DF on Rt still noted with weakness but able to recruit to perform movement.  Continued skilled PT services c focus on improved LE strength, balance and movement to improve functional mobility.   OBJECTIVE IMPAIRMENTS: decreased activity tolerance, decreased balance, decreased coordination, decreased endurance, decreased mobility, difficulty walking, decreased ROM, decreased strength, increased edema, increased fascial restrictions, impaired perceived functional ability, impaired flexibility, improper body mechanics, and pain.   ACTIVITY LIMITATIONS: carrying, lifting, bending, sitting, standing, squatting, sleeping, stairs, transfers, and locomotion level  PARTICIPATION LIMITATIONS: interpersonal relationship, shopping, and community activity  PERSONAL FACTORS: Cervical DDD, lumbar DDD, HTN, hyperlipidemia, OA, Lupus are also affecting patient's functional outcome.  REHAB POTENTIAL: Good  CLINICAL DECISION MAKING: Stable/uncomplicated  EVALUATION COMPLEXITY: Low   GOALS: Goals reviewed with patient? Yes  SHORT TERM GOALS: (target date for Short term goals are 3 weeks 09/19/2022)   1.  Patient will demonstrate independent use of home exercise program to maintain progress from in clinic  treatments.  Goal status: on going 09/04/2022  LONG TERM GOALS: (target dates for all long term goals are 12 weeks  11/21/2022 )   1. Patient will demonstrate/report pain at worst less than or equal to 2/10 to facilitate minimal limitation in daily activity secondary to pain symptoms.  Goal status: New   2. Patient will demonstrate independent use of home exercise program to facilitate ability to maintain/progress functional gains from skilled physical therapy services.  Goal status: New   3. Patient will demonstrate FOTO outcome > or = 56 % to indicate reduced disability due to condition.  Goal status: New   4.  Patient will demonstrate Rt  dynamometry for extension knee increase of 10 lbs to faciltiate usual transfers, stairs, squatting at PLOF for daily life.   Goal status: New   5.  Patient will demonstrate Rt knee AROM 0-110 deg to facilitate usual mobility, ambulation and transfers at PLOF s limitation.  Goal status: New   6.  Patient will demonstrate independent ambulation community distances > 500 ft for community integration s symptoms.  Goal status: New   7.  Patient will demonstrate ability to perform transfers during day s restriction due to symptoms.  Goal Status: New   PLAN:  PT FREQUENCY: 1-2x/week  PT DURATION: 10 weeks  PLANNED INTERVENTIONS: Therapeutic exercises, Therapeutic activity, Neuro Muscular re-education, Balance training, Gait training, Patient/Family education, Joint mobilization, Stair training, DME instructions, Dry Needling, Electrical stimulation, Traction, Cryotherapy, vasopneumatic deviceMoist heat, Taping, Ultrasound, Ionotophoresis 4mg /ml Dexamethasone, and aquatic therapy, Manual therapy.  All included unless contraindicated  PLAN FOR NEXT SESSION:  Progressive strengthening, compliant and dynamic balance.    Chyrel Masson, PT, DPT, OCS, ATC 09/04/22  3:19 PM   Referring diagnosis? Z61.096 (ICD-10-CM) - Status post total right knee  replacement Treatment diagnosis? (if different than referring diagnosis) M25.561 What was this (referring dx) caused by? [x]  Surgery []  Fall []  Ongoing issue []  Arthritis []  Other: ____________  Laterality: [x]  Rt []  Lt []  Both  Check all possible CPT codes:  *CHOOSE 10 OR LESS*    []  97110 (Therapeutic Exercise)  []  92507 (SLP Treatment)  []  97112 (Neuro Re-ed)   []  92526 (Swallowing Treatment)   []  97116 (Gait Training)   []  [id number] (Cognitive Training, 1st 15 minutes) []  97140 (Manual Therapy)   []  97130 (Cognitive Training, each add'l 15 minutes)  []  97164 (Re-evaluation)                              []  Other, List CPT Code ____________  []  97530 (Therapeutic Activities)     []  97535 (Self Care)   [x]  All codes above (97110 - 97535)  []  97012 (Mechanical Traction)  [x]  97014 (E-stim Unattended)  []  04540 (E-stim manual)  []  97033 (Ionto)  []  97035 (Ultrasound) [x]  97750 (Physical Performance Training) []  [id number] (Aquatic Therapy) [x]  97016 (Vasopneumatic Device) []  [id number] (Paraffin) []  97034 (Contrast Bath) []  97597 (Wound Care 1st 20 sq cm) []  97598 (Wound Care each add'l 20 sq cm) []  97760 (Orthotic Fabrication, Fitting, Training Initial) []  [id number] (Prosthetic Management and Training Initial) []  [id number] (Orthotic or Prosthetic  Training/ Modification Subsequent)

## 2022-09-05 DIAGNOSIS — G249 Dystonia, unspecified: Secondary | ICD-10-CM | POA: Diagnosis not present

## 2022-09-10 ENCOUNTER — Other Ambulatory Visit: Payer: Self-pay | Admitting: Cardiology

## 2022-09-10 DIAGNOSIS — R609 Edema, unspecified: Secondary | ICD-10-CM

## 2022-09-11 ENCOUNTER — Ambulatory Visit: Payer: Medicare PPO | Admitting: Rehabilitative and Restorative Service Providers"

## 2022-09-11 ENCOUNTER — Encounter: Payer: Self-pay | Admitting: Rehabilitative and Restorative Service Providers"

## 2022-09-11 DIAGNOSIS — R600 Localized edema: Secondary | ICD-10-CM | POA: Diagnosis not present

## 2022-09-11 DIAGNOSIS — G8929 Other chronic pain: Secondary | ICD-10-CM | POA: Diagnosis not present

## 2022-09-11 DIAGNOSIS — M25561 Pain in right knee: Secondary | ICD-10-CM

## 2022-09-11 DIAGNOSIS — R262 Difficulty in walking, not elsewhere classified: Secondary | ICD-10-CM

## 2022-09-11 DIAGNOSIS — M6281 Muscle weakness (generalized): Secondary | ICD-10-CM

## 2022-09-11 DIAGNOSIS — M25661 Stiffness of right knee, not elsewhere classified: Secondary | ICD-10-CM | POA: Diagnosis not present

## 2022-09-11 NOTE — Therapy (Signed)
OUTPATIENT PHYSICAL THERAPY TREATMENT   Patient Name: Lindsay Clark MRN: [id number] DOB:March 01, 1941, 82 y.o., female Today's Date: 09/11/2022  END OF SESSION:  PT End of Session - 09/11/22 1156     Visit Number 3    Number of Visits 24    Date for PT Re-Evaluation 11/21/22    Authorization Type [employer] $20 copay    Authorization Time Period -11/21/2022    Authorization - Visit Number 3    Authorization - Number of Visits 12    Progress Note Due on Visit 10    PT Start Time 1149    PT Stop Time 1227    PT Time Calculation (min) 38 min    Activity Tolerance Patient tolerated treatment well    Behavior During Therapy WFL for tasks assessed/performed               Past Medical History:  Diagnosis Date   Atherosclerosis    mild cerebral   Atrial fibrillation and flutter (HCC)    Autoimmune disorder (HCC)    Autoimmune encephalitis    Cervical disc disease    with spondylosis   Cervical spondylosis    Chronic dermatitis    eczematous   Colitis    COVID 10/2020   DDD (degenerative disc disease), lumbar    Frozen shoulder    left   Hyperlipidemia    Hypertension    IBS (irritable bowel syndrome)    with diarrhea Pt stated she never had this   Lateral epicondylitis    Lumbar spondylosis    Lupus (HCC)    Movement disorder    Multiple lesions on CT of brain and spine    reports lesions from previous CT scan at [facility]    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   PONV (postoperative nausea and vomiting)    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   Torn meniscus    left   Past Surgical History:  Procedure Laterality Date   A FLUTTER ABLATION  08/2020   [facility] and had cardioversion at the same time   CATARACT EXTRACTION     COLONOSCOPY     EYE SURGERY Left    HEMORRHOID BANDING     KNEE ARTHROSCOPY Left 08/09/2020   Procedure: LEFT KNEE ARTHROSCOPY MEDIAL AND LATERAL MENISECTONMY AND CONDROPLASTY;  Surgeon: Marcene Corning, MD;  Location: [facility];   Service: Orthopedics;  Laterality: Left;   LOOP RECORDER INSERTION N/A 07/05/2017   Procedure: LOOP RECORDER INSERTION;  Surgeon: Hillis Range, MD;  Location: [facility];  Service: Cardiovascular;  Laterality: N/A;   TOTAL KNEE ARTHROPLASTY Right 04/24/2022   Procedure: RIGHT TOTAL KNEE ARTHROPLASTY;  Surgeon: Kathryne Hitch, MD;  Location: [facility];  Service: Orthopedics;  Laterality: Right;   TUBAL LIGATION  1980   WISDOM TOOTH EXTRACTION     Patient Active Problem List   Diagnosis Date Noted   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/24/2022   Acquired genu valgum 10/05/2021   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   Left knee pain 08/09/2020   Persistent atrial fibrillation (HCC) 04/05/2020   Secondary hypercoagulable state (HCC) 04/05/2020   Primary osteoarthritis of left knee 09/02/2019   Prolapsed internal hemorrhoids, grade 2 03/03/2019   Brain lesion 06/24/2017   Osteoarthritis    Multiple lesions on CT of brain and spine    Movement disorder    Lumbar spondylosis    Hyperlipidemia    DDD (degenerative disc disease), lumbar  Dermatitis    Cervical spondylosis    Cervical disc disease    Diastolic dysfunction 07/11/2016   Syncope 04/18/2016   Anxiety 04/18/2016   Hypertension    Autoimmune disorder-autoimmune encephalitis    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   Coronary artery calcification of native artery 11/04/2014   Abnormal finding on MRI of brain 10/04/2014   Pruritus 11/30/2011   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    PCP: Laurann Montana MD  REFERRING PROVIDER: Kathryne Hitch, MD  REFERRING DIAG: [phone] (ICD-10-CM) - Status post total right knee replacement  THERAPY DIAG:  Chronic pain of right knee  Muscle weakness (generalized)  Stiffness of right knee, not elsewhere classified  Difficulty in walking, not elsewhere classified  Localized edema  Rationale for Evaluation and Treatment: Rehabilitation  ONSET  DATE: 04/24/2022 Rt TKA   SUBJECTIVE:   SUBJECTIVE STATEMENT: She indicated having afib occurences noted this weekend.  Had some symptomatic response but recovered quickly.  She reported having some back of leg complaints at times as mentioned in last visit.   PERTINENT HISTORY: Cervical DDD, lumbar DDD, HTN, hyperlipidemia, OA, Lupus. History of Lt knee arthroscopy 07/2020,    History of motor coordination activity difficulty from auto immune disorders.   PAIN:  NPRS scale: at current 0/10 Pain location: Rt knee , Rt lateral, anterior proximal knee/distal quad Pain description: stiffness, discomfort Aggravating factors: transfers after prolonged sitting, night time pain Relieving factors: medicine at night  PRECAUTIONS: None  WEIGHT BEARING RESTRICTIONS: No  FALLS:  Has patient fallen in last 6 months? No  LIVING ENVIRONMENT: Lives with: lives alone Lives in: House/apartment Stairs: no stairs  OCCUPATION: Retired  PLOF: Independent, piano  PATIENT GOALS: Reduce pain  OBJECTIVE:   DIAGNOSTIC FINDINGS: Chart review on eval:  Xrays unremarkable for complications for Rt TKA.  PATIENT SURVEYS:  08/29/2022 FOTO intake:  47  predicted:  56  COGNITION: 08/29/2022 Overall cognitive status: WFL    SENSATION: 08/29/2022 Not tested  EDEMA:  08/29/2022 Mild edema noted in Rt LE  MUSCLE LENGTH: 08/29/2022 No specific testing  POSTURE: 08/29/2022 rounded shoulders and forward head  PALPATION: 08/29/2022 Mild tenderness lateral joint Rt knee, distal quad  LOWER EXTREMITY ROM:   ROM Right 08/29/2022 Left 08/29/2022  Hip flexion    Hip extension    Hip abduction    Hip adduction    Hip internal rotation    Hip external rotation    Knee flexion 103 AROM supine heel slide   Knee extension 0 AROM in seated LAQ   Ankle dorsiflexion    Ankle plantarflexion    Ankle inversion    Ankle eversion     (Blank rows = not tested)  LOWER EXTREMITY MMT:  MMT Right 08/29/2022  Left 08/29/2022  Hip flexion 4/5 5/5  Hip extension    Hip abduction    Hip adduction    Hip internal rotation    Hip external rotation    Knee flexion 5/5 5/5  Knee extension 5/5 45, 42 lbs  5/5 54, 48 lbs  Ankle dorsiflexion 4/5 (deviation into eversion) 5/5  Ankle plantarflexion    Ankle inversion    Ankle eversion     (Blank rows = not tested)  LOWER EXTREMITY SPECIAL TESTS:  08/29/2022 No specific   FUNCTIONAL TESTS:  08/29/2022 TUG : 10.2 seconds  18 inch chair transfer: able to perform s UE assist  GAIT: 09/04/2022:  Reduced stance on Rt leg, valgus movement at knee, tibial/foot ER in stance.  Independent.   08/29/2022 Independent ambulation c antalgic gait, deviations related to Rt leg.   TODAY'S TREATMENT                                                                          DATE:09/11/2022 96 % pulse ox upon arrival today  Therex: Nustep lvl 5 10 mins UE/LE Step on over and down 4 inch step x 10 each leg WB with minimal hand assist in // bars  Leg press double leg 68 lbs x 15, single leg, performed bilateraly  x 15 31 lbs  - slow movement focus  Neuro Re-ed Alt hee/toe lifts standing with light hand assist on bar x 15   TODAY'S TREATMENT                                                                          DATE:09/04/2022 Therex: Nustep lvl 5 8 mins UE/LE SLR 2 x 10 bilateral c cues for sequencing  Neuro Re-ed Feet together stance eyes open 30 sec, eyes closed 30 sec, head turns x 10 c SBA Feet together stance on foam eyes open 30 seconds, eyes closed 30 sec, head turns x 10 c SBA Alternating toe tapping in // bars on 4 inch Retro step with DF on front leg x 12 bilateral   Additional time required during intervention for techniques and education on purpose of activity.     TODAY'S TREATMENT                                                                          DATE:08/29/2022 Therex:    HEP instruction/performance c cues for techniques, handout  provided.  Trial set performed of each for comprehension and symptom assessment.  See below for exercise list  PATIENT EDUCATION:  08/29/2022 Education details: HEP, POC Person educated: Patient Education method: Explanation, Demonstration, Verbal cues, and Handouts Education comprehension: verbalized understanding, returned demonstration, and verbal cues required  HOME EXERCISE PROGRAM: Access Code: [email or web address] URL: [email or web address] Date: 08/29/2022 Prepared by: Chyrel Masson  Exercises - Supine Heel Slide  - 3-5 x daily - 7 x weekly - 1 sets - 10 reps - 5 hold - Seated Long Arc Quad  - 3-5 x daily - 7 x weekly - 1 sets - 5-10 reps - 2 hold - Seated Knee Flexion Extension AAROM with Overpressure  - 2-3 x daily - 7 x weekly - 1 sets - 5 reps - 10 hold - Seated Quad Set  - 3-5 x daily - 7 x weekly - 1 sets - 10 reps - 5 hold - Seated Straight Leg Heel Taps  - 1-2 x daily - 7 x weekly - 3 sets -  10 reps  ASSESSMENT:  CLINICAL IMPRESSION: Pt to continue to benefit from skilled PT services to improve LE strength and control for improved ambulation and stability.  Good tolerance to additional strengthening activity today.   OBJECTIVE IMPAIRMENTS: decreased activity tolerance, decreased balance, decreased coordination, decreased endurance, decreased mobility, difficulty walking, decreased ROM, decreased strength, increased edema, increased fascial restrictions, impaired perceived functional ability, impaired flexibility, improper body mechanics, and pain.   ACTIVITY LIMITATIONS: carrying, lifting, bending, sitting, standing, squatting, sleeping, stairs, transfers, and locomotion level  PARTICIPATION LIMITATIONS: interpersonal relationship, shopping, and community activity  PERSONAL FACTORS: Cervical DDD, lumbar DDD, HTN, hyperlipidemia, OA, Lupus are also affecting patient's functional outcome.   REHAB POTENTIAL: Good  CLINICAL DECISION MAKING:  Stable/uncomplicated  EVALUATION COMPLEXITY: Low   GOALS: Goals reviewed with patient? Yes  SHORT TERM GOALS: (target date for Short term goals are 3 weeks 09/19/2022)   1.  Patient will demonstrate independent use of home exercise program to maintain progress from in clinic treatments.  Goal status: on going 09/04/2022  LONG TERM GOALS: (target dates for all long term goals are 12 weeks  11/21/2022 )   1. Patient will demonstrate/report pain at worst less than or equal to 2/10 to facilitate minimal limitation in daily activity secondary to pain symptoms.  Goal status: New   2. Patient will demonstrate independent use of home exercise program to facilitate ability to maintain/progress functional gains from skilled physical therapy services.  Goal status: New   3. Patient will demonstrate FOTO outcome > or = 56 % to indicate reduced disability due to condition.  Goal status: New   4.  Patient will demonstrate Rt  dynamometry for extension knee increase of 10 lbs to faciltiate usual transfers, stairs, squatting at PLOF for daily life.   Goal status: New   5.  Patient will demonstrate Rt knee AROM 0-110 deg to facilitate usual mobility, ambulation and transfers at PLOF s limitation.  Goal status: New   6.  Patient will demonstrate independent ambulation community distances > 500 ft for community integration s symptoms.  Goal status: New   7.  Patient will demonstrate ability to perform transfers during day s restriction due to symptoms.  Goal Status: New   PLAN:  PT FREQUENCY: 1-2x/week  PT DURATION: 10 weeks  PLANNED INTERVENTIONS: Therapeutic exercises, Therapeutic activity, Neuro Muscular re-education, Balance training, Gait training, Patient/Family education, Joint mobilization, Stair training, DME instructions, Dry Needling, Electrical stimulation, Traction, Cryotherapy, vasopneumatic deviceMoist heat, Taping, Ultrasound, Ionotophoresis 4mg /ml Dexamethasone, and aquatic  therapy, Manual therapy.  All included unless contraindicated  PLAN FOR NEXT SESSION:  Strengthening /balance.    Chyrel Masson, PT, DPT, OCS, ATC 09/11/22  2:18 PM   Referring diagnosis? Z61.096 (ICD-10-CM) - Status post total right knee replacement Treatment diagnosis? (if different than referring diagnosis) M25.561 What was this (referring dx) caused by? [x]  Surgery []  Fall []  Ongoing issue []  Arthritis []  Other: ____________  Laterality: [x]  Rt []  Lt []  Both  Check all possible CPT codes:  *CHOOSE 10 OR LESS*    []  97110 (Therapeutic Exercise)  []  92507 (SLP Treatment)  []  97112 (Neuro Re-ed)   []  92526 (Swallowing Treatment)   []  97116 (Gait Training)   []  [id number] (Cognitive Training, 1st 15 minutes) []  97140 (Manual Therapy)   []  04540 (Cognitive Training, each add'l 15 minutes)  []  97164 (Re-evaluation)                              []   Other, List CPT Code ____________  []  97530 (Therapeutic Activities)     []  97535 (Self Care)   [x]  All codes above (97110 - 97535)  []  97012 (Mechanical Traction)  [x]  97014 (E-stim Unattended)  []  97032 (E-stim manual)  []  97033 (Ionto)  []  40981 (Ultrasound) [x]  97750 (Physical Performance Training) []  [id number] (Aquatic Therapy) [x]  97016 (Vasopneumatic Device) []  [id number] (Paraffin) []  97034 (Contrast Bath) []  97597 (Wound Care 1st 20 sq cm) []  97598 (Wound Care each add'l 20 sq cm) []  97760 (Orthotic Fabrication, Fitting, Training Initial) []  [id number] (Prosthetic Management and Training Initial) []  [id number] (Orthotic or Prosthetic Training/ Modification Subsequent)

## 2022-09-13 ENCOUNTER — Telehealth: Payer: Self-pay | Admitting: Cardiology

## 2022-09-13 NOTE — Telephone Encounter (Signed)
Patient c/o Palpitations:  High priority if patient c/o lightheadedness, shortness of breath, or chest pain  How long have you had palpitations/irregular HR/ Afib? Are you having the symptoms now?   Yes   Are you currently experiencing lightheadedness, SOB or CP?   Patient stated her oxygen has been around 98 and her head feels a "little different"  Do you have a history of afib (atrial fibrillation) or irregular heart rhythm?  Yes  Have you checked your BP or HR? (document readings if available): No.  Patient states BP and HR is not an issue  Are you experiencing any other symptoms?    Patient stated when she got up this morning her HR was 90-something. Patient stated on Sunday she was in afib and she did feel lightheaded which is unusual for her.  Patient stated at one point she was on metoprolol succinate (TOPROL-XL) 25 MG 24 hr tablet  but has been off medication since Sunday.

## 2022-09-13 NOTE — Telephone Encounter (Signed)
Patient states she stopped her metoprolol on Sunday.  She states she did this because her HR was low. She states it was 38 on her pulse ox and her apple watch showed 40.  She states that she did originally reduced the dose per message from physician, but per her "she believes the metoprolol is the reason for it dropping slow low" sop she stopped it.   HR this morning when getting up was 90 something  Current HR 63.  She is set to see Dr Elberta Fortis next week.  Now she states 114 with in a few seconds. She  She states she thinks she needs a medication because of her heart rate going up and down.  I advised that I would see if the doctor has recommendations prior to her appt with Dr Elberta Fortis next week

## 2022-09-13 NOTE — Telephone Encounter (Signed)
Spoke with pt, Aware of dr crenshaw's recommendations.  °

## 2022-09-14 ENCOUNTER — Encounter: Payer: Self-pay | Admitting: Rehabilitative and Restorative Service Providers"

## 2022-09-14 ENCOUNTER — Ambulatory Visit: Payer: Medicare PPO | Admitting: Rehabilitative and Restorative Service Providers"

## 2022-09-14 DIAGNOSIS — G8929 Other chronic pain: Secondary | ICD-10-CM

## 2022-09-14 DIAGNOSIS — R262 Difficulty in walking, not elsewhere classified: Secondary | ICD-10-CM | POA: Diagnosis not present

## 2022-09-14 DIAGNOSIS — R600 Localized edema: Secondary | ICD-10-CM | POA: Diagnosis not present

## 2022-09-14 DIAGNOSIS — M25661 Stiffness of right knee, not elsewhere classified: Secondary | ICD-10-CM | POA: Diagnosis not present

## 2022-09-14 DIAGNOSIS — M25561 Pain in right knee: Secondary | ICD-10-CM | POA: Diagnosis not present

## 2022-09-14 DIAGNOSIS — M6281 Muscle weakness (generalized): Secondary | ICD-10-CM | POA: Diagnosis not present

## 2022-09-14 NOTE — Therapy (Signed)
OUTPATIENT PHYSICAL THERAPY TREATMENT /DISCHARGE   Patient Name: Lindsay Clark MRN: [id number] DOB:[birth year], 82 y.o., female Today's Date: 09/14/2022  PHYSICAL THERAPY DISCHARGE SUMMARY  Visits from Start of Care: 4  Current functional level related to goals / functional outcomes: See note   Remaining deficits: See note   Education / Equipment: HEP  Patient goals were partially met. Patient is being discharged due to being pleased with the current functional level.    END OF SESSION:  PT End of Session - 09/14/22 1108     Visit Number 4    Number of Visits 24    Date for PT Re-Evaluation 11/21/22    Authorization Type [employer] $20 copay    Authorization Time Period -11/21/2022    Authorization - Visit Number 4    Authorization - Number of Visits 12    Progress Note Due on Visit 10    PT Start Time 1105    PT Stop Time 1133    PT Time Calculation (min) 28 min    Activity Tolerance Patient tolerated treatment well    Behavior During Therapy WFL for tasks assessed/performed                Past Medical History:  Diagnosis Date   Atherosclerosis    mild cerebral   Atrial fibrillation and flutter (HCC)    Autoimmune disorder (HCC)    Autoimmune encephalitis    Cervical disc disease    with spondylosis   Cervical spondylosis    Chronic dermatitis    eczematous   Colitis    COVID 10/2020   DDD (degenerative disc disease), lumbar    Frozen shoulder    left   Hyperlipidemia    Hypertension    IBS (irritable bowel syndrome)    with diarrhea Pt stated she never had this   Lateral epicondylitis    Lumbar spondylosis    Lupus (HCC)    Movement disorder    Multiple lesions on CT of brain and spine    reports lesions from previous CT scan at [facility]    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   PONV (postoperative nausea and vomiting)    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   Torn meniscus    left   Past Surgical History:  Procedure  Laterality Date   A FLUTTER ABLATION  08/2020   [facility] and had cardioversion at the same time   CATARACT EXTRACTION     COLONOSCOPY     EYE SURGERY Left    HEMORRHOID BANDING     KNEE ARTHROSCOPY Left 08/09/2020   Procedure: LEFT KNEE ARTHROSCOPY MEDIAL AND LATERAL MENISECTONMY AND CONDROPLASTY;  Surgeon: Marcene Corning, MD;  Location: [facility];  Service: Orthopedics;  Laterality: Left;   LOOP RECORDER INSERTION N/A 07/05/2017   Procedure: LOOP RECORDER INSERTION;  Surgeon: Hillis Range, MD;  Location: [facility];  Service: Cardiovascular;  Laterality: N/A;   TOTAL KNEE ARTHROPLASTY Right 04/24/2022   Procedure: RIGHT TOTAL KNEE ARTHROPLASTY;  Surgeon: Kathryne Hitch, MD;  Location: [facility];  Service: Orthopedics;  Laterality: Right;   TUBAL LIGATION  1980   WISDOM TOOTH EXTRACTION     Patient Active Problem List   Diagnosis Date Noted   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/24/2022   Acquired genu valgum 10/05/2021   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   Left knee pain 08/09/2020   Persistent atrial fibrillation (HCC) 04/05/2020   Secondary hypercoagulable state (HCC)  04/05/2020   Primary osteoarthritis of left knee 09/02/2019   Prolapsed internal hemorrhoids, grade 2 03/03/2019   Brain lesion 06/24/2017   Osteoarthritis    Multiple lesions on CT of brain and spine    Movement disorder    Lumbar spondylosis    Hyperlipidemia    DDD (degenerative disc disease), lumbar    Dermatitis    Cervical spondylosis    Cervical disc disease    Diastolic dysfunction 07/11/2016   Syncope 04/18/2016   Anxiety 04/18/2016   Hypertension    Autoimmune disorder-autoimmune encephalitis    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   Coronary artery calcification of native artery 11/04/2014   Abnormal finding on MRI of brain 10/04/2014   Pruritus 11/30/2011   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    PCP: Laurann Montana MD  REFERRING PROVIDER: Kathryne Hitch,  MD  REFERRING DIAG: [id number] (ICD-10-CM) - Status post total right knee replacement  THERAPY DIAG:  Chronic pain of right knee  Muscle weakness (generalized)  Stiffness of right knee, not elsewhere classified  Difficulty in walking, not elsewhere classified  Localized edema  Rationale for Evaluation and Treatment: Rehabilitation  ONSET DATE: 04/24/2022 Rt TKA   SUBJECTIVE:   SUBJECTIVE STATEMENT: She indicated from hips to knees the muscles get sore as the day goes.  She indicated going from 1 to 2nd floor by steps with improvements.   PERTINENT HISTORY: Cervical DDD, lumbar DDD, HTN, hyperlipidemia, OA, Lupus. History of Lt knee arthroscopy 07/2020,    History of motor coordination activity difficulty from auto immune disorders.   PAIN:  NPRS scale: at current 0/10 upon arrival Pain location: Rt knee , Rt lateral, anterior proximal knee/distal quad Pain description: stiffness, discomfort Aggravating factors: transfers after prolonged sitting, night time pain Relieving factors: medicine at night  PRECAUTIONS: None  WEIGHT BEARING RESTRICTIONS: No  FALLS:  Has patient fallen in last 6 months? No  LIVING ENVIRONMENT: Lives with: lives alone Lives in: House/apartment Stairs: no stairs  OCCUPATION: Retired  PLOF: Independent, piano  PATIENT GOALS: Reduce pain  OBJECTIVE:   DIAGNOSTIC FINDINGS: Chart review on eval:  Xrays unremarkable for complications for Rt TKA.  PATIENT SURVEYS:  09/14/2022:  FOTO 52  08/29/2022 FOTO intake:  47  predicted:  56  COGNITION: 08/29/2022 Overall cognitive status: WFL    SENSATION: 08/29/2022 Not tested  EDEMA:  08/29/2022 Mild edema noted in Rt LE  MUSCLE LENGTH: 08/29/2022 No specific testing  POSTURE: 08/29/2022 rounded shoulders and forward head  PALPATION: 08/29/2022 Mild tenderness lateral joint Rt knee, distal quad  LOWER EXTREMITY ROM:   ROM Right 08/29/2022 Left 08/29/2022  Hip flexion    Hip extension    Hip  abduction    Hip adduction    Hip internal rotation    Hip external rotation    Knee flexion 103 AROM supine heel slide 110 AROM supine heel slide  Knee extension 0 AROM in seated LAQ 0  Ankle dorsiflexion    Ankle plantarflexion    Ankle inversion    Ankle eversion     (Blank rows = not tested)  LOWER EXTREMITY MMT:  MMT Right 08/29/2022 Left 08/29/2022 Right 09/14/2022  Hip flexion 4/5 5/5   Hip extension     Hip abduction     Hip adduction     Hip internal rotation     Hip external rotation     Knee flexion 5/5 5/5   Knee extension 5/5 45, 42 lbs  5/5 54, 48 lbs 43, 43 lbs  Ankle dorsiflexion 4/5 (deviation into eversion) 5/5   Ankle plantarflexion     Ankle inversion     Ankle eversion      (Blank rows = not tested)  LOWER EXTREMITY SPECIAL TESTS:  08/29/2022 No specific   FUNCTIONAL TESTS:  08/29/2022 TUG : 10.2 seconds  18 inch chair transfer: able to perform s UE assist  GAIT: 09/04/2022:  Reduced stance on Rt leg, valgus movement at knee, tibial/foot ER in stance.   Independent.   08/29/2022 Independent ambulation c antalgic gait, deviations related to Rt leg.   TODAY'S TREATMENT                                                                          DATE: 09/14/2022 Therex: Nustep lvl 6 10 mins UE/LE Review of existing HEP c handout provided.  Verbal cues and visual demonstration of additions to improve knowledge for successful use at home.  Question and answer time given to address patient questions.  Additional time spent in discussion.    TODAY'S TREATMENT                                                                          DATE:09/11/2022 96 % pulse ox upon arrival today  Therex: Nustep lvl 5 10 mins UE/LE Step on over and down 4 inch step x 10 each leg WB with minimal hand assist in // bars  Leg press double leg 68 lbs x 15, single leg, performed bilateraly  x 15 31 lbs  - slow movement focus  Neuro Re-ed Alt hee/toe lifts standing with light hand  assist on bar x 15   TODAY'S TREATMENT                                                                          DATE:09/04/2022 Therex: Nustep lvl 5 8 mins UE/LE SLR 2 x 10 bilateral c cues for sequencing  Neuro Re-ed Feet together stance eyes open 30 sec, eyes closed 30 sec, head turns x 10 c SBA Feet together stance on foam eyes open 30 seconds, eyes closed 30 sec, head turns x 10 c SBA Alternating toe tapping in // bars on 4 inch Retro step with DF on front leg x 12 bilateral   Additional time required during intervention for techniques and education on purpose of activity.   PATIENT EDUCATION:  08/29/2022 Education details: HEP, POC Person educated: Patient Education method: Programmer, multimedia, Demonstration, Verbal cues, and Handouts Education comprehension: verbalized understanding, returned demonstration, and verbal cues required  HOME EXERCISE PROGRAM: Access Code: [email or web address] URL: [email or web address] Date: 09/14/2022 Prepared by: Chyrel Masson  Exercises - Supine Heel Slide  - 3-5 x daily - 7  x weekly - 1 sets - 10 reps - 5 hold - Seated Long Arc Quad  - 3-5 x daily - 7 x weekly - 1 sets - 5-10 reps - 2 hold - Seated Knee Flexion Extension AAROM with Overpressure  - 2-3 x daily - 7 x weekly - 1 sets - 5 reps - 10 hold - Seated Quad Set  - 3-5 x daily - 7 x weekly - 1 sets - 10 reps - 5 hold - Seated Straight Leg Heel Taps  - 1-2 x daily - 7 x weekly - 3 sets - 10 reps - Semi-Tandem Corner Balance With Eyes Open  - 1 x daily - 7 x weekly - 1 sets - 2-3 reps - 30 hold - Corner Balance Feet Together: Eyes Open With Head Turns  - 1 x daily - 7 x weekly - 1 sets - 2-3 reps - 30 hold - Semi-Tandem Corner Balance: Eyes Open With Head Turns  - 1 x daily - 7 x weekly - 1 sets - 2-3 reps - 30 hold - Sit to Stand  - 3 x daily - 7 x weekly - 1 sets - 10 reps  ASSESSMENT:  CLINICAL IMPRESSION: Pt attended 4 visits overall during treatment.  FOTO improvement noted in  functional activity assessment.  Pt had balance/strength deficits from combination of past medical history neurologically with some impact from recent TKA.   Pt has living environment with access to some gym based equipment and was also provided with daily HEP to help assess impairments.  Pt was in agreement with transition to HEP at this time.   OBJECTIVE IMPAIRMENTS: decreased activity tolerance, decreased balance, decreased coordination, decreased endurance, decreased mobility, difficulty walking, decreased ROM, decreased strength, increased edema, increased fascial restrictions, impaired perceived functional ability, impaired flexibility, improper body mechanics, and pain.   ACTIVITY LIMITATIONS: carrying, lifting, bending, sitting, standing, squatting, sleeping, stairs, transfers, and locomotion level  PARTICIPATION LIMITATIONS: interpersonal relationship, shopping, and community activity  PERSONAL FACTORS: Cervical DDD, lumbar DDD, HTN, hyperlipidemia, OA, Lupus are also affecting patient's functional outcome.   REHAB POTENTIAL: Good  CLINICAL DECISION MAKING: Stable/uncomplicated  EVALUATION COMPLEXITY: Low   GOALS: Goals reviewed with patient? Yes  SHORT TERM GOALS: (target date for Short term goals are 3 weeks 09/19/2022)   1.  Patient will demonstrate independent use of home exercise program to maintain progress from in clinic treatments.  Goal status: on going 09/04/2022  LONG TERM GOALS: (target dates for all long term goals are 12 weeks  11/21/2022 )   1. Patient will demonstrate/report pain at worst less than or equal to 2/10 to facilitate minimal limitation in daily activity secondary to pain symptoms.  Goal status: Met   2. Patient will demonstrate independent use of home exercise program to facilitate ability to maintain/progress functional gains from skilled physical therapy services.  Goal status: Met   3. Patient will demonstrate FOTO outcome > or = 56 % to  indicate reduced disability due to condition.  Goal status: partially met   4.  Patient will demonstrate Rt  dynamometry for extension knee increase of 10 lbs to faciltiate usual transfers, stairs, squatting at PLOF for daily life.   Goal status: partially met   5.  Patient will demonstrate Rt knee AROM 0-110 deg to facilitate usual mobility, ambulation and transfers at PLOF s limitation.  Goal status: Met   6.  Patient will demonstrate independent ambulation community distances > 500 ft for community integration s symptoms.  Goal status: partially met   7.  Patient will demonstrate ability to perform transfers during day s restriction due to symptoms.  Goal Status: mostly met   PLAN:  PT FREQUENCY: 1-2x/week  PT DURATION: 10 weeks  PLANNED INTERVENTIONS: Therapeutic exercises, Therapeutic activity, Neuro Muscular re-education, Balance training, Gait training, Patient/Family education, Joint mobilization, Stair training, DME instructions, Dry Needling, Electrical stimulation, Traction, Cryotherapy, vasopneumatic deviceMoist heat, Taping, Ultrasound, Ionotophoresis 4mg /ml Dexamethasone, and aquatic therapy, Manual therapy.  All included unless contraindicated  PLAN FOR NEXT SESSION:  Discharge   Chyrel Masson, PT, DPT, OCS, ATC 09/14/22  11:46 AM   Referring diagnosis? U98.119 (ICD-10-CM) - Status post total right knee replacement Treatment diagnosis? (if different than referring diagnosis) M25.561 What was this (referring dx) caused by? [x]  Surgery []  Fall []  Ongoing issue []  Arthritis []  Other: ____________  Laterality: [x]  Rt []  Lt []  Both  Check all possible CPT codes:  *CHOOSE 10 OR LESS*    []  97110 (Therapeutic Exercise)  []  92507 (SLP Treatment)  []  97112 (Neuro Re-ed)   []  92526 (Swallowing Treatment)   []  97116 (Gait Training)   []  [id number] (Cognitive Training, 1st 15 minutes) []  97140 (Manual Therapy)   []  97130 (Cognitive Training, each add'l 15  minutes)  []  97164 (Re-evaluation)                              []  Other, List CPT Code ____________  []  97530 (Therapeutic Activities)     []  97535 (Self Care)   [x]  All codes above (97110 - 97535)  []  97012 (Mechanical Traction)  [x]  14782 (E-stim Unattended)  []  97032 (E-stim manual)  []  97033 (Ionto)  []  97035 (Ultrasound) [x]  97750 (Physical Performance Training) []  [id number] (Aquatic Therapy) [x]  97016 (Vasopneumatic Device) []  [id number] (Paraffin) []  97034 (Contrast Bath) []  97597 (Wound Care 1st 20 sq cm) []  97598 (Wound Care each add'l 20 sq cm) []  97760 (Orthotic Fabrication, Fitting, Training Initial) []  [id number] (Prosthetic Management and Training Initial) []  [id number] (Orthotic or Prosthetic Training/ Modification Subsequent)

## 2022-09-18 ENCOUNTER — Encounter: Payer: Medicare PPO | Admitting: Physical Therapy

## 2022-09-19 ENCOUNTER — Ambulatory Visit: Payer: Medicare PPO | Attending: Cardiology | Admitting: Cardiology

## 2022-09-19 ENCOUNTER — Ambulatory Visit: Payer: Medicare PPO | Admitting: Orthopaedic Surgery

## 2022-09-19 ENCOUNTER — Telehealth: Payer: Self-pay | Admitting: Cardiology

## 2022-09-19 ENCOUNTER — Encounter: Payer: Self-pay | Admitting: Cardiology

## 2022-09-19 VITALS — BP 116/72 | HR 82 | Ht 64.0 in | Wt 210.0 lb

## 2022-09-19 DIAGNOSIS — I2510 Atherosclerotic heart disease of native coronary artery without angina pectoris: Secondary | ICD-10-CM

## 2022-09-19 DIAGNOSIS — M25562 Pain in left knee: Secondary | ICD-10-CM | POA: Diagnosis not present

## 2022-09-19 DIAGNOSIS — D6869 Other thrombophilia: Secondary | ICD-10-CM

## 2022-09-19 DIAGNOSIS — I10 Essential (primary) hypertension: Secondary | ICD-10-CM

## 2022-09-19 DIAGNOSIS — M1712 Unilateral primary osteoarthritis, left knee: Secondary | ICD-10-CM

## 2022-09-19 DIAGNOSIS — Z96651 Presence of right artificial knee joint: Secondary | ICD-10-CM | POA: Diagnosis not present

## 2022-09-19 DIAGNOSIS — I4819 Other persistent atrial fibrillation: Secondary | ICD-10-CM

## 2022-09-19 DIAGNOSIS — G8929 Other chronic pain: Secondary | ICD-10-CM

## 2022-09-19 MED ORDER — PROPAFENONE HCL ER 225 MG PO CP12: 225.0000 mg | ORAL_CAPSULE | Freq: Two times a day (BID) | ORAL | 6 refills | Status: DC

## 2022-09-19 NOTE — Telephone Encounter (Signed)
Left message for pt to call back  °

## 2022-09-19 NOTE — Progress Notes (Signed)
The patient is well-known to me.  We replaced her right knee in January of this year.  She has been going through extensive therapy and her range of motion is now improving significantly and she is having still some discomfort and stiffness with the right knee but its gotten better significantly.  Her left knee has severe well-documented end-stage arthritis with valgus malalignment.  She is at the point of wishing to proceed with a left knee replacement.  We agree with this as well.  That was the knee we were going originally replaced with and her right knee got bad enough.  She is ambulating without assistive device.  She turns 82 in a few weeks.  Her husband is with her today as well.  Her right operative knee is nice and straight.  Extension is almost full and I can flex her to past 110 degrees today.  Her left knee has a valgus malalignment and painful arc of motion with a lot of IT band pain as well.  There is medial and lateral joint line tenderness and significant crepitation of that left knee.  Of note she has had multiple interventions of that left knee including steroid injections, hyaluronic acid injections and arthroscopic surgery and all this is failed at this point.  We will work on getting her scheduled for a left total knee arthroplasty in the future.  We will have her stop her Eliquis for 3 full days prior to surgery.  All question concerns were answered and addressed.  Having this done before she is fully aware of the risks and benefits of surgery.

## 2022-09-19 NOTE — Telephone Encounter (Signed)
Pt c/o medication issue:  1. Name of Medication: propafenone (RYTHMOL SR) 225 MG 12 hr capsule  hydroxychloroquine (PLAQUENIL) 200 MG tablet   2. How are you currently taking this medication (dosage and times per day)?   3. Are you having a reaction (difficulty breathing--STAT)?   4. What is your medication issue? Pharmacy is calling to make sure the doctor was aware of drug interaction between these two medications before filling. Requesting call back to discuss.

## 2022-09-19 NOTE — Progress Notes (Signed)
Electrophysiology Office Note   Date:  09/19/2022   ID:  Lindsay Clark, DOB Mar 15, 1941, MRN [id number]  PCP:  Laurann Montana, MD  Cardiologist:  Jens Som Primary Electrophysiologist:  Antinette Keough Jorja Loa, MD    Chief Complaint: AF   History of Present Illness: Lindsay Clark is a 82 y.o. female who is being seen today for the evaluation of AF at the request of Laurann Montana, MD. Presenting today for electrophysiology evaluation.  She has a history significant for atrial fibrillation/flutter, hypertension, hyperlipidemia.  She has a loop recorder implanted for syncope and palpitations.  She had atrial fibrillation ablation May 2022 at [facility].  Today, she denies symptoms of palpitations, chest pain, shortness of breath, orthopnea, PND, lower extremity edema, claudication, dizziness, presyncope, syncope, bleeding, or neurologic sequela. The patient is tolerating medications without difficulties.  Over the last few months she has had more frequent episodes of atrial fibrillation.  She feels a strange sensation in her in her head and possibly fatigue.  She did have knee surgery last winter and she feels that her episodes have started since surgery.  She would prefer to avoid long-term medications.   Past Medical History:  Diagnosis Date   Atherosclerosis    mild cerebral   Atrial fibrillation and flutter (HCC)    Autoimmune disorder (HCC)    Autoimmune encephalitis    Cervical disc disease    with spondylosis   Cervical spondylosis    Chronic dermatitis    eczematous   Colitis    COVID 10/2020   DDD (degenerative disc disease), lumbar    Frozen shoulder    left   Hyperlipidemia    Hypertension    IBS (irritable bowel syndrome)    with diarrhea Pt stated she never had this   Lateral epicondylitis    Lumbar spondylosis    Lupus (HCC)    Movement disorder    Multiple lesions on CT of brain and spine    reports lesions from previous CT scan at [facility]    Osteoarthritis    in  her hands   PONV (postoperative nausea and vomiting)    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   Torn meniscus    left   Past Surgical History:  Procedure Laterality Date   A FLUTTER ABLATION  08/2020   [facility] and had cardioversion at the same time   CATARACT EXTRACTION     COLONOSCOPY     EYE SURGERY Left    HEMORRHOID BANDING     KNEE ARTHROSCOPY Left 08/09/2020   Procedure: LEFT KNEE ARTHROSCOPY MEDIAL AND LATERAL MENISECTONMY AND CONDROPLASTY;  Surgeon: Marcene Corning, MD;  Location: [facility] OR[facility];  Service: Orthopedics;  Laterality: Left;   LOOP RECORDER INSERTION N/A 07/05/2017   Procedure: LOOP RECORDER INSERTION;  Surgeon: Hillis Range, MD;  Location: [facility] INVASIVE CV LAB;  Service: Cardiovascular;  Laterality: N/A;   TOTAL KNEE ARTHROPLASTY Right 04/24/2022   Procedure: RIGHT TOTAL KNEE ARTHROPLASTY;  Surgeon: Kathryne Hitch, MD;  Location: [facility] OR;  Service: Orthopedics;  Laterality: Right;   TUBAL LIGATION  1980   WISDOM TOOTH EXTRACTION       Current Outpatient Medications  Medication Sig Dispense Refill   Cholecalciferol (VITAMIN D) 50 MCG (2000 UT) CAPS Take 2,000 Units by mouth in the morning and at bedtime.     clobetasol cream (TEMOVATE) 0.05 % Apply 1 Application topically daily as needed (itching).     ELIQUIS 5 MG TABS tablet TAKE ONE TABLET BY MOUTH  TWICE DAILY 60 tablet 5   furosemide (LASIX) 40 MG tablet Take 2 tablets (80 mg total) by mouth daily. 180 tablet 3   hydroxychloroquine (PLAQUENIL) 200 MG tablet Take 200 mg by mouth 2 (two) times daily.     methocarbamol (ROBAXIN) 500 MG tablet Take 1 tablet (500 mg total) by mouth every 6 (six) hours as needed for muscle spasms. 30 tablet 0   Multiple Vitamin (MULTIVITAMIN WITH MINERALS) TABS tablet Take 1 tablet by mouth in the morning. One-A-Day for Women 50+     Polyethyl Glycol-Propyl Glycol (SYSTANE OP) Place 1 drop into both eyes at bedtime.     propafenone (RYTHMOL SR) 225 MG 12 hr capsule Take  1 capsule (225 mg total) by mouth 2 (two) times daily. 60 capsule 6   No current facility-administered medications for this visit.    Allergies:   Amlodipine, Diltiazem, Methotrexate, Metronidazole, Other, and Quaternium-15   Social History:  The patient  reports that she has never smoked. She has never used smokeless tobacco. She reports current alcohol use of about 4.0 standard drinks of alcohol per week. She reports that she does not use drugs.   Family History:  The patient's family history includes Cancer (age of onset: 25) in her maternal grandmother; Cancer (age of onset: 5) in her mother; Colon cancer (age of onset: 56) in her mother; Emphysema in her father; Heart attack (age of onset: 12) in her father; Stroke in her mother; Thrombosis in her maternal grandfather; Thyroid disease in her maternal grandmother and son.    ROS:  Please see the history of present illness.   Otherwise, review of systems is positive for none.   All other systems are reviewed and negative.    PHYSICAL EXAM: VS:  BP 116/72   Pulse 82   Ht 5\' 4"  (1.626 m)   Wt 210 lb (95.3 kg)   SpO2 99%   BMI 36.05 kg/m  , BMI Body mass index is 36.05 kg/m. GEN: Well nourished, well developed, in no acute distress  HEENT: normal  Neck: no JVD, carotid bruits, or masses Cardiac: RRR; no murmurs, rubs, or gallops,no edema  Respiratory:  clear to auscultation bilaterally, normal work of breathing GI: soft, nontender, nondistended, + BS MS: no deformity or atrophy  Skin: warm and dry Neuro:  Strength and sensation are intact Psych: euthymic mood, full affect  EKG:  EKG is ordered today. Personal review of the ekg ordered shows sinus rhythm, PACs  Recent Labs: 04/26/2022: Magnesium 2.0 04/27/2022: BUN 13; Creatinine, Ser 0.64; Hemoglobin 10.5; Platelets 167; Potassium 3.6; Sodium 138    Lipid Panel  No results found for: "CHOL", "TRIG", "HDL", "CHOLHDL", "VLDL", "LDLCALC", "LDLDIRECT"   Wt Readings from Last 3  Encounters:  09/19/22 210 lb (95.3 kg)  05/07/22 207 lb 9.6 oz (94.2 kg)  04/30/22 212 lb 3.2 oz (96.3 kg)      Other studies Reviewed: Additional studies/ records that were reviewed today include: TTE 05/25/19  Review of the above records today demonstrates:   1. Left ventricular ejection fraction, by visual estimation, is 60 to  65%. The left ventricle has normal function. There is no left ventricular  hypertrophy.   2. The left ventricle has no regional wall motion abnormalities.   3. Left ventricular diastolic parameters are consistent with Grade I  diastolic dysfunction (impaired relaxation).   4. Elevated left ventricular end-diastolic pressure.   5. Global right ventricle has normal systolic function.The right  ventricular size is normal.  No increase in right ventricular wall  thickness.   6. Left atrial size was mild-moderately dilated.   7. Right atrial size was normal.   8. The mitral valve is normal in structure. No evidence of mitral valve  regurgitation. No evidence of mitral stenosis.   9. The tricuspid valve is normal in structure. Tricuspid valve  regurgitation is trivial.  10. The aortic valve is tricuspid. Aortic valve regurgitation is not  visualized. Mild aortic valve sclerosis without stenosis.  11. The pulmonic valve was normal in structure. Pulmonic valve  regurgitation is not visualized.  12. 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  13. Normal pulmonary artery systolic pressure.  14. The inferior vena cava is normal in size with greater than 50%  respiratory variability, suggesting right atrial pressure of 3 mmHg.    ASSESSMENT AND PLAN:  1.  Persistent atrial fibrillation: Status post ablation May 2022.  Currently on metoprolol and Eliquis.  CHA2DS2-VASc of at least 3.  She is having more frequent episodes of atrial fibrillation.  She would prefer a rhythm control strategy.  She would prefer to avoid long-term medications.  Due to  that, we [place] plan for ablation.  Risk and benefits have been discussed.  She understands the risks and is agreed to the procedure.  As a bridge to ablation, we Promiss Labarbera start propafenone to 25 mg twice daily.  Risk, benefits, and alternatives to EP study and radiofrequency ablation for afib were also discussed in detail today. These risks include but are not limited to stroke, bleeding, vascular damage, tamponade, perforation, damage to the esophagus, lungs, and other structures, pulmonary vein stenosis, worsening renal function, and death. The patient understands these risk and wishes to proceed.  We Arneshia Ade therefore proceed with catheter ablation at the next available time.  Carto, ICE, anesthesia are requested for the procedure.  Wetona Viramontes also obtain CT PV protocol prior to the procedure to exclude LAA thrombus and further evaluate atrial anatomy.   2.  Secondary hypercoagulable state: Currently on Eliquis for atrial fibrillation  3.  Coronary artery disease: No current chest pain.  Plan per primary cardiology  4.  Hypertension: well controlled    Current medicines are reviewed at length with the patient today.   The patient does not have concerns regarding her medicines.  The following changes were made today:  none  Labs/ tests ordered today include:  Orders Placed This Encounter  Procedures   CT CARDIAC MORPH/PULM VEIN W/CM&W/O CA SCORE   EKG 12-Lead     Disposition:   FU with Anyia Gierke 6 months  Signed, Ewell Benassi Jorja Loa, MD  09/19/2022 10:19 AM     [facility] [address] [place] [address] [phone] (office) [phone] (fax)

## 2022-09-19 NOTE — Telephone Encounter (Signed)
Left pt message to call back

## 2022-09-19 NOTE — Patient Instructions (Addendum)
Medication Instructions:  Your physician has recommended you make the following change in your medication:  START Propafenone 225 mg twice daily  *If you need a refill on your cardiac medications before your next appointment, please call your pharmacy*   Lab Work: Pre procedure labs -- see procedure instruction letter:  BMP & CBC  If you have labs (blood work) drawn today and your tests are completely normal, you will receive your results only by: MyChart Message (if you have MyChart) OR A paper copy in the mail If you have any lab test that is abnormal or we need to change your treatment, we will call you to review the results.   Testing/Procedures: Your physician has requested that you have cardiac CT within 7 days PRIOR to your ablation. Cardiac computed tomography (CT) is a painless test that uses an x-ray machine to take clear, detailed pictures of your heart.  Please follow instruction below located under "other instructions". You will get a call from our office to schedule the date for this test.  Your physician has recommended that you have an ablation. Catheter ablation is a medical procedure used to treat some cardiac arrhythmias (irregular heartbeats). During catheter ablation, a long, thin, flexible tube is put into a blood vessel in your groin (upper thigh), or neck. This tube is called an ablation catheter. It is then guided to your heart through the blood vessel. Radio frequency waves destroy small areas of heart tissue where abnormal heartbeats may cause an arrhythmia to start.   You will be scheduled for 01/29/23   The EP scheduler, April, will be in contact to review instructions and send via mychart.   Follow-Up: At [facility], you and your health needs are our priority.  As part of our continuing mission to provide you with exceptional heart care, we have created designated Provider Care Teams.  These Care Teams include your primary Cardiologist (physician) and  Advanced Practice Providers (APPs -  Physician Assistants and Nurse Practitioners) who all work together to provide you with the care you need, when you need it.  We recommend signing up for the patient portal called "MyChart".  Sign up information is provided on this After Visit Summary.  MyChart is used to connect with patients for Virtual Visits (Telemedicine).  Patients are able to view lab/test results, encounter notes, upcoming appointments, etc.  Non-urgent messages can be sent to your provider as well.   To learn more about what you can do with MyChart, go to [email or web address].    Your next appointment:   1 month(s) after your ablation  The format for your next appointment:   In Person  Provider:   AFib clinic   Thank you for choosing [facility]!!   Dory Horn, RN [phone]    Other Instructions   Propafenone Extended-Release Capsules What is this medication? PROPAFENONE (proe pa FEEN one) prevents and treats a fast or irregular heartbeat (arrhythmia). It is often used to treat a type of arrhythmia known as AFib (atrial fibrillation). It works by slowing down overactive electric signals in the heart, which stabilizes your heart rhythm. It belongs to a group of medications called antiarrhythmics. This medicine may be used for other purposes; ask your health care provider or pharmacist if you have questions. COMMON BRAND NAME(S): Rythmol SR What should I tell my care team before I take this medication? They need to know if you have any of these conditions: Heart disease High potassium level Kidney disease Liver disease  Low blood pressure Lung or breathing disease like asthma, chronic bronchitis, or emphysema Pacemaker Slow heart rate An unusual or allergic reaction to propafenone, other medications, foods, dyes, or preservatives Pregnant or trying to get pregnant Breast-feeding How should I use this medication? Take this medication by mouth with a  glass of water. Follow the directions on the prescription label. Swallow whole. Do not crush or chew. You can take this medication with or without food. Take your doses at regular intervals. Do not take your medication more often than directed. Talk to your care team regarding the use of this medication in children. Special care may be needed. Overdosage: If you think you have taken too much of this medicine contact a poison control center or emergency room at once. NOTE: This medicine is only for you. Do not share this medicine with others. What if I miss a dose? If you miss a dose, take it as soon as you can. If it is almost time for your next dose, take only that dose. Do not take double or extra doses. What may interact with this medication? Do not take this medication with any of the following: Arsenic trioxide Certain antibiotics like clarithromycin, erythromycin, grepafloxacin, pentamidine, sparfloxacin, troleandomycin Certain medications for depression or mental illness like amoxapine, haloperidol, maprotiline, pimozide, sertindole, thioridazine, tricyclic antidepressants Certain medications for fungal infections like fluconazole, itraconazole, ketoconazole, posaconazole, voriconazole Certain medications for irregular heartbeat like dronedarone Certain medications for malaria like chloroquine, halofantrine Cisapride Droperidol Levomethadyl Ranolazine This medication may also interact with the following: Certain medications for angina or blood pressure Certain medications for asthma or breathing difficulties like formoterol, salmeterol Certain medications that treat or prevent blood clots like warfarin Cimetidine Cyclosporine Digoxin Diuretics Local anesthetics Other medications that prolong the QT interval (cause an abnormal heart rhythm) like dofetilide, ziprasidone Rifampin Ritonavir Theophylline This list may not describe all possible interactions. Give your health care  provider a list of all the medicines, herbs, non-prescription drugs, or dietary supplements you use. Also tell them if you smoke, drink alcohol, or use illegal drugs. Some items may interact with your medicine. What should I watch for while using this medication? Your condition will be monitored closely when you first begin therapy. Often, this medication is first started in a hospital or other monitored health care setting. Once you are on maintenance therapy, visit your care team for regular checks on your progress. Because your condition and use of this medication carry some risk, it is a good idea to carry an identification card, necklace or bracelet with details of your condition, medications, and care team. You may get drowsy or dizzy. Do not drive, use machinery, or do anything that needs mental alertness until you know how this medication affects you. Do not stand or sit up quickly, especially if you are an older patient. This reduces the risk of dizzy or fainting spells. If you are going to have surgery, tell your care team that you are taking this medication. What side effects may I notice from receiving this medication? Side effects that you should report to your care team as soon as possible: Allergic reactions--skin rash, itching, hives, swelling of the face, lips, tongue, or throat Heart failure--shortness of breath, swelling of the ankles, feet, or hands, sudden weight gain, unusual weakness or fatigue Heart rhythm changes--fast or irregular heartbeat, dizziness, feeling faint or lightheaded, chest pain, trouble breathing Infection--fever, chills, cough, sore throat Unusual bruising or bleeding Side effects that usually do not require medical attention (  report to your care team if they continue or are bothersome): Change in taste Constipation Dizziness Fatigue Nausea This list may not describe all possible side effects. Call your doctor for medical advice about side effects. You may  report side effects to FDA at 1-800-FDA-1088. Where should I keep my medication? Keep out of the reach of children and pets. Store at room temperature between 15 and 30 degrees C (59 and 86 degrees F). Keep container tightly closed. Throw away any unused medication after the expiration date. NOTE: This sheet is a summary. It may not cover all possible information. If you have questions about this medicine, talk to your doctor, pharmacist, or health care provider.  2024 Elsevier/Gold Standard (2020-08-17 00:00:00)     Cardiac Ablation Cardiac ablation is a procedure to destroy (ablate) some heart tissue that is sending bad signals. These bad signals cause problems in heart rhythm. The heart has many areas that make these signals. If there are problems in these areas, they can make the heart beat in a way that is not normal. Destroying some tissues can help make the heart rhythm normal. Tell your doctor about: Any allergies you have. All medicines you are taking. These include vitamins, herbs, eye drops, creams, and over-the-counter medicines. Any problems you or family members have had with medicines that make you fall asleep (anesthetics). Any blood disorders you have. Any surgeries you have had. Any medical conditions you have, such as kidney failure. Whether you are pregnant or may be pregnant. What are the risks? This is a safe procedure. But problems may occur, including: Infection. Bruising and bleeding. Bleeding into the chest. Stroke or blood clots. Damage to nearby areas of your body. Allergies to medicines or dyes. The need for a pacemaker if the normal system is damaged. Failure of the procedure to treat the problem. What happens before the procedure? Medicines Ask your doctor about: Changing or stopping your normal medicines. This is important. Taking aspirin and ibuprofen. Do not take these medicines unless your doctor tells you to take them. Taking other medicines,  vitamins, herbs, and supplements. General instructions Follow instructions from your doctor about what you cannot eat or drink. Plan to have someone take you home from the hospital or clinic. If you will be going home right after the procedure, plan to have someone with you for 24 hours. Ask your doctor what steps will be taken to prevent infection. What happens during the procedure?  An IV tube will be put into one of your veins. You will be given a medicine to help you relax. The skin on your neck or groin will be numbed. A cut (incision) will be made in your neck or groin. A needle will be put through your cut and into a large vein. A tube (catheter) will be put into the needle. The tube will be moved to your heart. Dye may be put through the tube. This helps your doctor see your heart. Small devices (electrodes) on the tube will send out signals. A type of energy will be used to destroy some heart tissue. The tube will be taken out. Pressure will be held on your cut. This helps stop bleeding. A bandage will be put over your cut. The exact procedure may vary among doctors and hospitals. What happens after the procedure? You will be watched until you leave the hospital or clinic. This includes checking your heart rate, breathing rate, oxygen, and blood pressure. Your cut will be watched for bleeding. You  will need to lie still for a few hours. Do not drive for 24 hours or as long as your doctor tells you. Summary Cardiac ablation is a procedure to destroy some heart tissue. This is done to treat heart rhythm problems. Tell your doctor about any medical conditions you may have. Tell him or her about all medicines you are taking to treat them. This is a safe procedure. But problems may occur. These include infection, bruising, bleeding, and damage to nearby areas of your body. Follow what your doctor tells you about food and drink. You may also be told to change or stop some of your  medicines. After the procedure, do not drive for 24 hours or as long as your doctor tells you. This information is not intended to replace advice given to you by your health care provider. Make sure you discuss any questions you have with your health care provider. Document Revised: 06/30/2021 Document Reviewed: 03/12/2019 Elsevier Patient Education  2023 [employer].

## 2022-09-20 NOTE — Telephone Encounter (Signed)
Pt confirms she takes Plaquenil BID and "cannot stop it". She will await my return call w/ MD advisement. Pharmacy called and Rx cancelled.    There is interaction between Plaquenil & Propafenone - these canNOT be taken together.  Our system did not flag me when placing order yesterday, will inform EPIC team to look into/address. Forwarding back to MD for advisement of alternative drug.

## 2022-09-21 NOTE — Telephone Encounter (Signed)
Forwarding to pharmD for review/advisement per MD request...Marland KitchenMarland KitchenMarland Kitchen Are there any anti-arrhythmics that pt can take while on Plaquenil?  (Please forward back to Dr. Elberta Fortis as I am not in the office next week)

## 2022-09-24 ENCOUNTER — Ambulatory Visit: Payer: Medicare PPO | Admitting: Podiatry

## 2022-09-24 ENCOUNTER — Encounter: Payer: Self-pay | Admitting: Podiatry

## 2022-09-24 VITALS — Ht 64.0 in

## 2022-09-24 DIAGNOSIS — S9032XA Contusion of left foot, initial encounter: Secondary | ICD-10-CM

## 2022-09-24 DIAGNOSIS — M21619 Bunion of unspecified foot: Secondary | ICD-10-CM

## 2022-09-24 NOTE — Telephone Encounter (Signed)
Spoke with the patient and let her know that Dr. Elberta Fortis will talk with PharmD to see what her best option would be for an antiarrythmic.   She states that her A Fib bothers her. She reports heart rates in the 40s at times that she monitors through her apple watch. She also is upset that she has to wait until October for her ablation. She has been added to the waitlist.

## 2022-09-24 NOTE — Telephone Encounter (Signed)
Pt called in requesting to speak to [facility]. Advised Roanna Raider is out of the office this week. Pt states she is in a-fib, and can feel it. She would like a c/b discussing her medication options.

## 2022-09-24 NOTE — Progress Notes (Signed)
Subjective:   Patient ID: Lindsay Clark, female   DOB: 82 y.o.   MRN: [id number]   HPI Patient presents with several problems with 1 being structural bunion deformity and trauma to the left foot with digital deformity and discomfort.  Patient is mildly obese does not smoke is not active.  Patient has significant flatfoot deformity and develops blisters in the mid arch area bilateral   Review of Systems  All other systems reviewed and are negative.       Objective:  Physical Exam Vitals and nursing note reviewed.  Constitutional:      Appearance: She is well-developed.  Pulmonary:     Effort: Pulmonary effort is normal.  Musculoskeletal:        General: Normal range of motion.  Skin:    General: Skin is warm.  Neurological:     Mental Status: She is alert.     Neurovascular status found to be intact muscle strength was found to be adequate range of motion adequate with severe flatfoot deformity bilateral with moderate structural bunion deformity requiring chronic lesion formation which form secondary to structure with pressure in the mid arch area with contusion blisterlike formation with certain shoe gear.  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  I do not recommend any aggressive treatment for her and do not recommend any debridement technique as there is nothing I see that would be of value and patient will be seen back as needed and if symptoms get worse we will require more aggressive treatment plan

## 2022-09-24 NOTE — Telephone Encounter (Signed)
Left message for patient to call back  

## 2022-09-24 NOTE — Telephone Encounter (Signed)
Primidone can cause QT prolongation.  Mexiletine, diltiazem and verapamil are the only anti-arrhythmics that don't cause QT prolongation.   However primidone does decrease the mexiletine blood level ( affects CYP1A2).   Of the anti-arrhythmics that do, flecainide and propafenone show the interaction as a B, whereas all the others are a class C interaction.

## 2022-09-28 NOTE — Telephone Encounter (Signed)
Spoke with pt's husband, DPR who states pt is currently not at home.  Advised regarding Dr Elberta Fortis recommendations.  Pt states we will need to speak with pt and he will have her call back to speak with a triage nurse as Despina Hidden is out of the office this week.  Advised to contact [phone].  Pt's husband verbalizes understanding and thanked Charity fundraiser for the phone call.

## 2022-10-01 ENCOUNTER — Telehealth: Payer: Self-pay | Admitting: Cardiology

## 2022-10-01 MED ORDER — FLECAINIDE ACETATE 50 MG PO TABS: 50.0000 mg | ORAL_TABLET | Freq: Two times a day (BID) | ORAL | 3 refills | Status: DC

## 2022-10-01 NOTE — Telephone Encounter (Signed)
STAT if HR is under 50 or over 120 (normal HR is 60-100 beats per minute)  What is your heart rate? Range 40 to 80 in 30 seconds and 40-115 within the hour between 12 noon and 1:00 pm while sitting still  Do you have a log of your heart rate readings (document readings)? Uses Apple watch  Do you have any other symptoms? Lightheaded, no energy, "head feels strange"   Patient is concerned about HR reading and returning call to RN regarding not receiving new medication.

## 2022-10-01 NOTE — Telephone Encounter (Signed)
Lindsay Lemming, MD    09/27/22  4:40 PM Start flecainide 50 mg. Needs ECG in 1 week. If in AF at that time will need DCCV.  Pt advised and verbalized understanding... ECG 10/09/22.   Pt will send her apple watch readings to her My Chart for our review.   She says she was calling back to talk to a nurse re: a message we left and not to get into the message that was taken by the scheduler.   She says she is feeling well now and will let us know if anything changes.

## 2022-10-01 NOTE — Telephone Encounter (Signed)
Both meds can prolong QTc. Per other phone note from today, pt has EKG scheduled in 1 week. Ok to take both meds since pt has close EKG follow up.

## 2022-10-01 NOTE — Telephone Encounter (Signed)
Pt c/o medication issue:  1. Name of Medication: flecainide (TAMBOCOR) 50 MG tablet   2. How are you currently taking this medication (dosage and times per day)?    3. Are you having a reaction (difficulty breathing--STAT)? no  4. What is your medication issue? Calling to say this medication interact with   hydroxychloroquine (PLAQUENIL) 200 MG tablet  . Please advise

## 2022-10-01 NOTE — Telephone Encounter (Signed)
Follow Up:      Patient says she is returning a call from Friday. She said she was told to ask for the Triage nurse.

## 2022-10-03 ENCOUNTER — Encounter: Payer: Self-pay | Admitting: Rehabilitative and Restorative Service Providers"

## 2022-10-05 ENCOUNTER — Telehealth: Payer: Self-pay

## 2022-10-05 NOTE — Telephone Encounter (Signed)
   Pre-operative Risk Assessment    Patient Name: Lindsay Clark  DOB: 1940-10-14 MRN: [id number]      Request for Surgical Clearance    Procedure:   LEFT KNEE REPLACEMENT   Date of Surgery:  Clearance TBD                                 Surgeon:  DR. Doneen Poisson Surgeon's Group or Practice Name:  [facility] [place] Phone number:  [phone] Fax number:  [phone]   Type of Clearance Requested:   - Medical  - Pharmacy:  Hold Apixaban (Eliquis) NEEDS INSTRUCTIONS WHEN TO HOLD    Type of Anesthesia:  Spinal   Additional requests/questions:    SignedMichaelle Copas   10/05/2022, 5:06 PM

## 2022-10-08 NOTE — Telephone Encounter (Signed)
Patient with diagnosis of PAF(paroxysmal atrial fibrillation)  on Eliquis for anticoagulation.    Procedure:   LEFT KNEE REPLACEMENT  Date of procedure: TBD   CHA2DS2-VASc Score = 5   This indicates a 7.2% annual risk of stroke. The patient's score is based upon: CHF History: 0 HTN History: 1 Diabetes History: 0 Stroke History: 0 Vascular Disease History: 1 Age Score: 2 Gender Score: 1     CrCl 78 mL/min (01/26/23) Platelet count 167 K  Ablation has been scheduled for 01/29/2023 patient need to be on  uninterrupted anticoagulation starting from 01/08/2023  Per office protocol, patient can hold Eliquis for 3 days prior to procedure.     **This guidance is not considered finalized until pre-operative APP has relayed final recommendations.**

## 2022-10-09 ENCOUNTER — Ambulatory Visit: Payer: Medicare PPO | Attending: Cardiology

## 2022-10-09 VITALS — BP 132/76 | HR 74 | Wt 212.4 lb

## 2022-10-09 DIAGNOSIS — I4819 Other persistent atrial fibrillation: Secondary | ICD-10-CM

## 2022-10-09 NOTE — Progress Notes (Signed)
   Nurse Visit   Date of Encounter: 10/09/2022 ID: Lindsay Clark, DOB 1940-12-05, MRN [id number]  PCP:  Laurann Montana, MD   [facility] Providers Cardiologist:  Olga Millers, MD Electrophysiologist:  Regan Lemming, MD      Visit Details   VS:  BP 132/76 (BP Location: Right Arm, Patient Position: Sitting, Cuff Size: Large)   Pulse 74   Wt 212 lb 6.4 oz (96.3 kg)   BMI 36.46 kg/m  , BMI Body mass index is 36.46 kg/m.  Wt Readings from Last 3 Encounters:  10/09/22 212 lb 6.4 oz (96.3 kg)  09/19/22 210 lb (95.3 kg)  05/07/22 207 lb 9.6 oz (94.2 kg)     Reason for visit: EKG Performed today: Vitals, EKG, Provider consulted:Dr. Shari Prows, and Education Changes (medications, testing, etc.) : None Length of Visit: 30 minutes    Medications Adjustments/Labs and Tests Ordered: Orders Placed This Encounter  Procedures   EKG 12-Lead   EKG 12-Lead   No orders of the defined types were placed in this encounter.  Patient will have repeat EKG on her visit with Dr. Jens Som on July 8th per Dr. Shari Prows. She is aware there will be no changes to medications at this time.   Signed, [facility] Zeiss Merilynn Finland, LPN  4/69/6295 28:41 PM

## 2022-10-09 NOTE — Telephone Encounter (Signed)
   Patient Name: Lindsay Clark  DOB: [birth year] MRN: [id number]  Primary Cardiologist: Olga Millers, MD  Chart reviewed as part of pre-operative protocol coverage.  Spoke with patient by phone who confirms she is no longer planning on having knee surgery.   Of note, ablation has been scheduled for 01/29/2023 patient need to be on  uninterrupted anticoagulation starting from 01/08/2023.    I will route this recommendation to the requesting party via Epic fax function and remove from pre-op pool.  Please call with questions.  Joylene Grapes, NP 10/09/2022, 10:40 AM

## 2022-10-12 ENCOUNTER — Telehealth: Payer: Self-pay

## 2022-10-12 NOTE — Telephone Encounter (Signed)
Received voice mail from patient that she is not ready to proceed with left TKA at this time.

## 2022-10-15 DIAGNOSIS — H52203 Unspecified astigmatism, bilateral: Secondary | ICD-10-CM | POA: Diagnosis not present

## 2022-10-15 DIAGNOSIS — H2512 Age-related nuclear cataract, left eye: Secondary | ICD-10-CM | POA: Diagnosis not present

## 2022-10-15 DIAGNOSIS — H35371 Puckering of macula, right eye: Secondary | ICD-10-CM | POA: Diagnosis not present

## 2022-10-15 DIAGNOSIS — Z961 Presence of intraocular lens: Secondary | ICD-10-CM | POA: Diagnosis not present

## 2022-10-15 DIAGNOSIS — H353131 Nonexudative age-related macular degeneration, bilateral, early dry stage: Secondary | ICD-10-CM | POA: Diagnosis not present

## 2022-10-19 NOTE — Progress Notes (Signed)
HPI: FU PAF. 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 Monitor October 2018 showed paroxysmal atririllation and flutter. Nuclear study October 2018 showed ejection fraction 67%, apical thinning but no ischemia. Placed on apixaban.  Had implantable loop recorder placed by Dr. Johney Frame for syncope and recurrent palpitations. She has been found to have "heart pounding" episodes not related to arrhythmia. Echo repeated 2/21 showed normal LV function, grade 1 DD, mild to moderate LAE. Coronary CTA 2/21 showed Ca score 36 and minimal (0-24%) in proximal LM, proximal and mid LAD and proximal Lcx.  Patient had atrial fibrillation ablation at [facility] May 2022.  Seen by Dr. Elberta Fortis May 2024 with recurrent episodes of atrial fibrillation and has been scheduled for repeat ablation.  Since last seen she denies dyspnea, chest pain or syncope.  She has a occasional palpitations.  Current Outpatient Medications  Medication Sig Dispense Refill   Cholecalciferol (VITAMIN D) 50 MCG (2000 UT) CAPS Take 2,000 Units by mouth in the morning and at bedtime.     clobetasol cream (TEMOVATE) 0.05 % Apply 1 Application topically daily as needed (itching).     ELIQUIS 5 MG TABS tablet TAKE ONE TABLET BY MOUTH TWICE DAILY 60 tablet 5   flecainide (TAMBOCOR) 50 MG tablet Take 1 tablet (50 mg total) by mouth 2 (two) times daily. 180 tablet 3   furosemide (LASIX) 40 MG tablet Take 2 tablets (80 mg total) by mouth daily. 180 tablet 3   hydroxychloroquine (PLAQUENIL) 200 MG tablet Take 200 mg by mouth 2 (two) times daily.     methocarbamol (ROBAXIN) 500 MG tablet Take 1 tablet (500 mg total) by mouth every 6 (six) hours as needed for muscle spasms. (Patient not taking: Reported on 10/09/2022) 30 tablet 0   Multiple Vitamin (MULTIVITAMIN WITH MINERALS) TABS tablet Take 1 tablet by mouth in the morning. One-A-Day for Women 50+     Polyethyl Glycol-Propyl Glycol (SYSTANE OP) Place 1 drop into both eyes at bedtime.      No current facility-administered medications for this visit.     Past Medical History:  Diagnosis Date   Atherosclerosis    mild cerebral   Atrial fibrillation and flutter (HCC)    Autoimmune disorder (HCC)    Autoimmune encephalitis    Cervical disc disease    with spondylosis   Cervical spondylosis    Chronic dermatitis    eczematous   Colitis    COVID 10/2020   DDD (degenerative disc disease), lumbar    Frozen shoulder    left   Hyperlipidemia    Hypertension    IBS (irritable bowel syndrome)    with diarrhea Pt stated she never had this   Lateral epicondylitis    Lumbar spondylosis    Lupus (HCC)    Movement disorder    Multiple lesions on CT of brain and spine    reports lesions from previous CT scan at [facility]    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   PONV (postoperative nausea and vomiting)    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   Torn meniscus    left    Past Surgical History:  Procedure Laterality Date   A FLUTTER ABLATION  08/2020   [facility] and had cardioversion at the same time   CATARACT EXTRACTION     COLONOSCOPY     EYE SURGERY Left    HEMORRHOID BANDING     KNEE ARTHROSCOPY Left 08/09/2020   Procedure: LEFT KNEE ARTHROSCOPY  MEDIAL AND LATERAL MENISECTONMY AND CONDROPLASTY;  Surgeon: Marcene Corning, MD;  Location: [facility] ORS;  Service: Orthopedics;  Laterality: Left;   LOOP RECORDER INSERTION N/A 07/05/2017   Procedure: LOOP RECORDER INSERTION;  Surgeon: Hillis Range, MD;  Location: [facility] INVASIVE CV LAB;  Service: Cardiovascular;  Laterality: N/A;   TOTAL KNEE ARTHROPLASTY Right 04/24/2022   Procedure: RIGHT TOTAL KNEE ARTHROPLASTY;  Surgeon: Kathryne Hitch, MD;  Location: [facility] OR;  Service: Orthopedics;  Laterality: Right;   TUBAL LIGATION  1980   WISDOM TOOTH EXTRACTION      Social History   Socioeconomic History   Marital status: Married    Spouse name: Brett Canales   Number of children: 2   Years of education: MAx2   Highest  education level: Not on file  Occupational History   Occupation: Retired    Associate Professor: [employer]  Tobacco Use   Smoking status: Never   Smokeless tobacco: Never  Vaping Use   Vaping Use: Never used  Substance and Sexual Activity   Alcohol use: Yes    Alcohol/week: 4.0 standard drinks of alcohol    Types: 4 Glasses of wine per week    Comment: 7 glasses of wine weekly   Drug use: No   Sexual activity: Not Currently  Other Topics Concern   Not on file  Social History Narrative   Patient lives at home with her spouse.   Caffeine Use: 2-3 cups daily   Social Determinants of Health   Financial Resource Strain: Not on file  Food Insecurity: No Food Insecurity (04/24/2022)   Hunger Vital Sign    Worried About Running Out of Food in the Last Year: Never true    Ran Out of Food in the Last Year: Never true  Transportation Needs: No Transportation Needs (04/24/2022)   PRAPARE - Administrator, Civil Service (Medical): No    Lack of Transportation (Non-Medical): No  Physical Activity: Not on file  Stress: Not on file  Social Connections: Not on file  Intimate Partner Violence: Not At Risk (04/24/2022)   Humiliation, Afraid, Rape, and Kick questionnaire    Fear of Current or Ex-Partner: No    Emotionally Abused: No    Physically Abused: No    Sexually Abused: No    Family History  Problem Relation Age of Onset   Cancer Mother 76       colon cancer   Stroke Mother    Colon cancer Mother 62   Heart attack Father 58   Emphysema Father    Cancer Maternal Grandmother 73       uterine   Thyroid disease Maternal Grandmother    Thrombosis Maternal Grandfather    Thyroid disease Son     ROS: no fevers or chills, productive cough, hemoptysis, dysphasia, odynophagia, melena, hematochezia, dysuria, hematuria, rash, seizure activity, orthopnea, PND, pedal edema, claudication. Remaining systems are negative.  Physical Exam: Well-developed well-nourished in no acute distress.   Skin is warm and dry.  HEENT is normal.  Neck is supple.  Chest is clear to auscultation with normal expansion.  Cardiovascular exam is regular rate and rhythm.  2/6 systolic murmur left sternal border. Abdominal exam nontender or distended. No masses palpated. Extremities show no edema. neuro grossly intact  A/P  1 paroxysmal atrial fibrillation-patient is having more frequent episodes of atrial fibrillation.  Will continue Toprol at present dose.  Continue apixaban.  She is scheduled for repeat ablation in October.  2 coronary artery disease-minimal on previous  coronary CTA.  She has declined all lipid-lowering medications.  No aspirin given need for apixaban.  3 hypertension-patient's blood pressure is controlled.  Continue present medical regimen.  4 history of syncope-no recurrences.  5 history of lower extremity edema-continue diuretic at present dose.   Olga Millers, MD

## 2022-10-29 ENCOUNTER — Ambulatory Visit: Payer: Medicare PPO | Attending: Cardiology | Admitting: Cardiology

## 2022-10-29 ENCOUNTER — Encounter: Payer: Self-pay | Admitting: Cardiology

## 2022-10-29 VITALS — BP 132/74 | HR 52 | Ht 64.0 in | Wt 208.6 lb

## 2022-10-29 DIAGNOSIS — I2584 Coronary atherosclerosis due to calcified coronary lesion: Secondary | ICD-10-CM | POA: Diagnosis not present

## 2022-10-29 DIAGNOSIS — I10 Essential (primary) hypertension: Secondary | ICD-10-CM

## 2022-10-29 DIAGNOSIS — I2510 Atherosclerotic heart disease of native coronary artery without angina pectoris: Secondary | ICD-10-CM

## 2022-10-29 DIAGNOSIS — I4819 Other persistent atrial fibrillation: Secondary | ICD-10-CM

## 2022-10-29 NOTE — Patient Instructions (Signed)
    Follow-Up: At [facility], you and your health needs are our priority.  As part of our continuing mission to provide you with exceptional heart care, we have created designated Provider Care Teams.  These Care Teams include your primary Cardiologist (physician) and Advanced Practice Providers (APPs -  Physician Assistants and Nurse Practitioners) who all work together to provide you with the care you need, when you need it.  We recommend signing up for the patient portal called "MyChart".  Sign up information is provided on this After Visit Summary.  MyChart is used to connect with patients for Virtual Visits (Telemedicine).  Patients are able to view lab/test results, encounter notes, upcoming appointments, etc.  Non-urgent messages can be sent to your provider as well.   To learn more about what you can do with MyChart, go to [email or web address].    Your next appointment:   6 month(s)  Provider:   Brian Crenshaw, MD      

## 2022-10-30 DIAGNOSIS — I10 Essential (primary) hypertension: Secondary | ICD-10-CM | POA: Diagnosis not present

## 2022-10-30 DIAGNOSIS — E559 Vitamin D deficiency, unspecified: Secondary | ICD-10-CM | POA: Diagnosis not present

## 2022-10-30 DIAGNOSIS — E785 Hyperlipidemia, unspecified: Secondary | ICD-10-CM | POA: Diagnosis not present

## 2022-11-02 DIAGNOSIS — Z0000 Encounter for general adult medical examination without abnormal findings: Secondary | ICD-10-CM | POA: Diagnosis not present

## 2022-11-02 DIAGNOSIS — E785 Hyperlipidemia, unspecified: Secondary | ICD-10-CM | POA: Diagnosis not present

## 2022-11-02 DIAGNOSIS — I480 Paroxysmal atrial fibrillation: Secondary | ICD-10-CM | POA: Diagnosis not present

## 2022-11-02 DIAGNOSIS — I10 Essential (primary) hypertension: Secondary | ICD-10-CM | POA: Diagnosis not present

## 2022-11-02 DIAGNOSIS — I700 Atherosclerosis of aorta: Secondary | ICD-10-CM | POA: Diagnosis not present

## 2022-11-02 DIAGNOSIS — I672 Cerebral atherosclerosis: Secondary | ICD-10-CM | POA: Diagnosis not present

## 2022-11-02 DIAGNOSIS — N952 Postmenopausal atrophic vaginitis: Secondary | ICD-10-CM | POA: Diagnosis not present

## 2022-11-02 DIAGNOSIS — D6869 Other thrombophilia: Secondary | ICD-10-CM | POA: Diagnosis not present

## 2022-11-02 DIAGNOSIS — M359 Systemic involvement of connective tissue, unspecified: Secondary | ICD-10-CM | POA: Diagnosis not present

## 2022-11-05 ENCOUNTER — Encounter: Payer: Self-pay | Admitting: Cardiology

## 2022-11-05 ENCOUNTER — Encounter: Payer: Self-pay | Admitting: Orthopaedic Surgery

## 2022-11-12 ENCOUNTER — Ambulatory Visit: Payer: Medicare PPO | Admitting: Orthopaedic Surgery

## 2022-11-12 DIAGNOSIS — M7061 Trochanteric bursitis, right hip: Secondary | ICD-10-CM | POA: Diagnosis not present

## 2022-11-12 DIAGNOSIS — M25562 Pain in left knee: Secondary | ICD-10-CM | POA: Diagnosis not present

## 2022-11-12 DIAGNOSIS — M7062 Trochanteric bursitis, left hip: Secondary | ICD-10-CM | POA: Diagnosis not present

## 2022-11-12 DIAGNOSIS — G8929 Other chronic pain: Secondary | ICD-10-CM

## 2022-11-12 MED ORDER — METHYLPREDNISOLONE ACETATE 40 MG/ML IJ SUSP
40.0000 mg | INTRAMUSCULAR | Status: AC | PRN
Start: 2022-11-12 — End: 2022-11-12
  Administered 2022-11-12: 40 mg via INTRA_ARTICULAR

## 2022-11-12 MED ORDER — LIDOCAINE HCL 1 % IJ SOLN
3.0000 mL | INTRAMUSCULAR | Status: AC | PRN
Start: 2022-11-12 — End: 2022-11-12
  Administered 2022-11-12: 3 mL

## 2022-11-12 MED ORDER — METHYLPREDNISOLONE ACETATE 40 MG/ML IJ SUSP
40.0000 mg | INTRAMUSCULAR | Status: AC | PRN
  Administered 2022-11-12: 40 mg via INTRA_ARTICULAR

## 2022-11-12 NOTE — Progress Notes (Signed)
The patient is well-known to me.  We replaced her right knee earlier this year in January.  She is dealing with bilateral hip pain over the trochanteric of both hips and her left knee has chronic pain knowing that that needs a knee replacement at some point due to severe arthritis of the left knee.  She is requesting bilateral hip trochanteric injections and a left knee injection today.  She is not a diabetic.  She has been dealing with A-fib and is scheduled to have an ablation on believe in October.  She is going on a trip next week which will involve a lot of walking.  Both hips hurt over the trochanteric area.  Both hips have fluid and full range of motion with no pain in the groin on either hip.  Her right operative knee has got improve motion overall.  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  I did place a steroid injection over both hip trochanteric areas and in her left knee joint without difficulty.  We can see her back in about 3 months to see how she is doing overall but no x-rays are needed.  On some patchy areas of skin over her right knee she should try hydrocortisone cream.      Procedure Note  Patient: Lindsay Clark             Date of Birth: June 08, 1940           MRN: [id number]             Visit Date: 11/12/2022  Procedures: Visit Diagnoses:  1. Chronic pain of left knee   2. Trochanteric bursitis, right hip   3. Trochanteric bursitis, left hip     Large Joint Inj: L knee on 11/12/2022 11:04 AM Indications: diagnostic evaluation and pain Details: 22 G 1.5 in needle, superolateral approach  Arthrogram: No  Medications: 3 mL lidocaine 1 %; 40 mg methylPREDNISolone acetate 40 MG/ML Outcome: tolerated well, no immediate complications Procedure, treatment alternatives, risks and benefits explained, specific risks discussed. Consent was given by the patient. Immediately prior to procedure a time out was called to verify the  correct patient, procedure, equipment, support staff and site/side marked as required. Patient was prepped and draped in the usual sterile fashion.    Large Joint Inj: R greater trochanter on 11/12/2022 11:04 AM Indications: pain and diagnostic evaluation Details: 22 G 1.5 in needle, lateral approach  Arthrogram: No  Medications: 3 mL lidocaine 1 %; 40 mg methylPREDNISolone acetate 40 MG/ML Outcome: tolerated well, no immediate complications Procedure, treatment alternatives, risks and benefits explained, specific risks discussed. Consent was given by the patient. Immediately prior to procedure a time out was called to verify the correct patient, procedure, equipment, support staff and site/side marked as required. Patient was prepped and draped in the usual sterile fashion.    Large Joint Inj: L greater trochanter on 11/12/2022 11:04 AM Indications: pain and diagnostic evaluation Details: 22 G 1.5 in needle, lateral approach  Arthrogram: No  Medications: 3 mL lidocaine 1 %; 40 mg methylPREDNISolone acetate 40 MG/ML Outcome: tolerated well, no immediate complications Procedure, treatment alternatives, risks and benefits explained, specific risks discussed. Consent was given by the patient. Immediately prior to procedure a time out was called to verify the correct patient, procedure, equipment, support staff and site/side marked as required. Patient was prepped and draped in the usual sterile fashion.

## 2022-11-13 NOTE — Telephone Encounter (Signed)
Left message w/ husband, asked him to let pt know we are still awaiting MD response. He appreciates my update and will let pt know.

## 2022-11-14 ENCOUNTER — Encounter: Payer: Self-pay | Admitting: Cardiology

## 2022-11-14 ENCOUNTER — Encounter: Payer: Self-pay | Admitting: Orthopaedic Surgery

## 2022-11-16 NOTE — Telephone Encounter (Signed)
Will wait for ablation with no added medication at this time.

## 2022-12-13 DIAGNOSIS — L932 Other local lupus erythematosus: Secondary | ICD-10-CM | POA: Diagnosis not present

## 2022-12-13 DIAGNOSIS — Z79899 Other long term (current) drug therapy: Secondary | ICD-10-CM | POA: Diagnosis not present

## 2022-12-13 DIAGNOSIS — M1991 Primary osteoarthritis, unspecified site: Secondary | ICD-10-CM | POA: Diagnosis not present

## 2022-12-13 DIAGNOSIS — E669 Obesity, unspecified: Secondary | ICD-10-CM | POA: Diagnosis not present

## 2022-12-13 DIAGNOSIS — Z6835 Body mass index (BMI) 35.0-35.9, adult: Secondary | ICD-10-CM | POA: Diagnosis not present

## 2022-12-25 ENCOUNTER — Encounter: Payer: Self-pay | Admitting: Family Medicine

## 2022-12-27 ENCOUNTER — Encounter (HOSPITAL_BASED_OUTPATIENT_CLINIC_OR_DEPARTMENT_OTHER): Payer: Self-pay | Admitting: Obstetrics & Gynecology

## 2022-12-27 ENCOUNTER — Ambulatory Visit (HOSPITAL_BASED_OUTPATIENT_CLINIC_OR_DEPARTMENT_OTHER): Payer: Medicare PPO | Admitting: Obstetrics & Gynecology

## 2022-12-27 ENCOUNTER — Other Ambulatory Visit (HOSPITAL_COMMUNITY)
Admission: RE | Admit: 2022-12-27 | Discharge: 2022-12-27 | Disposition: A | Discharge status: Home / Self Care | Payer: Medicare PPO | Source: Ambulatory Visit | Attending: Obstetrics & Gynecology | Admitting: Obstetrics & Gynecology

## 2022-12-27 VITALS — BP 131/72 | HR 84 | Ht 64.0 in | Wt 208.8 lb

## 2022-12-27 DIAGNOSIS — N898 Other specified noninflammatory disorders of vagina: Secondary | ICD-10-CM | POA: Diagnosis not present

## 2022-12-27 DIAGNOSIS — N952 Postmenopausal atrophic vaginitis: Secondary | ICD-10-CM | POA: Diagnosis not present

## 2022-12-27 MED ORDER — NONFORMULARY OR COMPOUNDED ITEM
3 refills | Status: DC
Start: 2022-12-27 — End: 2023-06-05

## 2022-12-27 NOTE — Progress Notes (Signed)
GYNECOLOGY  VISIT  CC:   vaginal discharge/spotting  HPI: 82 y.o. G2P2 Married White or Caucasian female here for complaint of continued vaginal discharge.  Occasionally she sees a little blood as well.  She has underwear that she can show me what she is seeing.  I saw her about two years ago for similar complaint.  Vaginitis testing was negative and pap smear was normal.  At that time, we discussed treatment with Vit E vaginal suppositories (she was uncomfortable purchasing from local compounding pharmacy or [employer]), vaginal estrogen cream or tablets (she does have afib but is on eliquis), and other vaginal preparations (but her preference is oral therapy).  As this is not typically treated with oral medications and vaginitis testing was negative as well as having negative pap smear, she ultimately decided to follow with conservatively management.   More recently, she discussed this with her PCP, Dr. Cliffton Asters, who recommended she return for additional evaluation and discussion of treatment options.   Past Medical History:  Diagnosis Date   Atherosclerosis    mild cerebral   Atrial fibrillation and flutter (HCC)    Autoimmune disorder (HCC)    Autoimmune encephalitis    Cervical disc disease    with spondylosis   Cervical spondylosis    Chronic dermatitis    eczematous   Colitis    COVID 10/2020   DDD (degenerative disc disease), lumbar    Frozen shoulder    left   Hyperlipidemia    Hypertension    IBS (irritable bowel syndrome)    with diarrhea Pt stated she never had this   Lateral epicondylitis    Lumbar spondylosis    Lupus (HCC)    Movement disorder    Multiple lesions on CT of brain and spine    reports lesions from previous CT scan at [facility]    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   PONV (postoperative nausea and vomiting)    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     clobetasol cream (TEMOVATE) 0.05 % Apply 1 Application topically daily as needed (itching).     ELIQUIS 5 MG TABS tablet TAKE ONE TABLET BY MOUTH TWICE DAILY 60 tablet 5   flecainide (TAMBOCOR) 50 MG tablet Take 1 tablet (50 mg total) by mouth 2 (two) times daily. 180 tablet 3   furosemide (LASIX) 40 MG tablet Take 2 tablets (80 mg total) by mouth daily. 180 tablet 3   hydroxychloroquine (PLAQUENIL) 200 MG tablet Take 200 mg by mouth 2 (two) times daily.     methocarbamol (ROBAXIN) 500 MG tablet Take 1 tablet (500 mg total) by mouth every 6 (six) hours as needed for muscle spasms. 30 tablet 0   Multiple Vitamin (MULTIVITAMIN WITH MINERALS) TABS tablet Take 1 tablet by mouth in the morning. One-A-Day for Women 50+     Polyethyl Glycol-Propyl Glycol (SYSTANE OP) Place 1 drop into both eyes at bedtime.     POTASSIUM PO Take by mouth.     No current facility-administered medications on file prior to visit.    ALLERGIES: Amlodipine, Diltiazem, Methotrexate, Metronidazole, Other, and Quaternium-15  SH:  married, non smoker  Review of Systems  Constitutional: Negative.   Genitourinary:  Discharge with scant spotting    PHYSICAL EXAMINATION:    BP 131/72 (BP Location: Right Arm, Patient Position: Sitting, Cuff Size: Large)   Pulse 84   Ht 5\' 4"  (1.626 m)   Wt 208 lb 12.8 oz (94.7 kg)   BMI 35.84 kg/m     General appearance: alert, cooperative and appears stated age Lymph:  no inguinal LAD noted  Pelvic: External genitalia:  no lesions              Urethra:  normal appearing urethra with no masses, tenderness or lesions              Bartholins and Skenes: normal                 Vagina: significant atrophy with small amount of blood present just with obtaining vaginitis sample with q tip swab              Cervix: no lesions and atrophic  changes noted , no blood at os              Bimanual Exam:  Uterus:  normal size, contour, position, consistency, mobility, non-tender           Assessment/Plan: 1. Vaginal discharge - Cervicovaginal ancillary only( [place]) - will treat if any of this is positive  2. Vaginal atrophy - reviewed treatment options.  She does prefer oral therapy but there really isn't one that would be appropriate.  The only oral therapy available is not recommended with DVT/PE risk so would not be appropriate for her.  Topical options reviewed.  She is open to researching sources for Vit E vaginal suppositories so rx written for pt to take with her today.  - NONFORMULARY OR COMPOUNDED ITEM; Vitamin E vaginal suppositories 200u/ml.  One pv two to three times weekly.  Dispense: 36 each; Refill: 3

## 2022-12-28 LAB — CERVICOVAGINAL ANCILLARY ONLY
Bacterial Vaginitis (gardnerella): POSITIVE — AB
Candida Glabrata: NEGATIVE
Candida Vaginitis: NEGATIVE
Comment: NEGATIVE
Comment: NEGATIVE
Comment: NEGATIVE

## 2022-12-29 ENCOUNTER — Encounter: Payer: Self-pay | Admitting: Orthopaedic Surgery

## 2022-12-31 ENCOUNTER — Other Ambulatory Visit (HOSPITAL_BASED_OUTPATIENT_CLINIC_OR_DEPARTMENT_OTHER): Payer: Self-pay | Admitting: Obstetrics & Gynecology

## 2022-12-31 DIAGNOSIS — B9689 Other specified bacterial agents as the cause of diseases classified elsewhere: Secondary | ICD-10-CM

## 2022-12-31 MED ORDER — METRONIDAZOLE 500 MG PO TABS
500.0000 mg | ORAL_TABLET | Freq: Two times a day (BID) | ORAL | 0 refills | Status: DC
Start: 2022-12-31 — End: 2023-01-15

## 2023-01-02 ENCOUNTER — Telehealth: Payer: Self-pay | Admitting: Cardiology

## 2023-01-02 NOTE — Telephone Encounter (Signed)
Called pt to get more information and to reschedule if needed. Had to [facility] for her to call me back.

## 2023-01-02 NOTE — Telephone Encounter (Signed)
Patient is calling to have ablation cancel that's schedule for October. Please advise

## 2023-01-07 NOTE — Telephone Encounter (Signed)
Pt would like to cancel her Afib Ablation that is scheduled on 10/8. She stated that she had one done in May of 2022 and it went well. She had R knee replacement in January 2024 and at that time went back in to Afib. She needs to have her L knee replacement done and doesn't feel like it would be beneficial to have her Ablation done prior to that if there is a chance she may go back in to Afib.  She does not want to reschedule at this time. She will have a f/u with Dr. Jens Som in January 2025 and if he feels like she needs to be seen by EP again she will call to make the appointment.   Pt's procedure and pre-procedure appointments have been cancelled per patients request.

## 2023-01-07 NOTE — Telephone Encounter (Signed)
Patient stated she wants a call back directly from April regarding canceling her procedure on 10/8.

## 2023-01-08 ENCOUNTER — Telehealth (HOSPITAL_BASED_OUTPATIENT_CLINIC_OR_DEPARTMENT_OTHER): Payer: Self-pay

## 2023-01-08 NOTE — Telephone Encounter (Signed)
Patient called in today when concerns of BV symptoms. Patient states she was under the impression that the medication that was given to her to treat BV was suppose to be for 2 weeks. She states that she is some better but not completely. She used the last of the medication this morning. I informed patient that if she was finished with the medication and was still having symptoms that she could come in to do a self swab for evaluation. Patient declined and wanted to wait to see Dr. Hyacinth Meeker at her next scheduled follow up appointment on 01/15/2023 @ 10:55. tbw

## 2023-01-09 ENCOUNTER — Encounter: Payer: Self-pay | Admitting: Cardiology

## 2023-01-15 ENCOUNTER — Ambulatory Visit (HOSPITAL_BASED_OUTPATIENT_CLINIC_OR_DEPARTMENT_OTHER): Payer: Medicare PPO | Admitting: Obstetrics & Gynecology

## 2023-01-15 ENCOUNTER — Other Ambulatory Visit (HOSPITAL_COMMUNITY)
Admission: RE | Admit: 2023-01-15 | Discharge: 2023-01-15 | Disposition: A | Discharge status: Home / Self Care | Payer: Medicare PPO | Source: Ambulatory Visit | Attending: Obstetrics & Gynecology | Admitting: Obstetrics & Gynecology

## 2023-01-15 ENCOUNTER — Encounter (HOSPITAL_BASED_OUTPATIENT_CLINIC_OR_DEPARTMENT_OTHER): Payer: Self-pay | Admitting: Obstetrics & Gynecology

## 2023-01-15 DIAGNOSIS — N760 Acute vaginitis: Secondary | ICD-10-CM | POA: Diagnosis not present

## 2023-01-15 DIAGNOSIS — N898 Other specified noninflammatory disorders of vagina: Secondary | ICD-10-CM

## 2023-01-15 DIAGNOSIS — B9689 Other specified bacterial agents as the cause of diseases classified elsewhere: Secondary | ICD-10-CM | POA: Diagnosis not present

## 2023-01-15 MED ORDER — METRONIDAZOLE 500 MG PO TABS
500.0000 mg | ORAL_TABLET | Freq: Two times a day (BID) | ORAL | 0 refills | Status: AC
Start: 2023-01-15 — End: 2023-01-29

## 2023-01-16 LAB — CERVICOVAGINAL ANCILLARY ONLY
Bacterial Vaginitis (gardnerella): POSITIVE — AB
Candida Glabrata: NEGATIVE
Candida Vaginitis: NEGATIVE
Comment: NEGATIVE
Comment: NEGATIVE
Comment: NEGATIVE

## 2023-01-18 ENCOUNTER — Other Ambulatory Visit: Payer: Self-pay | Admitting: Cardiology

## 2023-01-18 DIAGNOSIS — I480 Paroxysmal atrial fibrillation: Secondary | ICD-10-CM

## 2023-01-18 NOTE — Telephone Encounter (Signed)
Eliquis 5mg  refill request received. Patient is 82 years old, weight-95kg, Crea- 0.64 on 04/27/22, Diagnosis-Afib, and last seen by Dr. Jens Som on 10/29/22. Dose is appropriate based on dosing criteria. Will send in refill to requested pharmacy.

## 2023-01-21 NOTE — Progress Notes (Signed)
GYNECOLOGY  VISIT  CC:   vaginal discharge  HPI: 82 y.o. G2P2 Married White or Caucasian female here for repeat BV testing.  Reports discharge is better but desires another week of treatment.  Feel should repeat testing first and see results.  If positive, will continue treatment.  She would like RX to pharmacy so can get it is positive as knows I will be in the OR tomorrow when results are likely to come back.  Denies vaginal bleeding.   Past Medical History:  Diagnosis Date   Atherosclerosis    mild cerebral   Atrial fibrillation and flutter (HCC)    Autoimmune disorder (HCC)    Autoimmune encephalitis    Cervical disc disease    with spondylosis   Cervical spondylosis    Chronic dermatitis    eczematous   Colitis    COVID 10/2020   DDD (degenerative disc disease), lumbar    Frozen shoulder    left   Hyperlipidemia    Hypertension    IBS (irritable bowel syndrome)    with diarrhea Pt stated she never had this   Lateral epicondylitis    Lumbar spondylosis    Lupus (HCC)    Movement disorder    Multiple lesions on CT of brain and spine    reports lesions from previous CT scan at [facility]    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   PONV (postoperative nausea and vomiting)    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   Torn meniscus    left    MEDS:   Current Outpatient Medications on File Prior to Visit  Medication Sig Dispense Refill   Cholecalciferol (VITAMIN D) 50 MCG (2000 UT) CAPS Take 2,000 Units by mouth in the morning and at bedtime.     clobetasol cream (TEMOVATE) 0.05 % Apply 1 Application topically daily as needed (itching).     flecainide (TAMBOCOR) 50 MG tablet Take 1 tablet (50 mg total) by mouth 2 (two) times daily. 180 tablet 3   furosemide (LASIX) 40 MG tablet Take 2 tablets (80 mg total) by mouth daily. 180 tablet 3   hydroxychloroquine (PLAQUENIL) 200 MG tablet Take 200 mg by mouth 2 (two) times daily.     Multiple Vitamin (MULTIVITAMIN WITH  MINERALS) TABS tablet Take 1 tablet by mouth in the morning. One-A-Day for Women 50+     NONFORMULARY OR COMPOUNDED ITEM Vitamin E vaginal suppositories 200u/ml.  One pv two to three times weekly. 36 each 3   Polyethyl Glycol-Propyl Glycol (SYSTANE OP) Place 1 drop into both eyes at bedtime.     POTASSIUM PO Take by mouth.     No current facility-administered medications on file prior to visit.    ALLERGIES: Amlodipine, Diltiazem, Methotrexate, Other, and Quaternium-15  SH:  married, non smoker  Review of Systems  Constitutional: Negative.   Genitourinary:        Vaginal discharge    PHYSICAL EXAMINATION:    BP (!) 145/66 (BP Location: Left Arm, Patient Position: Sitting, Cuff Size: Large)   Pulse 83   Wt 209 lb 6.4 oz (95 kg)   BMI 35.94 kg/m     General appearance: alert, cooperative and appears stated age Lymph:  no inguinal LAD noted  Pelvic: External genitalia:  no lesions              Urethra:  normal appearing urethra with no masses, tenderness or lesions  Bartholins and Skenes: normal                 Vagina: normal mucosa without prolapse or lesions and discharge, white/yellowish             Assessment/Plan: 1. Vaginal discharge - Cervicovaginal ancillary only( [place])  2. Bacterial vaginosis - metroNIDAZOLE (FLAGYL) 500 MG tablet; Take 1 tablet (500 mg total) by mouth 2 (two) times daily for 14 days.  Dispense: 28 tablet; Refill: 0

## 2023-01-29 ENCOUNTER — Encounter (HOSPITAL_COMMUNITY): Payer: Self-pay

## 2023-01-29 ENCOUNTER — Ambulatory Visit (HOSPITAL_COMMUNITY): Admit: 2023-01-29 | Payer: Medicare PPO | Admitting: Cardiology

## 2023-01-29 SURGERY — ATRIAL FIBRILLATION ABLATION
Anesthesia: General

## 2023-02-07 DIAGNOSIS — N760 Acute vaginitis: Secondary | ICD-10-CM | POA: Diagnosis not present

## 2023-02-07 DIAGNOSIS — L820 Inflamed seborrheic keratosis: Secondary | ICD-10-CM | POA: Diagnosis not present

## 2023-02-07 DIAGNOSIS — L821 Other seborrheic keratosis: Secondary | ICD-10-CM | POA: Diagnosis not present

## 2023-02-07 DIAGNOSIS — D229 Melanocytic nevi, unspecified: Secondary | ICD-10-CM | POA: Diagnosis not present

## 2023-02-07 DIAGNOSIS — B9689 Other specified bacterial agents as the cause of diseases classified elsewhere: Secondary | ICD-10-CM | POA: Diagnosis not present

## 2023-02-07 DIAGNOSIS — L578 Other skin changes due to chronic exposure to nonionizing radiation: Secondary | ICD-10-CM | POA: Diagnosis not present

## 2023-02-07 DIAGNOSIS — L814 Other melanin hyperpigmentation: Secondary | ICD-10-CM | POA: Diagnosis not present

## 2023-02-07 DIAGNOSIS — L932 Other local lupus erythematosus: Secondary | ICD-10-CM | POA: Diagnosis not present

## 2023-02-07 DIAGNOSIS — B3731 Acute candidiasis of vulva and vagina: Secondary | ICD-10-CM | POA: Diagnosis not present

## 2023-02-18 NOTE — Progress Notes (Unsigned)
Cardiology Clinic Note   Patient Name: Lindsay Clark Date of Encounter: 02/20/2023  Primary Care Provider:  Laurann Montana, MD Primary Cardiologist:  Olga Millers, MD  Patient Profile    Lindsay Clark 82 year old female presents to the clinic today for follow-up evaluation of her persistent atrial fibrillation  Past Medical History    Past Medical History:  Diagnosis Date   Atherosclerosis    mild cerebral   Atrial fibrillation and flutter (HCC)    Autoimmune disorder (HCC)    Autoimmune encephalitis    Cervical disc disease    with spondylosis   Cervical spondylosis    Chronic dermatitis    eczematous   Colitis    COVID 10/2020   DDD (degenerative disc disease), lumbar    Frozen shoulder    left   Hyperlipidemia    Hypertension    IBS (irritable bowel syndrome)    with diarrhea Pt stated she never had this   Lateral epicondylitis    Lumbar spondylosis    Lupus    Movement disorder    Multiple lesions on CT of brain and spine    reports lesions from previous CT scan at [facility]    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   PONV (postoperative nausea and vomiting)    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   Torn meniscus    left   Past Surgical History:  Procedure Laterality Date   A FLUTTER ABLATION  08/2020   [facility] and had cardioversion at the same time   CATARACT EXTRACTION     COLONOSCOPY     EYE SURGERY Left    HEMORRHOID BANDING     KNEE ARTHROSCOPY Left 08/09/2020   Procedure: LEFT KNEE ARTHROSCOPY MEDIAL AND LATERAL MENISECTONMY AND CONDROPLASTY;  Surgeon: Marcene Corning, MD;  Location: [facility] ORS;  Service: Orthopedics;  Laterality: Left;   LOOP RECORDER INSERTION N/A 07/05/2017   Procedure: LOOP RECORDER INSERTION;  Surgeon: Hillis Range, MD;  Location: [facility] INVASIVE CV LAB;  Service: Cardiovascular;  Laterality: N/A;   TOTAL KNEE ARTHROPLASTY Right 04/24/2022   Procedure: RIGHT TOTAL KNEE ARTHROPLASTY;  Surgeon: Kathryne Hitch,  MD;  Location: [facility] OR;  Service: Orthopedics;  Laterality: Right;   TUBAL LIGATION  1980   WISDOM TOOTH EXTRACTION      Allergies  Allergies  Allergen Reactions   Amlodipine Swelling     leg swelling    Diltiazem Swelling    Bilateral feet   Methotrexate Swelling     swelling in ankles    Other Other (See Comments)    Medication:Methychloroisothlazolinone/methylisothiazilinoneCL +Me-Isothiazolinone Reaction: unknown-patch testing Medication:glyceryl thioglycolate  Reaction:unknown-patch testing   Quaternium-15 Other (See Comments)    unknown reaction-patching testing    History of Present Illness    Kathyrn Sheriff Finkbiner has a PMH of hypertension, persistent atrial fibrillation, coronary artery disease, osteoarthritis, DDD lumbar, anxiety, diastolic dysfunction, syncope, HLD, and is status post right total knee replacement.  Her carotid Dopplers 12/15 showed 1-39% bilateral stenosis.  She wore a cardiac event monitor 10/18 which showed paroxysmal atrial fibrillation and flutter.  She underwent nuclear stress testing 10/18 which showed an EF of 67%, apical thinning and no ischemia.  She was placed on apixaban.  A loop recorder was placed by Dr. Johney Frame for syncope and recurrent palpitations.  She has previously reported episodes of open "heart pounding" however, they were not related to arrhythmia.  Her echocardiogram 2/21 showed normal LV function, G1 DD, mild-moderate LAE.  She had coronary CTA 2/21 which showed a coronary calcium score of 36 and minimal 0-24% plaque in her proximal left main, proximal and mid LAD and proximal circumflex.  She underwent A[facility] ablation at [facility] 5/22.  She was seen by Dr. Elberta Fortis 5/24 with recurrent episodes of atrial fibrillation and was scheduled for repeat ablation.  She was seen in follow-up by Dr. Jens Som 10/29/2022.  During that time she denied dyspnea, chest pain, and syncope.  She did note occasional palpitations.  Her metoprolol was continued at its  current dose and she was scheduled for repeat ablation in October.  She presents to the clinic today for follow-up evaluation and states she has been noticing episodes of decreased heart rate.  She will check her blood pressure with the low heart rates and notices no change.  She has been monitoring her heart rate with her Apple Watch.  It appears that she has only had a few episodes of heart rate in the 40s.  All of her other heart rates are maintaining in the 70s-80s.  Her blood pressure today is 134/78 with a pulse of 85.  We reviewed her history of atrial fibrillation.  She reports that she does not want to undergo repeat ablation procedure until after she decides whether she is going to have left knee replacement.  I recommended that she follow-up with Dr. Elberta Fortis for further review and recommendations.  She wishes to defer EP follow-up at this time and will keep follow-up with Dr. Jens Som.  I have asked her to maintain blood pressure log and avoid triggers for palpitations.  Today she denies chest pain, shortness of breath, lower extremity edema, fatigue,  melena, hematuria, hemoptysis, diaphoresis, weakness, presyncope, syncope, orthopnea, and PND.    Home Medications    Prior to Admission medications   Medication Sig Start Date End Date Taking? Authorizing Provider  Cholecalciferol (VITAMIN D) 50 MCG (2000 UT) CAPS Take 2,000 Units by mouth in the morning and at bedtime.    [provider]  clobetasol cream (TEMOVATE) 0.05 % Apply 1 Application topically daily as needed (itching).    [provider]  ELIQUIS 5 MG TABS tablet TAKE ONE TABLET BY MOUTH TWICE DAILY 01/18/23   [name], MD  flecainide (TAMBOCOR) 50 MG tablet Take 1 tablet (50 mg total) by mouth 2 (two) times daily. 10/01/22   Camnitz, Andree Coss, MD  furosemide (LASIX) 40 MG tablet Take 2 tablets (80 mg total) by mouth daily. 09/10/22   [name], MD  hydroxychloroquine (PLAQUENIL) 200 MG tablet  Take 200 mg by mouth 2 (two) times daily.    [provider]  Multiple Vitamin (MULTIVITAMIN WITH MINERALS) TABS tablet Take 1 tablet by mouth in the morning. One-A-Day for Women 50+    [provider]  NONFORMULARY OR COMPOUNDED ITEM Vitamin E vaginal suppositories 200u/ml.  One pv two to three times weekly. 12/27/22   Jerene Bears, MD  Polyethyl Glycol-Propyl Glycol (SYSTANE OP) Place 1 drop into both eyes at bedtime.    [provider]  POTASSIUM PO Take by mouth.    [provider]    Family History    Family History  Problem Relation Age of Onset   Cancer Mother 40       colon cancer   Stroke Mother    Colon cancer Mother 47   Heart attack Father 11   Emphysema Father    Cancer Maternal Grandmother 78  uterine   Thyroid disease Maternal Grandmother    Thrombosis Maternal Grandfather    Thyroid disease Son    She indicated that her mother is deceased. She indicated that her father is deceased. She indicated that her maternal grandmother is deceased. She indicated that her maternal grandfather is deceased. She indicated that her paternal grandmother is deceased. She indicated that her paternal grandfather is deceased. She indicated that the status of her son is unknown.  Social History    Social History   Socioeconomic History   Marital status: Married    Spouse name: Brett Canales   Number of children: 2   Years of education: MAx2   Highest education level: Not on file  Occupational History   Occupation: Retired    Associate Professor: [employer]  Tobacco Use   Smoking status: Never   Smokeless tobacco: Never  Vaping Use   Vaping status: Never Used  Substance and Sexual Activity   Alcohol use: Yes    Alcohol/week: 4.0 standard drinks of alcohol    Types: 4 Glasses of wine per week    Comment: 7 glasses of wine weekly   Drug use: No   Sexual activity: Not Currently  Other Topics Concern   Not on file  Social History Narrative   Patient lives at  home with her spouse.   Caffeine Use: 2-3 cups daily   Social Determinants of Health   Financial Resource Strain: Not on file  Food Insecurity: No Food Insecurity (04/24/2022)   Hunger Vital Sign    Worried About Running Out of Food in the Last Year: Never true    Ran Out of Food in the Last Year: Never true  Transportation Needs: No Transportation Needs (04/24/2022)   PRAPARE - Administrator, Civil Service (Medical): No    Lack of Transportation (Non-Medical): No  Physical Activity: Not on file  Stress: Not on file  Social Connections: Unknown (09/05/2021)   Received from [facility], [facility]   Social Network    Social Network: Not on file  Intimate Partner Violence: Not At Risk (04/24/2022)   Humiliation, Afraid, Rape, and Kick questionnaire    Fear of Current or Ex-Partner: No    Emotionally Abused: No    Physically Abused: No    Sexually Abused: No     Review of Systems    General:  No chills, fever, night sweats or weight changes.  Cardiovascular:  No chest pain, dyspnea on exertion, edema, orthopnea, palpitations, paroxysmal nocturnal dyspnea. Dermatological: No rash, lesions/masses Respiratory: No cough, dyspnea Urologic: No hematuria, dysuria Abdominal:   No nausea, vomiting, diarrhea, bright red blood per rectum, melena, or hematemesis Neurologic:  No visual changes, wkns, changes in mental status. All other systems reviewed and are otherwise negative except as noted above.  Physical Exam    VS:  BP 134/78   Pulse 85   Ht 5' 3.9" (1.623 m)   Wt 209 lb 12.8 oz (95.2 kg)   SpO2 96%   BMI 36.12 kg/m  , BMI Body mass index is 36.12 kg/m. GEN: Well nourished, well developed, in no acute distress. HEENT: normal. Neck: Supple, no JVD, carotid bruits, or masses. Cardiac: Irregularly irregular, no murmurs, rubs, or gallops. No clubbing, cyanosis, edema.  Radials/DP/PT 2+ and equal bilaterally.  Respiratory:  Respirations regular and unlabored, clear  to auscultation bilaterally. GI: Soft, nontender, nondistended, BS + x 4. MS: no deformity or atrophy. Skin: warm and dry, no rash. Neuro:  Strength and sensation  are intact. Psych: Normal affect.  Accessory Clinical Findings    Recent Labs: 04/26/2022: Magnesium 2.0 04/27/2022: BUN 13; Creatinine, Ser 0.64; Hemoglobin 10.5; Platelets 167; Potassium 3.6; Sodium 138   Recent Lipid Panel No results found for: "CHOL", "TRIG", "HDL", "CHOLHDL", "VLDL", "LDLCALC", "LDLDIRECT"       ECG personally reviewed by me today-none today.    Echocardiogram 05/25/2019  IMPRESSIONS     1. Left ventricular ejection fraction, by visual estimation, is 60 to  65%. The left ventricle has normal function. There is no left ventricular  hypertrophy.   2. The left ventricle has no regional wall motion abnormalities.   3. Left ventricular diastolic parameters are consistent with Grade I  diastolic dysfunction (impaired relaxation).   4. Elevated left ventricular end-diastolic pressure.   5. Global right ventricle has normal systolic function.The right  ventricular size is normal. No increase in right ventricular wall  thickness.   6. Left atrial size was mild-moderately dilated.   7. Right atrial size was normal.   8. The mitral valve is normal in structure. No evidence of mitral valve  regurgitation. No evidence of mitral stenosis.   9. The tricuspid valve is normal in structure. Tricuspid valve  regurgitation is trivial.  10. The aortic valve is tricuspid. Aortic valve regurgitation is not  visualized. Mild aortic valve sclerosis without stenosis.  11. The pulmonic valve was normal in structure. Pulmonic valve  regurgitation is not visualized.  12. 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  13. Normal pulmonary artery systolic pressure.  14. The inferior vena cava is normal in size with greater than 50%  respiratory variability, suggesting right atrial pressure of 3 mmHg.    FINDINGS   Left Ventricle: Left ventricular ejection fraction, by visual estimation,  is 60 to 65%. The left ventricle has normal function. The left ventricle  has no regional wall motion abnormalities. There is no left ventricular  hypertrophy. Left ventricular  diastolic parameters are consistent with Grade I diastolic dysfunction  (impaired relaxation). Elevated left ventricular end-diastolic pressure.   Right Ventricle: The right ventricular size is normal. No increase in  right ventricular wall thickness. Global RV systolic function is has  normal systolic function. The tricuspid regurgitant velocity is 1.94 m/s,  and with an assumed right atrial pressure   of 3 mmHg, the estimated right ventricular systolic pressure is normal at  18.1 mmHg.   Left Atrium: Left atrial size was mild-moderately dilated.   Right Atrium: Right atrial size was normal in size   Pericardium: There is no evidence of pericardial effusion.   Mitral Valve: The mitral valve is normal in structure. No evidence of  mitral valve regurgitation. No evidence of mitral valve stenosis by  observation.   Tricuspid Valve: The tricuspid valve is normal in structure. Tricuspid  valve regurgitation is trivial.   Aortic Valve: The aortic valve is tricuspid. Aortic valve regurgitation is  not visualized. Mild aortic valve sclerosis is present, with no evidence  of aortic valve stenosis.   Pulmonic Valve: The pulmonic valve was normal in structure. Pulmonic valve  regurgitation is not visualized. Pulmonic regurgitation is not visualized.   Aorta: The aortic root, ascending aorta and aortic arch are all  structurally normal, with no evidence of dilitation or obstruction and  aortic dilatation noted. There is mild dilatation of the ascending aorta  measuring 39 mm.   Venous: The inferior vena cava is normal in size with greater than 50%  respiratory variability, suggesting right atrial pressure of 3 mmHg.    IAS/Shunts: No atrial level shunt detected by color flow Doppler. There is  no evidence of a patent foramen ovale. No ventricular septal defect is  seen or detected. There is no evidence of an atrial septal defect.       Assessment & Plan   1.  Atrial fibrillation-heart rate today 85.  Underwent ablation at [facility] 5/22.  Continues to follow with Dr. Elberta Fortis and was scheduled for repeat ablation this October.  Reports compliance with apixaban. Continue metoprolol, apixaban Avoid triggers caffeine, chocolate, EtOH, dehydration etc. Recommended follow-up with Dr. Steva Colder to defer at this time Continue to monitor  Coronary artery disease-no chest pain today.  Coronary CTA 2/21 showed coronary calcium score of 36 and minimal 0-24% proximal left main, proximal and mid LAD, and proximal circumflex. Heart healthy low-sodium diet Continue current medical therapy Maintain physical activity as tolerated  Hyperlipidemia-LDL 104 on 10/30/22.  Wishes to defer lipid-lowering medication.  No aspirin due to apixaban High-fiber diet Increase physical activity as tolerated  Essential hypertension-BP today 134/78. Maintain blood pressure log Heart healthy low-sodium diet  Lower extremity edema-weight stable.  Euvolemic today. Daily weights Continue furosemide Elevate lower extremities when not active Watson support stocking sheet given  Disposition: Follow-up with Dr. Jens Som as scheduled.   Thomasene Ripple. Tadao Emig NP-C     02/20/2023, 8:21 AM [facility] [address] Office [phone] Fax [phone]    I spent 15 minutes examining this patient, reviewing medications, and using patient centered shared decision making involving her cardiac care.   I spent greater than 20 minutes reviewing her past medical history,  medications, and prior cardiac tests.

## 2023-02-20 ENCOUNTER — Encounter: Payer: Self-pay | Admitting: General Practice

## 2023-02-20 ENCOUNTER — Ambulatory Visit: Payer: Medicare PPO | Attending: General Practice | Admitting: General Practice

## 2023-02-20 VITALS — BP 134/78 | HR 85 | Ht 63.9 in | Wt 209.8 lb

## 2023-02-20 DIAGNOSIS — E782 Mixed hyperlipidemia: Secondary | ICD-10-CM | POA: Diagnosis not present

## 2023-02-20 DIAGNOSIS — I2510 Atherosclerotic heart disease of native coronary artery without angina pectoris: Secondary | ICD-10-CM

## 2023-02-20 DIAGNOSIS — R609 Edema, unspecified: Secondary | ICD-10-CM

## 2023-02-20 DIAGNOSIS — I4819 Other persistent atrial fibrillation: Secondary | ICD-10-CM | POA: Diagnosis not present

## 2023-02-20 DIAGNOSIS — I10 Essential (primary) hypertension: Secondary | ICD-10-CM

## 2023-02-20 NOTE — Patient Instructions (Addendum)
Medication Instructions:  No changes *If you need a refill on your cardiac medications before your next appointment, please call your pharmacy*   Lab Work: No labs If you have labs (blood work) drawn today and your tests are completely normal, you will receive your results only by: MyChart Message (if you have MyChart) OR A paper copy in the mail If you have any lab test that is abnormal or we need to change your treatment, we will call you to review the results.   Testing/Procedures: No testing   Follow-Up: At [facility], you and your health needs are our priority.  As part of our continuing mission to provide you with exceptional heart care, we have created designated Provider Care Teams.  These Care Teams include your primary Cardiologist (physician) and Advanced Practice Providers (APPs -  Physician Assistants and Nurse Practitioners) who all work together to provide you with the care you need, when you need it.  We recommend signing up for the patient portal called "MyChart".  Sign up information is provided on this After Visit Summary.  MyChart is used to connect with patients for Virtual Visits (Telemedicine).  Patients are able to view lab/test results, encounter notes, upcoming appointments, etc.  Non-urgent messages can be sent to your provider as well.   To learn more about what you can do with MyChart, go to [email or web address].    Your next appointment:   March 24th at 9:00 am with Olga Millers, MD   Other Instructions Just remember to avoid heart rate stresses like excess caffeine, excess alcoholic products, excess chocolate.

## 2023-03-25 DIAGNOSIS — G249 Dystonia, unspecified: Secondary | ICD-10-CM | POA: Diagnosis not present

## 2023-03-27 ENCOUNTER — Ambulatory Visit (HOSPITAL_BASED_OUTPATIENT_CLINIC_OR_DEPARTMENT_OTHER): Payer: Medicare PPO | Admitting: Certified Nurse Midwife

## 2023-03-27 ENCOUNTER — Encounter (HOSPITAL_BASED_OUTPATIENT_CLINIC_OR_DEPARTMENT_OTHER): Payer: Self-pay | Admitting: Certified Nurse Midwife

## 2023-03-27 ENCOUNTER — Telehealth (HOSPITAL_BASED_OUTPATIENT_CLINIC_OR_DEPARTMENT_OTHER): Payer: Self-pay | Admitting: Certified Nurse Midwife

## 2023-03-27 VITALS — BP 142/71 | HR 77 | Ht 63.9 in | Wt 211.6 lb

## 2023-03-27 DIAGNOSIS — B3731 Acute candidiasis of vulva and vagina: Secondary | ICD-10-CM | POA: Diagnosis not present

## 2023-03-27 DIAGNOSIS — N952 Postmenopausal atrophic vaginitis: Secondary | ICD-10-CM | POA: Diagnosis not present

## 2023-03-27 DIAGNOSIS — Z780 Asymptomatic menopausal state: Secondary | ICD-10-CM | POA: Diagnosis not present

## 2023-03-27 DIAGNOSIS — N898 Other specified noninflammatory disorders of vagina: Secondary | ICD-10-CM | POA: Diagnosis not present

## 2023-03-27 MED ORDER — CLINDAMYCIN HCL 300 MG PO CAPS
300.0000 mg | ORAL_CAPSULE | Freq: Two times a day (BID) | ORAL | 0 refills | Status: AC
Start: 2023-03-27 — End: 2023-04-03

## 2023-03-27 MED ORDER — PREMARIN 0.625 MG/GM VA CREA
TOPICAL_CREAM | VAGINAL | 0 refills | Status: DC
Start: 2023-03-28 — End: 2023-04-01

## 2023-03-27 MED ORDER — ESTRADIOL 0.1 MG/GM VA CREA: 1.0000 | TOPICAL_CREAM | Freq: Every day | VAGINAL | 3 refills | Status: DC

## 2023-03-27 MED ORDER — FLUCONAZOLE 150 MG PO TABS: ORAL_TABLET | ORAL | 0 refills | Status: DC

## 2023-03-27 NOTE — Progress Notes (Unsigned)
  Pt c/o vaginal discharge with odor, itching including external vulvar itching. She is not sexually active. Reports she never had an issue with Bacterial Vaginitis and vulvovaginal candidiasis until this year.   She     Patient reports she is allergic to Metronidazole and cannot take Metronidazole po or vaginally. Pt denies allergy to Clindamycin and is agreeable to Clindamycin therapy but desires oral and not vaginal.  Consider probiotics   Discussed recurrent BV could be related to postmenopausal vaginal atrophy. Discussed that it is possible that vaginal estrogen therapy (when therapeutic) may decrease recurrence of imbalances in pH of vagina leading to BV and VVC.

## 2023-03-28 DIAGNOSIS — N952 Postmenopausal atrophic vaginitis: Secondary | ICD-10-CM | POA: Insufficient documentation

## 2023-03-28 DIAGNOSIS — N898 Other specified noninflammatory disorders of vagina: Secondary | ICD-10-CM | POA: Insufficient documentation

## 2023-03-28 DIAGNOSIS — Z780 Asymptomatic menopausal state: Secondary | ICD-10-CM | POA: Insufficient documentation

## 2023-03-28 DIAGNOSIS — B3731 Acute candidiasis of vulva and vagina: Secondary | ICD-10-CM | POA: Insufficient documentation

## 2023-03-28 NOTE — Telephone Encounter (Signed)
Telephone call to [facility] completed. Lindsay Clark

## 2023-03-29 ENCOUNTER — Other Ambulatory Visit (HOSPITAL_BASED_OUTPATIENT_CLINIC_OR_DEPARTMENT_OTHER): Payer: Self-pay | Admitting: Certified Nurse Midwife

## 2023-03-29 DIAGNOSIS — N952 Postmenopausal atrophic vaginitis: Secondary | ICD-10-CM

## 2023-03-29 LAB — NUSWAB VG PLUS+MYCOPLASMAS,NAA
Candida albicans, NAA: NEGATIVE
Candida glabrata, NAA: NEGATIVE
Chlamydia trachomatis, NAA: NEGATIVE
Mycoplasma genitalium NAA: NEGATIVE
Mycoplasma hominis NAA: NEGATIVE
Neisseria gonorrhoeae, NAA: NEGATIVE
Trich vag by NAA: NEGATIVE
Ureaplasma spp NAA: NEGATIVE

## 2023-04-01 ENCOUNTER — Other Ambulatory Visit (HOSPITAL_BASED_OUTPATIENT_CLINIC_OR_DEPARTMENT_OTHER): Payer: Self-pay | Admitting: Certified Nurse Midwife

## 2023-04-02 ENCOUNTER — Other Ambulatory Visit (HOSPITAL_BASED_OUTPATIENT_CLINIC_OR_DEPARTMENT_OTHER): Payer: Self-pay | Admitting: Certified Nurse Midwife

## 2023-04-04 ENCOUNTER — Encounter (INDEPENDENT_AMBULATORY_CARE_PROVIDER_SITE_OTHER): Payer: Medicare PPO | Admitting: Ophthalmology

## 2023-04-04 DIAGNOSIS — H35033 Hypertensive retinopathy, bilateral: Secondary | ICD-10-CM | POA: Diagnosis not present

## 2023-04-04 DIAGNOSIS — I10 Essential (primary) hypertension: Secondary | ICD-10-CM | POA: Diagnosis not present

## 2023-04-04 DIAGNOSIS — H43813 Vitreous degeneration, bilateral: Secondary | ICD-10-CM

## 2023-04-04 DIAGNOSIS — H353132 Nonexudative age-related macular degeneration, bilateral, intermediate dry stage: Secondary | ICD-10-CM

## 2023-04-08 ENCOUNTER — Ambulatory Visit: Payer: Medicare PPO | Admitting: Physician Assistant

## 2023-04-08 ENCOUNTER — Encounter: Payer: Self-pay | Admitting: Physician Assistant

## 2023-04-08 ENCOUNTER — Other Ambulatory Visit (INDEPENDENT_AMBULATORY_CARE_PROVIDER_SITE_OTHER): Payer: Medicare PPO

## 2023-04-08 VITALS — Ht 64.37 in | Wt 208.2 lb

## 2023-04-08 DIAGNOSIS — M25562 Pain in left knee: Secondary | ICD-10-CM | POA: Diagnosis not present

## 2023-04-08 DIAGNOSIS — G8929 Other chronic pain: Secondary | ICD-10-CM | POA: Diagnosis not present

## 2023-04-08 DIAGNOSIS — M1712 Unilateral primary osteoarthritis, left knee: Secondary | ICD-10-CM | POA: Diagnosis not present

## 2023-04-08 MED ORDER — LIDOCAINE HCL 1 % IJ SOLN
3.0000 mL | INTRAMUSCULAR | Status: AC | PRN
Start: 2023-04-08 — End: 2023-04-08
  Administered 2023-04-08: 3 mL

## 2023-04-08 MED ORDER — METHYLPREDNISOLONE ACETATE 40 MG/ML IJ SUSP
40.0000 mg | INTRAMUSCULAR | Status: AC | PRN
Start: 2023-04-08 — End: 2023-04-08
  Administered 2023-04-08: 40 mg via INTRA_ARTICULAR

## 2023-04-08 NOTE — Progress Notes (Signed)
   Procedure Note  Patient: Lindsay Clark             Date of Birth: [birth year]           MRN: [id number]             Visit Date: 04/08/2023 HPI Mrs. Rasor comes in today wanting her left knee aspirated and injected.  Last injection was 11/12/2022.  She is going to [place] to see the [employer].  She has had no injury to the knee.  She does want to undergo left total knee arthroplasty in mid February.  States that her right knee is still with decreased sensation to the touch.  She also would like to discuss left knee surgery she does not want to have the knee immobilizer postop.  Review of systems: Denies new injury to the left knee.  No fevers chills.  Physical exam: General Well-developed well-nourished female in no acute distress ambulates without assistive device but it positive antalgic gait. Left knee good range of motion.  No abnormal warmth erythema.  Positive effusion. Radiographs: AP and lateral views left knee: Shows osteophytes off the medial joint line with near bone-on-bone.  Severe patellofemoral arthritic changes mild to moderate lateral compartmental changes with osteophytes off the lateral compartment.  Knee is well located.  No acute fracture.  Procedures: Visit Diagnoses:  1. Chronic pain of left knee     Large Joint Inj: L knee on 04/08/2023 11:39 AM Indications: pain Details: 22 G 1.5 in needle, anterolateral approach  Arthrogram: No  Medications: 3 mL lidocaine 1 %; 40 mg methylPREDNISolone acetate 40 MG/ML Aspirate: 13 mL yellow Outcome: tolerated well, no immediate complications Procedure, treatment alternatives, risks and benefits explained, specific risks discussed. Consent was given by the patient. Immediately prior to procedure a time out was called to verify the correct patient, procedure, equipment, support staff and site/side marked as required. Patient was prepped and draped in the usual sterile fashion.      Plan: Will get her on the  schedule for a left total knee replacement sometime in February.  She understands the risk benefits of surgery.  Understands postop protocol.  Will plan on using the knee immobilizer postop but did discuss the reason the knee immobilizer is used.  Questions were encouraged and answered at length.

## 2023-05-01 NOTE — Progress Notes (Signed)
 Chief Complaint: flatulence Primary GI Doctor: Dr Avram  HPI: 83 year old white woman with a history of SIBO and prolapsed hemorrhoids status post banding and chronic recurrent fecal smearing and malodorous gas that escapes.  When she was here in November there was some confusion about whether or not she had been treated a second time for Xifaxan  but at any rate she ended up taking a second round and thinks she is having a little bit less gas problem.  She is concerned because she has been having some flat stool since then but she also has normal-appearing bowel movements as well.  She continues to have some slight leakage and smearing of stool that is bothersome. Elderly mother had colon cancer and these bowel habit changes concerned her. We have attempted hemorrhoidal banding to treat this and it was not helpful.   November 2017 colonoscopy - One 5 mm polyp in the rectum, removed with a cold snare. Resected and retrieved. - One 2 mm polyp in the descending colon, removed with a cold biopsy forceps. Resected and retrieved. - The examination was otherwise normal on direct and retroflexion views.   Polyps were an adenoma and a mucosal polyp no recall planned due to age and findings  Lactulose hydrogen breath test abnormal October 11 2227 ppm increase hydrogen 8 ppm increase methane 37 ppm increase total   On 05/24/2021 patient was last seen in the GI clinic by Dr. Avram with complaints of fecal smearing.  At that time he discussed with her taking Benefiber once daily for a month.  Also discussed that pelvic physical therapy is another option.  She also complained of gas problems that had improved with Xifaxan  treatment for SIBO.  Interval History    Patient presents with main complaint of gas that is not painful, but uncontrollable.  Patient reports she was in [place]  for 9 days to see the [place] parade with her husband and reports she had no issues with gas. Once she returned [facility] and started eating the food there her gas returned. She states he cooks with a lot of garlic and onions. Reports her bowels are regular. No diarrhea or constipation. No bloating. Patient denies nausea, vomiting, or weight loss. Patient denies rectal bleeding. She enquires today about getting tested for allergies to foods. She does not think her symptoms are consistent with Sibo. At some point it mentioned possibly trialing Creon  because her stool elastase was low, but she does not recall taking it.   Wt Readings from Last 3 Encounters:  05/02/23 210 lb 12.8 oz (95.6 kg)  04/08/23 208 lb 3.2 oz (94.4 kg)  03/27/23 211 lb 9.6 oz (96 kg)    Past Medical History:  Diagnosis Date   Atherosclerosis    mild cerebral   Atrial fibrillation and flutter (HCC)    Autoimmune disorder (HCC)    Autoimmune encephalitis    Cervical disc disease    with spondylosis   Cervical spondylosis    Chronic dermatitis    eczematous   Colitis    COVID 10/2020   DDD (degenerative disc disease), lumbar    Frozen shoulder    left   Hyperlipidemia    Hypertension    IBS (irritable bowel syndrome)    with diarrhea Pt stated she never had this   Lateral epicondylitis    Lumbar spondylosis    Lupus    Movement disorder    Multiple lesions on CT of brain and spine  reports lesions from previous CT scan at [facility]    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   PONV (postoperative nausea and vomiting)    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   Torn meniscus    left    Past Surgical History:  Procedure Laterality Date   A FLUTTER ABLATION  08/2020   [facility] and had cardioversion at the same time   CATARACT EXTRACTION     COLONOSCOPY     EYE SURGERY Left    HEMORRHOID BANDING     KNEE ARTHROSCOPY Left 08/09/2020   Procedure: LEFT KNEE ARTHROSCOPY MEDIAL AND LATERAL MENISECTONMY AND CONDROPLASTY;  Surgeon: Sheril Coy, MD;  Location: [facility];  Service: Orthopedics;   Laterality: Left;   LOOP RECORDER INSERTION N/A 07/05/2017   Procedure: LOOP RECORDER INSERTION;  Surgeon: Kelsie Agent, MD;  Location: [facility] INVASIVE [facility];  Service: Cardiovascular;  Laterality: N/A;   TOTAL KNEE ARTHROPLASTY Right 04/24/2022   Procedure: RIGHT TOTAL KNEE ARTHROPLASTY;  Surgeon: Vernetta Lonni GRADE, MD;  Location: [facility] OR;  Service: Orthopedics;  Laterality: Right;   TUBAL LIGATION  1980   WISDOM TOOTH EXTRACTION      Current Outpatient Medications  Medication Sig Dispense Refill   Cholecalciferol  (VITAMIN D ) 50 MCG (2000 UT) CAPS Take 2,000 Units by mouth in the morning and at bedtime.     clobetasol  cream (TEMOVATE ) 0.05 % Apply 1 Application topically daily as needed (itching).     ELIQUIS  5 MG TABS tablet TAKE ONE TABLET BY MOUTH TWICE DAILY 60 tablet 5   furosemide  (LASIX ) 40 MG tablet Take 2 tablets (80 mg total) by mouth daily. 180 tablet 3   hydroxychloroquine  (PLAQUENIL ) 200 MG tablet Take 200 mg by mouth 2 (two) times daily.     Multiple Vitamin (MULTIVITAMIN WITH MINERALS) TABS tablet Take 1 tablet by mouth in the morning. One-A-Day for Women 50+     NONFORMULARY OR COMPOUNDED ITEM Vitamin E vaginal suppositories 200u/ml.  One pv two to three times weekly. 36 each 3   Polyethyl Glycol-Propyl Glycol (SYSTANE OP) Place 1 drop into both eyes at bedtime.     POTASSIUM PO Take by mouth.     No current facility-administered medications for this visit.   Allergies as of 05/02/2023 - Review Complete 05/02/2023  Allergen Reaction Noted   Amlodipine Swelling 04/19/2014   Diltiazem  Swelling 07/15/2020   Methotrexate Swelling 03/29/2020   Metronidazole  Swelling 03/29/2020   Other Other (See Comments) 03/05/2016   Quaternium-15 Other (See Comments) 03/05/2016   Family History  Problem Relation Age of Onset   Cancer Mother 35       colon cancer   Stroke Mother    Colon cancer Mother 22   Heart attack Father 58   Emphysema Father    Cancer Maternal Grandmother 74        uterine   Thyroid  disease Maternal Grandmother    Thrombosis Maternal Grandfather    Thyroid  disease Son    Review of Systems:    Constitutional: No weight loss, fever, chills, weakness or fatigue HEENT: Eyes: No change in vision               Ears, Nose, Throat:  No change in hearing or congestion Skin: No rash or itching Cardiovascular: No chest pain, chest pressure or palpitations   Respiratory: No SOB or cough Gastrointestinal: See HPI and otherwise negative Genitourinary: No dysuria or change in urinary frequency Neurological: No headache, dizziness or syncope Musculoskeletal: No new  muscle or joint pain Hematologic: No bleeding or bruising Psychiatric: No history of depression or anxiety   Physical Exam:  Vital signs: BP 120/70   Pulse 80   Ht 5' 4 (1.626 m)   Wt 210 lb 12.8 oz (95.6 kg)   SpO2 93%   BMI 36.18 kg/m   Constitutional: Pleasant Caucasian female appears to be in NAD, Well developed, Well nourished, alert and cooperative Respiratory: Respirations even and unlabored. Lungs clear to auscultation bilaterally.   No wheezes, crackles, or rhonchi.  Cardiovascular: Normal S1, S2. Regular rate and rhythm. No peripheral edema, cyanosis or pallor.  Gastrointestinal:  Soft, nondistended, nontender. No rebound or guarding. Normal bowel sounds. No appreciable masses or hepatomegaly. Rectal:  Not performed.  Skin:   Dry and intact without significant lesions or rashes. Psychiatric: Oriented to person, place and time. Demonstrates good judgement and reason without abnormal affect or behaviors.  RELEVANT LABS AND IMAGING: CBC    Latest Ref Rng & Units 04/27/2022    4:38 AM 04/12/2022   11:47 AM 07/29/2020   11:47 AM  CBC  WBC 4.0 - 10.5 K/uL 8.6  5.9  6.0   Hemoglobin 12.0 - 15.0 g/dL 89.4  86.5  85.2   Hematocrit 36.0 - 46.0 % 31.6  41.3  45.5   Platelets 150 - 400 K/uL 167  198  200     CMP     Latest Ref Rng & Units 04/27/2022    4:38 AM 04/26/2022    8:52  AM 04/25/2022    6:28 AM  CMP  Glucose 70 - 99 mg/dL 881  886  872   BUN 8 - 23 mg/dL 13  11  11    Creatinine 0.44 - 1.00 mg/dL 9.35  9.24  9.43   Sodium 135 - 145 mmol/L 138  135  138   Potassium 3.5 - 5.1 mmol/L 3.6  2.6  2.9   Chloride 98 - 111 mmol/L 99  92  97   CO2 22 - 32 mmol/L 31  32  29   Calcium 8.9 - 10.3 mg/dL 7.9  8.0  8.1     Assessment: Encounter Diagnoses  Name Primary?   Flatulence Yes   Small intestinal bacterial overgrowth (SIBO)     83 year old female patient who presents with main complaint of gas. She has been diagnosed and treated in past with Sibo, but states she does not feel this is the issue. We discussed the low stool elastase in the past and trialing Creon  36,000 units 2 capsules with meals and 1 with snacks before paying out of pocket for a test to check for food sensitives. I also recommended she look over the Low FOD map diet sheet- she declined at this time. Reinforced reducing added sugars in diet.  Plan: -Try empiric Creon  36K 2 with meals 1 with snacks for 1 mth -If no improvement consider [employer] IBS testing to evaluate for trigger foods. Pt agrees to paying out of pocket.   Thank you for the courtesy of this consult. Please call me with any questions or concerns.   Nelda Luckey, FNP-C [facility] 05/02/2023, 12:03 PM  Cc: Teresa Channel, MD

## 2023-05-02 ENCOUNTER — Telehealth: Payer: Self-pay

## 2023-05-02 ENCOUNTER — Encounter: Payer: Self-pay | Admitting: Gastroenterology

## 2023-05-02 ENCOUNTER — Ambulatory Visit: Payer: Medicare PPO | Admitting: Gastroenterology

## 2023-05-02 VITALS — BP 120/70 | HR 80 | Ht 64.0 in | Wt 210.8 lb

## 2023-05-02 DIAGNOSIS — R143 Flatulence: Secondary | ICD-10-CM | POA: Diagnosis not present

## 2023-05-02 DIAGNOSIS — K638219 Small intestinal bacterial overgrowth, unspecified: Secondary | ICD-10-CM | POA: Diagnosis not present

## 2023-05-02 MED ORDER — PANCRELIPASE (LIP-PROT-AMYL) 36000-114000 UNITS PO CPEP: ORAL_CAPSULE | ORAL | 0 refills | Status: DC

## 2023-05-02 NOTE — Patient Instructions (Signed)
 We have sent the following medications to your pharmacy for you to pick up at your convenience: Creon  - Written prescription given.   Creon  - Take 2 capsule by mouth with each meal, and 1 capsule by mouth with snack daily ( up too 2 snacks).   Send my chart message in 2-3 weeks with updated symptoms and let Deanna know how your doing with Creon .   _______________________________________________________  If your blood pressure at your visit was 140/90 or greater, please contact your primary care physician to follow up on this.  _______________________________________________________  If you are age 81 or older, your body mass index should be between 23-30. Your Body mass index is 36.18 kg/m. If this is out of the aforementioned range listed, please consider follow up with your Primary Care Provider.  If you are age 24 or younger, your body mass index should be between 19-25. Your Body mass index is 36.18 kg/m. If this is out of the aformentioned range listed, please consider follow up with your Primary Care Provider.   ________________________________________________________  The [facility] providers would like to encourage you to use MYCHART to communicate with providers for non-urgent requests or questions.  Due to long hold times on the telephone, sending your provider a message by [facility] may be a faster and more efficient way to get a response.  Please allow 48 business hours for a response.  Please remember that this is for non-urgent requests.  _______________________________________________________  Due to recent changes in healthcare laws, you may see the results of your imaging and laboratory studies on MyChart before your provider has had a chance to review them.  We understand that in some cases there may be results that are confusing or concerning to you. Not all laboratory results come back in the same time frame and the provider may be waiting for multiple results in order to  interpret others.  Please give us  48 hours in order for your provider to thoroughly review all the results before contacting the office for clarification of your results.   Thank you for choosing me and [facility].  Deanna May, NP

## 2023-05-02 NOTE — Telephone Encounter (Signed)
I called patient and left voice mail for return call to discuss scheduling surgery.

## 2023-05-07 DIAGNOSIS — I480 Paroxysmal atrial fibrillation: Secondary | ICD-10-CM | POA: Diagnosis not present

## 2023-05-07 DIAGNOSIS — D849 Immunodeficiency, unspecified: Secondary | ICD-10-CM | POA: Diagnosis not present

## 2023-05-07 DIAGNOSIS — M359 Systemic involvement of connective tissue, unspecified: Secondary | ICD-10-CM | POA: Diagnosis not present

## 2023-05-07 DIAGNOSIS — I700 Atherosclerosis of aorta: Secondary | ICD-10-CM | POA: Diagnosis not present

## 2023-05-07 DIAGNOSIS — I672 Cerebral atherosclerosis: Secondary | ICD-10-CM | POA: Diagnosis not present

## 2023-05-07 DIAGNOSIS — E785 Hyperlipidemia, unspecified: Secondary | ICD-10-CM | POA: Diagnosis not present

## 2023-05-07 DIAGNOSIS — I2510 Atherosclerotic heart disease of native coronary artery without angina pectoris: Secondary | ICD-10-CM | POA: Diagnosis not present

## 2023-05-07 DIAGNOSIS — D6869 Other thrombophilia: Secondary | ICD-10-CM | POA: Diagnosis not present

## 2023-05-07 DIAGNOSIS — I10 Essential (primary) hypertension: Secondary | ICD-10-CM | POA: Diagnosis not present

## 2023-05-12 ENCOUNTER — Other Ambulatory Visit: Payer: Self-pay | Admitting: Medical Genetics

## 2023-05-20 ENCOUNTER — Other Ambulatory Visit (HOSPITAL_BASED_OUTPATIENT_CLINIC_OR_DEPARTMENT_OTHER): Payer: Self-pay

## 2023-05-20 MED ORDER — PANCRELIPASE (LIP-PROT-AMYL) 36000-114000 UNITS PO CPEP
ORAL_CAPSULE | ORAL | 2 refills | Status: DC
  Filled 2023-05-20: qty 300, 37d supply, fill #0

## 2023-05-20 MED ORDER — PANCRELIPASE (LIP-PROT-AMYL) 36000-114000 UNITS PO CPEP
ORAL_CAPSULE | ORAL | 2 refills | Status: AC
  Filled 2023-05-20: qty 300, 37d supply, fill #0

## 2023-05-20 NOTE — Addendum Note (Signed)
Addended by: Richardson Chiquito on: 05/20/2023 04:25 PM   Modules accepted: Orders

## 2023-05-27 ENCOUNTER — Other Ambulatory Visit: Payer: Self-pay | Admitting: Cardiology

## 2023-05-29 ENCOUNTER — Encounter: Payer: Self-pay | Admitting: Orthopaedic Surgery

## 2023-05-29 NOTE — Telephone Encounter (Signed)
*  STAT* If patient is at the pharmacy, call can be transferred to refill team.   1. Which medications need to be refilled? (please list name of each medication and dose if known) Potassium   2. Would you like to learn more about the convenience, safety, & potential cost savings by using the [facility]?     3. Are you open to using the [facility] (Type [facility]. ).   4. Which pharmacy/location (including street and city if local pharmacy) is medication to be sent to? [facility] [address],[place]   5. Do they need a 30 day or 90 day supply? 90 days and refills

## 2023-05-30 ENCOUNTER — Telehealth: Payer: Self-pay | Admitting: Cardiology

## 2023-05-30 NOTE — Telephone Encounter (Signed)
 LVM to CB.  Patient walked in very close to 5:00 pm today with a request for a Refill of potassium.  This needs to be send to [facility].  Unsure of the amount/dose pt should be on.  Will forward to Pharm D and NP that last saw her.

## 2023-05-30 NOTE — Telephone Encounter (Signed)
 Patient came in as a Walk - in and is trying to get a refill of Potassium .  Pharmacy is : [facility]   Thank you

## 2023-06-03 ENCOUNTER — Encounter: Payer: Self-pay | Admitting: Cardiology

## 2023-06-03 NOTE — Telephone Encounter (Signed)
 Left message for patient to call or send my chart message of current potassium prescription so it can be refilled.

## 2023-06-04 MED ORDER — POTASSIUM CHLORIDE ER 10 MEQ PO TBCR: 10.0000 meq | EXTENDED_RELEASE_TABLET | Freq: Two times a day (BID) | ORAL | 3 refills | Status: DC

## 2023-06-05 NOTE — Telephone Encounter (Signed)
See my chart message

## 2023-06-10 ENCOUNTER — Other Ambulatory Visit (HOSPITAL_COMMUNITY)
Admission: RE | Admit: 2023-06-10 | Discharge: 2023-06-10 | Disposition: A | Discharge status: Home / Self Care | Payer: Self-pay | Source: Ambulatory Visit | Attending: Medical Genetics | Admitting: Medical Genetics

## 2023-06-10 NOTE — Progress Notes (Signed)
COVID Vaccine Completed:yes  Date of COVID positive in last 54 days:no  PCP - Laurann Montana, MD Cardiologist - Olga Millers, MD Electrophysiologist- Loman Brooklyn, MD  Chest x-ray - n/a EKG - 09/19/22 Epic Stress Test - 01/17/17 Epic ECHO - 09/02/20 [facility] Cardiac Cath - n/a Pacemaker/ICD device last checked: loop recorder, dead battery Spinal Cord Stimulator:  Bowel Prep - no  Sleep Study - n/a CPAP -   Fasting Blood Sugar - n/a Checks Blood Sugar _____ times a day  Last dose of GLP1 agonist-  N/A GLP1 instructions:  Hold 7 days before surgery    Last dose of SGLT-2 inhibitors-  N/A SGLT-2 instructions:  Hold 3 days before surgery    Blood Thinner Instructions:  Eliquis, hold 3 days Aspirin Instructions: Last Dose: 06/17/23 0900  Activity level: Can go up a flight of stairs and perform activities of daily living without stopping and without symptoms of chest pain or shortness of breath. Difficulty with stairs due to knee.   Anesthesia review: HTN, CAD, a fib, loop recorder,   Patient denies shortness of breath, fever, cough and chest pain at PAT appointment  Patient verbalized understanding of instructions that were given to them at the PAT appointment. Patient was also instructed that they will need to review over the PAT instructions again at home before surgery.

## 2023-06-11 ENCOUNTER — Encounter (HOSPITAL_COMMUNITY)
Admission: RE | Admit: 2023-06-11 | Discharge: 2023-06-11 | Disposition: A | Discharge status: Home / Self Care | Payer: Medicare PPO | Source: Ambulatory Visit | Attending: Orthopaedic Surgery | Admitting: Orthopaedic Surgery

## 2023-06-11 ENCOUNTER — Encounter (HOSPITAL_COMMUNITY): Payer: Self-pay

## 2023-06-11 ENCOUNTER — Other Ambulatory Visit: Payer: Self-pay

## 2023-06-11 VITALS — BP 146/81 | HR 78 | Temp 97.9°F | Ht 64.0 in | Wt 207.0 lb

## 2023-06-11 DIAGNOSIS — Z01818 Encounter for other preprocedural examination: Secondary | ICD-10-CM

## 2023-06-11 DIAGNOSIS — Z01812 Encounter for preprocedural laboratory examination: Secondary | ICD-10-CM | POA: Insufficient documentation

## 2023-06-11 DIAGNOSIS — M1712 Unilateral primary osteoarthritis, left knee: Secondary | ICD-10-CM | POA: Insufficient documentation

## 2023-06-11 DIAGNOSIS — I10 Essential (primary) hypertension: Secondary | ICD-10-CM | POA: Insufficient documentation

## 2023-06-11 LAB — CBC
HCT: 41.6 % (ref 36.0–46.0)
Hemoglobin: 13.4 g/dL (ref 12.0–15.0)
MCH: 30.2 pg (ref 26.0–34.0)
MCHC: 32.2 g/dL (ref 30.0–36.0)
MCV: 93.9 fL (ref 80.0–100.0)
Platelets: 218 10*3/uL (ref 150–400)
RBC: 4.43 MIL/uL (ref 3.87–5.11)
RDW: 13.2 % (ref 11.5–15.5)
WBC: 6.5 10*3/uL (ref 4.0–10.5)
nRBC: 0 % (ref 0.0–0.2)

## 2023-06-11 LAB — BASIC METABOLIC PANEL
Anion gap: 13 (ref 5–15)
BUN: 16 mg/dL (ref 8–23)
CO2: 30 mmol/L (ref 22–32)
Calcium: 8.9 mg/dL (ref 8.9–10.3)
Chloride: 99 mmol/L (ref 98–111)
Creatinine, Ser: 0.46 mg/dL (ref 0.44–1.00)
GFR, Estimated: >60 mL/min (ref >60)
Glucose, Bld: 114 mg/dL — ABNORMAL HIGH (ref 70–99)
Potassium: 3.3 mmol/L — ABNORMAL LOW (ref 3.5–5.1)
Sodium: 142 mmol/L (ref 135–145)

## 2023-06-11 LAB — SURGICAL PCR SCREEN
MRSA, PCR: NEGATIVE
Staphylococcus aureus: NEGATIVE

## 2023-06-11 NOTE — Patient Instructions (Signed)
SURGICAL WAITING ROOM VISITATION  Patients having surgery or a procedure may have no more than 2 support people in the waiting area - these visitors may rotate.    Children under the age of 15 must have an adult with them who is not the patient.  Due to an increase in RSV and influenza rates and associated hospitalizations, children ages 70 and under may not visit patients in [facility] hospitals.  Visitors with respiratory illnesses are discouraged from visiting and should remain at home.  If the patient needs to stay at the hospital during part of their recovery, the visitor guidelines for inpatient rooms apply. Pre-op nurse will coordinate an appropriate time for 1 support person to accompany patient in pre-op.  This support person may not rotate.    Please refer to the [facility] website for the visitor guidelines for Inpatients (after your surgery is over and you are in a regular room).    Your procedure is scheduled on: 06/21/23   Report to [facility] Main Entrance    Report to admitting at 6:00 AM   Call this number if you have problems the morning of surgery [phone]   Do not eat food :After Midnight.   After Midnight you may have the following liquids until 5:30 AM DAY OF SURGERY  Water Non-Citrus Juices (without pulp, NO RED-Apple, White grape, White cranberry) Black Coffee (NO MILK/CREAM OR CREAMERS, sugar ok)  Clear Tea (NO MILK/CREAM OR CREAMERS, sugar ok) regular and decaf                             Plain Jell-O (NO RED)                                           Fruit ices (not with fruit pulp, NO RED)                                     Popsicles (NO RED)                                                               Sports drinks like Gatorade (NO RED)              The day of surgery:  Drink ONE (1) Pre-Surgery Clear Ensure at 5:30 AM the morning of surgery. Drink in one sitting. Do not sip.  This drink was given to you during your hospital  pre-op  appointment visit. Nothing else to drink after completing the  Pre-Surgery Clear Ensure.          If you have questions, please contact your surgeon's office.   FOLLOW BOWEL PREP AND ANY ADDITIONAL PRE OP INSTRUCTIONS YOU RECEIVED FROM YOUR SURGEON'S OFFICE!!!     Oral Hygiene is also important to reduce your risk of infection.                                    Remember - BRUSH YOUR TEETH  THE MORNING OF SURGERY WITH YOUR REGULAR TOOTHPASTE  DENTURES WILL BE REMOVED PRIOR TO SURGERY PLEASE DO NOT APPLY "Poly grip" OR ADHESIVES!!!   Do NOT smoke after Midnight   Stop all vitamins and herbal supplements 7 days before surgery.   Take these medicines the morning of surgery with A SIP OF WATER: Gabapentin   These are anesthesia recommendations for holding your anticoagulants.  Please contact your prescribing physician to confirm IF it is safe to hold your anticoagulants for this length of time.   Eliquis Apixaban   72 hours   Xarelto Rivaroxaban   72 hours  Plavix Clopidogrel   120 hours  Pletal Cilostazol   120 hours                                You may not have any metal on your body including hair pins, jewelry, and body piercing             Do not wear make-up, lotions, powders, perfumes, or deodorant  Do not wear nail polish including gel and S&S, artificial/acrylic nails, or any other type of covering on natural nails including finger and toenails. If you have artificial nails, gel coating, etc. that needs to be removed by a nail salon please have this removed prior to surgery or surgery may need to be canceled/ delayed if the surgeon/ anesthesia feels like they are unable to be safely monitored.   Do not shave  48 hours prior to surgery.    Do not bring valuables to the hospital. [facility] IS NOT             RESPONSIBLE   FOR VALUABLES.   Contacts, glasses, dentures or bridgework may not be worn into surgery.   Bring small overnight bag day of surgery.   DO NOT  BRING YOUR HOME MEDICATIONS TO THE HOSPITAL. PHARMACY WILL DISPENSE MEDICATIONS LISTED ON YOUR MEDICATION LIST TO YOU DURING YOUR ADMISSION IN THE HOSPITAL!              Please read over the following fact sheets you were given: IF YOU HAVE QUESTIONS ABOUT YOUR PRE-OP INSTRUCTIONS PLEASE CALL [name]    If you received a COVID test during your pre-op visit  it is requested that you wear a mask when out in public, stay away from anyone that may not be feeling well and notify your surgeon if you develop symptoms. If you test positive for Covid or have been in contact with anyone that has tested positive in the last 10 days please notify you surgeon.      Pre-operative 5 CHG Bath Instructions   You can play a key role in reducing the risk of infection after surgery. Your skin needs to be as free of germs as possible. You can reduce the number of germs on your skin by washing with CHG (chlorhexidine gluconate) soap before surgery. CHG is an antiseptic soap that kills germs and continues to kill germs even after washing.   DO NOT use if you have an allergy to chlorhexidine/CHG or antibacterial soaps. If your skin becomes reddened or irritated, stop using the CHG and notify one of our RNs at [phone].   Please shower with the CHG soap starting 4 days before surgery using the following schedule:     Please keep in mind the following:  DO NOT shave, including legs and underarms, starting the day of  your first shower.   You may shave your face at any point before/day of surgery.  Place clean sheets on your bed the day you start using CHG soap. Use a clean washcloth (not used since being washed) for each shower. DO NOT sleep with pets once you start using the CHG.   CHG Shower Instructions:  If you choose to wash your hair and private area, wash first with your normal shampoo/soap.  After you use shampoo/soap, rinse your hair and body thoroughly to remove shampoo/soap residue.   Turn the water OFF and apply about 3 tablespoons (45 ml) of CHG soap to a CLEAN washcloth.  Apply CHG soap ONLY FROM YOUR NECK DOWN TO YOUR TOES (washing for 3-5 minutes)  DO NOT use CHG soap on face, private areas, open wounds, or sores.  Pay special attention to the area where your surgery is being performed.  If you are having back surgery, having someone wash your back for you may be helpful. Wait 2 minutes after CHG soap is applied, then you may rinse off the CHG soap.  Pat dry with a clean towel  Put on clean clothes/pajamas   If you choose to wear lotion, please use ONLY the CHG-compatible lotions on the back of this paper.     Additional instructions for the day of surgery: DO NOT APPLY any lotions, deodorants, cologne, or perfumes.   Put on clean/comfortable clothes.  Brush your teeth.  Ask your nurse before applying any prescription medications to the skin.      CHG Compatible Lotions   Aveeno Moisturizing lotion  Cetaphil Moisturizing Cream  Cetaphil Moisturizing Lotion  Clairol Herbal Essence Moisturizing Lotion, Dry Skin  Clairol Herbal Essence Moisturizing Lotion, Extra Dry Skin  Clairol Herbal Essence Moisturizing Lotion, Normal Skin  Curel Age Defying Therapeutic Moisturizing Lotion with Alpha Hydroxy  Curel Extreme Care Body Lotion  Curel Soothing Hands Moisturizing Hand Lotion  Curel Therapeutic Moisturizing Cream, Fragrance-Free  Curel Therapeutic Moisturizing Lotion, Fragrance-Free  Curel Therapeutic Moisturizing Lotion, Original Formula  Eucerin Daily Replenishing Lotion  Eucerin Dry Skin Therapy Plus Alpha Hydroxy Crme  Eucerin Dry Skin Therapy Plus Alpha Hydroxy Lotion  Eucerin Original Crme  Eucerin Original Lotion  Eucerin Plus Crme Eucerin Plus Lotion  Eucerin TriLipid Replenishing Lotion  Keri Anti-Bacterial Hand Lotion  Keri Deep Conditioning Original Lotion Dry Skin Formula Softly Scented  Keri Deep Conditioning Original Lotion, Fragrance  Free Sensitive Skin Formula  Keri Lotion Fast Absorbing Fragrance Free Sensitive Skin Formula  Keri Lotion Fast Absorbing Softly Scented Dry Skin Formula  Keri Original Lotion  Keri Skin Renewal Lotion Keri Silky Smooth Lotion  Keri Silky Smooth Sensitive Skin Lotion  Nivea Body Creamy Conditioning Oil  Nivea Body Extra Enriched Lotion  Nivea Body Original Lotion  Nivea Body Sheer Moisturizing Lotion Nivea Crme  Nivea Skin Firming Lotion  NutraDerm 30 Skin Lotion  NutraDerm Skin Lotion  NutraDerm Therapeutic Skin Cream  NutraDerm Therapeutic Skin Lotion  ProShield Protective Hand Cream  Provon moisturizing lotion View Pre-Surgery Education Videos:  [email or web address]     Incentive Spirometer  An incentive spirometer is a tool that can help keep your lungs clear and active. This tool measures how well you are filling your lungs with each breath. Taking long deep breaths may help reverse or decrease the chance of developing breathing (pulmonary) problems (especially infection) following: A long period of time when you are unable to move or be active. BEFORE THE PROCEDURE  If the spirometer includes an  indicator to show your best effort, your nurse or respiratory therapist will set it to a desired goal. If possible, sit up straight or lean slightly forward. Try not to slouch. Hold the incentive spirometer in an upright position. INSTRUCTIONS FOR USE  Sit on the edge of your bed if possible, or sit up as far as you can in bed or on a chair. Hold the incentive spirometer in an upright position. Breathe out normally. Place the mouthpiece in your mouth and seal your lips tightly around it. Breathe in slowly and as deeply as possible, raising the piston or the ball toward the top of the column. Hold your breath for 3-5 seconds or for as long as possible. Allow the piston or ball to fall to the bottom of the column. Remove the  mouthpiece from your mouth and breathe out normally. Rest for a few seconds and repeat Steps 1 through 7 at least 10 times every 1-2 hours when you are awake. Take your time and take a few normal breaths between deep breaths. The spirometer may include an indicator to show your best effort. Use the indicator as a goal to work toward during each repetition. After each set of 10 deep breaths, practice coughing to be sure your lungs are clear. If you have an incision (the cut made at the time of surgery), support your incision when coughing by placing a pillow or rolled up towels firmly against it. Once you are able to get out of bed, walk around indoors and cough well. You may stop using the incentive spirometer when instructed by your caregiver.  RISKS AND COMPLICATIONS Take your time so you do not get dizzy or light-headed. If you are in pain, you may need to take or ask for pain medication before doing incentive spirometry. It is harder to take a deep breath if you are having pain. AFTER USE Rest and breathe slowly and easily. It can be helpful to keep track of a log of your progress. Your caregiver can provide you with a simple table to help with this. If you are using the spirometer at home, follow these instructions: SEEK MEDICAL CARE IF:  You are having difficultly using the spirometer. You have trouble using the spirometer as often as instructed. Your pain medication is not giving enough relief while using the spirometer. You develop fever of 100.5 F (38.1 C) or higher. SEEK IMMEDIATE MEDICAL CARE IF:  You cough up bloody sputum that had not been present before. You develop fever of 102 F (38.9 C) or greater. You develop worsening pain at or near the incision site. MAKE SURE YOU:  Understand these instructions. Will watch your condition. Will get help right away if you are not doing well or get worse. Document Released: 08/20/2006 Document Revised: 07/02/2011 Document Reviewed:  10/21/2006 [facility] Patient Information 2014 [place], [place].   ________________________________________________________________________

## 2023-06-12 ENCOUNTER — Telehealth: Payer: Self-pay

## 2023-06-12 ENCOUNTER — Encounter: Payer: Self-pay | Admitting: Cardiology

## 2023-06-12 DIAGNOSIS — E876 Hypokalemia: Secondary | ICD-10-CM

## 2023-06-12 DIAGNOSIS — L932 Other local lupus erythematosus: Secondary | ICD-10-CM | POA: Diagnosis not present

## 2023-06-12 DIAGNOSIS — E669 Obesity, unspecified: Secondary | ICD-10-CM | POA: Diagnosis not present

## 2023-06-12 DIAGNOSIS — G0490 Encephalitis and encephalomyelitis, unspecified: Secondary | ICD-10-CM | POA: Diagnosis not present

## 2023-06-12 DIAGNOSIS — M1991 Primary osteoarthritis, unspecified site: Secondary | ICD-10-CM | POA: Diagnosis not present

## 2023-06-12 DIAGNOSIS — Z6836 Body mass index (BMI) 36.0-36.9, adult: Secondary | ICD-10-CM | POA: Diagnosis not present

## 2023-06-12 DIAGNOSIS — Z79899 Other long term (current) drug therapy: Secondary | ICD-10-CM | POA: Diagnosis not present

## 2023-06-12 NOTE — Telephone Encounter (Signed)
Patient had pre-op appointment yesterday.  She refused to sign consent because it didn't say bone on bone osteoarthritis.  Okay to add?

## 2023-06-12 NOTE — Telephone Encounter (Signed)
 Added.

## 2023-06-12 NOTE — Progress Notes (Signed)
Called Sherri from Dr. Eliberto Ivory office to inform that patient did not want to sign consent at PAT unless "bone on bone:" was added to reason for surgery. Per Sherri she will inform Dr. Magnus Ivan.

## 2023-06-13 ENCOUNTER — Encounter: Payer: Self-pay | Admitting: Orthopaedic Surgery

## 2023-06-13 MED ORDER — POTASSIUM CHLORIDE ER 10 MEQ PO TBCR: 20.0000 meq | EXTENDED_RELEASE_TABLET | Freq: Two times a day (BID) | ORAL | 3 refills | Status: DC

## 2023-06-19 LAB — GENECONNECT MOLECULAR SCREEN: Genetic Analysis Overall Interpretation: NEGATIVE

## 2023-06-20 NOTE — Anesthesia Preprocedure Evaluation (Signed)
 Anesthesia Evaluation  Patient identified by MRN, date of birth, ID band Patient awake    Reviewed: Allergy & Precautions, NPO status , Patient's Chart, lab work & pertinent test results  History of Anesthesia Complications (+) PONV and history of anesthetic complications  Airway Mallampati: III  TM Distance: >3 FB Neck ROM: Full    Dental  (+) Teeth Intact, Dental Advisory Given   Pulmonary neg pulmonary ROS   Pulmonary exam normal breath sounds clear to auscultation       Cardiovascular hypertension, Normal cardiovascular exam+ dysrhythmias (eliquis LD:) Atrial Fibrillation  Rhythm:Regular Rate:Normal     Neuro/Psych  PSYCHIATRIC DISORDERS Anxiety        GI/Hepatic negative GI ROS, Neg liver ROS,,,  Endo/Other  BMI 36  Renal/GU negative Renal ROS  negative genitourinary   Musculoskeletal  (+) Arthritis , Osteoarthritis,    Abdominal   Peds  Hematology negative hematology ROS (+) Hb 13.4, plt 218   Anesthesia Other Findings   Reproductive/Obstetrics negative OB ROS                             Anesthesia Physical Anesthesia Plan  ASA: 3  Anesthesia Plan: Spinal, Regional and MAC   Post-op Pain Management: Tylenol PO (pre-op)* and Regional block*   Induction:   PONV Risk Score and Plan: 2 and Propofol infusion and TIVA  Airway Management Planned: Natural Airway and Nasal Cannula  Additional Equipment: None  Intra-op Plan:   Post-operative Plan:   Informed Consent: I have reviewed the patients History and Physical, chart, labs and discussed the procedure including the risks, benefits and alternatives for the proposed anesthesia with the patient or authorized representative who has indicated his/her understanding and acceptance.       Plan Discussed with: CRNA  Anesthesia Plan Comments:        Anesthesia Quick Evaluation

## 2023-06-20 NOTE — H&P (Signed)
 TOTAL KNEE ADMISSION H&P  Patient is being admitted for left total knee arthroplasty.  Subjective:  Chief Complaint:left knee pain.  HPI: Lindsay Clark, 83 y.o. female, has a history of pain and functional disability in the left knee due to arthritis and has failed non-surgical conservative treatments for greater than 12 weeks to includeNSAID's and/or analgesics, corticosteriod injections, viscosupplementation injections, flexibility and strengthening excercises, use of assistive devices, and activity modification.  Onset of symptoms was gradual, starting 5 years ago with gradually worsening course since that time. The patient noted prior procedures on the knee to include  arthroscopy on the left knee(s).  Patient currently rates pain in the left knee(s) at 10 out of 10 with activity. Patient has night pain, worsening of pain with activity and weight bearing, pain that interferes with activities of daily living, pain with passive range of motion, crepitus, and joint swelling.  Patient has evidence of subchondral sclerosis, periarticular osteophytes, and joint space narrowing by imaging studies. There is no active infection.  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   Acquired genu valgum 10/05/2021   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   Left knee pain 08/09/2020   Persistent atrial fibrillation (HCC) 04/05/2020   Secondary hypercoagulable state (HCC) 04/05/2020   Primary osteoarthritis of left knee 09/02/2019   Prolapsed internal hemorrhoids, grade 2 03/03/2019   Brain lesion 06/24/2017   Osteoarthritis    Multiple lesions on CT of brain and spine    Movement disorder    Lumbar spondylosis    Hyperlipidemia    DDD (degenerative disc disease), lumbar     Dermatitis    Cervical spondylosis    Cervical disc disease    Diastolic dysfunction 07/11/2016   Syncope 04/18/2016   Anxiety 04/18/2016   Hypertension    Autoimmune disorder-autoimmune encephalitis    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   Coronary artery calcification of native artery 11/04/2014   Abnormal finding on MRI of brain 10/04/2014   Pruritus 11/30/2011   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   Past Medical History:  Diagnosis Date   Atherosclerosis    mild cerebral   Atrial fibrillation and flutter (HCC)    Autoimmune disorder (HCC)    Autoimmune encephalitis    Cervical disc disease    with spondylosis   Cervical spondylosis    Chronic dermatitis    eczematous   Colitis    COVID 10/2020   DDD (degenerative disc disease), lumbar    Frozen shoulder    left   Hyperlipidemia    Hypertension    IBS (irritable bowel syndrome)    with diarrhea Pt stated she never had this   Lateral epicondylitis    Lumbar spondylosis    Lupus    cutaneous   Movement disorder    Multiple lesions on CT of brain and spine    reports lesions from previous CT scan at [facility]    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   PONV (postoperative nausea and vomiting)    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   Torn meniscus    left    Past Surgical History:  Procedure Laterality Date   A FLUTTER ABLATION  08/2020   [facility] and had cardioversion at the same time   CATARACT EXTRACTION     COLONOSCOPY  EYE SURGERY Left    HEMORRHOID BANDING     KNEE ARTHROSCOPY Left 08/09/2020   Procedure: LEFT KNEE ARTHROSCOPY MEDIAL AND LATERAL MENISECTONMY AND CONDROPLASTY;  Surgeon: Marcene Corning, MD;  Location: [facility] ORS;  Service: Orthopedics;  Laterality: Left;   LOOP RECORDER INSERTION N/A 07/05/2017   Procedure: LOOP RECORDER INSERTION;  Surgeon: Hillis Range, MD;  Location: [facility] INVASIVE CV LAB;  Service: Cardiovascular;  Laterality: N/A;   TOTAL KNEE ARTHROPLASTY Right 04/24/2022   Procedure: RIGHT  TOTAL KNEE ARTHROPLASTY;  Surgeon: Kathryne Hitch, MD;  Location: [facility] OR;  Service: Orthopedics;  Laterality: Right;   TUBAL LIGATION  1980   WISDOM TOOTH EXTRACTION      No current facility-administered medications for this encounter.   Current Outpatient Medications  Medication Sig Dispense Refill Last Dose/Taking   Cholecalciferol (VITAMIN D) 50 MCG (2000 UT) CAPS Take 2,000-4,000 Units by mouth See admin instructions. Take 4000 units in the morning and 2000 units in the evening   Taking   clobetasol cream (TEMOVATE) 0.05 % Apply 1 Application topically daily as needed (itching).   Taking As Needed   ELIQUIS 5 MG TABS tablet TAKE ONE TABLET BY MOUTH TWICE DAILY 60 tablet 5 Taking   furosemide (LASIX) 40 MG tablet Take 2 tablets (80 mg total) by mouth daily. 180 tablet 3 Taking   gabapentin (NEURONTIN) 300 MG capsule Take 300 mg by mouth daily as needed (itching / pain).   Taking As Needed   hydroxychloroquine (PLAQUENIL) 200 MG tablet Take 200 mg by mouth 2 (two) times daily.   Taking   lipase/protease/amylase (CREON) 36000 UNITS CPEP capsule Take 2 capsules with each meal. Take 1 capsule with each snack (up to 2 snacks) daily. 300 capsule 2 Taking   Multiple Vitamin (MULTIVITAMIN WITH MINERALS) TABS tablet Take 1 tablet by mouth in the morning. One-A-Day for Women 50+   Taking   Propylene Glycol (SYSTANE BALANCE) 0.6 % SOLN Place 1 drop into both eyes at bedtime.   Taking   potassium chloride (KLOR-CON) 10 MEQ tablet Take 2 tablets (20 mEq total) by mouth 2 (two) times daily. 360 tablet 3    Allergies  Allergen Reactions   Amlodipine Swelling     leg swelling    Diltiazem Swelling    Bilateral feet   Methotrexate Swelling     swelling in ankles    Metronidazole Swelling     leg swelling   Other Other (See Comments)    Medication:Methychloroisothlazolinone/methylisothiazilinoneCL +Me-Isothiazolinone Reaction: unknown-patch testing  Medication:glyceryl thioglycolate   Reaction:unknown-patch testing   Quaternium-15 Other (See Comments)    unknown reaction-patching testing    Social History   Tobacco Use   Smoking status: Never   Smokeless tobacco: Never  Substance Use Topics   Alcohol use: Yes    Alcohol/week: 4.0 standard drinks of alcohol    Types: 4 Glasses of wine per week    Comment: 7 glasses of wine weekly    Family History  Problem Relation Age of Onset   Cancer Mother 52       colon cancer   Stroke Mother    Colon cancer Mother 56   Heart attack Father 86   Emphysema Father    Cancer Maternal Grandmother 74       uterine   Thyroid disease Maternal Grandmother    Thrombosis Maternal Grandfather    Thyroid disease Son      Review of Systems  Objective:  Physical Exam Vitals reviewed.  Constitutional:  Appearance: Normal appearance. She is obese.  HENT:     Head: Normocephalic and atraumatic.  Eyes:     Extraocular Movements: Extraocular movements intact.     Pupils: Pupils are equal, round, and reactive to light.  Cardiovascular:     Rate and Rhythm: Normal rate.  Pulmonary:     Effort: Pulmonary effort is normal.     Breath sounds: Normal breath sounds.  Abdominal:     Palpations: Abdomen is soft.  Musculoskeletal:     Cervical back: Normal range of motion and neck supple.     Left knee: Effusion, bony tenderness and crepitus present. Decreased range of motion. Tenderness present over the medial joint line and lateral joint line. Abnormal alignment and abnormal meniscus.  Neurological:     Mental Status: She is alert and oriented to person, place, and time.  Psychiatric:        Behavior: Behavior normal.     Vital signs in last 24 hours:    Labs:   Estimated body mass index is 35.53 kg/m as calculated from the following:   Height as of 06/11/23: 5\' 4"  (1.626 m).   Weight as of 06/11/23: 93.9 kg.   Imaging Review Plain radiographs demonstrate severe degenerative joint disease of the left knee(s).  The overall alignment ismild valgus. The bone quality appears to be good for age and reported activity level.      Assessment/Plan:  End stage arthritis, left knee   The patient history, physical examination, clinical judgment of the provider and imaging studies are consistent with end stage degenerative joint disease of the left knee(s) and total knee arthroplasty is deemed medically necessary. The treatment options including medical management, injection therapy arthroscopy and arthroplasty were discussed at length. The risks and benefits of total knee arthroplasty were presented and reviewed. The risks due to aseptic loosening, infection, stiffness, patella tracking problems, thromboembolic complications and other imponderables were discussed. The patient acknowledged the explanation, agreed to proceed with the plan and consent was signed. Patient is being admitted for inpatient treatment for surgery, pain control, PT, OT, prophylactic antibiotics, VTE prophylaxis, progressive ambulation and ADL's and discharge planning. The patient is planning to be discharged home with home health services

## 2023-06-21 ENCOUNTER — Ambulatory Visit (HOSPITAL_COMMUNITY): Payer: Medicare PPO | Admitting: Physician Assistant

## 2023-06-21 ENCOUNTER — Ambulatory Visit (HOSPITAL_COMMUNITY): Payer: Self-pay | Admitting: Anesthesiology

## 2023-06-21 ENCOUNTER — Other Ambulatory Visit: Payer: Self-pay

## 2023-06-21 ENCOUNTER — Inpatient Hospital Stay (HOSPITAL_COMMUNITY)
Admission: RE | Admit: 2023-06-21 | Discharge: 2023-06-24 | DRG: 470 | Disposition: A | Discharge status: Skilled Nursing Facility | Payer: Medicare PPO | Attending: Orthopaedic Surgery | Admitting: Orthopaedic Surgery

## 2023-06-21 ENCOUNTER — Encounter (HOSPITAL_COMMUNITY): Payer: Self-pay | Admitting: Orthopaedic Surgery

## 2023-06-21 ENCOUNTER — Encounter (HOSPITAL_COMMUNITY)
Admission: RE | Disposition: A | Discharge status: Skilled Nursing Facility | Payer: Self-pay | Source: Home / Self Care | Attending: Orthopaedic Surgery

## 2023-06-21 ENCOUNTER — Observation Stay (HOSPITAL_COMMUNITY): Payer: Medicare PPO

## 2023-06-21 DIAGNOSIS — I4819 Other persistent atrial fibrillation: Secondary | ICD-10-CM | POA: Diagnosis present

## 2023-06-21 DIAGNOSIS — Z8616 Personal history of COVID-19: Secondary | ICD-10-CM | POA: Diagnosis not present

## 2023-06-21 DIAGNOSIS — Z8661 Personal history of infections of the central nervous system: Secondary | ICD-10-CM | POA: Diagnosis not present

## 2023-06-21 DIAGNOSIS — I4892 Unspecified atrial flutter: Secondary | ICD-10-CM | POA: Diagnosis present

## 2023-06-21 DIAGNOSIS — Z7901 Long term (current) use of anticoagulants: Secondary | ICD-10-CM | POA: Diagnosis not present

## 2023-06-21 DIAGNOSIS — Z8249 Family history of ischemic heart disease and other diseases of the circulatory system: Secondary | ICD-10-CM

## 2023-06-21 DIAGNOSIS — E785 Hyperlipidemia, unspecified: Secondary | ICD-10-CM | POA: Diagnosis present

## 2023-06-21 DIAGNOSIS — I2510 Atherosclerotic heart disease of native coronary artery without angina pectoris: Secondary | ICD-10-CM | POA: Diagnosis present

## 2023-06-21 DIAGNOSIS — I10 Essential (primary) hypertension: Secondary | ICD-10-CM

## 2023-06-21 DIAGNOSIS — M1712 Unilateral primary osteoarthritis, left knee: Secondary | ICD-10-CM

## 2023-06-21 DIAGNOSIS — Z825 Family history of asthma and other chronic lower respiratory diseases: Secondary | ICD-10-CM

## 2023-06-21 DIAGNOSIS — Z800 Family history of malignant neoplasm of digestive organs: Secondary | ICD-10-CM | POA: Diagnosis not present

## 2023-06-21 DIAGNOSIS — I4891 Unspecified atrial fibrillation: Secondary | ICD-10-CM | POA: Diagnosis not present

## 2023-06-21 DIAGNOSIS — R0902 Hypoxemia: Secondary | ICD-10-CM | POA: Diagnosis not present

## 2023-06-21 DIAGNOSIS — Z471 Aftercare following joint replacement surgery: Secondary | ICD-10-CM | POA: Diagnosis not present

## 2023-06-21 DIAGNOSIS — M47816 Spondylosis without myelopathy or radiculopathy, lumbar region: Secondary | ICD-10-CM | POA: Diagnosis present

## 2023-06-21 DIAGNOSIS — Z96652 Presence of left artificial knee joint: Secondary | ICD-10-CM | POA: Diagnosis not present

## 2023-06-21 DIAGNOSIS — Z96651 Presence of right artificial knee joint: Secondary | ICD-10-CM | POA: Diagnosis present

## 2023-06-21 DIAGNOSIS — Z823 Family history of stroke: Secondary | ICD-10-CM | POA: Diagnosis not present

## 2023-06-21 DIAGNOSIS — Z888 Allergy status to other drugs, medicaments and biological substances status: Secondary | ICD-10-CM | POA: Diagnosis not present

## 2023-06-21 DIAGNOSIS — R609 Edema, unspecified: Secondary | ICD-10-CM | POA: Diagnosis not present

## 2023-06-21 DIAGNOSIS — Z79899 Other long term (current) drug therapy: Secondary | ICD-10-CM | POA: Diagnosis not present

## 2023-06-21 DIAGNOSIS — Z7401 Bed confinement status: Secondary | ICD-10-CM | POA: Diagnosis not present

## 2023-06-21 DIAGNOSIS — G8918 Other acute postprocedural pain: Secondary | ICD-10-CM | POA: Diagnosis not present

## 2023-06-21 HISTORY — PX: TOTAL KNEE ARTHROPLASTY: SHX125

## 2023-06-21 SURGERY — ARTHROPLASTY, KNEE, TOTAL
Anesthesia: Monitor Anesthesia Care | Site: Knee | Laterality: Left

## 2023-06-21 MED ORDER — METHOCARBAMOL 1000 MG/10ML IJ SOLN: 500.0000 mg | Freq: Four times a day (QID) | INTRAMUSCULAR | Status: DC | PRN

## 2023-06-21 MED ORDER — BUPIVACAINE-EPINEPHRINE 0.25% -1:200000 IJ SOLN
INTRAMUSCULAR | Status: DC | PRN
  Administered 2023-06-21: 30 mL

## 2023-06-21 MED ORDER — HYDROMORPHONE HCL 1 MG/ML IJ SOLN
0.5000 mg | INTRAMUSCULAR | Status: DC | PRN
  Filled 2023-06-21: qty 1

## 2023-06-21 MED ORDER — METOCLOPRAMIDE HCL 5 MG/ML IJ SOLN: 5.0000 mg | Freq: Three times a day (TID) | INTRAMUSCULAR | Status: DC | PRN

## 2023-06-21 MED ORDER — CHLORHEXIDINE GLUCONATE 0.12 % MT SOLN
15.0000 mL | Freq: Once | OROMUCOSAL | Status: AC
  Administered 2023-06-21: 15 mL via OROMUCOSAL

## 2023-06-21 MED ORDER — LACTATED RINGERS IV SOLN: INTRAVENOUS | Status: DC

## 2023-06-21 MED ORDER — ONDANSETRON HCL 4 MG/2ML IJ SOLN
INTRAMUSCULAR | Status: AC
  Filled 2023-06-21: qty 2

## 2023-06-21 MED ORDER — DEXAMETHASONE SODIUM PHOSPHATE 10 MG/ML IJ SOLN
INTRAMUSCULAR | Status: DC | PRN
  Administered 2023-06-21: 10 mg

## 2023-06-21 MED ORDER — PROPOFOL 10 MG/ML IV BOLUS
INTRAVENOUS | Status: DC | PRN
  Administered 2023-06-21 (×2): 20 mg via INTRAVENOUS

## 2023-06-21 MED ORDER — PROPOFOL 1000 MG/100ML IV EMUL
INTRAVENOUS | Status: AC
  Filled 2023-06-21: qty 100

## 2023-06-21 MED ORDER — SODIUM CHLORIDE 0.9 % IR SOLN
Status: DC | PRN
  Administered 2023-06-21: 1000 mL

## 2023-06-21 MED ORDER — 0.9 % SODIUM CHLORIDE (POUR BTL) OPTIME
TOPICAL | Status: DC | PRN
  Administered 2023-06-21: 1000 mL

## 2023-06-21 MED ORDER — ONDANSETRON HCL 4 MG/2ML IJ SOLN: 4.0000 mg | Freq: Once | INTRAMUSCULAR | Status: DC | PRN

## 2023-06-21 MED ORDER — ACETAMINOPHEN 325 MG PO TABS
325.0000 mg | ORAL_TABLET | Freq: Four times a day (QID) | ORAL | Status: DC | PRN
  Administered 2023-06-22 (×2): 650 mg via ORAL
  Filled 2023-06-21 (×2): qty 2

## 2023-06-21 MED ORDER — POTASSIUM CHLORIDE CRYS ER 20 MEQ PO TBCR
20.0000 meq | EXTENDED_RELEASE_TABLET | Freq: Two times a day (BID) | ORAL | Status: DC
  Administered 2023-06-21 – 2023-06-24 (×7): 20 meq via ORAL
  Filled 2023-06-21 (×7): qty 1

## 2023-06-21 MED ORDER — LIDOCAINE HCL (PF) 2 % IJ SOLN
INTRAMUSCULAR | Status: AC
  Filled 2023-06-21: qty 5

## 2023-06-21 MED ORDER — ORAL CARE MOUTH RINSE: 15.0000 mL | Freq: Once | OROMUCOSAL | Status: AC

## 2023-06-21 MED ORDER — OXYCODONE HCL 5 MG PO TABS
10.0000 mg | ORAL_TABLET | ORAL | Status: DC | PRN
  Administered 2023-06-21 – 2023-06-24 (×9): 10 mg via ORAL
  Filled 2023-06-21 (×8): qty 2

## 2023-06-21 MED ORDER — FUROSEMIDE 40 MG PO TABS
80.0000 mg | ORAL_TABLET | Freq: Every day | ORAL | Status: DC
  Administered 2023-06-21 – 2023-06-24 (×4): 80 mg via ORAL
  Filled 2023-06-21 (×4): qty 2

## 2023-06-21 MED ORDER — OXYCODONE HCL 5 MG PO TABS
5.0000 mg | ORAL_TABLET | ORAL | Status: DC | PRN
  Administered 2023-06-21 – 2023-06-23 (×2): 10 mg via ORAL
  Filled 2023-06-21 (×3): qty 2

## 2023-06-21 MED ORDER — PANTOPRAZOLE SODIUM 40 MG PO TBEC
40.0000 mg | DELAYED_RELEASE_TABLET | Freq: Every day | ORAL | Status: DC
  Administered 2023-06-21 – 2023-06-24 (×4): 40 mg via ORAL
  Filled 2023-06-21 (×4): qty 1

## 2023-06-21 MED ORDER — CEFAZOLIN SODIUM-DEXTROSE 2-4 GM/100ML-% IV SOLN
2.0000 g | INTRAVENOUS | Status: AC
  Administered 2023-06-21: 2 g via INTRAVENOUS
  Filled 2023-06-21: qty 100

## 2023-06-21 MED ORDER — STERILE WATER FOR IRRIGATION IR SOLN
Status: DC | PRN
  Administered 2023-06-21: 1000 mL

## 2023-06-21 MED ORDER — BUPIVACAINE-EPINEPHRINE (PF) 0.25% -1:200000 IJ SOLN
INTRAMUSCULAR | Status: AC
  Filled 2023-06-21: qty 30

## 2023-06-21 MED ORDER — OXYCODONE HCL 5 MG PO TABS: 5.0000 mg | ORAL_TABLET | Freq: Once | ORAL | Status: DC | PRN

## 2023-06-21 MED ORDER — DEXAMETHASONE SODIUM PHOSPHATE 10 MG/ML IJ SOLN
INTRAMUSCULAR | Status: DC | PRN
  Administered 2023-06-21: 10 mg via INTRAVENOUS

## 2023-06-21 MED ORDER — ACETAMINOPHEN 500 MG PO TABS
1000.0000 mg | ORAL_TABLET | Freq: Once | ORAL | Status: AC
  Administered 2023-06-21: 1000 mg via ORAL
  Filled 2023-06-21: qty 2

## 2023-06-21 MED ORDER — PHENOL 1.4 % MT LIQD: 1.0000 | OROMUCOSAL | Status: DC | PRN

## 2023-06-21 MED ORDER — DOCUSATE SODIUM 100 MG PO CAPS
100.0000 mg | ORAL_CAPSULE | Freq: Two times a day (BID) | ORAL | Status: DC
  Administered 2023-06-21 – 2023-06-24 (×7): 100 mg via ORAL
  Filled 2023-06-21 (×7): qty 1

## 2023-06-21 MED ORDER — ALUM & MAG HYDROXIDE-SIMETH 200-200-20 MG/5ML PO SUSP: 30.0000 mL | ORAL | Status: DC | PRN

## 2023-06-21 MED ORDER — HYDROXYCHLOROQUINE SULFATE 200 MG PO TABS
200.0000 mg | ORAL_TABLET | Freq: Two times a day (BID) | ORAL | Status: DC
  Administered 2023-06-21 – 2023-06-24 (×6): 200 mg via ORAL
  Filled 2023-06-21 (×6): qty 1

## 2023-06-21 MED ORDER — METOCLOPRAMIDE HCL 5 MG PO TABS
5.0000 mg | ORAL_TABLET | Freq: Three times a day (TID) | ORAL | Status: DC | PRN
  Administered 2023-06-22: 5 mg via ORAL
  Filled 2023-06-21: qty 1

## 2023-06-21 MED ORDER — SODIUM CHLORIDE 0.9 % IV SOLN: INTRAVENOUS | Status: AC

## 2023-06-21 MED ORDER — ONDANSETRON HCL 4 MG/2ML IJ SOLN: 4.0000 mg | Freq: Four times a day (QID) | INTRAMUSCULAR | Status: DC | PRN

## 2023-06-21 MED ORDER — HYDROMORPHONE HCL 1 MG/ML IJ SOLN
0.2500 mg | INTRAMUSCULAR | Status: DC | PRN
Start: 2023-06-21 — End: 2023-06-21

## 2023-06-21 MED ORDER — TRANEXAMIC ACID-NACL 1000-0.7 MG/100ML-% IV SOLN
1000.0000 mg | INTRAVENOUS | Status: AC
  Administered 2023-06-21: 1000 mg via INTRAVENOUS
  Filled 2023-06-21: qty 100

## 2023-06-21 MED ORDER — ONDANSETRON HCL 4 MG/2ML IJ SOLN
INTRAMUSCULAR | Status: DC | PRN
  Administered 2023-06-21: 4 mg via INTRAVENOUS

## 2023-06-21 MED ORDER — CEFAZOLIN SODIUM-DEXTROSE 2-4 GM/100ML-% IV SOLN
2.0000 g | Freq: Four times a day (QID) | INTRAVENOUS | Status: AC
  Administered 2023-06-21 (×2): 2 g via INTRAVENOUS
  Filled 2023-06-21 (×2): qty 100

## 2023-06-21 MED ORDER — FENTANYL CITRATE PF 50 MCG/ML IJ SOSY
50.0000 ug | PREFILLED_SYRINGE | Freq: Once | INTRAMUSCULAR | Status: AC
  Administered 2023-06-21: 100 ug via INTRAVENOUS
  Filled 2023-06-21: qty 2

## 2023-06-21 MED ORDER — PROPOFOL 500 MG/50ML IV EMUL
INTRAVENOUS | Status: DC | PRN
  Administered 2023-06-21: 50 ug/kg/min via INTRAVENOUS

## 2023-06-21 MED ORDER — BUPIVACAINE IN DEXTROSE 0.75-8.25 % IT SOLN
INTRATHECAL | Status: DC | PRN
  Administered 2023-06-21: 1.6 mL via INTRATHECAL

## 2023-06-21 MED ORDER — METHOCARBAMOL 500 MG PO TABS
500.0000 mg | ORAL_TABLET | Freq: Four times a day (QID) | ORAL | Status: DC | PRN
  Administered 2023-06-21 – 2023-06-23 (×4): 500 mg via ORAL
  Filled 2023-06-21 (×4): qty 1

## 2023-06-21 MED ORDER — OXYCODONE HCL 5 MG/5ML PO SOLN: 5.0000 mg | Freq: Once | ORAL | Status: DC | PRN

## 2023-06-21 MED ORDER — MENTHOL 3 MG MT LOZG: 1.0000 | LOZENGE | OROMUCOSAL | Status: DC | PRN

## 2023-06-21 MED ORDER — ONDANSETRON HCL 4 MG PO TABS
4.0000 mg | ORAL_TABLET | Freq: Four times a day (QID) | ORAL | Status: DC | PRN
  Administered 2023-06-22: 4 mg via ORAL
  Filled 2023-06-21: qty 1

## 2023-06-21 MED ORDER — ROPIVACAINE HCL 5 MG/ML IJ SOLN
INTRAMUSCULAR | Status: DC | PRN
  Administered 2023-06-21: 30 mL via PERINEURAL

## 2023-06-21 MED ORDER — APIXABAN 5 MG PO TABS
5.0000 mg | ORAL_TABLET | Freq: Two times a day (BID) | ORAL | Status: DC
  Administered 2023-06-22 – 2023-06-24 (×5): 5 mg via ORAL
  Filled 2023-06-21 (×5): qty 1

## 2023-06-21 MED ORDER — AMISULPRIDE (ANTIEMETIC) 5 MG/2ML IV SOLN: 10.0000 mg | Freq: Once | INTRAVENOUS | Status: DC | PRN

## 2023-06-21 MED ORDER — DEXAMETHASONE SODIUM PHOSPHATE 10 MG/ML IJ SOLN
INTRAMUSCULAR | Status: AC
  Filled 2023-06-21: qty 1

## 2023-06-21 MED ORDER — PROPOFOL 10 MG/ML IV BOLUS
INTRAVENOUS | Status: AC
  Filled 2023-06-21: qty 20

## 2023-06-21 MED ORDER — DIPHENHYDRAMINE HCL 12.5 MG/5ML PO ELIX: 12.5000 mg | ORAL_SOLUTION | ORAL | Status: DC | PRN

## 2023-06-21 SURGICAL SUPPLY — 54 items
BAG COUNTER SPONGE SURGICOUNT (BAG) IMPLANT
BAG ZIPLOCK 12X15 (MISCELLANEOUS) ×1 IMPLANT
BENZOIN TINCTURE PRP APPL 2/3 (GAUZE/BANDAGES/DRESSINGS) IMPLANT
BLADE SAG 18X100X1.27 (BLADE) ×1 IMPLANT
BLADE SURG SZ10 CARB STEEL (BLADE) IMPLANT
BNDG ELASTIC 6INX 5YD STR LF (GAUZE/BANDAGES/DRESSINGS) ×2 IMPLANT
BOWL SMART MIX CTS (DISPOSABLE) IMPLANT
CEMENT BONE R 1X40 (Cement) IMPLANT
COOLER ICEMAN CLASSIC (MISCELLANEOUS) ×1 IMPLANT
COVER SURGICAL LIGHT HANDLE (MISCELLANEOUS) ×1 IMPLANT
CUFF TRNQT CYL 34X4.125X (TOURNIQUET CUFF) ×1 IMPLANT
DRAPE INCISE IOBAN 66X45 STRL (DRAPES) ×1 IMPLANT
DRAPE U-SHAPE 47X51 STRL (DRAPES) ×1 IMPLANT
DURAPREP 26ML APPLICATOR (WOUND CARE) ×1 IMPLANT
ELECT BLADE TIP CTD 4 INCH (ELECTRODE) ×1 IMPLANT
ELECT REM PT RETURN 15FT ADLT (MISCELLANEOUS) ×1 IMPLANT
FEMORAL KNEE COMP SZ 8 STND LT (Knees) ×1 IMPLANT
FEMORAL KNEE COMP SZ 8STD LT (Knees) IMPLANT
GAUZE PAD ABD 8X10 STRL (GAUZE/BANDAGES/DRESSINGS) ×2 IMPLANT
GAUZE SPONGE 4X4 12PLY STRL (GAUZE/BANDAGES/DRESSINGS) ×1 IMPLANT
GAUZE XEROFORM 1X8 LF (GAUZE/BANDAGES/DRESSINGS) IMPLANT
GLOVE BIO SURGEON STRL SZ7.5 (GLOVE) ×1 IMPLANT
GLOVE BIOGEL PI IND STRL 8 (GLOVE) ×2 IMPLANT
GLOVE ECLIPSE 8.0 STRL XLNG CF (GLOVE) ×1 IMPLANT
GOWN STRL REUS W/ TWL XL LVL3 (GOWN DISPOSABLE) ×2 IMPLANT
HOLDER FOLEY CATH W/STRAP (MISCELLANEOUS) IMPLANT
IMMOBILIZER KNEE 20 (SOFTGOODS) ×1 IMPLANT
IMMOBILIZER KNEE 20 THIGH 36 (SOFTGOODS) ×1 IMPLANT
KIT TURNOVER KIT A (KITS) IMPLANT
MANIFOLD NEPTUNE II (INSTRUMENTS) ×1 IMPLANT
NS IRRIG 1000ML POUR BTL (IV SOLUTION) ×1 IMPLANT
PACK TOTAL KNEE CUSTOM (KITS) ×1 IMPLANT
PAD COLD SHLDR WRAP-ON (PAD) ×1 IMPLANT
PADDING CAST COTTON 6X4 STRL (CAST SUPPLIES) ×2 IMPLANT
PIN DRILL HDLS TROCAR 75 4PK (PIN) IMPLANT
PROTECTOR NERVE ULNAR (MISCELLANEOUS) ×1 IMPLANT
SCREW FEMALE HEX FIX 25X2.5 (ORTHOPEDIC DISPOSABLE SUPPLIES) IMPLANT
SET HNDPC FAN SPRY TIP SCT (DISPOSABLE) ×1 IMPLANT
SET PAD KNEE POSITIONER (MISCELLANEOUS) ×1 IMPLANT
SPIKE FLUID TRANSFER (MISCELLANEOUS) IMPLANT
STAPLER SKIN PROX WIDE 3.9 (STAPLE) IMPLANT
STEM ARTISURF EF 12 SZ8-11 (Stem) IMPLANT
STEM POLY PAT PLY 32M KNEE (Knees) IMPLANT
STEM TIBIA 5 DEG SZ E L KNEE (Knees) IMPLANT
STRIP CLOSURE SKIN 1/2X4 (GAUZE/BANDAGES/DRESSINGS) IMPLANT
SUT MNCRL AB 4-0 PS2 18 (SUTURE) IMPLANT
SUT VIC AB 0 CT1 27XBRD ANTBC (SUTURE) ×1 IMPLANT
SUT VIC AB 1 CT1 36 (SUTURE) ×2 IMPLANT
SUT VIC AB 2-0 CT1 TAPERPNT 27 (SUTURE) ×2 IMPLANT
TIBIA STEM 5 DEG SZ E L KNEE (Knees) ×1 IMPLANT
TOWEL GREEN STERILE FF (TOWEL DISPOSABLE) ×1 IMPLANT
TRAY FOL W/BAG SLVR 16FR STRL (SET/KITS/TRAYS/PACK) IMPLANT
WATER STERILE IRR 1000ML POUR (IV SOLUTION) ×2 IMPLANT
YANKAUER SUCT BULB TIP NO VENT (SUCTIONS) ×1 IMPLANT

## 2023-06-21 NOTE — Anesthesia Postprocedure Evaluation (Signed)
 Anesthesia Post Note  Patient: Kathyrn Sheriff Mcconaughey  Procedure(s) Performed: LEFT TOTAL KNEE ARTHROPLASTY (Left: Knee)     Patient location during evaluation: PACU Anesthesia Type: Regional, MAC and Spinal Level of consciousness: awake and alert and oriented Pain management: pain level controlled Vital Signs Assessment: post-procedure vital signs reviewed and stable Respiratory status: spontaneous breathing, nonlabored ventilation and respiratory function stable Cardiovascular status: blood pressure returned to baseline and stable Postop Assessment: no headache, no backache, spinal receding and patient able to bend at knees Anesthetic complications: no   No notable events documented.  Last Vitals:  Vitals:   06/21/23 1045 06/21/23 1100  BP: 129/69 (!) 128/49  Pulse: 69 68  Resp: 11 14  Temp:    SpO2: 97% 95%    Last Pain:  Vitals:   06/21/23 1100  TempSrc:   PainSc: 0-No pain                 Lannie Fields

## 2023-06-21 NOTE — Anesthesia Procedure Notes (Signed)
 Anesthesia Regional Block: Adductor canal block   Pre-Anesthetic Checklist: , timeout performed,  Correct Patient, Correct Site, Correct Laterality,  Correct Procedure, Correct Position, site marked,  Risks and benefits discussed,  Surgical consent,  Pre-op evaluation,  At surgeon's request and post-op pain management  Laterality: Left  Prep: Maximum Sterile Barrier Precautions used, chloraprep       Needles:  Injection technique: Single-shot  Needle Type: Echogenic Stimulator Needle     Needle Length: 9cm  Needle Gauge: 22     Additional Needles:   Procedures:,,,, ultrasound used (permanent image in chart),,    Narrative:  Start time: 06/21/2023 7:35 AM End time: 06/21/2023 7:40 AM Injection made incrementally with aspirations every 5 mL.  Performed by: Personally  Anesthesiologist: Lannie Fields, DO  Additional Notes: Monitors applied. No increased pain on injection. No increased resistance to injection. Injection made in 5cc increments. Good needle visualization. Patient tolerated procedure well.

## 2023-06-21 NOTE — Evaluation (Signed)
 Physical Therapy Evaluation Patient Details Name: Lindsay Clark MRN: [id number] DOB: [birth year] Today's Date: 06/21/2023  History of Present Illness  Pt s/p L TKR and with hs of R TKR, Lupus, DDD and a-fib  Clinical Impression  Pt s/p L TKR and presents with decreased L LE strength/ROM and post op pain limiting functional mobility. Pt plans dc to [facility] prior to return home.        If plan is discharge home, recommend the following: A little help with walking and/or transfers;A little help with bathing/dressing/bathroom;Assistance with cooking/housework;Assist for transportation;Help with stairs or ramp for entrance   Can travel by private vehicle        Equipment Recommendations None recommended by PT  Recommendations for Other Services       Functional Status Assessment Patient has had a recent decline in their functional status and demonstrates the ability to make significant improvements in function in a reasonable and predictable amount of time.     Precautions / Restrictions Precautions Precautions: Knee;Fall Required Braces or Orthoses: Knee Immobilizer - Left Knee Immobilizer - Left: Discontinue once straight leg raise with < 10 degree lag Restrictions Weight Bearing Restrictions Per Provider Order: No Other Position/Activity Restrictions: WBAT      Mobility  Bed Mobility Overal bed mobility: Needs Assistance Bed Mobility: Supine to Sit     Supine to sit: Min assist, +2 for physical assistance, +2 for safety/equipment     General bed mobility comments: use of bedrail, increased time, cues for sequence    Transfers Overall transfer level: Needs assistance Equipment used: Rolling walker (2 wheels) Transfers: Sit to/from Stand Sit to Stand: Min assist, Mod assist, +2 physical assistance, +2 safety/equipment, From elevated surface           General transfer comment: cues for LE management, use of UEs to self assist; pt attempting to stumble bkwd  requiring assist to prevent falling    Ambulation/Gait Ambulation/Gait assistance: Min assist, +2 physical assistance, +2 safety/equipment Gait Distance (Feet): 38 Feet Assistive device: Rolling walker (2 wheels) Gait Pattern/deviations: Step-to pattern, Decreased step length - right, Decreased step length - left, Shuffle, Trunk flexed Gait velocity: decr     General Gait Details: cues for posture, sequence and position from [employer]            Wheelchair Mobility     Tilt Bed    Modified Rankin (Stroke Patients Only)       Balance Overall balance assessment: Needs assistance Sitting-balance support: No upper extremity supported, Feet supported Sitting balance-Leahy Scale: Good     Standing balance support: Bilateral upper extremity supported Standing balance-Leahy Scale: Poor                               Pertinent Vitals/Pain Pain Assessment Pain Assessment: 0-10 Pain Score: 5  Pain Location: L knee Pain Descriptors / Indicators: Aching, Sore Pain Intervention(s): Limited activity within patient's tolerance, Monitored during session, Premedicated before session, Ice applied    Home Living Family/patient expects to be discharged to:: Skilled nursing facility                   Additional Comments: [facility]    Prior Function Prior Level of Function : Independent/Modified Independent                     Extremity/Trunk Assessment   Upper Extremity Assessment Upper Extremity  Assessment: Overall WFL for tasks assessed    Lower Extremity Assessment Lower Extremity Assessment: LLE deficits/detail    Cervical / Trunk Assessment Cervical / Trunk Assessment: Normal  Communication   Communication Communication: No apparent difficulties    Cognition Arousal: Alert Behavior During Therapy: WFL for tasks assessed/performed   PT - Cognitive impairments: No apparent impairments                          Following commands: Intact       Cueing Cueing Techniques: Verbal cues, Gestural cues, Tactile cues     General Comments      Exercises Total Joint Exercises Ankle Circles/Pumps: AROM, Both, 15 reps, Supine   Assessment/Plan    PT Assessment Patient needs continued PT services  PT Problem List Decreased strength;Decreased range of motion;Decreased activity tolerance;Decreased balance;Decreased mobility;Decreased knowledge of use of DME;Pain;Decreased safety awareness       PT Treatment Interventions DME instruction;Functional mobility training;Stair training;Gait training;Therapeutic activities;Therapeutic exercise;Patient/family education;Balance training    PT Goals (Current goals can be found in the Care Plan section)  Acute Rehab PT Goals Patient Stated Goal: Regain IND PT Goal Formulation: With patient Time For Goal Achievement: 07/05/23 Potential to Achieve Goals: Good    Frequency 7X/week     Co-evaluation               AM-PAC PT "6 Clicks" Mobility  Outcome Measure Help needed turning from your back to your side while in a flat bed without using bedrails?: A Little Help needed moving from lying on your back to sitting on the side of a flat bed without using bedrails?: A Lot Help needed moving to and from a bed to a chair (including a wheelchair)?: A Lot Help needed standing up from a chair using your arms (e.g., wheelchair or bedside chair)?: A Lot Help needed to walk in hospital room?: A Lot Help needed climbing 3-5 steps with a railing? : A Lot 6 Click Score: 13    End of Session Equipment Utilized During Treatment: Gait belt;Left knee immobilizer Activity Tolerance: Patient tolerated treatment well Patient left: in chair;with call bell/phone within reach;with chair alarm set;with family/visitor present Nurse Communication: Mobility status PT Visit Diagnosis: Difficulty in walking, not elsewhere classified (R26.2)    Time: 1308-6578 PT Time  Calculation (min) (ACUTE ONLY): 41 min   Charges:   PT Evaluation $PT Eval Low Complexity: 1 Low PT Treatments $Gait Training: 8-22 mins PT General Charges $$ ACUTE PT VISIT: 1 Visit         Mauro Kaufmann PT Acute Rehabilitation Services Pager [phone] Office [phone]   Eliav Mechling 06/21/2023, 5:49 PM

## 2023-06-21 NOTE — Anesthesia Procedure Notes (Signed)
 Spinal  Patient location during procedure: OR Start time: 06/21/2023 8:35 AM End time: 06/21/2023 8:39 AM Reason for block: surgical anesthesia Staffing Performed: anesthesiologist  Anesthesiologist: Lannie Fields, DO Performed by: Lannie Fields, DO Authorized by: Lannie Fields, DO   Preanesthetic Checklist Completed: patient identified, IV checked, risks and benefits discussed, surgical consent, monitors and equipment checked, pre-op evaluation and timeout performed Spinal Block Patient position: sitting Prep: DuraPrep and site prepped and draped Patient monitoring: cardiac monitor, continuous pulse ox and blood pressure Approach: midline Location: L3-4 Injection technique: single-shot Needle Needle type: Pencan  Needle gauge: 24 G Needle length: 9 cm Assessment Sensory level: T6 Events: CSF return Additional Notes Functioning IV was confirmed and monitors were applied. Sterile prep and drape, including hand hygiene and sterile gloves were used. The patient was positioned and the spine was prepped. The skin was anesthetized with lidocaine.  Free flow of clear CSF was obtained prior to injecting local anesthetic into the CSF.  The spinal needle aspirated freely following injection.  The needle was carefully withdrawn.  The patient tolerated the procedure well.

## 2023-06-21 NOTE — Transfer of Care (Signed)
 Immediate Anesthesia Transfer of Care Note  Patient: Kathyrn Sheriff Sonneborn  Procedure(s) Performed: LEFT TOTAL KNEE ARTHROPLASTY (Left: Knee)  Patient Location: PACU  Anesthesia Type:Spinal  Level of Consciousness: awake and alert   Airway & Oxygen Therapy: Patient Spontanous Breathing and Patient connected to face mask oxygen  Post-op Assessment: Report given to RN and Post -op Vital signs reviewed and stable  Post vital signs: Reviewed and stable  Last Vitals:  Vitals Value Taken Time  BP 122/62 06/21/23 1028  Temp    Pulse 70 06/21/23 1029  Resp 12 06/21/23 1029  SpO2 100 % 06/21/23 1029  Vitals shown include unfiled device data.  Last Pain:  Vitals:   06/21/23 0810  TempSrc:   PainSc: 0-No pain         Complications: No notable events documented.

## 2023-06-21 NOTE — Op Note (Signed)
 Operative Note  Date of operation: 06/21/2023 Preoperative diagnosis: Left knee primary osteoarthritis Postoperative diagnosis: Same  Procedure: Left cemented total knee arthroplasty  Implants: Biomet/Zimmer persona cemented knee system Implant Name Type Inv. Item Serial No. Manufacturer Lot No. LRB No. Used Action  CEMENT BONE R 1X40 - [id number] Cement CEMENT BONE R 1X40  ZIMMER RECON(ORTH,TRAU,BIO,SG) [id number] Left 2 Implanted  STEM POLY PAT PLY 36M KNEE - [id number] Knees STEM POLY PAT PLY 36M KNEE  ZIMMER RECON(ORTH,TRAU,BIO,SG) [id number] Left 1 Implanted  TIBIA STEM 5 DEG SZ E L KNEE - [id number] Knees TIBIA STEM 5 DEG SZ E L KNEE  ZIMMER RECON(ORTH,TRAU,BIO,SG) [id number] Left 1 Implanted  STEM ARTISURF EF 12 SZ8-11 - [id number] Stem STEM ARTISURF EF 12 SZ8-11  ZIMMER RECON(ORTH,TRAU,BIO,SG) [id number] Left 1 Implanted  FEMORAL KNEE COMP SZ 8 STND LT - [id number] Knees FEMORAL KNEE COMP SZ 8 STND LT  ZIMMER RECON(ORTH,TRAU,BIO,SG) [id number] Left 1 Implanted   Surgeon: Vanita Panda. Magnus Ivan, MD Assistant: Rexene Edison, PA-C  Anesthesia: #1 left lower extremity adductor canal block, #2 spinal, #3 local Tourniquet time: Under 1 hour EBL: Less than 50 cc Antibiotics: IV Ancef Complications: None  Indications: The patient is an 83 year old female with debilitating arthritis involving her left knee.  This has been going on for many years now.  She has had arthroscopic surgery and other interventions conservatively including multiple injections.  At this point her left knee pain is daily and it is 10 out of 10.  It is detrimentally affecting her mobility, her quality of life and her actives daily living to the point she does wish to proceed with a knee replacement on left side.  We have replaced her right knee before so she is fully aware of the risk and benefits that involved with the surgery and the goals of surgery.  Procedure description: After informed consent was obtained and the  appropriate left knee was marked, anesthesia obtained a left lower extremity adductor canal block in the holding room.  The patient was then brought to the operating room and set up on the operating table where spinal anesthesia was obtained.  She was laid in supine position on the operating table and a Foley catheter was placed.  A nonsterile tourniquet is placed around her upper left thigh and her left thigh, knee, leg and ankle were prepped and draped with DuraPrep and sterile drapes including a sterile stockinette.  A timeout was called and she was identified as the correct patient the correct left knee.  An Esmarch was then used to wrap of the left leg and the tourniquet was inflated 300 mm pressure.  With the knee in extended position a direct midline incision was made over the patella and carried proximally distally.  Dissection was carried down to the knee joint and a medial parapatellar arthrotomy was made.  There was a large joint effusion encountered and significant cartilage loss about the knee especially on the lateral compartment the knee with valgus malalignment.  Remnants of the ACL as well as medial lateral meniscus were removed.  We then used an extramedullary based cutting guide for making her proximal tibia cut correction for varus and valgus and a 7 degree slope.  We made this cut to take 2 mm off the low side and we did backed this down to more millimeters.  We then used a intramedullary based cutting guide for distal femur cut setting this for a left knee at 5 degrees externally rotated and a 10 mm distal femoral  cut.  We made that cut without difficulty and brought the knee back down to full extension and had achieved full extension with a 10 mm extension block.  We then back to the femur and put a femoral sizing guide based off the epicondylar axis.  Based off of this we chose a size 8 femur.  We put a 4-in-1 cutting block for a size 8 femur and made our anterior and posterior cuts followed  our chamfer cuts.  We then backed the tibia and chose a size E tibial tray for a left knee for coverage over the tibial plateau said the rotation of the tibial tubercle and the femur.  We did our drill hole and keel punch off of this.  We then trialed our size E left tibia followed by our size 8 left CR standard femur.  We placed our medial congruent 10 mm thickness left polythene insert and we went up to a 12 mm thickness insert we are pleased with range of motion and stability without insert.  We then made a patella cut and drilled 3 holes for a size 32 patella button.  Again with all trial instrumentation the knee we are pleased the range of motion and stability.  We then removed all trial instrumentation of the knee and irrigate the knee with normal saline solution.  We then placed Marcaine with epinephrine around the arthrotomy.  Next with the knee in a flexed position we mixed our cement and dried the knee roll well.  We then cemented our Biomet/Zimmer persona tibial tray for a left knee size E followed by cementing our size 8 left CR standard femur.  We placed our 12 mm thickness left medial congruent polythene insert and cemented our size 32 patella button.  We then held the knee fully extended and compressed while the cemented hardened.  We then let the tourniquet down and hemostasis was obtained with electrocautery.  The arthrotomy was closed with interrupted #1 Vicryl suture followed by 0 Vicryl close deep tissue and 2-0 Vicryl cut subcutaneous tissue.  The skin was closed with staples.  Well-padded sterile dressings applied.  The patient was taken the recovery room.  Rexene Edison, PA-C did assist during the entire case and beginning to end and his assistance was crucial and medically necessary for soft tissue management and retraction, helping guide implant placement and a layered closure of the wound.

## 2023-06-21 NOTE — Interval H&P Note (Signed)
 History and Physical Interval Note: The patient understands that she is here today for a left total knee replacement to treat her significant left knee osteoarthritis and degenerative joint disease.  There has been no acute or interval change in her medical status.  The risks and benefits of surgery have been discussed in detail and informed consent has been obtained.  The left operative knee has been marked.  06/21/2023 6:52 AM  Lindsay Clark  has presented today for surgery, with the diagnosis of left knee osteoarthritis.  The various methods of treatment have been discussed with the patient and family. After consideration of risks, benefits and other options for treatment, the patient has consented to  Procedure(s): LEFT TOTAL KNEE ARTHROPLASTY (Left) as a surgical intervention.  The patient's history has been reviewed, patient examined, no change in status, stable for surgery.  I have reviewed the patient's chart and labs.  Questions were answered to the patient's satisfaction.     Kathryne Hitch

## 2023-06-22 ENCOUNTER — Encounter: Payer: Self-pay | Admitting: Cardiology

## 2023-06-22 DIAGNOSIS — M47816 Spondylosis without myelopathy or radiculopathy, lumbar region: Secondary | ICD-10-CM | POA: Diagnosis present

## 2023-06-22 DIAGNOSIS — I4892 Unspecified atrial flutter: Secondary | ICD-10-CM | POA: Diagnosis present

## 2023-06-22 DIAGNOSIS — Z8616 Personal history of COVID-19: Secondary | ICD-10-CM | POA: Diagnosis not present

## 2023-06-22 DIAGNOSIS — Z7901 Long term (current) use of anticoagulants: Secondary | ICD-10-CM | POA: Diagnosis not present

## 2023-06-22 DIAGNOSIS — E785 Hyperlipidemia, unspecified: Secondary | ICD-10-CM | POA: Diagnosis present

## 2023-06-22 DIAGNOSIS — Z823 Family history of stroke: Secondary | ICD-10-CM | POA: Diagnosis not present

## 2023-06-22 DIAGNOSIS — Z825 Family history of asthma and other chronic lower respiratory diseases: Secondary | ICD-10-CM | POA: Diagnosis not present

## 2023-06-22 DIAGNOSIS — I4819 Other persistent atrial fibrillation: Secondary | ICD-10-CM | POA: Diagnosis present

## 2023-06-22 DIAGNOSIS — Z8249 Family history of ischemic heart disease and other diseases of the circulatory system: Secondary | ICD-10-CM | POA: Diagnosis not present

## 2023-06-22 DIAGNOSIS — Z96651 Presence of right artificial knee joint: Secondary | ICD-10-CM | POA: Diagnosis present

## 2023-06-22 DIAGNOSIS — Z8661 Personal history of infections of the central nervous system: Secondary | ICD-10-CM | POA: Diagnosis not present

## 2023-06-22 DIAGNOSIS — Z79899 Other long term (current) drug therapy: Secondary | ICD-10-CM | POA: Diagnosis not present

## 2023-06-22 DIAGNOSIS — Z800 Family history of malignant neoplasm of digestive organs: Secondary | ICD-10-CM | POA: Diagnosis not present

## 2023-06-22 DIAGNOSIS — M1712 Unilateral primary osteoarthritis, left knee: Secondary | ICD-10-CM | POA: Diagnosis present

## 2023-06-22 DIAGNOSIS — I2510 Atherosclerotic heart disease of native coronary artery without angina pectoris: Secondary | ICD-10-CM | POA: Diagnosis present

## 2023-06-22 DIAGNOSIS — I10 Essential (primary) hypertension: Secondary | ICD-10-CM | POA: Diagnosis present

## 2023-06-22 DIAGNOSIS — Z888 Allergy status to other drugs, medicaments and biological substances status: Secondary | ICD-10-CM | POA: Diagnosis not present

## 2023-06-22 LAB — BASIC METABOLIC PANEL
Anion gap: 10 (ref 5–15)
BUN: 16 mg/dL (ref 8–23)
CO2: 28 mmol/L (ref 22–32)
Calcium: 8.2 mg/dL — ABNORMAL LOW (ref 8.9–10.3)
Chloride: 101 mmol/L (ref 98–111)
Creatinine, Ser: 0.65 mg/dL (ref 0.44–1.00)
GFR, Estimated: >60 mL/min (ref >60)
Glucose, Bld: 139 mg/dL — ABNORMAL HIGH (ref 70–99)
Potassium: 3.7 mmol/L (ref 3.5–5.1)
Sodium: 139 mmol/L (ref 135–145)

## 2023-06-22 LAB — CBC
HCT: 35.6 % — ABNORMAL LOW (ref 36.0–46.0)
Hemoglobin: 11.5 g/dL — ABNORMAL LOW (ref 12.0–15.0)
MCH: 30.9 pg (ref 26.0–34.0)
MCHC: 32.3 g/dL (ref 30.0–36.0)
MCV: 95.7 fL (ref 80.0–100.0)
Platelets: 188 10*3/uL (ref 150–400)
RBC: 3.72 MIL/uL — ABNORMAL LOW (ref 3.87–5.11)
RDW: 12.8 % (ref 11.5–15.5)
WBC: 13.9 10*3/uL — ABNORMAL HIGH (ref 4.0–10.5)
nRBC: 0 % (ref 0.0–0.2)

## 2023-06-22 MED ORDER — VITAMIN D 25 MCG (1000 UNIT) PO TABS
4000.0000 [IU] | ORAL_TABLET | Freq: Every day | ORAL | Status: DC
  Administered 2023-06-22 – 2023-06-24 (×3): 4000 [IU] via ORAL
  Filled 2023-06-22 (×3): qty 4

## 2023-06-22 MED ORDER — VITAMIN D 25 MCG (1000 UNIT) PO TABS
2000.0000 [IU] | ORAL_TABLET | Freq: Every day | ORAL | Status: DC
  Administered 2023-06-22 – 2023-06-23 (×2): 2000 [IU] via ORAL
  Filled 2023-06-22 (×2): qty 2

## 2023-06-22 NOTE — Progress Notes (Signed)
 Physical Therapy Treatment Patient Details Name: Lindsay Clark MRN: [id number] DOB: 1940/11/01 Today's Date: 06/22/2023   History of Present Illness Pt s/p L TKR and with hs of R TKR, Lupus, DDD and a-fib    PT Comments  Pt very cooperative and performed therex program with assist before up to ambulate limited distance into hall.  Pt limited by fatigue and c/o mild dizziness.    If plan is discharge home, recommend the following: A little help with walking and/or transfers;A little help with bathing/dressing/bathroom;Assistance with cooking/housework;Assist for transportation;Help with stairs or ramp for entrance   Can travel by private vehicle        Equipment Recommendations  None recommended by PT    Recommendations for Other Services       Precautions / Restrictions Precautions Precautions: Knee;Fall Required Braces or Orthoses: Knee Immobilizer - Left Knee Immobilizer - Left: Discontinue once straight leg raise with < 10 degree lag Restrictions Weight Bearing Restrictions Per Provider Order: No Other Position/Activity Restrictions: WBAT     Mobility  Bed Mobility               General bed mobility comments: Pt up in chair and requests back to same    Transfers Overall transfer level: Needs assistance Equipment used: Rolling walker (2 wheels) Transfers: Sit to/from Stand Sit to Stand: Min assist, Mod assist, From elevated surface           General transfer comment: cues for LE management, use of UEs to self assist; pt attempting to stumble bkwd requiring assist to prevent falling    Ambulation/Gait Ambulation/Gait assistance: Min assist, Mod assist Gait Distance (Feet): 18 Feet Assistive device: Rolling walker (2 wheels) Gait Pattern/deviations: Step-to pattern, Decreased step length - right, Decreased step length - left, Shuffle, Trunk flexed Gait velocity: decr     General Gait Details: cues for posture, sequence and position from  [employer]             Wheelchair Mobility     Tilt Bed    Modified Rankin (Stroke Patients Only)       Balance Overall balance assessment: Needs assistance Sitting-balance support: No upper extremity supported, Feet supported Sitting balance-Leahy Scale: Good     Standing balance support: Bilateral upper extremity supported, Single extremity supported Standing balance-Leahy Scale: Poor                              Communication Communication Communication: No apparent difficulties  Cognition Arousal: Alert Behavior During Therapy: WFL for tasks assessed/performed   PT - Cognitive impairments: No apparent impairments                         Following commands: Intact      Cueing Cueing Techniques: Verbal cues, Gestural cues, Tactile cues  Exercises Total Joint Exercises Ankle Circles/Pumps: AROM, Both, 15 reps, Supine Quad Sets: AROM, Both, 10 reps, Supine Heel Slides: AAROM, Left, 15 reps, Supine Straight Leg Raises: AAROM, Left, 10 reps, Supine Goniometric ROM: AAROM at L knee -5 - 40    General Comments        Pertinent Vitals/Pain Pain Assessment Pain Assessment: 0-10 Pain Score: 6  Pain Location: L knee Pain Descriptors / Indicators: Aching, Sore Pain Intervention(s): Limited activity within patient's tolerance, Monitored during session, Premedicated before session, Ice applied    Home Living  Prior Function            PT Goals (current goals can now be found in the care plan section) Acute Rehab PT Goals Patient Stated Goal: Regain IND PT Goal Formulation: With patient Time For Goal Achievement: 07/05/23 Potential to Achieve Goals: Good Progress towards PT goals: Progressing toward goals    Frequency    7X/week      PT Plan      Co-evaluation              AM-PAC PT "6 Clicks" Mobility   Outcome Measure  Help needed turning from your back to your side while  in a flat bed without using bedrails?: A Little Help needed moving from lying on your back to sitting on the side of a flat bed without using bedrails?: A Lot Help needed moving to and from a bed to a chair (including a wheelchair)?: A Lot Help needed standing up from a chair using your arms (e.g., wheelchair or bedside chair)?: A Lot Help needed to walk in hospital room?: A Lot Help needed climbing 3-5 steps with a railing? : A Lot 6 Click Score: 13    End of Session Equipment Utilized During Treatment: Gait belt;Left knee immobilizer Activity Tolerance: Patient tolerated treatment well Patient left: in chair;with call bell/phone within reach;with chair alarm set;with family/visitor present Nurse Communication: Mobility status PT Visit Diagnosis: Difficulty in walking, not elsewhere classified (R26.2)     Time: 1610-9604 PT Time Calculation (min) (ACUTE ONLY): 37 min  Charges:    $Gait Training: 8-22 mins $Therapeutic Exercise: 8-22 mins PT General Charges $$ ACUTE PT VISIT: 1 Visit                     Mauro Kaufmann PT Acute Rehabilitation Services Pager [phone] Office [phone]    Lindsay Clark 06/22/2023, 12:42 PM

## 2023-06-22 NOTE — Progress Notes (Signed)
 Subjective: 1 Day Post-Op Procedure(s) (LRB): LEFT TOTAL KNEE ARTHROPLASTY (Left) Patient reports pain as moderate.  She states that the SCDs kept her up at night and she would prefer to not have these on tonight.  Eliquis has been resumed.  She is requesting to be started back on vitamin D.  Objective: Vital signs in last 24 hours: Temp:  [97.9 F (36.6 C)-98.5 F (36.9 C)] 98.2 F (36.8 C) (03/01 0644) Pulse Rate:  [67-90] 84 (03/01 0644) Resp:  [13-18] 16 (03/01 0644) BP: (131-158)/(60-95) 139/71 (03/01 0644) SpO2:  [93 %-99 %] 95 % (03/01 0644) Weight:  [93.9 kg] 93.9 kg (02/28 1230)  Intake/Output from previous day: 02/28 0701 - 03/01 0700 In: 3309.8 [P.O.:1077; I.V.:1835.4; IV Piggyback:397.4] Out: 4395 [Urine:4370; Blood:25] Intake/Output this shift: No intake/output data recorded.  Recent Labs    06/22/23 0405  HGB 11.5*   Recent Labs    06/22/23 0405  WBC 13.9*  RBC 3.72*  HCT 35.6*  PLT 188   Recent Labs    06/22/23 0405  NA 139  K 3.7  CL 101  CO2 28  BUN 16  CREATININE 0.65  GLUCOSE 139*  CALCIUM 8.2*   No results for input(s): "LABPT", "INR" in the last 72 hours.  Sensation intact distally Intact pulses distally Dorsiflexion/Plantar flexion intact Incision: dressing C/D/I Compartment soft   Assessment/Plan: 1 Day Post-Op Procedure(s) (LRB): LEFT TOTAL KNEE ARTHROPLASTY (Left) Up with therapy Discharge to SNF on Monday.  The patient is a member of [facility]      Kathryne Hitch 06/22/2023, 11:03 AM

## 2023-06-22 NOTE — Plan of Care (Signed)
   Problem: Coping: Goal: Level of anxiety will decrease Outcome: Progressing   Problem: Pain Managment: Goal: General experience of comfort will improve and/or be controlled Outcome: Progressing   Problem: Safety: Goal: Ability to remain free from injury will improve Outcome: Progressing

## 2023-06-22 NOTE — Progress Notes (Signed)
 Physical Therapy Treatment Patient Details Name: Lindsay Clark MRN: [id number] DOB: March 04, 1941 Today's Date: 06/22/2023   History of Present Illness Pt s/p L TKR and with hs of R TKR, Lupus, DDD and a-fib    PT Comments  Pt just back to bed with nursing and c/o fatigue, nausea and mild dizziness at rest.  OOB deferred at this time but pt agreeable to bed exercises and completed same with assist.  RN alerted to request for nausea meds.    If plan is discharge home, recommend the following: A little help with walking and/or transfers;A little help with bathing/dressing/bathroom;Assistance with cooking/housework;Assist for transportation;Help with stairs or ramp for entrance   Can travel by private vehicle        Equipment Recommendations  None recommended by PT    Recommendations for Other Services       Precautions / Restrictions Precautions Precautions: Knee;Fall Required Braces or Orthoses: Knee Immobilizer - Left Knee Immobilizer - Left: Discontinue once straight leg raise with < 10 degree lag Restrictions Weight Bearing Restrictions Per Provider Order: No Other Position/Activity Restrictions: WBAT     Mobility  Bed Mobility               General bed mobility comments: Pt up in chair and requests back to same    Transfers Overall transfer level: Needs assistance Equipment used: Rolling walker (2 wheels) Transfers: Sit to/from Stand Sit to Stand: Min assist, Mod assist, From elevated surface           General transfer comment: cues for LE management, use of UEs to self assist; pt attempting to stumble bkwd requiring assist to prevent falling    Ambulation/Gait Ambulation/Gait assistance: Min assist, Mod assist Gait Distance (Feet): 18 Feet Assistive device: Rolling walker (2 wheels) Gait Pattern/deviations: Step-to pattern, Decreased step length - right, Decreased step length - left, Shuffle, Trunk flexed Gait velocity: decr     General Gait Details:  cues for posture, sequence and position from [employer]             Wheelchair Mobility     Tilt Bed    Modified Rankin (Stroke Patients Only)       Balance Overall balance assessment: Needs assistance Sitting-balance support: No upper extremity supported, Feet supported Sitting balance-Leahy Scale: Good     Standing balance support: Bilateral upper extremity supported, Single extremity supported Standing balance-Leahy Scale: Poor                              Communication Communication Communication: No apparent difficulties  Cognition Arousal: Alert Behavior During Therapy: WFL for tasks assessed/performed   PT - Cognitive impairments: No apparent impairments                         Following commands: Intact      Cueing Cueing Techniques: Verbal cues, Gestural cues, Tactile cues  Exercises Total Joint Exercises Ankle Circles/Pumps: AROM, Both, 15 reps, Supine Quad Sets: AROM, Both, 10 reps, Supine Heel Slides: AAROM, Left, 15 reps, Supine Straight Leg Raises: AAROM, Left, 10 reps, Supine Goniometric ROM: AAROM at L knee -5 - 40    General Comments        Pertinent Vitals/Pain Pain Assessment Pain Assessment: 0-10 Pain Score: 6  Pain Location: L knee Pain Descriptors / Indicators: Aching, Sore Pain Intervention(s): Limited activity within patient's tolerance, Monitored during session, Premedicated before  session, Ice applied    Home Living                          Prior Function            PT Goals (current goals can now be found in the care plan section) Acute Rehab PT Goals Patient Stated Goal: Regain IND PT Goal Formulation: With patient Time For Goal Achievement: 07/05/23 Potential to Achieve Goals: Good Progress towards PT goals: Progressing toward goals    Frequency    7X/week      PT Plan      Co-evaluation              AM-PAC PT "6 Clicks" Mobility   Outcome Measure  Help  needed turning from your back to your side while in a flat bed without using bedrails?: A Little Help needed moving from lying on your back to sitting on the side of a flat bed without using bedrails?: A Lot Help needed moving to and from a bed to a chair (including a wheelchair)?: A Lot Help needed standing up from a chair using your arms (e.g., wheelchair or bedside chair)?: A Lot Help needed to walk in hospital room?: A Lot Help needed climbing 3-5 steps with a railing? : A Lot 6 Click Score: 13    End of Session Equipment Utilized During Treatment: Gait belt;Left knee immobilizer Activity Tolerance: Other (comment) (limited by nausea, fatigue and c/o mild dizziness at rest) Patient left: in bed;with call bell/phone within reach;with bed alarm set;with family/visitor present Nurse Communication: Mobility status PT Visit Diagnosis: Difficulty in walking, not elsewhere classified (R26.2)     Time: 1610-9604 PT Time Calculation (min) (ACUTE ONLY): 21 min  Charges:    $Gait Training: 8-22 mins $Therapeutic Exercise: 8-22 mins PT General Charges $$ ACUTE PT VISIT: 1 Visit                     Mauro Kaufmann PT Acute Rehabilitation Services Pager [phone] Office [phone]    Karlen Barbar 06/22/2023, 3:26 PM

## 2023-06-22 NOTE — Discharge Instructions (Signed)
 Per [facility] clinic policy, our goal is ensure optimal postoperative pain control with a multimodal pain management strategy. For all [facility] patients, our goal is to wean post-operative narcotic medications by 6 weeks post-operatively. If this is not possible due to utilization of pain medication prior to surgery, your [facility] doctor will support your acute post-operative pain control for the first 6 weeks postoperatively, with a plan to transition you back to your primary pain team following that. Lindsay Clark will work to ensure a Therapist, occupational.  INSTRUCTIONS AFTER JOINT REPLACEMENT   Remove items at home which could result in a fall. This includes throw rugs or furniture in walking pathways ICE to the affected joint every three hours while awake for 30 minutes at a time, for at least the first 3-5 days, and then as needed for pain and swelling.  Continue to use ice for pain and swelling. You may notice swelling that will progress down to the foot and ankle.  This is normal after surgery.  Elevate your leg when you are not up walking on it.   Continue to use the breathing machine you got in the hospital (incentive spirometer) which will help keep your temperature down.  It is common for your temperature to cycle up and down following surgery, especially at night when you are not up moving around and exerting yourself.  The breathing machine keeps your lungs expanded and your temperature down.   DIET:  As you were doing prior to hospitalization, we recommend a well-balanced diet.  DRESSING / WOUND CARE / SHOWERING  Keep the surgical dressing until follow up.  The dressing is water proof, so you can shower without any extra covering.  IF THE DRESSING FALLS OFF or the wound gets wet inside, change the dressing with sterile gauze.  Please use good hand washing techniques before changing the dressing.  Do not use any lotions or creams on the incision until instructed by your surgeon.     ACTIVITY  Increase activity slowly as tolerated, but follow the weight bearing instructions below.   No driving for 6 weeks or until further direction given by your physician.  You cannot drive while taking narcotics.  No lifting or carrying greater than 10 lbs. until further directed by your surgeon. Avoid periods of inactivity such as sitting longer than an hour when not asleep. This helps prevent blood clots.  You may return to work once you are authorized by your doctor.     WEIGHT BEARING   Weight bearing as tolerated with assist device (walker, cane, etc) as directed, use it as long as suggested by your surgeon or therapist, typically at least 4-6 weeks.   EXERCISES  Results after joint replacement surgery are often greatly improved when you follow the exercise, range of motion and muscle strengthening exercises prescribed by your doctor. Safety measures are also important to protect the joint from further injury. Any time any of these exercises cause you to have increased pain or swelling, decrease what you are doing until you are comfortable again and then slowly increase them. If you have problems or questions, call your caregiver or physical therapist for advice.   Rehabilitation is important following a joint replacement. After just a few days of immobilization, the muscles of the leg can become weakened and shrink (atrophy).  These exercises are designed to build up the tone and strength of the thigh and leg muscles and to improve motion. Often times heat used for twenty to thirty minutes before  working out will loosen up your tissues and help with improving the range of motion but do not use heat for the first two weeks following surgery (sometimes heat can increase post-operative swelling).   These exercises can be done on a training (exercise) mat, on the floor, on a table or on a bed. Use whatever works the best and is most comfortable for you.    Use music or television  while you are exercising so that the exercises are a pleasant break in your day. This will make your life better with the exercises acting as a break in your routine that you can look forward to.   Perform all exercises about fifteen times, three times per day or as directed.  You should exercise both the operative leg and the other leg as well.  Exercises include:   Quad Sets - Tighten up the muscle on the front of the thigh (Quad) and hold for 5-10 seconds.   Straight Leg Raises - With your knee straight (if you were given a brace, keep it on), lift the leg to 60 degrees, hold for 3 seconds, and slowly lower the leg.  Perform this exercise against resistance later as your leg gets stronger.  Leg Slides: Lying on your back, slowly slide your foot toward your buttocks, bending your knee up off the floor (only go as far as is comfortable). Then slowly slide your foot back down until your leg is flat on the floor again.  Angel Wings: Lying on your back spread your legs to the side as far apart as you can without causing discomfort.  Hamstring Strength:  Lying on your back, push your heel against the floor with your leg straight by tightening up the muscles of your buttocks.  Repeat, but this time bend your knee to a comfortable angle, and push your heel against the floor.  You may put a pillow under the heel to make it more comfortable if necessary.   A rehabilitation program following joint replacement surgery can speed recovery and prevent re-injury in the future due to weakened muscles. Contact your doctor or a physical therapist for more information on knee rehabilitation.    CONSTIPATION  Constipation is defined medically as fewer than three stools per week and severe constipation as less than one stool per week.  Even if you have a regular bowel pattern at home, your normal regimen is likely to be disrupted due to multiple reasons following surgery.  Combination of anesthesia, postoperative  narcotics, change in appetite and fluid intake all can affect your bowels.   YOU MUST use at least one of the following options; they are listed in order of increasing strength to get the job done.  They are all available over the counter, and you may need to use some, POSSIBLY even all of these options:    Drink plenty of fluids (prune juice may be helpful) and high fiber foods Colace 100 mg by mouth twice a day  Senokot for constipation as directed and as needed Dulcolax (bisacodyl), take with full glass of water  Miralax (polyethylene glycol) once or twice a day as needed.  If you have tried all these things and are unable to have a bowel movement in the first 3-4 days after surgery call either your surgeon or your primary doctor.    If you experience loose stools or diarrhea, hold the medications until you stool forms back up.  If your symptoms do not get better within 1 week  or if they get worse, check with your doctor.  If you experience "the worst abdominal pain ever" or develop nausea or vomiting, please contact the office immediately for further recommendations for treatment.   ITCHING:  If you experience itching with your medications, try taking only a single pain pill, or even half a pain pill at a time.  You can also use Benadryl over the counter for itching or also to help with sleep.   TED HOSE STOCKINGS:  Use stockings on both legs until for at least 2 weeks or as directed by physician office. They may be removed at night for sleeping.  MEDICATIONS:  See your medication summary on the "After Visit Summary" that nursing will review with you.  You may have some home medications which will be placed on hold until you complete the course of blood thinner medication.  It is important for you to complete the blood thinner medication as prescribed.  PRECAUTIONS:  If you experience chest pain or shortness of breath - call 911 immediately for transfer to the hospital emergency department.    If you develop a fever greater that 101 F, purulent drainage from wound, increased redness or drainage from wound, foul odor from the wound/dressing, or calf pain - CONTACT YOUR SURGEON.                                                   FOLLOW-UP APPOINTMENTS:  If you do not already have a post-op appointment, please call the office for an appointment to be seen by your surgeon.  Guidelines for how soon to be seen are listed in your "After Visit Summary", but are typically between 1-4 weeks after surgery.  OTHER INSTRUCTIONS:   Knee Replacement:  Do not place pillow under knee, focus on keeping the knee straight while resting. CPM instructions: 0-90 degrees, 2 hours in the morning, 2 hours in the afternoon, and 2 hours in the evening. Place foam block, curve side up under heel at all times except when in CPM or when walking.  DO NOT modify, tear, cut, or change the foam block in any way.  POST-OPERATIVE OPIOID TAPER INSTRUCTIONS: It is important to wean off of your opioid medication as soon as possible. If you do not need pain medication after your surgery it is ok to stop day one. Opioids include: Codeine, Hydrocodone(Norco, Vicodin), Oxycodone(Percocet, oxycontin) and hydromorphone amongst others.  Long term and even short term use of opiods can cause: Increased pain response Dependence Constipation Depression Respiratory depression And more.  Withdrawal symptoms can include Flu like symptoms Nausea, vomiting And more Techniques to manage these symptoms Hydrate well Eat regular healthy meals Stay active Use relaxation techniques(deep breathing, meditating, yoga) Do Not substitute Alcohol to help with tapering If you have been on opioids for less than two weeks and do not have pain than it is ok to stop all together.  Plan to wean off of opioids This plan should start within one week post op of your joint replacement. Maintain the same interval or time between taking each dose  and first decrease the dose.  Cut the total daily intake of opioids by one tablet each day Next start to increase the time between doses. The last dose that should be eliminated is the evening dose.   MAKE SURE YOU:  Understand these instructions.  Get help right away if you are not doing well or get worse.    Thank you for letting us be a part of your medical care team.  It is a privilege we respect greatly.  We hope these instructions will help you stay on track for a fast and full recovery!      Dental Antibiotics:  In most cases prophylactic antibiotics for Dental procdeures after total joint surgery are not necessary.  Exceptions are as follows:  1. History of prior total joint infection  2. Severely immunocompromised (Organ Transplant, cancer chemotherapy, Rheumatoid biologic meds such as Humera)  3. Poorly controlled diabetes (A1C &gt; 8.0, blood glucose over 200)  If you have one of these conditions, contact your surgeon for an antibiotic prescription, prior to your dental procedure.

## 2023-06-22 NOTE — Care Management Obs Status (Signed)
 MEDICARE OBSERVATION STATUS NOTIFICATION   Patient Details  Name: Lindsay Clark MRN: [id number] Date of Birth: 1941/01/14   Medicare Observation Status Notification Given:  Yes    MahabirOlegario Messier, RN 06/22/2023, 9:33 AM

## 2023-06-23 NOTE — Progress Notes (Signed)
 Physical Therapy Treatment Patient Details Name: Lindsay Clark MRN: [id number] DOB: 04-02-1941 Today's Date: 06/23/2023   History of Present Illness Pt s/p L TKR and with hs of R TKR, Lupus, DDD and a-fib    PT Comments  Pt continues cooperative and with noted improvement in mobility this pm including decreased level of assist for most tasks and increased distance ambulated.  Pt continues to require increased time and limited by fatigue.    If plan is discharge home, recommend the following: A little help with walking and/or transfers;A little help with bathing/dressing/bathroom;Assistance with cooking/housework;Assist for transportation;Help with stairs or ramp for entrance   Can travel by private vehicle        Equipment Recommendations  None recommended by PT    Recommendations for Other Services       Precautions / Restrictions Precautions Precautions: Knee;Fall Required Braces or Orthoses: Knee Immobilizer - Left Knee Immobilizer - Left: Discontinue once straight leg raise with < 10 degree lag Restrictions Weight Bearing Restrictions Per Provider Order: No LLE Weight Bearing Per Provider Order: Weight bearing as tolerated Other Position/Activity Restrictions: WBAT     Mobility  Bed Mobility Overal bed mobility: Needs Assistance Bed Mobility: Supine to Sit, Sit to Supine     Supine to sit: Min assist, Mod assist Sit to supine: Min assist, Mod assist   General bed mobility comments: INcreased time with cues for sequence and use of R LE to self assist.  Physical assist to manage L LE, control trunk and complete rotation on bed using bed pad    Transfers Overall transfer level: Needs assistance Equipment used: Rolling walker (2 wheels) Transfers: Sit to/from Stand, Bed to chair/wheelchair/BSC Sit to Stand: Min assist   Step pivot transfers: Min assist       General transfer comment: cues for LE management, use of UEs to self assist; up from EOB, to/from  recliner and step/pvt recliner to bedside    Ambulation/Gait Ambulation/Gait assistance: Min assist Gait Distance (Feet): 43 Feet Assistive device: Rolling walker (2 wheels) Gait Pattern/deviations: Step-to pattern, Decreased step length - right, Decreased step length - left, Shuffle, Trunk flexed Gait velocity: decr     General Gait Details: Increased time with cues for posture, sequence and position from [employer]             Wheelchair Mobility     Tilt Bed    Modified Rankin (Stroke Patients Only)       Balance Overall balance assessment: Needs assistance Sitting-balance support: No upper extremity supported, Feet supported Sitting balance-Leahy Scale: Good     Standing balance support: Bilateral upper extremity supported, Single extremity supported Standing balance-Leahy Scale: Poor                              Communication Communication Communication: No apparent difficulties  Cognition Arousal: Alert Behavior During Therapy: WFL for tasks assessed/performed   PT - Cognitive impairments: No apparent impairments                         Following commands: Intact      Cueing Cueing Techniques: Verbal cues, Gestural cues, Tactile cues  Exercises Total Joint Exercises Ankle Circles/Pumps: AROM, Both, 15 reps, Supine Quad Sets: AROM, Both, 10 reps, Supine Heel Slides: AAROM, Left, 15 reps, Supine Straight Leg Raises: AAROM, Left, 10 reps, Supine Goniometric ROM: AAROM L knee -5 - 40  General Comments        Pertinent Vitals/Pain Pain Assessment Pain Assessment: 0-10 Pain Score: 5  Pain Location: L knee Pain Descriptors / Indicators: Aching, Sore Pain Intervention(s): Limited activity within patient's tolerance, Monitored during session, Premedicated before session, Ice applied    Home Living                          Prior Function            PT Goals (current goals can now be found in the care  plan section) Acute Rehab PT Goals Patient Stated Goal: Regain IND PT Goal Formulation: With patient Time For Goal Achievement: 07/05/23 Potential to Achieve Goals: Good Progress towards PT goals: Progressing toward goals    Frequency    7X/week      PT Plan      Co-evaluation              AM-PAC PT "6 Clicks" Mobility   Outcome Measure  Help needed turning from your back to your side while in a flat bed without using bedrails?: A Little Help needed moving from lying on your back to sitting on the side of a flat bed without using bedrails?: A Lot Help needed moving to and from a bed to a chair (including a wheelchair)?: A Little Help needed standing up from a chair using your arms (e.g., wheelchair or bedside chair)?: A Little Help needed to walk in hospital room?: A Little Help needed climbing 3-5 steps with a railing? : A Lot 6 Click Score: 16    End of Session Equipment Utilized During Treatment: Gait belt;Left knee immobilizer Activity Tolerance: Patient tolerated treatment well;Patient limited by fatigue Patient left: in bed;with call bell/phone within reach;with bed alarm set;with family/visitor present Nurse Communication: Mobility status PT Visit Diagnosis: Difficulty in walking, not elsewhere classified (R26.2)     Time: 1410-1447 PT Time Calculation (min) (ACUTE ONLY): 37 min  Charges:    $Gait Training: 8-22 mins $Therapeutic Activity: 8-22 mins PT General Charges $$ ACUTE PT VISIT: 1 Visit                     Mauro Kaufmann PT Acute Rehabilitation Services Pager [phone] Office [phone]    Drena Ham 06/23/2023, 3:52 PM

## 2023-06-23 NOTE — Progress Notes (Signed)
 Physical Therapy Treatment Patient Details Name: Lindsay Clark MRN: [id number] DOB: 1940-07-16 Today's Date: 06/23/2023   History of Present Illness Pt s/p L TKR and with hs of R TKR, Lupus, DDD and a-fib    PT Comments  Pt continues cooperative but struggling with all tasks 2* pain/fatigue/nausea.  Pt performed therex with assist, up to ambulate limited distance in room and assisted recliner<>BSC for toileting. Pt plans dc to rehab unit at [facility] tomorrow.    If plan is discharge home, recommend the following: A little help with walking and/or transfers;A little help with bathing/dressing/bathroom;Assistance with cooking/housework;Assist for transportation;Help with stairs or ramp for entrance   Can travel by private vehicle        Equipment Recommendations  None recommended by PT    Recommendations for Other Services       Precautions / Restrictions Precautions Precautions: Knee;Fall Required Braces or Orthoses: Knee Immobilizer - Left Knee Immobilizer - Left: Discontinue once straight leg raise with < 10 degree lag Restrictions Weight Bearing Restrictions Per Provider Order: No LLE Weight Bearing Per Provider Order: Weight bearing as tolerated Other Position/Activity Restrictions: WBAT     Mobility  Bed Mobility Overal bed mobility: Needs Assistance Bed Mobility: Supine to Sit     Supine to sit: Mod assist     General bed mobility comments: INcreased time with cues for sequence and use of R LE to self assist.  Physical assist to manage L LE, control trunk and complete rotation on bed using bed pad    Transfers Overall transfer level: Needs assistance Equipment used: Rolling walker (2 wheels) Transfers: Sit to/from Stand Sit to Stand: Mod assist, From elevated surface           General transfer comment: cues for LE management, use of UEs to self assist; up from EOB, to/from recliner and step/pvt recliner<>BSC    Ambulation/Gait Ambulation/Gait  assistance: Min assist, Mod assist Gait Distance (Feet): 8 Feet Assistive device: Rolling walker (2 wheels) Gait Pattern/deviations: Step-to pattern, Decreased step length - right, Decreased step length - left, Shuffle, Trunk flexed Gait velocity: decr     General Gait Details: cues for posture, sequence and position from [employer]             Wheelchair Mobility     Tilt Bed    Modified Rankin (Stroke Patients Only)       Balance Overall balance assessment: Needs assistance Sitting-balance support: No upper extremity supported, Feet supported Sitting balance-Leahy Scale: Good     Standing balance support: Bilateral upper extremity supported, Single extremity supported Standing balance-Leahy Scale: Poor                              Communication Communication Communication: No apparent difficulties  Cognition Arousal: Alert Behavior During Therapy: WFL for tasks assessed/performed   PT - Cognitive impairments: No apparent impairments                         Following commands: Intact      Cueing Cueing Techniques: Verbal cues, Gestural cues, Tactile cues  Exercises Total Joint Exercises Ankle Circles/Pumps: AROM, Both, 15 reps, Supine Quad Sets: AROM, Both, 10 reps, Supine Heel Slides: AAROM, Left, 15 reps, Supine Straight Leg Raises: AAROM, Left, 10 reps, Supine Goniometric ROM: AAROM L knee -5 - 40    General Comments  Pertinent Vitals/Pain Pain Assessment Pain Assessment: 0-10 Pain Score: 5  Pain Location: L knee Pain Descriptors / Indicators: Aching, Sore Pain Intervention(s): Limited activity within patient's tolerance, Monitored during session, Premedicated before session    Home Living                          Prior Function            PT Goals (current goals can now be found in the care plan section) Acute Rehab PT Goals Patient Stated Goal: Regain IND PT Goal Formulation: With  patient Time For Goal Achievement: 07/05/23 Potential to Achieve Goals: Good Progress towards PT goals: Progressing toward goals    Frequency    7X/week      PT Plan      Co-evaluation              AM-PAC PT "6 Clicks" Mobility   Outcome Measure  Help needed turning from your back to your side while in a flat bed without using bedrails?: A Little Help needed moving from lying on your back to sitting on the side of a flat bed without using bedrails?: A Lot Help needed moving to and from a bed to a chair (including a wheelchair)?: A Lot Help needed standing up from a chair using your arms (e.g., wheelchair or bedside chair)?: A Lot Help needed to walk in hospital room?: A Lot Help needed climbing 3-5 steps with a railing? : A Lot 6 Click Score: 13    End of Session Equipment Utilized During Treatment: Gait belt;Left knee immobilizer Activity Tolerance: Patient limited by fatigue;Other (comment) (nausea) Patient left: in chair;with call bell/phone within reach;with chair alarm set Nurse Communication: Mobility status PT Visit Diagnosis: Difficulty in walking, not elsewhere classified (R26.2)     Time: 1610-9604 PT Time Calculation (min) (ACUTE ONLY): 48 min  Charges:    $Gait Training: 8-22 mins $Therapeutic Exercise: 8-22 mins $Therapeutic Activity: 8-22 mins PT General Charges $$ ACUTE PT VISIT: 1 Visit                     Mauro Kaufmann PT Acute Rehabilitation Services Pager [phone] Office [phone]    Lindsay Clark 06/23/2023, 11:05 AM

## 2023-06-23 NOTE — Progress Notes (Signed)
 Patient ID: Lindsay Clark, female   DOB: [birth year], 83 y.o.   MRN: [id number]   Subjective: 2 Days Post-Op Procedure(s) (LRB): LEFT TOTAL KNEE ARTHROPLASTY (Left) Patient reports pain as moderate and severe.  Pain worse since block wore off. " I do not remember the other knee hurting this bad"   Objective: Vital signs in last 24 hours: Temp:  [97.5 F (36.4 C)-99.1 F (37.3 C)] 98.7 F (37.1 C) (03/02 0602) Pulse Rate:  [85-100] 95 (03/02 0602) Resp:  [17-18] 18 (03/02 0602) BP: (131-158)/(64-92) 148/64 (03/02 0602) SpO2:  [93 %-95 %] 93 % (03/02 0602)  Intake/Output from previous day: 03/01 0701 - 03/02 0700 In: 240 [P.O.:240] Out: 1050 [Urine:1050] Intake/Output this shift: No intake/output data recorded.  Recent Labs    06/22/23 0405  HGB 11.5*   Recent Labs    06/22/23 0405  WBC 13.9*  RBC 3.72*  HCT 35.6*  PLT 188   Recent Labs    06/22/23 0405  NA 139  K 3.7  CL 101  CO2 28  BUN 16  CREATININE 0.65  GLUCOSE 139*  CALCIUM 8.2*   No results for input(s): "LABPT", "INR" in the last 72 hours.  Neurologically intact No results found.  Assessment/Plan: 2 Days Post-Op Procedure(s) (LRB): LEFT TOTAL KNEE ARTHROPLASTY (Left) Plan: therapy arrived. Plan [facility] bed Monday  Eldred Manges 06/23/2023, 9:57 AM

## 2023-06-24 ENCOUNTER — Encounter (HOSPITAL_COMMUNITY): Payer: Self-pay | Admitting: Orthopaedic Surgery

## 2023-06-24 ENCOUNTER — Telehealth: Payer: Self-pay

## 2023-06-24 MED ORDER — COLCHICINE 0.6 MG PO TABS
0.6000 mg | ORAL_TABLET | Freq: Once | ORAL | Status: AC
  Administered 2023-06-24: 0.6 mg via ORAL
  Filled 2023-06-24: qty 1

## 2023-06-24 MED ORDER — COLCHICINE 0.6 MG PO TABS: 0.6000 mg | ORAL_TABLET | Freq: Every day | ORAL | 0 refills | Status: DC | PRN

## 2023-06-24 MED ORDER — KETOROLAC TROMETHAMINE 15 MG/ML IJ SOLN: 7.5000 mg | Freq: Once | INTRAMUSCULAR | Status: DC

## 2023-06-24 MED ORDER — METHOCARBAMOL 500 MG PO TABS: 500.0000 mg | ORAL_TABLET | Freq: Four times a day (QID) | ORAL | 0 refills | Status: DC | PRN

## 2023-06-24 MED ORDER — OXYCODONE HCL 5 MG PO TABS: 5.0000 mg | ORAL_TABLET | ORAL | 0 refills | Status: DC | PRN

## 2023-06-24 NOTE — Discharge Summary (Signed)
 Patient ID: Lindsay Clark MRN: [id number] DOB/AGE: 1940/12/04 83 y.o.  Admit date: 06/21/2023 Discharge date: 06/24/2023  Admission Diagnoses:  Principal Problem:   Primary osteoarthritis of left knee Active Problems:   Status post total left knee replacement   Discharge Diagnoses:  Same  Past Medical History:  Diagnosis Date   Atherosclerosis    mild cerebral   Atrial fibrillation and flutter (HCC)    Autoimmune disorder (HCC)    Autoimmune encephalitis    Cervical disc disease    with spondylosis   Cervical spondylosis    Chronic dermatitis    eczematous   Colitis    COVID 10/2020   DDD (degenerative disc disease), lumbar    Frozen shoulder    left   Hyperlipidemia    Hypertension    IBS (irritable bowel syndrome)    with diarrhea Pt stated she never had this   Lateral epicondylitis    Lumbar spondylosis    Lupus    cutaneous   Movement disorder    Multiple lesions on CT of brain and spine    reports lesions from previous CT scan at [facility]    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   PONV (postoperative nausea and vomiting)    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   Torn meniscus    left    Surgeries: Procedure(s): LEFT TOTAL KNEE ARTHROPLASTY on 06/21/2023   Consultants:   Discharged Condition: Improved  Hospital Course: RAKEISHA NYCE is an 83 y.o. female who was admitted 06/21/2023 for operative treatment ofPrimary osteoarthritis of left knee. Patient has severe unremitting pain that affects sleep, daily activities, and work/hobbies. After pre-op clearance the patient was taken to the operating room on 06/21/2023 and underwent  Procedure(s): LEFT TOTAL KNEE ARTHROPLASTY.    Patient was given perioperative antibiotics:  Anti-infectives (From admission, onward)    Start     Dose/Rate Route Frequency Ordered Stop   06/21/23 2245  hydroxychloroquine (PLAQUENIL) tablet 200 mg        200 mg Oral 2 times daily 06/21/23 2159     06/21/23 1500   ceFAZolin (ANCEF) IVPB 2g/100 mL premix        2 g 200 mL/hr over 30 Minutes Intravenous Every 6 hours 06/21/23 1220 06/21/23 2122   06/21/23 0700  ceFAZolin (ANCEF) IVPB 2g/100 mL premix        2 g 200 mL/hr over 30 Minutes Intravenous On call to O.R. 06/21/23 4098 06/21/23 0847        Patient was given sequential compression devices, early ambulation, and chemoprophylaxis to prevent DVT.  Patient benefited maximally from hospital stay and there were no complications.    Recent vital signs: Patient Vitals for the past 24 hrs:  BP Temp Temp src Pulse Resp SpO2  06/24/23 0607 (!) 140/50 98.2 F (36.8 C) -- 84 18 96 %  06/23/23 2157 127/69 (!) 97.5 F (36.4 C) Oral 89 18 94 %  06/23/23 1304 131/60 99.4 F (37.4 C) Oral 88 16 97 %     Recent laboratory studies:  Recent Labs    06/22/23 0405  WBC 13.9*  HGB 11.5*  HCT 35.6*  PLT 188  NA 139  K 3.7  CL 101  CO2 28  BUN 16  CREATININE 0.65  GLUCOSE 139*  CALCIUM 8.2*     Discharge Medications:   Allergies as of 06/24/2023       Reactions   Amlodipine Swelling    leg swelling  Diltiazem Swelling   Bilateral feet   Methotrexate Swelling    swelling in ankles   Metronidazole Swelling    leg swelling   Other Other (See Comments)   Medication:Methychloroisothlazolinone/methylisothiazilinoneCL +Me-Isothiazolinone Reaction: unknown-patch testing Medication:glyceryl thioglycolate  Reaction:unknown-patch testing   Quaternium-15 Other (See Comments)   unknown reaction-patching testing        Medication List     TAKE these medications    clobetasol cream 0.05 % Commonly known as: TEMOVATE Apply 1 Application topically daily as needed (itching).   colchicine 0.6 MG tablet Take 1 tablet (0.6 mg total) by mouth daily as needed. If the patient experiences a gout flare-up, can give two tablets that day and then one tablet daily for 2 weeks.   Creon 36000-114000 units Cpep capsule Generic drug:  lipase/protease/amylase Take 2 capsules with each meal. Take 1 capsule with each snack (up to 2 snacks) daily.   Eliquis 5 MG Tabs tablet Generic drug: apixaban TAKE ONE TABLET BY MOUTH TWICE DAILY   furosemide 40 MG tablet Commonly known as: LASIX Take 2 tablets (80 mg total) by mouth daily.   gabapentin 300 MG capsule Commonly known as: NEURONTIN Take 300 mg by mouth daily as needed (itching / pain).   hydroxychloroquine 200 MG tablet Commonly known as: PLAQUENIL Take 200 mg by mouth 2 (two) times daily.   methocarbamol 500 MG tablet Commonly known as: ROBAXIN Take 1 tablet (500 mg total) by mouth every 6 (six) hours as needed for muscle spasms.   multivitamin with minerals Tabs tablet Take 1 tablet by mouth in the morning. One-A-Day for Women 50+   oxyCODONE 5 MG immediate release tablet Commonly known as: Oxy IR/ROXICODONE Take 1-2 tablets (5-10 mg total) by mouth every 4 (four) hours as needed for moderate pain (pain score 4-6) (pain score 4-6).   potassium chloride 10 MEQ tablet Commonly known as: KLOR-CON Take 2 tablets (20 mEq total) by mouth 2 (two) times daily.   Systane Balance 0.6 % Soln Generic drug: Propylene Glycol Place 1 drop into both eyes at bedtime.   Vitamin D 50 MCG (2000 UT) Caps Take 2,000-4,000 Units by mouth See admin instructions. Take 4000 units in the morning and 2000 units in the evening               Durable Medical Equipment  (From admission, onward)           Start     Ordered   06/21/23 1221  DME 3 n 1  Once        06/21/23 1220   06/21/23 1221  DME Walker rolling  Once       Question Answer Comment  Walker: With 5 Inch Wheels   Patient needs a walker to treat with the following condition Status post total left knee replacement      06/21/23 1220            Diagnostic Studies: DG Knee Left Port Result Date: 06/21/2023 CLINICAL DATA:  [id number] Status post total left knee replacement [id number] EXAM: PORTABLE LEFT  KNEE - 1-2 VIEW COMPARISON:  None Available. FINDINGS: Left knee arthroplasty in expected alignment. No periprosthetic lucency or fracture. There has been patellar resurfacing. Recent postsurgical change includes air and edema in the soft tissues and joint space. Anterior skin staples. IMPRESSION: Left knee arthroplasty without immediate postoperative complication. Electronically Signed   By: Narda Rutherford M.D.   On: 06/21/2023 12:54    Disposition: Discharge disposition: 03-Skilled Nursing Facility  Follow-up Information     Kathryne Hitch, MD Follow up in 2 week(s).   Specialty: Orthopedic Surgery Contact information: [address] [place] [address] [phone]                  Signed: Kathryne Hitch 06/24/2023, 7:29 AM

## 2023-06-24 NOTE — NC FL2 (Signed)
 [place] MEDICAID FL2 LEVEL OF CARE FORM     IDENTIFICATION  Patient Name: Lindsay Clark Birthdate: 11-29-1940 Sex: female Admission Date (Current Location): 06/21/2023  [facility] and [place] Number:  Producer, television/film/video and Address:  [facility],  [address] [place], [place] [address]      Provider Number: [id number]  Attending Physician Name and Address:  Kathryne Hitch  Relative Name and Phone Number:  Barkley Boards @ [phone]    Current Level of Care: Hospital Recommended Level of Care: Skilled Nursing Facility Prior Approval Number:    Date Approved/Denied:   PASRR Number:    Discharge Plan: SNF    Current Diagnoses: Patient Active Problem List   Diagnosis Date Noted   Status post total left knee replacement 06/21/2023   Vulvovaginal candidiasis 03/28/2023   Vaginal atrophy 03/28/2023   Vaginal odor 03/28/2023   Postmenopausal estrogen deficiency 03/28/2023   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/24/2022   Acquired genu valgum 10/05/2021   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   Left knee pain 08/09/2020   Persistent atrial fibrillation (HCC) 04/05/2020   Secondary hypercoagulable state (HCC) 04/05/2020   Primary osteoarthritis of left knee 09/02/2019   Prolapsed internal hemorrhoids, grade 2 03/03/2019   Brain lesion 06/24/2017   Osteoarthritis    Multiple lesions on CT of brain and spine    Movement disorder    Lumbar spondylosis    Hyperlipidemia    DDD (degenerative disc disease), lumbar    Dermatitis    Cervical spondylosis    Cervical disc disease    Diastolic dysfunction 07/11/2016   Syncope 04/18/2016   Anxiety 04/18/2016   Hypertension    Autoimmune disorder-autoimmune encephalitis    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   Coronary artery calcification of native artery 11/04/2014   Abnormal finding on MRI of brain 10/04/2014   Pruritus 11/30/2011   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     Orientation RESPIRATION BLADDER Height & Weight     Self, Time, Situation, Place  Normal Continent Weight: 207 lb (93.9 kg) Height:  5\' 4"  (162.6 cm)  BEHAVIORAL SYMPTOMS/MOOD NEUROLOGICAL BOWEL NUTRITION STATUS      Continent Diet  AMBULATORY STATUS COMMUNICATION OF NEEDS Skin   Limited Assist Verbally Other (Comment) (surgical incision only)                       Personal Care Assistance Level of Assistance  Bathing, Dressing Bathing Assistance: Limited assistance   Dressing Assistance: Limited assistance     Functional Limitations Info  Sight, Hearing, Speech Sight Info: Adequate Hearing Info: Adequate Speech Info: Adequate    SPECIAL CARE FACTORS FREQUENCY  PT (By licensed PT), OT (By licensed OT)     PT Frequency: 5x/wk OT Frequency: 5x/wk            Contractures Contractures Info: Not present    Additional Factors Info  Code Status Code Status Info: Full             Current Medications (06/24/2023):  This is the current hospital active medication list Current Facility-Administered Medications  Medication Dose Route Frequency Provider Last Rate Last Admin   acetaminophen (TYLENOL) tablet 325-650 mg  325-650 mg Oral Q6H PRN Kathryne Hitch, MD   650 mg at 06/22/23 0930   alum & mag hydroxide-simeth (MAALOX/MYLANTA) 200-200-20 MG/5ML suspension 30 mL  30 mL Oral Q4H PRN Kathryne Hitch, MD       apixaban Everlene Balls) tablet  5 mg  5 mg Oral BID Kathryne Hitch, MD   5 mg at 06/23/23 2056   cholecalciferol (VITAMIN D3) 25 MCG (1000 UNIT) tablet 2,000 Units  2,000 Units Oral QHS Kathryne Hitch, MD   2,000 Units at 06/23/23 2056   cholecalciferol (VITAMIN D3) 25 MCG (1000 UNIT) tablet 4,000 Units  4,000 Units Oral Daily Kathryne Hitch, MD   4,000 Units at 06/23/23 2841   diphenhydrAMINE (BENADRYL) 12.5 MG/5ML elixir 12.5-25 mg  12.5-25 mg Oral Q4H PRN Kathryne Hitch, MD       docusate sodium (COLACE)  capsule 100 mg  100 mg Oral BID Kathryne Hitch, MD   100 mg at 06/23/23 2056   furosemide (LASIX) tablet 80 mg  80 mg Oral Daily Kathryne Hitch, MD   80 mg at 06/23/23 3244   HYDROmorphone (DILAUDID) injection 0.5-1 mg  0.5-1 mg Intravenous Q4H PRN Kathryne Hitch, MD       hydroxychloroquine (PLAQUENIL) tablet 200 mg  200 mg Oral BID Eldred Manges, MD   200 mg at 06/23/23 2056   menthol-cetylpyridinium (CEPACOL) lozenge 3 mg  1 lozenge Oral PRN Kathryne Hitch, MD       Or   phenol (CHLORASEPTIC) mouth spray 1 spray  1 spray Mouth/Throat PRN Kathryne Hitch, MD       methocarbamol (ROBAXIN) tablet 500 mg  500 mg Oral Q6H PRN Kathryne Hitch, MD   500 mg at 06/23/23 2056   Or   methocarbamol (ROBAXIN) injection 500 mg  500 mg Intravenous Q6H PRN Kathryne Hitch, MD       metoCLOPramide (REGLAN) tablet 5-10 mg  5-10 mg Oral Q8H PRN Kathryne Hitch, MD   5 mg at 06/22/23 1559   Or   metoCLOPramide (REGLAN) injection 5-10 mg  5-10 mg Intravenous Q8H PRN Kathryne Hitch, MD       ondansetron [facility]) tablet 4 mg  4 mg Oral Q6H PRN Kathryne Hitch, MD   4 mg at 06/22/23 1445   Or   ondansetron (ZOFRAN) injection 4 mg  4 mg Intravenous Q6H PRN Kathryne Hitch, MD       oxyCODONE (Oxy IR/ROXICODONE) immediate release tablet 10-15 mg  10-15 mg Oral Q4H PRN Kathryne Hitch, MD   10 mg at 06/23/23 1714   oxyCODONE (Oxy IR/ROXICODONE) immediate release tablet 5-10 mg  5-10 mg Oral Q4H PRN Kathryne Hitch, MD   10 mg at 06/23/23 2056   pantoprazole (PROTONIX) EC tablet 40 mg  40 mg Oral Daily Kathryne Hitch, MD   40 mg at 06/23/23 0102   potassium chloride SA (KLOR-CON M) CR tablet 20 mEq  20 mEq Oral BID Kathryne Hitch, MD   20 mEq at 06/23/23 2056     Discharge Medications: Please see discharge summary for a list of discharge medications.  Relevant Imaging Results:  Relevant  Lab Results:   Additional Information SSN: [id number]  Amada Jupiter, LCSW

## 2023-06-24 NOTE — Plan of Care (Signed)
   Problem: Coping: Goal: Level of anxiety will decrease Outcome: Progressing   Problem: Pain Managment: Goal: General experience of comfort will improve and/or be controlled Outcome: Progressing   Problem: Safety: Goal: Ability to remain free from injury will improve Outcome: Progressing

## 2023-06-24 NOTE — TOC Transition Note (Signed)
 Transition of Care [facility]) - Discharge Note   Patient Details  Name: Lindsay Clark MRN: [id number] Date of Birth: [birth year]  Transition of Care [facility]) CM/SW Contact:  Amada Jupiter, LCSW Phone Number: 06/24/2023, 10:19 AM   Clinical Narrative:     Met with pt this morning and confirming plan for her to dc to rehab unit at [facility].  Confirmed with [facility] that bed is ready for admit today and MD has medically cleared for dc.  PTAR called at 10:15am.  RN to call report to [phone].  No further TOC needs.  Final next level of care: Skilled Nursing Facility Barriers to Discharge: No Barriers Identified   Patient Goals and CMS Choice Patient states their goals for this hospitalization and ongoing recovery are:: [place] after [place]hab          Discharge Placement                Patient to be transferred to facility by: PTAR Name of family member notified: pt notifying spouse    Discharge Plan and Services Additional resources added to the After Visit Summary for                  DME Arranged: N/A DME Agency: NA                  Social Drivers of Health (SDOH) Interventions SDOH Screenings   Food Insecurity: No Food Insecurity (06/21/2023)  Housing: Low Risk  (06/21/2023)  Transportation Needs: No Transportation Needs (06/21/2023)  Utilities: Not At Risk (06/21/2023)  Depression (PHQ2-9): Low Risk  (12/27/2022)  Social Connections: Patient Declined (06/21/2023)  Tobacco Use: Low Risk  (06/21/2023)  Health Literacy: Low Risk  (07/29/2020)   Received from [facility]     Readmission Risk Interventions    06/24/2023   10:18 AM  Readmission Risk Prevention Plan  Post Dischage Appt Complete  Medication Screening Complete  Transportation Screening Complete

## 2023-06-24 NOTE — Progress Notes (Signed)
 Patient ID: Lindsay Clark, female   DOB: April 05, 1941, 83 y.o.   MRN: [id number] The patient is awake and alert.  Her vital signs are stable.  Her left operative knee is stable.  She can be discharged to [facility] today to their skilled nursing section for continued rehab.

## 2023-06-24 NOTE — Telephone Encounter (Signed)
 Patient called triage this morning stating Dr Magnus Ivan came by to see her in the hospital to change her bandage. She would like for him to call her or come back by. She said she believes she has gout in her right knee and left heel and cannot WTB at all and she stated she is in bad shape.  [phone]

## 2023-06-24 NOTE — Progress Notes (Signed)
 Physical Therapy Treatment Patient Details Name: Lindsay Clark MRN: [id number] DOB: [birth year] Today's Date: 06/24/2023   History of Present Illness Pt s/p L TKR on 06/21/23 and with hs of R TKR, Lupus, DDD and a-fib    PT Comments  Pt with decline in mobility today due to new L foot pain and unable to weight bear.  Reports pain developed suddenly this morning - she feels it is gout.  Pt with edema in L foot , no erythema, pain with palpation over cuboid, lateral cuneiform, base of 4th metatarsal - MD is aware per notes in chart.  She required mod A to stand but unable to take any steps.  Will cont POC from therapy perspective.  Noting plan was for d/c today to [facility], [place].   If plan is discharge home, recommend the following: A little help with walking and/or transfers;A little help with bathing/dressing/bathroom;Assistance with cooking/housework;Assist for transportation;Help with stairs or ramp for entrance   Can travel by private vehicle        Equipment Recommendations  None recommended by PT    Recommendations for Other Services       Precautions / Restrictions Precautions Precautions: Knee;Fall Required Braces or Orthoses: Knee Immobilizer - Left Knee Immobilizer - Left: Discontinue once straight leg raise with < 10 degree lag Restrictions LLE Weight Bearing Per Provider Order: Weight bearing as tolerated     Mobility  Bed Mobility Overal bed mobility: Needs Assistance Bed Mobility: Supine to Sit, Sit to Supine     Supine to sit: Mod assist Sit to supine: Mod assist   General bed mobility comments: Increased time with cues for sequencing.  REquriign assist for bil LE and scooting    Transfers Overall transfer level: Needs assistance Equipment used: Rolling walker (2 wheels) Transfers: Sit to/from Stand Sit to Stand: Mod assist, From elevated surface           General transfer comment: Performed STS x 2 from elevated bed.  Cues for hand placement and  mod A to rise    Ambulation/Gait               General Gait Details: Not able to take any steps due to pain in L foot   Stairs             Wheelchair Mobility     Tilt Bed    Modified Rankin (Stroke Patients Only)       Balance Overall balance assessment: Needs assistance Sitting-balance support: No upper extremity supported, Feet supported Sitting balance-Leahy Scale: Good     Standing balance support: No upper extremity supported, Bilateral upper extremity supported Standing balance-Leahy Scale: Fair Standing balance comment: Could static stand balanced on R LE without UE support                            Communication    Cognition Arousal: Alert Behavior During Therapy: WFL for tasks assessed/performed   PT - Cognitive impairments: No apparent impairments                                Cueing    Exercises Total Joint Exercises Knee Flexion: AROM, Right, AAROM, Left, 5 reps, Seated, Limitations Knee Flexion Limitations: only tolerating 5 reps on L; no issues on R Goniometric ROM: L knee ROM ~5 degrees to 50 General Exercises - Lower Extremity Ankle Circles/Pumps: AROM,  Both, 10 reps, Seated Long Arc Quad: AROM, Right, AAROM, Left, 10 reps, Seated    General Comments General comments (skin integrity, edema, etc.): Pt reports R knee has been sore for a couple of days but has increased today, did note some light ecchymosis on R knee. A ble to weight bear on R and felt better after ROM.  L knee pain similar to yesterday. She reports bigger concern is pain in L heel that developed suddenly this morning and she is not able to weight bear on that leg. She is convinced it is gout - has had in past. Did not note any erythema in L foot and heel is not boggy. L foot is very edematous throughout. With palpation , pt very tender base of 4th metatarsal, cuboid, lateral cuneiform. She was not able to put any weight on L side in standing due to  foot pain. NOted in chart that pt has called MD office and informed them of her new concerns. After session, noted Dr. Magnus Ivan aware and plans to f/u with pt.      Pertinent Vitals/Pain Pain Assessment Pain Assessment: 0-10 Pain Score: 8  Pain Location: L heel/foot with weight bearing Pain Descriptors / Indicators: Sharp Pain Intervention(s): Limited activity within patient's tolerance, Monitored during session, Premedicated before session, Repositioned (pt reports feels like gout; MD aware)    Home Living                          Prior Function            PT Goals (current goals can now be found in the care plan section) Progress towards PT goals: Not progressing toward goals - comment (new foot pain)    Frequency    7X/week      PT Plan      Co-evaluation              AM-PAC PT "6 Clicks" Mobility   Outcome Measure  Help needed turning from your back to your side while in a flat bed without using bedrails?: A Little Help needed moving from lying on your back to sitting on the side of a flat bed without using bedrails?: A Lot Help needed moving to and from a bed to a chair (including a wheelchair)?: A Lot Help needed standing up from a chair using your arms (e.g., wheelchair or bedside chair)?: A Lot Help needed to walk in hospital room?: Total Help needed climbing 3-5 steps with a railing? : Total 6 Click Score: 11    End of Session Equipment Utilized During Treatment: Gait belt Activity Tolerance: Patient limited by pain Patient left: in bed;with call bell/phone within reach;with bed alarm set;with family/visitor present Nurse Communication: Mobility status PT Visit Diagnosis: Difficulty in walking, not elsewhere classified (R26.2)     Time: 1021-1100 PT Time Calculation (min) (ACUTE ONLY): 39 min  Charges:    $Therapeutic Exercise: 8-22 mins $Therapeutic Activity: 23-37 mins PT General Charges $$ ACUTE PT VISIT: 1 Visit                      Anise Salvo, PT Acute Rehab Services [facility] [phone]    Rayetta Humphrey 06/24/2023, 12:06 PM

## 2023-06-24 NOTE — Progress Notes (Signed)
 Patient ID: Lindsay Clark, female   DOB: 31-Dec-1940, 83 y.o.   MRN: [id number] I will come see the patient later in between my morning and afternoon clinic.  I have ordered a dose of Toradol which will be a one-time dose to see if this will help.  I will also order a one-time dose of colchicine.

## 2023-06-25 ENCOUNTER — Non-Acute Institutional Stay (SKILLED_NURSING_FACILITY): Payer: Self-pay | Admitting: Orthopedic Surgery

## 2023-06-25 DIAGNOSIS — Z96652 Presence of left artificial knee joint: Secondary | ICD-10-CM

## 2023-06-25 DIAGNOSIS — M109 Gout, unspecified: Secondary | ICD-10-CM

## 2023-06-25 DIAGNOSIS — M25562 Pain in left knee: Secondary | ICD-10-CM | POA: Diagnosis not present

## 2023-06-25 DIAGNOSIS — I10 Essential (primary) hypertension: Secondary | ICD-10-CM

## 2023-06-25 DIAGNOSIS — M62562 Muscle wasting and atrophy, not elsewhere classified, left lower leg: Secondary | ICD-10-CM | POA: Diagnosis not present

## 2023-06-25 DIAGNOSIS — I4819 Other persistent atrial fibrillation: Secondary | ICD-10-CM

## 2023-06-25 DIAGNOSIS — L932 Other local lupus erythematosus: Secondary | ICD-10-CM

## 2023-06-25 DIAGNOSIS — K5903 Drug induced constipation: Secondary | ICD-10-CM

## 2023-06-25 DIAGNOSIS — Z96651 Presence of right artificial knee joint: Secondary | ICD-10-CM | POA: Diagnosis not present

## 2023-06-25 DIAGNOSIS — E782 Mixed hyperlipidemia: Secondary | ICD-10-CM

## 2023-06-25 DIAGNOSIS — R2689 Other abnormalities of gait and mobility: Secondary | ICD-10-CM | POA: Diagnosis not present

## 2023-06-25 DIAGNOSIS — M1732 Unilateral post-traumatic osteoarthritis, left knee: Secondary | ICD-10-CM | POA: Diagnosis not present

## 2023-06-25 MED ORDER — OXYCODONE HCL 5 MG PO TABS: 5.0000 mg | ORAL_TABLET | ORAL | 0 refills | Status: DC | PRN

## 2023-06-26 ENCOUNTER — Encounter: Payer: Self-pay | Admitting: Orthopedic Surgery

## 2023-06-26 DIAGNOSIS — M62562 Muscle wasting and atrophy, not elsewhere classified, left lower leg: Secondary | ICD-10-CM | POA: Diagnosis not present

## 2023-06-26 DIAGNOSIS — M25562 Pain in left knee: Secondary | ICD-10-CM | POA: Diagnosis not present

## 2023-06-26 DIAGNOSIS — Z96651 Presence of right artificial knee joint: Secondary | ICD-10-CM | POA: Diagnosis not present

## 2023-06-26 DIAGNOSIS — M1732 Unilateral post-traumatic osteoarthritis, left knee: Secondary | ICD-10-CM | POA: Diagnosis not present

## 2023-06-26 DIAGNOSIS — R278 Other lack of coordination: Secondary | ICD-10-CM | POA: Diagnosis not present

## 2023-06-26 DIAGNOSIS — M6281 Muscle weakness (generalized): Secondary | ICD-10-CM | POA: Diagnosis not present

## 2023-06-26 DIAGNOSIS — R2689 Other abnormalities of gait and mobility: Secondary | ICD-10-CM | POA: Diagnosis not present

## 2023-06-26 NOTE — Progress Notes (Signed)
 Location:  [facility] Room Number: 159 Place of Service:  SNF ([phone]) Provider:  Octavia Heir, NP   Laurann Montana, MD  Patient Care Team: Laurann Montana, MD as PCP - General (Family Medicine) [name], MD as PCP - Cardiology (Cardiology) Regan Lemming, MD as PCP - Electrophysiology (Cardiology) Drema Dallas, DO as Consulting Physician (Neurology) Shirlean Kelly, MD as Consulting Physician (Neurosurgery)  Extended Emergency Contact Information Primary Emergency Contact: Pritt,Stephen Address: [address]          [place], [place] [address] [name] of [place] Home Phone: [phone] Mobile Phone: [phone] Relation: Spouse  Code Status:  Full code Goals of care: Advanced Directive information    06/26/2023   10:30 AM  Advanced Directives  Does Patient Have a Medical Advance Directive? No  Does patient want to make changes to medical advance directive? No - Patient declined     Chief Complaint  Patient presents with   Hospitalization Follow-up    HPI:  Pt is a 83 y.o. female seen today for f/u s/p hospitalization 02/28-03/03 at [facility] hospital due to elective knee surgery.   She currently resides on the skilled nursing unit at [facility]. PMH: HTN, atrial fibrillation, HLD, diastolic dysfunction, PAF, DDD, OA, auto immune disorder, h/o RTKA, and anxiety.   02/28 she underwent elective left knee replacement by Dr. Magnus Ivan. Anesthesia included local block and spinal anesthesia. EBL 50 cc. She tolerated procedure well and was transferred to orthopedics unit. She was transitioned to oxycodone for pain. Eliquis was also resumed. WBAT. Ambulating moderate assistance with nursing. 03/03 transferred to [facility] for PT/OT services.   Today she reports increased constipation. She has not had a BM since surgery. She is also having increased pain to left ankle. She reports developing gout attacks when her right knee was  replaced. She was prescribed colchicine prn by surgeon. Pain is tolerated with oxycodone. WBAT. She has started PT. Appetite fair. Denies chest pain, shortness of breat and calf pain at this time. Surgical dressing to be changes at follow up appointment with surgeon. Afebrile. Vitals stable.    Past Medical History:  Diagnosis Date   Atherosclerosis    mild cerebral   Atrial fibrillation and flutter (HCC)    Autoimmune disorder (HCC)    Autoimmune encephalitis    Cervical disc disease    with spondylosis   Cervical spondylosis    Chronic dermatitis    eczematous   Colitis    COVID 10/2020   DDD (degenerative disc disease), lumbar    Frozen shoulder    left   Hyperlipidemia    Hypertension    IBS (irritable bowel syndrome)    with diarrhea Pt stated she never had this   Lateral epicondylitis    Lumbar spondylosis    Lupus    cutaneous   Movement disorder    Multiple lesions on CT of brain and spine    reports lesions from previous CT scan at [facility]    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   PONV (postoperative nausea and vomiting)    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   Torn meniscus    left   Past Surgical History:  Procedure Laterality Date   A FLUTTER ABLATION  08/2020   [facility] and had cardioversion at the same time   CATARACT EXTRACTION     COLONOSCOPY     EYE SURGERY Left    HEMORRHOID BANDING     KNEE  ARTHROSCOPY Left 08/09/2020   Procedure: LEFT KNEE ARTHROSCOPY MEDIAL AND LATERAL MENISECTONMY AND CONDROPLASTY;  Surgeon: Marcene Corning, MD;  Location: [facility] ORS;  Service: Orthopedics;  Laterality: Left;   LOOP RECORDER INSERTION N/A 07/05/2017   Procedure: LOOP RECORDER INSERTION;  Surgeon: Hillis Range, MD;  Location: [facility] INVASIVE CV LAB;  Service: Cardiovascular;  Laterality: N/A;   TOTAL KNEE ARTHROPLASTY Right 04/24/2022   Procedure: RIGHT TOTAL KNEE ARTHROPLASTY;  Surgeon: Kathryne Hitch, MD;  Location: [facility] OR;  Service: Orthopedics;   Laterality: Right;   TOTAL KNEE ARTHROPLASTY Left 06/21/2023   Procedure: LEFT TOTAL KNEE ARTHROPLASTY;  Surgeon: Kathryne Hitch, MD;  Location: [facility] ORS;  Service: Orthopedics;  Laterality: Left;   TUBAL LIGATION  1980   WISDOM TOOTH EXTRACTION      Allergies  Allergen Reactions   Amlodipine Swelling     leg swelling    Diltiazem Swelling    Bilateral feet   Methotrexate Swelling     swelling in ankles    Metronidazole Swelling     leg swelling   Other Other (See Comments)    Medication:Methychloroisothlazolinone/methylisothiazilinoneCL +Me-Isothiazolinone Reaction: unknown-patch testing  Medication:glyceryl thioglycolate  Reaction:unknown-patch testing   Quaternium-15 Other (See Comments)    unknown reaction-patching testing    Outpatient Encounter Medications as of 06/25/2023  Medication Sig   Cholecalciferol (VITAMIN D) 50 MCG (2000 UT) CAPS Take 2,000-4,000 Units by mouth See admin instructions. Take 4000 units in the morning and 2000 units in the evening   clobetasol cream (TEMOVATE) 0.05 % Apply 1 Application topically daily as needed (itching).   colchicine 0.6 MG tablet Take 1 tablet (0.6 mg total) by mouth daily as needed. If the patient experiences a gout flare-up, can give two tablets that day and then one tablet daily for 2 weeks.   ELIQUIS 5 MG TABS tablet TAKE ONE TABLET BY MOUTH TWICE DAILY   furosemide (LASIX) 40 MG tablet Take 2 tablets (80 mg total) by mouth daily.   gabapentin (NEURONTIN) 300 MG capsule Take 300 mg by mouth daily as needed (itching / pain).   hydroxychloroquine (PLAQUENIL) 200 MG tablet Take 200 mg by mouth 2 (two) times daily.   lipase/protease/amylase (CREON) 36000 UNITS CPEP capsule Take 2 capsules with each meal. Take 1 capsule with each snack (up to 2 snacks) daily.   methocarbamol (ROBAXIN) 500 MG tablet Take 1 tablet (500 mg total) by mouth every 6 (six) hours as needed for muscle spasms.   Multiple Vitamin (MULTIVITAMIN WITH  MINERALS) TABS tablet Take 1 tablet by mouth in the morning. One-A-Day for Women 50+   oxyCODONE (OXY IR/ROXICODONE) 5 MG immediate release tablet Take 1-2 tablets (5-10 mg total) by mouth every 4 (four) hours as needed for moderate pain (pain score 4-6) (pain score 4-6).   OXYCODONE-ACETAMINOPHEN PO Take 5 mg by mouth every 4 (four) hours as needed for pain.   potassium chloride (KLOR-CON) 10 MEQ tablet Take 2 tablets (20 mEq total) by mouth 2 (two) times daily.   Propylene Glycol (SYSTANE BALANCE) 0.6 % SOLN Place 1 drop into both eyes at bedtime.   sennosides-docusate sodium (SENOKOT-S) 8.6-50 MG tablet Take 2 tablets by mouth 2 (two) times daily.   No facility-administered encounter medications on file as of 06/25/2023.    Review of Systems  Constitutional:  Negative for activity change, appetite change, fatigue and fever.  HENT:  Negative for sore throat and trouble swallowing.   Eyes:  Negative for visual disturbance.  Respiratory:  Negative  for cough, shortness of breath and wheezing.   Cardiovascular:  Negative for chest pain and leg swelling.  Gastrointestinal:  Positive for constipation. Negative for abdominal pain, nausea and vomiting.  Genitourinary:  Negative for dysuria.  Musculoskeletal:  Positive for arthralgias and gait problem.  Skin:  Positive for wound.  Neurological:  Positive for weakness. Negative for dizziness and light-headedness.  Psychiatric/Behavioral:  Negative for dysphoric mood. The patient is not nervous/anxious.     Immunization History  Administered Date(s) Administered   Moderna Sars-Covid-2 Vaccination 05/07/2019, 06/04/2019, 02/15/2020   Pneumococcal Conjugate-13 08/23/2017   Pneumococcal Polysaccharide-23 09/25/2018   Td 06/08/2005   Tdap 07/17/2012   Zoster Recombinant(Shingrix) 05/08/2017   Zoster, Live 06/04/2005, 11/19/2016, 05/08/2017   Pertinent  Health Maintenance Due  Topic Date Due   Colonoscopy  03/01/2021   INFLUENZA VACCINE  Never  done   DEXA SCAN  Completed      04/25/2022    9:50 PM 04/26/2022    8:00 AM 04/27/2022    4:00 AM 04/27/2022    7:29 AM 12/27/2022   11:12 AM  Fall Risk  Falls in the past year?     0  Was there an injury with Fall?     0  Fall Risk Category Calculator     0  (RETIRED) Patient Fall Risk Level Moderate fall risk Moderate fall risk High fall risk High fall risk   Patient at Risk for Falls Due to     No Fall Risks  Fall risk Follow up     Falls evaluation completed   Functional Status Survey:    Vitals:   06/26/23 1025 06/26/23 1029  BP: (!) 145/82 138/72  Pulse: 87   Resp: 12   Temp: 98.3 F (36.8 C)   SpO2: 94%   Weight: 206 lb 6.4 oz (93.6 kg)   Height: 5\' 4"  (1.626 m)    Body mass index is 35.43 kg/m. Physical Exam Vitals reviewed.  Constitutional:      General: She is not in acute distress. HENT:     Head: Normocephalic.  Eyes:     General:        Right eye: No discharge.        Left eye: No discharge.  Cardiovascular:     Rate and Rhythm: Normal rate and regular rhythm.     Pulses: Normal pulses.     Heart sounds: Normal heart sounds.  Pulmonary:     Effort: Pulmonary effort is normal. No respiratory distress.     Breath sounds: Normal breath sounds. No wheezing.  Abdominal:     General: Bowel sounds are normal.     Palpations: Abdomen is soft.  Musculoskeletal:     Cervical back: Neck supple.     Right lower leg: No edema.     Left lower leg: No edema.  Skin:    General: Skin is warm.     Capillary Refill: Capillary refill takes less than 2 seconds.     Comments: Left knee surgical incision, CDI  Neurological:     General: No focal deficit present.     Mental Status: She is alert and oriented to person, place, and time.     Motor: No weakness.     Gait: Gait abnormal.  Psychiatric:        Mood and Affect: Mood normal.     Labs reviewed: Recent Labs    06/11/23 1159 06/22/23 0405  NA 142 139  K 3.3* 3.7  CL  99 101  CO2 30 28  GLUCOSE 114*  139*  BUN 16 16  CREATININE 0.46 0.65  CALCIUM 8.9 8.2*   No results for input(s): "AST", "ALT", "ALKPHOS", "BILITOT", "PROT", "ALBUMIN" in the last 8760 hours. Recent Labs    06/11/23 1159 06/22/23 0405  WBC 6.5 13.9*  HGB 13.4 11.5*  HCT 41.6 35.6*  MCV 93.9 95.7  PLT 218 188   No results found for: "TSH" No results found for: "HGBA1C" No results found for: "CHOL", "HDL", "LDLCALC", "LDLDIRECT", "TRIG", "CHOLHDL"  Significant Diagnostic Results in last 30 days:  DG Knee Left Port Result Date: 06/21/2023 CLINICAL DATA:  [id number] Status post total left knee replacement [id number] EXAM: PORTABLE LEFT KNEE - 1-2 VIEW COMPARISON:  None Available. FINDINGS: Left knee arthroplasty in expected alignment. No periprosthetic lucency or fracture. There has been patellar resurfacing. Recent postsurgical change includes air and edema in the soft tissues and joint space. Anterior skin staples. IMPRESSION: Left knee arthroplasty without immediate postoperative complication. Electronically Signed   By: Narda Rutherford M.D.   On: 06/21/2023 12:54    Assessment/Plan 1. S/P total knee arthroplasty, left (Primary) - 02/28 elective LTKA - EBL < 50cc - WBAT - Eliquis resumed - cont oxycodone prn for pain  2. Primary hypertension - controlled - on furosemide  3. Mixed hyperlipidemia - not on medication  4. 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 Persistent atrial fibrillation (HCC) - HR< 100 without medication - cont Eliquis  7. Acute gout of left ankle, unspecified cause - noted to left ankle - cont colchicine prn    Family/ staff Communication: plan discussed with patient and nurse  Labs/tests ordered:  none

## 2023-06-26 NOTE — Progress Notes (Signed)
 This encounter was created in error - please disregard.  This encounter was created in error - please disregard.

## 2023-06-27 DIAGNOSIS — R2689 Other abnormalities of gait and mobility: Secondary | ICD-10-CM | POA: Diagnosis not present

## 2023-06-27 DIAGNOSIS — Z96651 Presence of right artificial knee joint: Secondary | ICD-10-CM | POA: Diagnosis not present

## 2023-06-27 DIAGNOSIS — M25562 Pain in left knee: Secondary | ICD-10-CM | POA: Diagnosis not present

## 2023-06-27 DIAGNOSIS — R278 Other lack of coordination: Secondary | ICD-10-CM | POA: Diagnosis not present

## 2023-06-27 DIAGNOSIS — M1732 Unilateral post-traumatic osteoarthritis, left knee: Secondary | ICD-10-CM | POA: Diagnosis not present

## 2023-06-27 DIAGNOSIS — M62562 Muscle wasting and atrophy, not elsewhere classified, left lower leg: Secondary | ICD-10-CM | POA: Diagnosis not present

## 2023-06-27 DIAGNOSIS — M6281 Muscle weakness (generalized): Secondary | ICD-10-CM | POA: Diagnosis not present

## 2023-06-28 DIAGNOSIS — R2689 Other abnormalities of gait and mobility: Secondary | ICD-10-CM | POA: Diagnosis not present

## 2023-06-28 DIAGNOSIS — R278 Other lack of coordination: Secondary | ICD-10-CM | POA: Diagnosis not present

## 2023-06-28 DIAGNOSIS — M1732 Unilateral post-traumatic osteoarthritis, left knee: Secondary | ICD-10-CM | POA: Diagnosis not present

## 2023-06-28 DIAGNOSIS — M6281 Muscle weakness (generalized): Secondary | ICD-10-CM | POA: Diagnosis not present

## 2023-06-28 DIAGNOSIS — M25562 Pain in left knee: Secondary | ICD-10-CM | POA: Diagnosis not present

## 2023-06-28 DIAGNOSIS — Z96651 Presence of right artificial knee joint: Secondary | ICD-10-CM | POA: Diagnosis not present

## 2023-06-28 DIAGNOSIS — M62562 Muscle wasting and atrophy, not elsewhere classified, left lower leg: Secondary | ICD-10-CM | POA: Diagnosis not present

## 2023-07-01 ENCOUNTER — Non-Acute Institutional Stay (SKILLED_NURSING_FACILITY): Payer: Self-pay | Admitting: Internal Medicine

## 2023-07-01 ENCOUNTER — Encounter: Payer: Self-pay | Admitting: Internal Medicine

## 2023-07-01 DIAGNOSIS — K5903 Drug induced constipation: Secondary | ICD-10-CM | POA: Diagnosis not present

## 2023-07-01 DIAGNOSIS — I4819 Other persistent atrial fibrillation: Secondary | ICD-10-CM

## 2023-07-01 DIAGNOSIS — Z96652 Presence of left artificial knee joint: Secondary | ICD-10-CM | POA: Diagnosis not present

## 2023-07-01 DIAGNOSIS — M6281 Muscle weakness (generalized): Secondary | ICD-10-CM | POA: Diagnosis not present

## 2023-07-01 DIAGNOSIS — I10 Essential (primary) hypertension: Secondary | ICD-10-CM

## 2023-07-01 DIAGNOSIS — M25562 Pain in left knee: Secondary | ICD-10-CM | POA: Diagnosis not present

## 2023-07-01 DIAGNOSIS — R2689 Other abnormalities of gait and mobility: Secondary | ICD-10-CM | POA: Diagnosis not present

## 2023-07-01 DIAGNOSIS — M1732 Unilateral post-traumatic osteoarthritis, left knee: Secondary | ICD-10-CM | POA: Diagnosis not present

## 2023-07-01 DIAGNOSIS — M62562 Muscle wasting and atrophy, not elsewhere classified, left lower leg: Secondary | ICD-10-CM | POA: Diagnosis not present

## 2023-07-01 DIAGNOSIS — E782 Mixed hyperlipidemia: Secondary | ICD-10-CM | POA: Diagnosis not present

## 2023-07-01 DIAGNOSIS — Z96651 Presence of right artificial knee joint: Secondary | ICD-10-CM | POA: Diagnosis not present

## 2023-07-01 DIAGNOSIS — R278 Other lack of coordination: Secondary | ICD-10-CM | POA: Diagnosis not present

## 2023-07-01 NOTE — Progress Notes (Signed)
 Provider:   Location:  Medical illustrator of Service:  SNF ([id number])  PCP: Laurann Montana, MD Patient Care Team: Laurann Montana, MD as PCP - General (Family Medicine) [name], MD as PCP - Cardiology (Cardiology) Regan Lemming, MD as PCP - Electrophysiology (Cardiology) Drema Dallas, DO as Consulting Physician (Neurology) Shirlean Kelly, MD as Consulting Physician (Neurosurgery)  Extended Emergency Contact Information Primary Emergency Contact: Lindsay Clark Address: [address]          [name], [place] [address] [name] Home Phone: [phone] Mobile Phone: [phone] Relation: Spouse  Code Status:  Goals of Care: Advanced Directive information    06/26/2023   10:30 AM  Advanced Directives  Does Patient Have a Medical Advance Directive? No  Does patient want to make changes to medical advance directive? No - Patient declined      Chief Complaint  Patient presents with   New Admit To SNF    HPI: Patient is a 83 y.o. female seen today for Readmission to SNF  [place] in [place]  Patient has a history of PAF  CAD, hypertension, lower extremity edema  S/p Right Knee replacement   Patient was electively admitted 02/28-03/03 for left knee replacement  She has been in rehab for past 1 week.  Patient is doing really well.  Able to do her ADLs.  Still needs some help with dressing.  Is not able to bend down.  Need help with her Her compression stockings.  Her pain seems to be controlled with only Tylenol.  She said that she had attack of gout immediately after surgery but it got resolved with colchicine Patient is insisting that she has to go home today.  She does not want to wait till Thursday to see Ortho Her husband she says can help  Past Medical History:  Diagnosis Date   Atherosclerosis    mild cerebral   Atrial fibrillation and flutter (HCC)    Autoimmune disorder (HCC)    Autoimmune encephalitis     Cervical disc disease    with spondylosis   Cervical spondylosis    Chronic dermatitis    eczematous   Colitis    COVID 10/2020   DDD (degenerative disc disease), lumbar    Frozen shoulder    left   Hyperlipidemia    Hypertension    IBS (irritable bowel syndrome)    with diarrhea Pt stated she never had this   Lateral epicondylitis    Lumbar spondylosis    Lupus    cutaneous   Movement disorder    Multiple lesions on CT of brain and spine    reports lesions from previous CT scan at [facility]    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   PONV (postoperative nausea and vomiting)    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   Torn meniscus    left   Past Surgical History:  Procedure Laterality Date   A FLUTTER ABLATION  08/2020   [facility] and had cardioversion at the same time   CATARACT EXTRACTION     COLONOSCOPY     EYE SURGERY Left    HEMORRHOID BANDING     KNEE ARTHROSCOPY Left 08/09/2020   Procedure: LEFT KNEE ARTHROSCOPY MEDIAL AND LATERAL MENISECTONMY AND CONDROPLASTY;  Surgeon: Marcene Corning, MD;  Location: [facility];  Service: Orthopedics;  Laterality: Left;   LOOP RECORDER INSERTION N/A 07/05/2017   Procedure: LOOP RECORDER INSERTION;  Surgeon: Hillis Range, MD;  [facility]: [facility];  Service: Cardiovascular;  Laterality: N/A;   TOTAL KNEE ARTHROPLASTY Right 04/24/2022   Procedure: RIGHT TOTAL KNEE ARTHROPLASTY;  Surgeon: Kathryne Hitch, MD;  Location: [facility];  Service: Orthopedics;  Laterality: Right;   TOTAL KNEE ARTHROPLASTY Left 06/21/2023   Procedure: LEFT TOTAL KNEE ARTHROPLASTY;  Surgeon: Kathryne Hitch, MD;  Location: [facility];  Service: Orthopedics;  Laterality: Left;   TUBAL LIGATION  1980   WISDOM TOOTH EXTRACTION      reports that she has never smoked. She has never used smokeless tobacco. She reports current alcohol use of about 4.0 standard drinks of alcohol per week. She reports that she does not use drugs. Social History    Socioeconomic History   Marital status: Married    Spouse name: Brett Canales   Number of children: 2   Years of education: MAx2   Highest education level: Not on file  Occupational History   Occupation: Retired    Associate Professor: OTHER  Tobacco Use   Smoking status: Never   Smokeless tobacco: Never  Vaping Use   Vaping status: Never Used  Substance and Sexual Activity   Alcohol use: Yes    Alcohol/week: 4.0 standard drinks of alcohol    Types: 4 Glasses of wine per week    Comment: 7 glasses of wine weekly   Drug use: No   Sexual activity: Not Currently  Other Topics Concern   Not on file  Social History Narrative   Patient lives at home with her spouse.   Caffeine Use: 2-3 cups daily   Social Drivers of Health   Financial Resource Strain: Not on file  Food Insecurity: No Food Insecurity (06/21/2023)   Hunger Vital Sign    Worried About Running Out of Food in the Last Year: Never true    Ran Out of Food in the Last Year: Never true  Transportation Needs: No Transportation Needs (06/21/2023)   PRAPARE - Administrator, Civil Service (Medical): No    Lack of Transportation (Non-Medical): No  Physical Activity: Not on file  Stress: Not on file  Social Connections: Patient Declined (06/21/2023)   Social Connection and Isolation Panel [NHANES]    Frequency of Communication with Friends and Family: Patient declined    Frequency of Social Gatherings with Friends and Family: Patient declined    Attends Religious Services: Patient declined    Database administrator or Organizations: Patient declined    Attends Banker Meetings: Patient declined    Marital Status: Patient declined  Intimate Partner Violence: Not At Risk (06/21/2023)   Humiliation, Afraid, Rape, and Kick questionnaire    Fear of Current or Ex-Partner: No    Emotionally Abused: No    Physically Abused: No    Sexually Abused: No    Functional Status Survey:    Family History  Problem Relation  Age of Onset   Cancer Mother 64       colon cancer   Stroke Mother    Colon cancer Mother 81   Heart attack Father 42   Emphysema Father    Cancer Maternal Grandmother 55       uterine   Thyroid disease Maternal Grandmother    Thrombosis Maternal Grandfather    Thyroid disease Son     Health Maintenance  Topic Date Due   Medicare Annual Wellness (AWV)  Never done   Zoster Vaccines- Shingrix (2 of 2) 07/03/2017   Colonoscopy  03/01/2021  DTaP/Tdap/Td (3 - Td or Tdap) 07/18/2022   INFLUENZA VACCINE  Never done   COVID-19 Vaccine (4 - 2024-25 season) 12/23/2022   Pneumonia Vaccine 41+ Years old  Completed   DEXA SCAN  Completed   HPV VACCINES  Aged Out    Allergies  Allergen Reactions   Amlodipine Swelling     leg swelling    Diltiazem Swelling    Bilateral feet   Methotrexate Swelling     swelling in ankles    Metronidazole Swelling     leg swelling   Other Other (See Comments)    Medication:Methychloroisothlazolinone/methylisothiazilinoneCL +Me-Isothiazolinone Reaction: unknown-patch testing  Medication:glyceryl thioglycolate  Reaction:unknown-patch testing   Quaternium-15 Other (See Comments)    unknown reaction-patching testing    Outpatient Encounter Medications as of 07/01/2023  Medication Sig   Cholecalciferol (VITAMIN D) 50 MCG (2000 UT) CAPS Take 2,000-4,000 Units by mouth See admin instructions. Take 4000 units in the morning and 2000 units in the evening   clobetasol cream (TEMOVATE) 0.05 % Apply 1 Application topically daily as needed (itching).   colchicine 0.6 MG tablet Take 1 tablet (0.6 mg total) by mouth daily as needed. If the patient experiences a gout flare-up, can give two tablets that day and then one tablet daily for 2 weeks.   ELIQUIS 5 MG TABS tablet TAKE ONE TABLET BY MOUTH TWICE DAILY   furosemide (LASIX) 40 MG tablet Take 2 tablets (80 mg total) by mouth daily.   gabapentin (NEURONTIN) 300 MG capsule Take 300 mg by mouth daily as needed  (itching / pain).   hydroxychloroquine (PLAQUENIL) 200 MG tablet Take 200 mg by mouth 2 (two) times daily.   lipase/protease/amylase (CREON) 36000 UNITS CPEP capsule Take 2 capsules with each meal. Take 1 capsule with each snack (up to 2 snacks) daily.   methocarbamol (ROBAXIN) 500 MG tablet Take 1 tablet (500 mg total) by mouth every 6 (six) hours as needed for muscle spasms.   Multiple Vitamin (MULTIVITAMIN WITH MINERALS) TABS tablet Take 1 tablet by mouth in the morning. One-A-Day for Women 50+   oxyCODONE (OXY IR/ROXICODONE) 5 MG immediate release tablet Take 1-2 tablets (5-10 mg total) by mouth every 4 (four) hours as needed for moderate pain (pain score 4-6) (pain score 4-6).   polyethylene glycol (MIRALAX / GLYCOLAX) 17 g packet Take 17 g by mouth daily as needed.   potassium chloride (KLOR-CON) 10 MEQ tablet Take 2 tablets (20 mEq total) by mouth 2 (two) times daily.   Propylene Glycol (SYSTANE BALANCE) 0.6 % SOLN Place 1 drop into both eyes at bedtime.   sennosides-docusate sodium (SENOKOT-S) 8.6-50 MG tablet Take 2 tablets by mouth 2 (two) times daily.   No facility-administered encounter medications on file as of 07/01/2023.    Review of Systems  Constitutional:  Positive for activity change. Negative for appetite change.  HENT: Negative.    Respiratory:  Negative for cough and shortness of breath.   Cardiovascular:  Negative for leg swelling.  Gastrointestinal:  Negative for constipation.  Genitourinary: Negative.   Musculoskeletal:  Negative for arthralgias, gait problem and myalgias.  Skin: Negative.   Neurological:  Negative for dizziness and weakness.  Psychiatric/Behavioral:  Negative for confusion, dysphoric mood and sleep disturbance.     There were no vitals filed for this visit. There is no height or weight on file to calculate BMI. Physical Exam Vitals reviewed.  Constitutional:      Appearance: Normal appearance.  HENT:     Head: Normocephalic.  Nose: Nose  normal.     Mouth/Throat:     Mouth: Mucous membranes are moist.     Pharynx: Oropharynx is clear.  Eyes:     Pupils: Pupils are equal, round, and reactive to light.  Cardiovascular:     Rate and Rhythm: Normal rate and regular rhythm.     Pulses: Normal pulses.     Heart sounds: Normal heart sounds. No murmur heard. Pulmonary:     Effort: Pulmonary effort is normal.     Breath sounds: Normal breath sounds.  Abdominal:     General: Abdomen is flat. Bowel sounds are normal.     Palpations: Abdomen is soft.  Musculoskeletal:        General: No swelling.     Cervical back: Neck supple.  Skin:    General: Skin is warm.  Neurological:     General: No focal deficit present.     Mental Status: She is alert and oriented to person, place, and time.  Psychiatric:        Mood and Affect: Mood normal.        Thought Content: Thought content normal.     Labs reviewed: Basic Metabolic Panel: Recent Labs    06/11/23 1159 06/22/23 0405  NA 142 139  K 3.3* 3.7  CL 99 101  CO2 30 28  GLUCOSE 114* 139*  BUN 16 16  CREATININE 0.46 0.65  CALCIUM 8.9 8.2*   Liver Function Tests: No results for input(s): "AST", "ALT", "ALKPHOS", "BILITOT", "PROT", "ALBUMIN" in the last 8760 hours. No results for input(s): "LIPASE", "AMYLASE" in the last 8760 hours. No results for input(s): "AMMONIA" in the last 8760 hours. CBC: Recent Labs    06/11/23 1159 06/22/23 0405  WBC 6.5 13.9*  HGB 13.4 11.5*  HCT 41.6 35.6*  MCV 93.9 95.7  PLT 218 188   Cardiac Enzymes: No results for input(s): "CKTOTAL", "CKMB", "CKMBINDEX", "TROPONINI" in the last 8760 hours. BNP: Invalid input(s): "POCBNP" No results found for: "HGBA1C" No results found for: "TSH" No results found for: "VITAMINB12" No results found for: "FOLATE" No results found for: "IRON", "TIBC", "FERRITIN"  Imaging and Procedures obtained prior to SNF admission: DG Knee Left Port Result Date: 06/21/2023 CLINICAL DATA:  [id number] Status  post total left knee replacement [id number] EXAM: PORTABLE LEFT KNEE - 1-2 VIEW COMPARISON:  None Available. FINDINGS: Left knee arthroplasty in expected alignment. No periprosthetic lucency or fracture. There has been patellar resurfacing. Recent postsurgical change includes air and edema in the soft tissues and joint space. Anterior skin staples. IMPRESSION: Left knee arthroplasty without immediate postoperative complication. Electronically Signed   By: Narda Rutherford M.D.   On: 06/21/2023 12:54    Assessment/Plan 1. S/P total knee arthroplasty, left (Primary) Pain Controlled Walking with her walker Wants to go home  Follow up with Ortho   2 Persistent atrial fibrillation (HCC) Eliquis  3. Drug-induced constipation Senna 4 LE edema On Lasix   Follow with her PCP Family/ staff Communication:   Labs/tests ordered:

## 2023-07-01 NOTE — Progress Notes (Signed)
 HPI: FU PAF. 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 Monitor October 2018 showed paroxysmal atririllation and flutter. Nuclear study October 2018 showed ejection fraction 67%, apical thinning but no ischemia. Placed on apixaban.  Had implantable loop recorder placed by Dr. Johney Frame for syncope and recurrent palpitations. She has been found to have "heart pounding" episodes not related to arrhythmia. Echo repeated 2/21 showed normal LV function, grade 1 DD, mild to moderate LAE. Coronary CTA 2/21 showed Ca score 36 and minimal (0-24%) in proximal LM, proximal and mid LAD and proximal Lcx.  Patient had atrial fibrillation ablation at [facility] May 2022.  Seen by Dr. Elberta Fortis May 2024 with recurrent episodes of atrial fibrillation and had been scheduled for repeat ablation in October but she canceled this due to upcoming knee surgery.  Had knee replacement February 2025.  Since last seen she denies increased dyspnea, chest pain, palpitations or syncope.  Current Outpatient Medications  Medication Sig Dispense Refill   Cholecalciferol (VITAMIN D) 50 MCG (2000 UT) CAPS Take 2,000-4,000 Units by mouth See admin instructions. Take 4000 units in the morning and 2000 units in the evening     clobetasol cream (TEMOVATE) 0.05 % Apply 1 Application topically daily as needed (itching).     ELIQUIS 5 MG TABS tablet TAKE ONE TABLET BY MOUTH TWICE DAILY 60 tablet 5   furosemide (LASIX) 40 MG tablet Take 2 tablets (80 mg total) by mouth daily. 180 tablet 3   gabapentin (NEURONTIN) 300 MG capsule Take 300 mg by mouth daily as needed (itching / pain).     hydroxychloroquine (PLAQUENIL) 200 MG tablet Take 200 mg by mouth 2 (two) times daily.     lipase/protease/amylase (CREON) 36000 UNITS CPEP capsule Take 2 capsules with each meal. Take 1 capsule with each snack (up to 2 snacks) daily. 300 capsule 2   methocarbamol (ROBAXIN) 500 MG tablet Take 1 tablet (500 mg total) by mouth every 6 (six) hours as  needed for muscle spasms. 40 tablet 0   Multiple Vitamin (MULTIVITAMIN WITH MINERALS) TABS tablet Take 1 tablet by mouth in the morning. One-A-Day for Women 50+     potassium chloride (KLOR-CON) 10 MEQ tablet Take 2 tablets (20 mEq total) by mouth 2 (two) times daily. 360 tablet 3   Propylene Glycol (SYSTANE BALANCE) 0.6 % SOLN Place 1 drop into both eyes at bedtime.     No current facility-administered medications for this visit.     Past Medical History:  Diagnosis Date   Atherosclerosis    mild cerebral   Atrial fibrillation and flutter (HCC)    Autoimmune disorder (HCC)    Autoimmune encephalitis    Cervical disc disease    with spondylosis   Cervical spondylosis    Chronic dermatitis    eczematous   Colitis    COVID 10/2020   DDD (degenerative disc disease), lumbar    Frozen shoulder    left   Hyperlipidemia    Hypertension    IBS (irritable bowel syndrome)    with diarrhea Pt stated she never had this   Lateral epicondylitis    Lumbar spondylosis    Lupus    cutaneous   Movement disorder    Multiple lesions on CT of brain and spine    reports lesions from previous CT scan at [facility]    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   PONV (postoperative nausea and vomiting)    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  Torn meniscus    left    Past Surgical History:  Procedure Laterality Date   A FLUTTER ABLATION  08/2020   [facility] and had cardioversion at the same time   CATARACT EXTRACTION     COLONOSCOPY     EYE SURGERY Left    HEMORRHOID BANDING     KNEE ARTHROSCOPY Left 08/09/2020   Procedure: LEFT KNEE ARTHROSCOPY MEDIAL AND LATERAL MENISECTONMY AND CONDROPLASTY;  Surgeon: Marcene Corning, MD;  Location: [facility];  Service: Orthopedics;  Laterality: Left;   LOOP RECORDER INSERTION N/A 07/05/2017   Procedure: LOOP RECORDER INSERTION;  Surgeon: Hillis Range, MD;  [facility];  Service: Cardiovascular;  Laterality: N/A;   TOTAL KNEE  ARTHROPLASTY Right 04/24/2022   Procedure: RIGHT TOTAL KNEE ARTHROPLASTY;  Surgeon: Kathryne Hitch, MD;  Location: [facility];  Service: Orthopedics;  Laterality: Right;   TOTAL KNEE ARTHROPLASTY Left 06/21/2023   Procedure: LEFT TOTAL KNEE ARTHROPLASTY;  Surgeon: Kathryne Hitch, MD;  Location: [facility];  Service: Orthopedics;  Laterality: Left;   TUBAL LIGATION  1980   WISDOM TOOTH EXTRACTION      Social History   Socioeconomic History   Marital status: Married    Spouse name: Brett Canales   Number of children: 2   Years of education: MAx2   Highest education level: Not on file  Occupational History   Occupation: Retired    Associate Professor: [employer]  Tobacco Use   Smoking status: Never   Smokeless tobacco: Never  Vaping Use   Vaping status: Never Used  Substance and Sexual Activity   Alcohol use: Yes    Alcohol/week: 4.0 standard drinks of alcohol    Types: 4 Glasses of wine per week    Comment: 7 glasses of wine weekly   Drug use: No   Sexual activity: Not Currently  Other Topics Concern   Not on file  Social History Narrative   Patient lives at home with her spouse.   Caffeine Use: 2-3 cups daily   Social Drivers of Health   Financial Resource Strain: Not on file  Food Insecurity: No Food Insecurity (06/21/2023)   Hunger Vital Sign    Worried About Running Out of Food in the Last Year: Never true    Ran Out of Food in the Last Year: Never true  Transportation Needs: No Transportation Needs (06/21/2023)   PRAPARE - Administrator, Civil Service (Medical): No    Lack of Transportation (Non-Medical): No  Physical Activity: Not on file  Stress: Not on file  Social Connections: Patient Declined (06/21/2023)   Social Connection and Isolation Panel [NHANES]    Frequency of Communication with Friends and Family: Patient declined    Frequency of Social Gatherings with Friends and Family: Patient declined    Attends Religious Services: Patient declined    Loss adjuster, chartered or Organizations: Patient declined    Attends Banker Meetings: Patient declined    Marital Status: Patient declined  Intimate Partner Violence: Not At Risk (06/21/2023)   Humiliation, Afraid, Rape, and Kick questionnaire    Fear of Current or Ex-Partner: No    Emotionally Abused: No    Physically Abused: No    Sexually Abused: No    Family History  Problem Relation Age of Onset   Cancer Mother 36       colon cancer   Stroke Mother    Colon cancer Mother 65   Heart attack Father 47  Emphysema Father    Cancer Maternal Grandmother 66       uterine   Thyroid disease Maternal Grandmother    Thrombosis Maternal Grandfather    Thyroid disease Son     ROS: Residual knee discomfort from recent replacement but no fevers or chills, productive cough, hemoptysis, dysphasia, odynophagia, melena, hematochezia, dysuria, hematuria, rash, seizure activity, orthopnea, PND,  claudication. Remaining systems are negative.  Physical Exam: Well-developed well-nourished in no acute distress.  Skin is warm and dry.  HEENT is normal.  Neck is supple.  Chest is clear to auscultation with normal expansion.  Cardiovascular exam is regular rate and rhythm.  2/6 systolic ejection murmur Abdominal exam nontender or distended. No masses palpated. Extremities show 1+ edema left lower extremity/knee replacement neuro grossly intact   A/P  1 paroxysmal atrial fibrillation-continue Toprol and apixaban.  Patient remains in sinus rhythm on examination.  2 hypertension-blood pressure elevated; she is having residual discomfort from recent knee replacement.  She would like to follow her blood pressure at home and we will add additional medications with goal systolic blood pressure less than 140 and diastolic less than 85.  3 coronary artery disease-minimal on previous CTA.  Previously declined all lipid-lowering medications.  She is not on aspirin given need for apixaban.  4 lower  extremity edema-continue diuretic at present dose.  5 history of syncope-no recurrences.  Olga Millers, MD

## 2023-07-02 DIAGNOSIS — R2689 Other abnormalities of gait and mobility: Secondary | ICD-10-CM | POA: Diagnosis not present

## 2023-07-02 DIAGNOSIS — R278 Other lack of coordination: Secondary | ICD-10-CM | POA: Diagnosis not present

## 2023-07-02 DIAGNOSIS — Z96651 Presence of right artificial knee joint: Secondary | ICD-10-CM | POA: Diagnosis not present

## 2023-07-02 DIAGNOSIS — M6281 Muscle weakness (generalized): Secondary | ICD-10-CM | POA: Diagnosis not present

## 2023-07-02 DIAGNOSIS — I10 Essential (primary) hypertension: Secondary | ICD-10-CM | POA: Diagnosis not present

## 2023-07-02 DIAGNOSIS — M62562 Muscle wasting and atrophy, not elsewhere classified, left lower leg: Secondary | ICD-10-CM | POA: Diagnosis not present

## 2023-07-02 DIAGNOSIS — M1732 Unilateral post-traumatic osteoarthritis, left knee: Secondary | ICD-10-CM | POA: Diagnosis not present

## 2023-07-02 DIAGNOSIS — Z96652 Presence of left artificial knee joint: Secondary | ICD-10-CM | POA: Diagnosis not present

## 2023-07-02 DIAGNOSIS — M25562 Pain in left knee: Secondary | ICD-10-CM | POA: Diagnosis not present

## 2023-07-02 LAB — LAB REPORT - SCANNED

## 2023-07-03 ENCOUNTER — Encounter: Payer: Self-pay | Admitting: Cardiology

## 2023-07-04 ENCOUNTER — Encounter: Payer: Self-pay | Admitting: Physician Assistant

## 2023-07-04 ENCOUNTER — Ambulatory Visit (INDEPENDENT_AMBULATORY_CARE_PROVIDER_SITE_OTHER): Payer: Medicare PPO | Admitting: Physician Assistant

## 2023-07-04 ENCOUNTER — Other Ambulatory Visit (INDEPENDENT_AMBULATORY_CARE_PROVIDER_SITE_OTHER)

## 2023-07-04 DIAGNOSIS — Z96652 Presence of left artificial knee joint: Secondary | ICD-10-CM | POA: Diagnosis not present

## 2023-07-04 MED ORDER — METHOCARBAMOL 500 MG PO TABS: 500.0000 mg | ORAL_TABLET | Freq: Four times a day (QID) | ORAL | 0 refills | Status: AC | PRN

## 2023-07-04 NOTE — Progress Notes (Signed)
 HPI: Mrs. Kreiger returns today status post left total knee arthroplasty.  She is ambulating with a rolling walker.  States she wants x-rays because she felt a pop in her knee the other day.  She is taking Tylenol Extra Strength and Neurontin every other night mainly to sleep.  Ranks her knee pain at worst to be 7 out of 10 pain.  But he feels the knee really has more pressure and discomfort than pain.  She is needed to be set up for physical therapy for her knee and wants to do this outside of [facility].  Review of systems: Negative for fevers chills.  Physical exam: General Well-developed well-nourished female in no acute distress mood and affect appropriate.  Psych alert and oriented x 3. Left knee: Surgical incision is well-approximated with staples no signs of infection.  Staples are harvested Steri-Strips applied.  Lacks few degrees in full extension and flexes to 100 degrees she is able to do straight leg raise.  Left calf supple nontender dorsiflexion plantarflexion left ankle intact.  Radiographs: Left knee 2 views: Shows the knee to be well located.  No acute fractures or acute findings.  No change compared to postoperative radiographs.  Impression: Status post left total knee arthroplasty 06/21/2023  Plan: Will set her up for outpatient therapy for her knee for range of motion strengthening home exercise program and modalities.  She will work on scar tissue mobilization.  She is able to shower but not submerge the leg.  Refill on Robaxin sent into the city pharmacy for her.  She will continue to take her Tylenol Extra Strength or pain.  She is on chronic Eliquis.

## 2023-07-05 ENCOUNTER — Other Ambulatory Visit: Payer: Self-pay | Admitting: Radiology

## 2023-07-05 DIAGNOSIS — Z96652 Presence of left artificial knee joint: Secondary | ICD-10-CM

## 2023-07-09 DIAGNOSIS — R278 Other lack of coordination: Secondary | ICD-10-CM | POA: Diagnosis not present

## 2023-07-09 DIAGNOSIS — R296 Repeated falls: Secondary | ICD-10-CM | POA: Diagnosis not present

## 2023-07-09 DIAGNOSIS — R2689 Other abnormalities of gait and mobility: Secondary | ICD-10-CM | POA: Diagnosis not present

## 2023-07-11 DIAGNOSIS — R278 Other lack of coordination: Secondary | ICD-10-CM | POA: Diagnosis not present

## 2023-07-11 DIAGNOSIS — R296 Repeated falls: Secondary | ICD-10-CM | POA: Diagnosis not present

## 2023-07-11 DIAGNOSIS — R2689 Other abnormalities of gait and mobility: Secondary | ICD-10-CM | POA: Diagnosis not present

## 2023-07-12 DIAGNOSIS — R2689 Other abnormalities of gait and mobility: Secondary | ICD-10-CM | POA: Diagnosis not present

## 2023-07-12 DIAGNOSIS — R278 Other lack of coordination: Secondary | ICD-10-CM | POA: Diagnosis not present

## 2023-07-12 DIAGNOSIS — R296 Repeated falls: Secondary | ICD-10-CM | POA: Diagnosis not present

## 2023-07-15 ENCOUNTER — Ambulatory Visit: Payer: Medicare PPO | Attending: Cardiology | Admitting: Cardiology

## 2023-07-15 ENCOUNTER — Encounter: Payer: Self-pay | Admitting: Cardiology

## 2023-07-15 VITALS — BP 148/80 | HR 84 | Ht 64.0 in | Wt 206.0 lb

## 2023-07-15 DIAGNOSIS — I2510 Atherosclerotic heart disease of native coronary artery without angina pectoris: Secondary | ICD-10-CM | POA: Diagnosis not present

## 2023-07-15 DIAGNOSIS — I10 Essential (primary) hypertension: Secondary | ICD-10-CM | POA: Diagnosis not present

## 2023-07-15 DIAGNOSIS — E782 Mixed hyperlipidemia: Secondary | ICD-10-CM | POA: Diagnosis not present

## 2023-07-15 DIAGNOSIS — I480 Paroxysmal atrial fibrillation: Secondary | ICD-10-CM | POA: Diagnosis not present

## 2023-07-15 NOTE — Patient Instructions (Signed)
 Medication Instructions:  Your physician recommends that you continue on your current medications as directed. Please refer to the Current Medication list given to you today.    *If you need a refill on your cardiac medications before your next appointment, please call your pharmacy*   Lab Work: None    If you have labs (blood work) drawn today and your tests are completely normal, you will receive your results only by: MyChart Message (if you have MyChart) OR A paper copy in the mail If you have any lab test that is abnormal or we need to change your treatment, we will call you to review the results.   Testing/Procedures: None    Follow-Up: At [facility], you and your health needs are our priority.  As part of our continuing mission to provide you with exceptional heart care, we have created designated Provider Care Teams.  These Care Teams include your primary Cardiologist (physician) and Advanced Practice Providers (APPs -  Physician Assistants and Nurse Practitioners) who all work together to provide you with the care you need, when you need it.  We recommend signing up for the patient portal called "MyChart".  Sign up information is provided on this After Visit Summary.  MyChart is used to connect with patients for Virtual Visits (Telemedicine).  Patients are able to view lab/test results, encounter notes, upcoming appointments, etc.  Non-urgent messages can be sent to your provider as well.   To learn more about what you can do with MyChart, go to [email or web address].    Your next appointment:   6 month(s)  The format for your next appointment:   In Person  Provider:   Olga Millers, MD    Other Instructions

## 2023-07-16 DIAGNOSIS — R278 Other lack of coordination: Secondary | ICD-10-CM | POA: Diagnosis not present

## 2023-07-16 DIAGNOSIS — R2689 Other abnormalities of gait and mobility: Secondary | ICD-10-CM | POA: Diagnosis not present

## 2023-07-16 DIAGNOSIS — R296 Repeated falls: Secondary | ICD-10-CM | POA: Diagnosis not present

## 2023-07-19 DIAGNOSIS — R2689 Other abnormalities of gait and mobility: Secondary | ICD-10-CM | POA: Diagnosis not present

## 2023-07-19 DIAGNOSIS — R296 Repeated falls: Secondary | ICD-10-CM | POA: Diagnosis not present

## 2023-07-19 DIAGNOSIS — R278 Other lack of coordination: Secondary | ICD-10-CM | POA: Diagnosis not present

## 2023-07-23 ENCOUNTER — Encounter: Payer: Self-pay | Admitting: Cardiology

## 2023-07-23 DIAGNOSIS — I10 Essential (primary) hypertension: Secondary | ICD-10-CM

## 2023-07-23 MED ORDER — LOSARTAN POTASSIUM 50 MG PO TABS: 50.0000 mg | ORAL_TABLET | Freq: Every day | ORAL | 3 refills | Status: AC

## 2023-07-24 ENCOUNTER — Other Ambulatory Visit: Payer: Self-pay | Admitting: Cardiology

## 2023-07-24 DIAGNOSIS — I480 Paroxysmal atrial fibrillation: Secondary | ICD-10-CM

## 2023-07-24 NOTE — Telephone Encounter (Signed)
 Prescription refill request for Eliquis received. Indication:afib Last office visit:3/25 Scr:0.65  3/25 Age: 83 Weight:93.4  kg  Prescription refilled

## 2023-08-01 DIAGNOSIS — L300 Nummular dermatitis: Secondary | ICD-10-CM | POA: Diagnosis not present

## 2023-08-01 DIAGNOSIS — L932 Other local lupus erythematosus: Secondary | ICD-10-CM | POA: Diagnosis not present

## 2023-08-01 DIAGNOSIS — L0100 Impetigo, unspecified: Secondary | ICD-10-CM | POA: Diagnosis not present

## 2023-08-07 ENCOUNTER — Other Ambulatory Visit: Payer: Self-pay

## 2023-08-07 DIAGNOSIS — E876 Hypokalemia: Secondary | ICD-10-CM

## 2023-08-07 DIAGNOSIS — I10 Essential (primary) hypertension: Secondary | ICD-10-CM | POA: Diagnosis not present

## 2023-08-07 MED ORDER — POTASSIUM CHLORIDE ER 10 MEQ PO TBCR: 20.0000 meq | EXTENDED_RELEASE_TABLET | Freq: Two times a day (BID) | ORAL | 3 refills | Status: AC

## 2023-08-08 ENCOUNTER — Encounter: Payer: Self-pay | Admitting: *Deleted

## 2023-08-08 LAB — BASIC METABOLIC PANEL WITH GFR
BUN/Creatinine Ratio: 25 (ref 12–28)
BUN: 12 mg/dL (ref 8–27)
CO2: 26 mmol/L (ref 20–29)
Calcium: 9 mg/dL (ref 8.7–10.3)
Chloride: 100 mmol/L (ref 96–106)
Creatinine, Ser: 0.48 mg/dL — ABNORMAL LOW (ref 0.57–1.00)
Glucose: 84 mg/dL (ref 70–99)
Potassium: 4 mmol/L (ref 3.5–5.2)
Sodium: 141 mmol/L (ref 134–144)
eGFR: 96 mL/min/{1.73_m2} (ref >59)

## 2023-08-15 ENCOUNTER — Encounter: Payer: Self-pay | Admitting: Orthopaedic Surgery

## 2023-08-15 ENCOUNTER — Encounter: Admitting: Physician Assistant

## 2023-08-15 ENCOUNTER — Ambulatory Visit: Admitting: Orthopaedic Surgery

## 2023-08-15 DIAGNOSIS — Z96652 Presence of left artificial knee joint: Secondary | ICD-10-CM

## 2023-08-15 NOTE — Addendum Note (Signed)
 Addended by: Artis Beggs on: 08/15/2023 02:24 PM   Modules accepted: Orders

## 2023-08-15 NOTE — Progress Notes (Signed)
 The patient comes in today at 8 weeks status post a left total knee arthroplasty.  We replaced her right knee in January 2024.  She is very active 83 year old female.  Her biggest complaint is not pain from the knee replacement itself but more pain from the lateral aspect of the knee and the IT band up to the hip.  She has had therapy at [facility] but she would prefer to have some therapy here and I think this is absolutely reasonable.  She still struggles some getting up from a seated position.  She is also been dealing with a rash around both knees that is almost like a metal allergy but she has never had another allergy in the past.  She is seeing her dermatologist and she is having some cream to put on her knees.  Her right knee and left knee do have good range of motion and feels stable.  She does have appropriate pain around the IT band.  Her left knee had significant valgus malalignment prior to surgery.  We will set her up for outpatient physical therapy here upstairs and they will work on her balance and coordination and strengthening from getting up from a seated position as well as any modalities that can help decrease her IT band pain from the hip down to the knee on that left side.  We will then see her back in a month to see how she is doing overall but no x-rays are needed.

## 2023-08-16 ENCOUNTER — Other Ambulatory Visit: Payer: Self-pay

## 2023-08-16 DIAGNOSIS — Z96652 Presence of left artificial knee joint: Secondary | ICD-10-CM

## 2023-08-21 DIAGNOSIS — Z1231 Encounter for screening mammogram for malignant neoplasm of breast: Secondary | ICD-10-CM | POA: Diagnosis not present

## 2023-08-22 ENCOUNTER — Ambulatory Visit: Admitting: Physical Therapy

## 2023-08-22 ENCOUNTER — Encounter: Payer: Self-pay | Admitting: Physical Therapy

## 2023-08-22 DIAGNOSIS — M25662 Stiffness of left knee, not elsewhere classified: Secondary | ICD-10-CM | POA: Diagnosis not present

## 2023-08-22 DIAGNOSIS — M25562 Pain in left knee: Secondary | ICD-10-CM | POA: Diagnosis not present

## 2023-08-22 DIAGNOSIS — M6281 Muscle weakness (generalized): Secondary | ICD-10-CM | POA: Diagnosis not present

## 2023-08-22 NOTE — Therapy (Signed)
 OUTPATIENT PHYSICAL THERAPY EVALUATION   Patient Name: Lindsay Clark MRN: [id number] DOB:31-Aug-1940, 83 y.o., female Today's Date: 08/22/2023  END OF SESSION:  PT End of Session - 08/22/23 1302     Visit Number 1    Number of Visits 6    Date for PT Re-Evaluation 10/03/23    Authorization Type [employer]    Progress Note Due on Visit 10    PT Start Time 1302    PT Stop Time 1346    PT Time Calculation (min) 44 min    Activity Tolerance Patient tolerated treatment well    Behavior During Therapy WFL for tasks assessed/performed             Past Medical History:  Diagnosis Date   Atherosclerosis    mild cerebral   Atrial fibrillation and flutter (HCC)    Autoimmune disorder (HCC)    Autoimmune encephalitis    Cervical disc disease    with spondylosis   Cervical spondylosis    Chronic dermatitis    eczematous   Colitis    COVID 10/2020   DDD (degenerative disc disease), lumbar    Frozen shoulder    left   Hyperlipidemia    Hypertension    IBS (irritable bowel syndrome)    with diarrhea Pt stated she never had this   Lateral epicondylitis    Lumbar spondylosis    Lupus    cutaneous   Movement disorder    Multiple lesions on CT of brain and spine    reports lesions from previous CT scan at [facility]    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   PONV (postoperative nausea and vomiting)    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   Torn meniscus    left   Past Surgical History:  Procedure Laterality Date   A FLUTTER ABLATION  08/2020   [facility] and had cardioversion at the same time   CATARACT EXTRACTION     COLONOSCOPY     EYE SURGERY Left    HEMORRHOID BANDING     KNEE ARTHROSCOPY Left 08/09/2020   Procedure: LEFT KNEE ARTHROSCOPY MEDIAL AND LATERAL MENISECTONMY AND CONDROPLASTY;  Surgeon: Dayne Even, MD;  Location: [facility];  Service: Orthopedics;  Laterality: Left;   LOOP RECORDER INSERTION N/A 07/05/2017   Procedure: LOOP RECORDER INSERTION;  Surgeon:  Jolly Needle, MD;  Location: [facility];  Service: Cardiovascular;  Laterality: N/A;   TOTAL KNEE ARTHROPLASTY Right 04/24/2022   Procedure: RIGHT TOTAL KNEE ARTHROPLASTY;  Surgeon: Arnie Lao, MD;  Location: [facility];  Service: Orthopedics;  Laterality: Right;   TOTAL KNEE ARTHROPLASTY Left 06/21/2023   Procedure: LEFT TOTAL KNEE ARTHROPLASTY;  Surgeon: Arnie Lao, MD;  Location: [facility];  Service: Orthopedics;  Laterality: Left;   TUBAL LIGATION  1980   WISDOM TOOTH EXTRACTION     Patient Active Problem List   Diagnosis Date Noted   Status post total left knee replacement 06/21/2023   Vulvovaginal candidiasis 03/28/2023   Vaginal atrophy 03/28/2023   Vaginal odor 03/28/2023   Postmenopausal estrogen deficiency 03/28/2023   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/24/2022   Acquired genu valgum 10/05/2021   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   Left knee pain 08/09/2020   Persistent atrial fibrillation (HCC) 04/05/2020   Secondary hypercoagulable state (HCC) 04/05/2020   Primary osteoarthritis of left knee 09/02/2019   Prolapsed internal hemorrhoids, grade 2 03/03/2019   Brain lesion 06/24/2017   Osteoarthritis  Multiple lesions on CT of brain and spine    Movement disorder    Lumbar spondylosis    Hyperlipidemia    DDD (degenerative disc disease), lumbar    Dermatitis    Cervical spondylosis    Cervical disc disease    Diastolic dysfunction 07/11/2016   Syncope 04/18/2016   Anxiety 04/18/2016   Hypertension    Autoimmune disorder-autoimmune encephalitis    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   Coronary artery calcification of native artery 11/04/2014   Abnormal finding on MRI of brain 10/04/2014   Pruritus 11/30/2011   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    PCP: Victorio Grave, MD  REFERRING PROVIDER: Arnie Lao, MD  REFERRING DIAG: [phone] (ICD-10-CM) - Status post left knee replacement  Rationale for  Evaluation and Treatment: Rehabilitation  THERAPY DIAG:  Acute pain of left knee - Plan: PT plan of care cert/re-cert  Muscle weakness (generalized) - Plan: PT plan of care cert/re-cert  Stiffness of left knee, not elsewhere classified - Plan: PT plan of care cert/re-cert  ONSET DATE: DOS 06/21/23   SUBJECTIVE:                                                                                                                                                                                           SUBJECTIVE STATEMENT: Pt reports Lt knee and ITB pain.  She reports 3 falls around the time of the [facility], but her LT knee was replaced a couple months later.  She c/o lateral Lt knee pain which has been present since surgery.    PERTINENT HISTORY:  Cervical DDD, lumbar DDD, HTN, hyperlipidemia, OA, Lupus, bil TKA  PAIN:  Are you having pain? Yes: NPRS scale: 0 currently, up to 10/10 Pain location: Lt lateral knee up along IT band to hip Pain description: "raw nerve pain" Aggravating factors: moving leg to the left Relieving factors: avoiding position  PRECAUTIONS:  Fall  RED FLAGS: None   WEIGHT BEARING RESTRICTIONS:  No  FALLS:  Has patient fallen in last 6 months? Yes. Number of falls 3 (prior to TKA)  LIVING ENVIRONMENT: Lives with: lives with their spouse Lives in: Other [facility]  OCCUPATION:  Retired - Chartered loss adjuster  PLOF:  Independent and Leisure: play piano, play bridge, no regular exercise (enjoys walking)  PATIENT GOALS:  Improve pain   OBJECTIVE:  Note: Objective measures were completed at Evaluation unless otherwise noted.  PATIENT SURVEYS:  Patient-Specific Activity Scoring Scheme  "0" represents "unable to perform." "10" represents "able to perform at prior level. 0 1 2 3 4 5 6 7 8 9  10 (Date and Score)  Activity Eval     1. Walking 7     2. Putting on socks 4     3. Balance 5   Score 5.33    Total score = sum of the  activity scores/number of activities Minimum detectable change (90%CI) for average score = 2 points Minimum detectable change (90%CI) for single activity score = 3 points    COGNITIVE STATUS: Within functional limits for tasks assessed   POSTURE:  No Significant postural limitations   GAIT: 08/22/23 Comments: walks with RLE in ER; bil trendelenburg gait   PALPATION: 08/22/23 tenderness along Lt ITB and distal/lateral knee     LOWER EXTREMITY MMT:    MMT Right eval Left eval  Hip flexion 4/5 3+/5  Hip extension  4/5  Hip abduction  2/5  Hip adduction    Hip internal rotation    Hip external rotation    Knee flexion 5/5 5/5  Knee extension 5/5 5/5  Ankle dorsiflexion    Ankle plantarflexion    Ankle inversion    Ankle eversion     (Blank rows = not tested)    FUNCTIONAL TESTS:  08/22/23  difficulty rising from chair noted   TREATMENT:                                                                                                                              DATE:  08/22/23 TherEx See HEP - demonstrated with trial reps performed PRN, min cues for comprehension    Manual KT tape to lateral Lt knee in "x" pattern on 30% stretch; educated on tape application, benefits, precautions and removal  Self Care Educated on clinical findings and POC    PATIENT EDUCATION:  Education details: HEP, taping Person educated: Patient Education method: Explanation, Demonstration, and Handouts Education comprehension: verbalized understanding, returned demonstration, and needs further education  HOME EXERCISE PROGRAM: Access Code: [email or web address] URL: [email or web address] Date: 08/22/2023 Prepared by: Casimer Clear  Exercises - Standing Hip Hiking (Mirrored)  - 1 x daily - 7 x weekly - 2 sets - 10 reps - 5 sec hold - Clamshell  - 1 x daily - 7 x weekly - 2 sets - 10 reps - 3-5 sec hold - Sidelying Hip Circles (Mirrored)  - 2 x daily - 7 x weekly - 2  sets - 10 circles each way   ASSESSMENT:  CLINICAL IMPRESSION: Patient is a 83 y.o. female who was seen today for physical therapy evaluation and treatment for Lt knee and lateral thigh pain. She demonstrates continued pain and balance and strength deficits affecting functional mobility.  She will benefit from PT to address deficits listed.     OBJECTIVE IMPAIRMENTS: Abnormal gait, decreased balance, decreased endurance, decreased mobility, difficulty walking, decreased strength, increased fascial restrictions, increased muscle spasms, and pain.   ACTIVITY LIMITATIONS: carrying, lifting, standing, squatting, transfers, and locomotion level  PARTICIPATION LIMITATIONS: meal prep, cleaning, laundry, driving, shopping, and community activity  PERSONAL FACTORS:  Past/current experiences, Time since onset of injury/illness/exacerbation, and 3+ comorbidities: Cervical DDD, lumbar DDD, HTN, hyperlipidemia, OA, Lupus, bil TKA  are also affecting patient's functional outcome.   REHAB POTENTIAL: Good  CLINICAL DECISION MAKING: Evolving/moderate complexity  EVALUATION COMPLEXITY: Moderate   GOALS: Goals reviewed with patient? Yes  SHORT TERM GOALS: Target date: 09/12/2023  Independent with initial HEP Goal status: INITIAL   LONG TERM GOALS: Target date: 10/03/2023   Independent with final HEP Goal status: INITIAL  2.  PSFS score improved by 2 points Goal status: INITIAL  3.  Lt hip abduction strength improved to 4/5 for improved mobility Goal status: INIITAL  4.  Report pain < 3/10 with standing, walking and lateral movements for improved function Goal status: INITIAL  5.  Balance goal(s) to follow Goal status: INITIAL     PLAN:  PT FREQUENCY: 1x/week  PT DURATION: 6 weeks  PLANNED INTERVENTIONS: 97164- PT Re-evaluation, 97750- Physical Performance Testing, 97110-Therapeutic exercises, 97530- Therapeutic activity, [id number]- Neuromuscular re-education, 97535- Self Care,  97140- Manual therapy, [id number]- Gait training, 16109- Aquatic Therapy, [phone]- Electrical stimulation (unattended), 97016- Vasopneumatic device, [id number]- Ultrasound, [id number]- Ionotophoresis 4mg /ml Dexamethasone , Patient/Family education, Balance training, Taping, Dry Needling, Cryotherapy, and Moist heat.  PLAN FOR NEXT SESSION: Review HEP, needs hip strengthening, how was taping, needs balance assessment  cannot do NuStep and bike   NEXT MD VISIT: 09/11/23   Marley Simmers, PT, DPT 08/22/23 2:28 PM     Referring diagnosis?  U98.119 Treatment diagnosis? (if different than referring diagnosis) M25.562, M62.81, M25.662 What was this (referring dx) caused by? [x]  Surgery []  Fall []  Ongoing issue []  Arthritis []  Other: ____________  Laterality: []  Rt [x]  Lt []  Both  Check all possible CPT codes:  *CHOOSE 10 OR LESS*    See Planned Interventions listed in the Plan section of the Evaluation.

## 2023-08-29 ENCOUNTER — Ambulatory Visit: Payer: Self-pay | Admitting: Rehabilitative and Restorative Service Providers"

## 2023-08-29 ENCOUNTER — Encounter: Payer: Self-pay | Admitting: Rehabilitative and Restorative Service Providers"

## 2023-08-29 DIAGNOSIS — M6281 Muscle weakness (generalized): Secondary | ICD-10-CM | POA: Diagnosis not present

## 2023-08-29 DIAGNOSIS — M25562 Pain in left knee: Secondary | ICD-10-CM | POA: Diagnosis not present

## 2023-08-29 DIAGNOSIS — M25662 Stiffness of left knee, not elsewhere classified: Secondary | ICD-10-CM | POA: Diagnosis not present

## 2023-08-29 NOTE — Therapy (Signed)
 OUTPATIENT PHYSICAL THERAPY TREATMENT   Patient Name: Lindsay Clark MRN: [id number] DOB:[birth year], 83 y.o., female Today's Date: 08/29/2023  END OF SESSION:  PT End of Session - 08/29/23 1451     Visit Number 2    Number of Visits 6    Date for PT Re-Evaluation 10/03/23    Authorization Type [employer]    Authorization - Number of Visits 12    Progress Note Due on Visit 10    PT Start Time 1435    PT Stop Time 1513    PT Time Calculation (min) 38 min    Activity Tolerance Patient tolerated treatment well    Behavior During Therapy WFL for tasks assessed/performed              Past Medical History:  Diagnosis Date   Atherosclerosis    mild cerebral   Atrial fibrillation and flutter (HCC)    Autoimmune disorder (HCC)    Autoimmune encephalitis    Cervical disc disease    with spondylosis   Cervical spondylosis    Chronic dermatitis    eczematous   Colitis    COVID 10/2020   DDD (degenerative disc disease), lumbar    Frozen shoulder    left   Hyperlipidemia    Hypertension    IBS (irritable bowel syndrome)    with diarrhea Pt stated she never had this   Lateral epicondylitis    Lumbar spondylosis    Lupus    cutaneous   Movement disorder    Multiple lesions on CT of brain and spine    reports lesions from previous CT scan at [facility]    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   PONV (postoperative nausea and vomiting)    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   Torn meniscus    left   Past Surgical History:  Procedure Laterality Date   A FLUTTER ABLATION  08/2020   [facility] and had cardioversion at the same time   CATARACT EXTRACTION     COLONOSCOPY     EYE SURGERY Left    HEMORRHOID BANDING     KNEE ARTHROSCOPY Left 08/09/2020   Procedure: LEFT KNEE ARTHROSCOPY MEDIAL AND LATERAL MENISECTONMY AND CONDROPLASTY;  Surgeon: Dayne Even, MD;  Location: [facility];  Service: Orthopedics;  Laterality: Left;   LOOP RECORDER INSERTION N/A 07/05/2017    Procedure: LOOP RECORDER INSERTION;  Surgeon: Jolly Needle, MD;  [facility]: [facility];  Service: Cardiovascular;  Laterality: N/A;   TOTAL KNEE ARTHROPLASTY Right 04/24/2022   Procedure: RIGHT TOTAL KNEE ARTHROPLASTY;  Surgeon: Arnie Lao, MD;  Location: [facility];  Service: Orthopedics;  Laterality: Right;   TOTAL KNEE ARTHROPLASTY Left 06/21/2023   Procedure: LEFT TOTAL KNEE ARTHROPLASTY;  Surgeon: Arnie Lao, MD;  Location: [facility];  Service: Orthopedics;  Laterality: Left;   TUBAL LIGATION  1980   WISDOM TOOTH EXTRACTION     Patient Active Problem List   Diagnosis Date Noted   Status post total left knee replacement 06/21/2023   Vulvovaginal candidiasis 03/28/2023   Vaginal atrophy 03/28/2023   Vaginal odor 03/28/2023   Postmenopausal estrogen deficiency 03/28/2023   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/24/2022   Acquired genu valgum 10/05/2021   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   Left knee pain 08/09/2020   Persistent atrial fibrillation (HCC) 04/05/2020   Secondary hypercoagulable state (HCC) 04/05/2020   Primary osteoarthritis of left knee 09/02/2019   Prolapsed internal hemorrhoids, grade 2  03/03/2019   Brain lesion 06/24/2017   Osteoarthritis    Multiple lesions on CT of brain and spine    Movement disorder    Lumbar spondylosis    Hyperlipidemia    DDD (degenerative disc disease), lumbar    Dermatitis    Cervical spondylosis    Cervical disc disease    Diastolic dysfunction 07/11/2016   Syncope 04/18/2016   Anxiety 04/18/2016   Hypertension    Autoimmune disorder-autoimmune encephalitis    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   Coronary artery calcification of native artery 11/04/2014   Abnormal finding on MRI of brain 10/04/2014   Pruritus 11/30/2011   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    PCP: Victorio Grave, MD  REFERRING PROVIDER: Arnie Lao, MD  REFERRING DIAG: [phone] (ICD-10-CM) - Status post  left knee replacement  Rationale for Evaluation and Treatment: Rehabilitation  THERAPY DIAG:  Acute pain of left knee  Muscle weakness (generalized)  Stiffness of left knee, not elsewhere classified  ONSET DATE: DOS 06/21/23   SUBJECTIVE:                                                                                                                                                                                           SUBJECTIVE STATEMENT: Pt indicated having some increased pain after last visit.  Reported not having much success with taping.  Reported some questions about HEP.   PERTINENT HISTORY:  Cervical DDD, lumbar DDD, HTN, hyperlipidemia, OA, Lupus, bil TKA  PAIN:  NPRS scale: no specific pain at rest.  Pain location: Lt lateral knee up along IT band to hip Pain description: "raw nerve pain" Aggravating factors: moving leg to the left Relieving factors: avoiding position  PRECAUTIONS:  Fall  RED FLAGS: None   WEIGHT BEARING RESTRICTIONS:  No  FALLS:  Has patient fallen in last 6 months? Yes. Number of falls 3 (prior to TKA)  LIVING ENVIRONMENT: Lives with: lives with their spouse Lives in: Other [facility]  OCCUPATION:  Retired - Chartered loss adjuster  PLOF:   Independent and Leisure: play piano, play bridge, no regular exercise (enjoys walking)  PATIENT GOALS:  Improve pain   OBJECTIVE:  Note: Objective measures were completed at Evaluation unless otherwise noted.  PATIENT SURVEYS:  Patient-Specific Activity Scoring Scheme  "0" represents "unable to perform." "10" represents "able to perform at prior level. 0 1 2 3 4 5 6 7 8 9  10 (Date and Score)   Activity Eval  08/22/23    1. Walking 7     2. Putting on socks 4     3. Balance 5   Score 5.33  Total score = sum of the activity scores/number of activities Minimum detectable change (90%CI) for average score = 2 points Minimum detectable change (90%CI) for single activity  score = 3 points    COGNITIVE STATUS: 08/22/23 Within functional limits for tasks assessed   POSTURE:  08/22/23 No Significant postural limitations   GAIT: 08/22/23 Comments: walks with RLE in ER; bil trendelenburg gait   PALPATION: 08/22/23 tenderness along Lt ITB and distal/lateral knee     LOWER EXTREMITY MMT:    MMT Right Eval 08/22/23 Left Eval 08/22/23  Hip flexion 4/5 3+/5  Hip extension  4/5  Hip abduction  2/5  Hip adduction    Hip internal rotation    Hip external rotation    Knee flexion 5/5 5/5  Knee extension 5/5 5/5  Ankle dorsiflexion    Ankle plantarflexion    Ankle inversion    Ankle eversion     (Blank rows = not tested)    FUNCTIONAL TESTS:  08/29/2023: Able to perform sit to stand to sit from 20 inch table height s UE assist.   08/22/23 difficulty rising from chair noted                  TREATMENT       DATE: 08/29/2023 Therex: Standing hip hike at counter top 3 sec hold x 6 bilateral with verbal and visual cues for techniques and activity Sidelying Lt hip clam shell 2 x 10  Sidelying Lt hip small circles cw, ccw  x 10 each Supine bridge 2 x 10  Sit to stand education for strengthening x 10 with slow lowering focus.   Additional time spent in education and conversation about rationale of intervention, techniques of intervention and process of use.    TREATMENT       DATE: 08/22/23 TherEx See HEP - demonstrated with trial reps performed PRN, min cues for comprehension    Manual KT tape to lateral Lt knee in "x" pattern on 30% stretch; educated on tape application, benefits, precautions and removal  Self Care Educated on clinical findings and POC    PATIENT EDUCATION:  08/29/2023  Education details: HEP update Person educated: Patient Education method: Explanation, Demonstration, and Handouts Education comprehension: verbalized understanding, returned demonstration, and needs further education  HOME EXERCISE  PROGRAM: Access Code: [email or web address] URL: [email or web address] Date: 08/29/2023 Prepared by: Bonna Bustard  Exercises - Standing Hip Hiking (Mirrored)  - 1 x daily - 7 x weekly - 2 sets - 10 reps - 5 sec hold - Clamshell  - 1 x daily - 7 x weekly - 2 sets - 10 reps - 3-5 sec hold - Sidelying Hip Circles (Mirrored)  - 2 x daily - 7 x weekly - 2 sets - 10 circles each way - Supine Bridge  - 1 x daily - 7 x weekly - 2 sets - 10 reps - 2 hold - Supine Piriformis Stretch with Foot on Ground  - 2 x daily - 7 x weekly - 1 sets - 3-5 reps - 15 hold   ASSESSMENT:  CLINICAL IMPRESSION: Pt continued to show deficits in strength and endurance related to Lt hip//leg that impacts daily activity including progressive mobility.  Continued HEP and skilled PT services to benefit to address restrictions/impairments.   Held taping as Pt indicated no specific benefit.   OBJECTIVE IMPAIRMENTS: Abnormal gait, decreased balance, decreased endurance, decreased mobility, difficulty walking, decreased strength, increased fascial restrictions, increased muscle spasms, and pain.   ACTIVITY LIMITATIONS: carrying, lifting,  standing, squatting, transfers, and locomotion level  PARTICIPATION LIMITATIONS: meal prep, cleaning, laundry, driving, shopping, and community activity  PERSONAL FACTORS: Past/current experiences, Time since onset of injury/illness/exacerbation, and 3+ comorbidities: Cervical DDD, lumbar DDD, HTN, hyperlipidemia, OA, Lupus, bil TKA are also affecting patient's functional outcome.   REHAB POTENTIAL: Good  CLINICAL DECISION MAKING: Evolving/moderate complexity  EVALUATION COMPLEXITY: Moderate   GOALS: Goals reviewed with patient? Yes  SHORT TERM GOALS: Target date: 09/12/2023  Independent with initial HEP Goal status: on going 08/29/2023   LONG TERM GOALS: Target date: 10/03/2023   Independent with final HEP Goal status: INITIAL  2.  PSFS score improved by 2 points Goal  status: INITIAL  3.  Lt hip abduction strength improved to 4/5 for improved mobility Goal status: INIITAL  4.  Report pain < 3/10 with standing, walking and lateral movements for improved function Goal status: INITIAL  5.  Balance goal(s) to follow Goal status: INITIAL     PLAN:  PT FREQUENCY: 1x/week  PT DURATION: 6 weeks  PLANNED INTERVENTIONS: 97164- PT Re-evaluation, 97750- Physical Performance Testing, 97110-Therapeutic exercises, 97530- Therapeutic activity, [id number]- Neuromuscular re-education, 97535- Self Care, [id number]- Manual therapy, [id number]- Gait training, [phone]- Aquatic Therapy, [phone]- Electrical stimulation (unattended), 97016- Vasopneumatic device, [id number]- Ultrasound, [id number]- Ionotophoresis 4mg /ml Dexamethasone , Patient/Family education, Balance training, Taping, Dry Needling, Cryotherapy, and Moist heat.  PLAN FOR NEXT SESSION: Strengthening, maybe myofascial release/STM for lateral hip/thigh??  cannot do NuStep and bike   NEXT MD VISIT: 09/11/23   Bonna Bustard, PT, DPT, OCS, ATC 08/29/23  3:22 PM      Referring diagnosis?  U44.034 Treatment diagnosis? (if different than referring diagnosis) M25.562, M62.81, M25.662 What was this (referring dx) caused by? [x]  Surgery []  Fall []  Ongoing issue []  Arthritis []  Other: ____________  Laterality: []  Rt [x]  Lt []  Both  Check all possible CPT codes:  *CHOOSE 10 OR LESS*    See Planned Interventions listed in the Plan section of the Evaluation.

## 2023-09-03 ENCOUNTER — Telehealth: Payer: Self-pay | Admitting: Physical Medicine & Rehabilitation

## 2023-09-03 ENCOUNTER — Ambulatory Visit: Admitting: Physical Therapy

## 2023-09-03 ENCOUNTER — Encounter: Payer: Self-pay | Admitting: Physical Therapy

## 2023-09-03 DIAGNOSIS — M25562 Pain in left knee: Secondary | ICD-10-CM

## 2023-09-03 DIAGNOSIS — M25662 Stiffness of left knee, not elsewhere classified: Secondary | ICD-10-CM

## 2023-09-03 DIAGNOSIS — M6281 Muscle weakness (generalized): Secondary | ICD-10-CM

## 2023-09-03 NOTE — Telephone Encounter (Signed)
 Pt called and wanted to schedule a bil cort knee inj w [facility]. She stated she has had 2 knee replacements in the past  2 years. One was January of last year and the other February of this year. It has also been a little while since she got injections from [facility]. Is this okay to schedule?

## 2023-09-03 NOTE — Therapy (Signed)
 OUTPATIENT PHYSICAL THERAPY TREATMENT   Patient Name: Lindsay Clark MRN: [id number] DOB:06/21/1940, 83 y.o., female Today's Date: 09/03/2023  END OF SESSION:  PT End of Session - 09/03/23 1314     Visit Number 3    Number of Visits 6    Date for PT Re-Evaluation 10/03/23    Authorization Type [employer]    Authorization - Number of Visits 12    Progress Note Due on Visit 10    PT Start Time 1312   pt arrived late   PT Stop Time 1350    PT Time Calculation (min) 38 min    Activity Tolerance Patient tolerated treatment well    Behavior During Therapy WFL for tasks assessed/performed               Past Medical History:  Diagnosis Date   Atherosclerosis    mild cerebral   Atrial fibrillation and flutter (HCC)    Autoimmune disorder (HCC)    Autoimmune encephalitis    Cervical disc disease    with spondylosis   Cervical spondylosis    Chronic dermatitis    eczematous   Colitis    COVID 10/2020   DDD (degenerative disc disease), lumbar    Frozen shoulder    left   Hyperlipidemia    Hypertension    IBS (irritable bowel syndrome)    with diarrhea Pt stated she never had this   Lateral epicondylitis    Lumbar spondylosis    Lupus    cutaneous   Movement disorder    Multiple lesions on CT of brain and spine    reports lesions from previous CT scan at [facility]    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   PONV (postoperative nausea and vomiting)    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   Torn meniscus    left   Past Surgical History:  Procedure Laterality Date   A FLUTTER ABLATION  08/2020   [facility] and had cardioversion at the same time   CATARACT EXTRACTION     COLONOSCOPY     EYE SURGERY Left    HEMORRHOID BANDING     KNEE ARTHROSCOPY Left 08/09/2020   Procedure: LEFT KNEE ARTHROSCOPY MEDIAL AND LATERAL MENISECTONMY AND CONDROPLASTY;  Surgeon: Dayne Even, MD;  Location: [facility];  Service: Orthopedics;  Laterality: Left;   LOOP RECORDER INSERTION  N/A 07/05/2017   Procedure: LOOP RECORDER INSERTION;  Surgeon: Jolly Needle, MD;  Location: [facility];  Service: Cardiovascular;  Laterality: N/A;   TOTAL KNEE ARTHROPLASTY Right 04/24/2022   Procedure: RIGHT TOTAL KNEE ARTHROPLASTY;  Surgeon: Arnie Lao, MD;  Location: [facility];  Service: Orthopedics;  Laterality: Right;   TOTAL KNEE ARTHROPLASTY Left 06/21/2023   Procedure: LEFT TOTAL KNEE ARTHROPLASTY;  Surgeon: Arnie Lao, MD;  Location: [facility];  Service: Orthopedics;  Laterality: Left;   TUBAL LIGATION  1980   WISDOM TOOTH EXTRACTION     Patient Active Problem List   Diagnosis Date Noted   Status post total left knee replacement 06/21/2023   Vulvovaginal candidiasis 03/28/2023   Vaginal atrophy 03/28/2023   Vaginal odor 03/28/2023   Postmenopausal estrogen deficiency 03/28/2023   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/24/2022   Acquired genu valgum 10/05/2021   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   Left knee pain 08/09/2020   Persistent atrial fibrillation (HCC) 04/05/2020   Secondary hypercoagulable state (HCC) 04/05/2020   Primary osteoarthritis of left knee 09/02/2019  Prolapsed internal hemorrhoids, grade 2 03/03/2019   Brain lesion 06/24/2017   Osteoarthritis    Multiple lesions on CT of brain and spine    Movement disorder    Lumbar spondylosis    Hyperlipidemia    DDD (degenerative disc disease), lumbar    Dermatitis    Cervical spondylosis    Cervical disc disease    Diastolic dysfunction 07/11/2016   Syncope 04/18/2016   Anxiety 04/18/2016   Hypertension    Autoimmune disorder-autoimmune encephalitis    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   Coronary artery calcification of native artery 11/04/2014   Abnormal finding on MRI of brain 10/04/2014   Pruritus 11/30/2011   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    PCP: Victorio Grave, MD  REFERRING PROVIDER: Arnie Lao, MD  REFERRING DIAG: [phone]  (ICD-10-CM) - Status post left knee replacement  Rationale for Evaluation and Treatment: Rehabilitation  THERAPY DIAG:  Acute pain of left knee  Muscle weakness (generalized)  Stiffness of left knee, not elsewhere classified  ONSET DATE: DOS 06/21/23   SUBJECTIVE:                                                                                                                                                                                           SUBJECTIVE STATEMENT: Doing okay today; still having pain in Lt knee.   PERTINENT HISTORY:  Cervical DDD, lumbar DDD, HTN, hyperlipidemia, OA, Lupus, bil TKA  PAIN:  NPRS scale: no specific pain at rest.  Pain location: Lt lateral knee up along IT band to hip Pain description: "raw nerve pain" Aggravating factors: moving leg to the left Relieving factors: avoiding position  PRECAUTIONS:  Fall  RED FLAGS: None   WEIGHT BEARING RESTRICTIONS:  No  FALLS:  Has patient fallen in last 6 months? Yes. Number of falls 3 (prior to TKA)  LIVING ENVIRONMENT: Lives with: lives with their spouse Lives in: Other [facility]  OCCUPATION:  Retired - Chartered loss adjuster  PLOF:   Independent and Leisure: play piano, play bridge, no regular exercise (enjoys walking)  PATIENT GOALS:  Improve pain   OBJECTIVE:  Note: Objective measures were completed at Evaluation unless otherwise noted.  PATIENT SURVEYS:  Patient-Specific Activity Scoring Scheme  "0" represents "unable to perform." "10" represents "able to perform at prior level. 0 1 2 3 4 5 6 7 8 9  10 (Date and Score)   Activity Eval  08/22/23    1. Walking 7     2. Putting on socks 4     3. Balance 5   Score 5.33    Total score = sum of the activity scores/number  of activities Minimum detectable change (90%CI) for average score = 2 points Minimum detectable change (90%CI) for single activity score = 3 points    COGNITIVE STATUS: 08/22/23 Within functional  limits for tasks assessed   POSTURE:  08/22/23 No Significant postural limitations   GAIT: 08/22/23 Comments: walks with RLE in ER; bil trendelenburg gait   PALPATION: 08/22/23 tenderness along Lt ITB and distal/lateral knee     LOWER EXTREMITY MMT:    MMT Right Eval 08/22/23 Left Eval 08/22/23  Hip flexion 4/5 3+/5  Hip extension  4/5  Hip abduction  2/5  Hip adduction    Hip internal rotation    Hip external rotation    Knee flexion 5/5 5/5  Knee extension 5/5 5/5  Ankle dorsiflexion    Ankle plantarflexion    Ankle inversion    Ankle eversion     (Blank rows = not tested)    FUNCTIONAL TESTS:  08/29/2023: Able to perform sit to stand to sit from 20 inch table height s UE assist.   08/22/23 difficulty rising from chair noted                  TREATMENT 09/03/23 TherEx Hooklying single limb clamshell x10; L3 band Bridges with isometric abduction hold L3 band; 3-5 sec hold  TherAct Sit to/from stand from 22" height x 10 reps; cues for slow descent Standing hip abduction x 10 reps bil; light UE support needed Standing hip extension x 10 reps bil; light UE support needed  Manual Trial of percussive device; pt unable to tolerate so ceased activity  08/29/2023 Therex: Standing hip hike at counter top 3 sec hold x 6 bilateral with verbal and visual cues for techniques and activity Sidelying Lt hip clam shell 2 x 10  Sidelying Lt hip small circles cw, ccw  x 10 each Supine bridge 2 x 10  Sit to stand education for strengthening x 10 with slow lowering focus.   Additional time spent in education and conversation about rationale of intervention, techniques of intervention and process of use.    08/22/23 TherEx See HEP - demonstrated with trial reps performed PRN, min cues for comprehension    Manual KT tape to lateral Lt knee in "x" pattern on 30% stretch; educated on tape application, benefits, precautions and removal  Self Care Educated on clinical  findings and POC    PATIENT EDUCATION:  08/29/2023  Education details: HEP update Person educated: Patient Education method: Explanation, Demonstration, and Handouts Education comprehension: verbalized understanding, returned demonstration, and needs further education  HOME EXERCISE PROGRAM: Access Code: [email or web address] URL: [email or web address] Date: 08/29/2023 Prepared by: Bonna Bustard  Exercises - Standing Hip Hiking (Mirrored)  - 1 x daily - 7 x weekly - 2 sets - 10 reps - 5 sec hold - Clamshell  - 1 x daily - 7 x weekly - 2 sets - 10 reps - 3-5 sec hold - Sidelying Hip Circles (Mirrored)  - 2 x daily - 7 x weekly - 2 sets - 10 circles each way - Supine Bridge  - 1 x daily - 7 x weekly - 2 sets - 10 reps - 2 hold - Supine Piriformis Stretch with Foot on Ground  - 2 x daily - 7 x weekly - 1 sets - 3-5 reps - 15 hold   ASSESSMENT:  CLINICAL IMPRESSION: Pt tolerated session well today with continued work on functional strengthening.  Continue skilled PT at this time.   Held taping as Pt indicated  no specific benefit.   OBJECTIVE IMPAIRMENTS: Abnormal gait, decreased balance, decreased endurance, decreased mobility, difficulty walking, decreased strength, increased fascial restrictions, increased muscle spasms, and pain.   ACTIVITY LIMITATIONS: carrying, lifting, standing, squatting, transfers, and locomotion level  PARTICIPATION LIMITATIONS: meal prep, cleaning, laundry, driving, shopping, and community activity  PERSONAL FACTORS: Past/current experiences, Time since onset of injury/illness/exacerbation, and 3+ comorbidities: Cervical DDD, lumbar DDD, HTN, hyperlipidemia, OA, Lupus, bil TKA are also affecting patient's functional outcome.   REHAB POTENTIAL: Good  CLINICAL DECISION MAKING: Evolving/moderate complexity  EVALUATION COMPLEXITY: Moderate   GOALS: Goals reviewed with patient? Yes  SHORT TERM GOALS: Target date: 09/12/2023  Independent with  initial HEP Goal status: on going 08/29/2023   LONG TERM GOALS: Target date: 10/03/2023   Independent with final HEP Goal status: INITIAL  2.  PSFS score improved by 2 points Goal status: INITIAL  3.  Lt hip abduction strength improved to 4/5 for improved mobility Goal status: INIITAL  4.  Report pain < 3/10 with standing, walking and lateral movements for improved function Goal status: INITIAL  5.  Balance goal(s) to follow Goal status: INITIAL     PLAN:  PT FREQUENCY: 1x/week  PT DURATION: 6 weeks  PLANNED INTERVENTIONS: 97164- PT Re-evaluation, 97750- Physical Performance Testing, 97110-Therapeutic exercises, 97530- Therapeutic activity, [id number]- Neuromuscular re-education, 97535- Self Care, 16109- Manual therapy, [id number]- Gait training, [phone]- Aquatic Therapy, [phone]- Electrical stimulation (unattended), 97016- Vasopneumatic device, [id number]- Ultrasound, [id number]- Ionotophoresis 4mg /ml Dexamethasone , Patient/Family education, Balance training, Taping, Dry Needling, Cryotherapy, and Moist heat.  PLAN FOR NEXT SESSION: Strengthening, maybe myofascial release/STM for lateral hip/thigh??  cannot do NuStep and bike, does not tolerate the percussive device   NEXT MD VISIT: 09/11/23   Marley Simmers, PT, DPT 09/03/23 2:26 PM       Referring diagnosis?  B14.782 Treatment diagnosis? (if different than referring diagnosis) M25.562, M62.81, M25.662 What was this (referring dx) caused by? [x]  Surgery []  Fall []  Ongoing issue []  Arthritis []  Other: ____________  Laterality: []  Rt [x]  Lt []  Both  Check all possible CPT codes:  *CHOOSE 10 OR LESS*    See Planned Interventions listed in the Plan section of the Evaluation.

## 2023-09-09 ENCOUNTER — Other Ambulatory Visit: Payer: Self-pay | Admitting: Cardiology

## 2023-09-09 DIAGNOSIS — G249 Dystonia, unspecified: Secondary | ICD-10-CM | POA: Diagnosis not present

## 2023-09-09 DIAGNOSIS — R609 Edema, unspecified: Secondary | ICD-10-CM

## 2023-09-10 ENCOUNTER — Telehealth: Payer: Self-pay | Admitting: Cardiology

## 2023-09-10 DIAGNOSIS — M720 Palmar fascial fibromatosis [Dupuytren]: Secondary | ICD-10-CM | POA: Diagnosis not present

## 2023-09-10 DIAGNOSIS — M79641 Pain in right hand: Secondary | ICD-10-CM | POA: Diagnosis not present

## 2023-09-10 DIAGNOSIS — R609 Edema, unspecified: Secondary | ICD-10-CM

## 2023-09-10 MED ORDER — FUROSEMIDE 40 MG PO TABS: 80.0000 mg | ORAL_TABLET | Freq: Every day | ORAL | 3 refills | Status: AC

## 2023-09-10 NOTE — Telephone Encounter (Signed)
 Pt's medication furosemide  was never sent to pt's pharmacy. I sent pt's medication to pt's pharmacy as requested. Confirmation received.

## 2023-09-10 NOTE — Telephone Encounter (Signed)
*  STAT* If patient is at the pharmacy, call can be transferred to refill team.   1. Which medications need to be refilled? (please list name of each medication and dose if known)   furosemide  (LASIX ) 40 MG tablet    2. Which pharmacy/location (including street and city if local pharmacy) is medication to be sent to? [facility] [place], [place] - [address] [facility] [address] Phone: [phone]  Fax: [phone]     3. Do they need a 30 day or 90 day supply? 90 Pt calling upset that prescription has not been sent in. It shows it was sent so I called the pharmacy. They said they have requested it three times. Pharmacy said it keeps coming over with a error message and they are asking we send in new script.

## 2023-09-11 ENCOUNTER — Encounter: Payer: Self-pay | Admitting: Orthopaedic Surgery

## 2023-09-11 ENCOUNTER — Ambulatory Visit (INDEPENDENT_AMBULATORY_CARE_PROVIDER_SITE_OTHER): Admitting: Orthopaedic Surgery

## 2023-09-11 DIAGNOSIS — Z96652 Presence of left artificial knee joint: Secondary | ICD-10-CM

## 2023-09-11 NOTE — Progress Notes (Signed)
 Lindsay Clark is an 83 year old who is now almost 3 months status post a left total knee replacement.  We replaced her right knee about a year ago.  She is still frustrated in terms of her mobility and being able to get up from a seated position however her swelling is decreased and she denies any significant pain.  She does feel like her IT band on the left side was aggravated in rehab at [facility] by traveling therapist.  She is now upstairs here having therapy and has a few more sessions.  Examination of her left knee does show a rash around the incision.  She had this on the other side but has seen a dermatologist.  I want her to place hydrocortisone cream or other creams on this.  It did clear up much better on the right knee.  She denies any history of metal allergies.  She has good range of motion of her left and right knees.  Obviously her feet and ankle when she stands shows some incompetence of the posterior tibial tendon and this is affecting her gait.  She says it is difficult to use an assistive devices such as a cane due to her feet and ankles and the way to turn out.  From my standpoint I have given her encouragement that I do think some of this will improve.  However she was still like to get up from a seated position much easier than she does.  We will see her back in 3 months with a standing AP and lateral of her more recent left operative knee.

## 2023-09-12 ENCOUNTER — Ambulatory Visit: Admitting: Physical Therapy

## 2023-09-12 ENCOUNTER — Encounter: Payer: Self-pay | Admitting: Physical Therapy

## 2023-09-12 DIAGNOSIS — M25562 Pain in left knee: Secondary | ICD-10-CM | POA: Diagnosis not present

## 2023-09-12 DIAGNOSIS — M6281 Muscle weakness (generalized): Secondary | ICD-10-CM

## 2023-09-12 DIAGNOSIS — M25662 Stiffness of left knee, not elsewhere classified: Secondary | ICD-10-CM

## 2023-09-12 NOTE — Therapy (Signed)
 OUTPATIENT PHYSICAL THERAPY TREATMENT DISCHARGE SUMMARY   Patient Name: Lindsay Clark MRN: [id number] DOB:Jan 03, 1941, 83 y.o., female Today's Date: 09/12/2023  END OF SESSION:  PT End of Session - 09/12/23 1419     Visit Number 4    Authorization Type [employer]    Authorization - Number of Visits 12    Progress Note Due on Visit 10    PT Start Time 1350    PT Stop Time 1417   pt arrived late and requested to leave early   PT Time Calculation (min) 27 min    Activity Tolerance Patient tolerated treatment well    Behavior During Therapy WFL for tasks assessed/performed                Past Medical History:  Diagnosis Date   Atherosclerosis    mild cerebral   Atrial fibrillation and flutter (HCC)    Autoimmune disorder (HCC)    Autoimmune encephalitis    Cervical disc disease    with spondylosis   Cervical spondylosis    Chronic dermatitis    eczematous   Colitis    COVID 10/2020   DDD (degenerative disc disease), lumbar    Frozen shoulder    left   Hyperlipidemia    Hypertension    IBS (irritable bowel syndrome)    with diarrhea Pt stated she never had this   Lateral epicondylitis    Lumbar spondylosis    Lupus    cutaneous   Movement disorder    Multiple lesions on CT of brain and spine    reports lesions from previous CT scan at [facility]    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   PONV (postoperative nausea and vomiting)    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   Torn meniscus    left   Past Surgical History:  Procedure Laterality Date   A FLUTTER ABLATION  08/2020   [facility] and had cardioversion at the same time   CATARACT EXTRACTION     COLONOSCOPY     EYE SURGERY Left    HEMORRHOID BANDING     KNEE ARTHROSCOPY Left 08/09/2020   Procedure: LEFT KNEE ARTHROSCOPY MEDIAL AND LATERAL MENISECTONMY AND CONDROPLASTY;  Surgeon: Dayne Even, MD;  Location: [facility];  Service: Orthopedics;  Laterality: Left;   LOOP RECORDER INSERTION N/A 07/05/2017    Procedure: LOOP RECORDER INSERTION;  Surgeon: Jolly Needle, MD;  [facility];  Service: Cardiovascular;  Laterality: N/A;   TOTAL KNEE ARTHROPLASTY Right 04/24/2022   Procedure: RIGHT TOTAL KNEE ARTHROPLASTY;  Surgeon: Arnie Lao, MD;  Location: [facility];  Service: Orthopedics;  Laterality: Right;   TOTAL KNEE ARTHROPLASTY Left 06/21/2023   Procedure: LEFT TOTAL KNEE ARTHROPLASTY;  Surgeon: Arnie Lao, MD;  Location: [facility];  Service: Orthopedics;  Laterality: Left;   TUBAL LIGATION  1980   WISDOM TOOTH EXTRACTION     Patient Active Problem List   Diagnosis Date Noted   Status post total left knee replacement 06/21/2023   Vulvovaginal candidiasis 03/28/2023   Vaginal atrophy 03/28/2023   Vaginal odor 03/28/2023   Postmenopausal estrogen deficiency 03/28/2023   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/24/2022   Acquired genu valgum 10/05/2021   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   Left knee pain 08/09/2020   Persistent atrial fibrillation (HCC) 04/05/2020   Secondary hypercoagulable state (HCC) 04/05/2020   Primary osteoarthritis of left knee 09/02/2019   Prolapsed internal hemorrhoids, grade 2 03/03/2019  Brain lesion 06/24/2017   Osteoarthritis    Multiple lesions on CT of brain and spine    Movement disorder    Lumbar spondylosis    Hyperlipidemia    DDD (degenerative disc disease), lumbar    Dermatitis    Cervical spondylosis    Cervical disc disease    Diastolic dysfunction 07/11/2016   Syncope 04/18/2016   Anxiety 04/18/2016   Hypertension    Autoimmune disorder-autoimmune encephalitis    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   Coronary artery calcification of native artery 11/04/2014   Abnormal finding on MRI of brain 10/04/2014   Pruritus 11/30/2011   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    PCP: Victorio Grave, MD  REFERRING PROVIDER: Arnie Lao, MD  REFERRING DIAG: [phone] (ICD-10-CM) - Status  post left knee replacement  Rationale for Evaluation and Treatment: Rehabilitation  THERAPY DIAG:  Acute pain of left knee  Muscle weakness (generalized)  Stiffness of left knee, not elsewhere classified  ONSET DATE: DOS 06/21/23   SUBJECTIVE:                                                                                                                                                                                           SUBJECTIVE STATEMENT: Pain is less frequent, but intensity is unchanged.  Repositioning is the only thing that helps   PERTINENT HISTORY:  Cervical DDD, lumbar DDD, HTN, hyperlipidemia, OA, Lupus, bil TKA  PAIN:  NPRS scale: no specific pain at rest.  Pain location: Lt lateral knee up along IT band to hip Pain description: "raw nerve pain" Aggravating factors: moving leg to the left Relieving factors: avoiding position  PRECAUTIONS:  Fall  RED FLAGS: None   WEIGHT BEARING RESTRICTIONS:  No  FALLS:  Has patient fallen in last 6 months? Yes. Number of falls 3 (prior to TKA)  LIVING ENVIRONMENT: Lives with: lives with their spouse Lives in: Other [facility]  OCCUPATION:  Retired - Chartered loss adjuster  PLOF:   Independent and Leisure: play piano, play bridge, no regular exercise (enjoys walking)  PATIENT GOALS:  Improve pain   OBJECTIVE:  Note: Objective measures were completed at Evaluation unless otherwise noted.  PATIENT SURVEYS:  Patient-Specific Activity Scoring Scheme  "0" represents "unable to perform." "10" represents "able to perform at prior level. 0 1 2 3 4 5 6 7 8 9  10 (Date and Score)   Activity Eval  08/22/23 09/12/23   1. Walking 7   8  2. Putting on socks 4   8  3. Balance 5 5  Score 5.33 7   Total score = sum of the activity scores/number of  activities Minimum detectable change (90%CI) for average score = 2 points Minimum detectable change (90%CI) for single activity score = 3 points    COGNITIVE  STATUS: 08/22/23 Within functional limits for tasks assessed   POSTURE:  08/22/23 No Significant postural limitations   GAIT: 08/22/23 Comments: walks with RLE in ER; bil trendelenburg gait   PALPATION: 08/22/23 tenderness along Lt ITB and distal/lateral knee     LOWER EXTREMITY MMT:    MMT Right Eval 08/22/23 Left Eval 08/22/23 Left 09/12/23  Hip flexion 4/5 3+/5   Hip extension  4/5   Hip abduction  2/5 3/5  Hip adduction     Hip internal rotation     Hip external rotation     Knee flexion 5/5 5/5   Knee extension 5/5 5/5   Ankle dorsiflexion     Ankle plantarflexion     Ankle inversion     Ankle eversion      (Blank rows = not tested)    FUNCTIONAL TESTS:  08/29/2023: Able to perform sit to stand to sit from 20 inch table height s UE assist.   08/22/23 difficulty rising from chair noted                  TREATMENT 09/12/23 TherEx Prone hip extension x10 reps bil Goal assessment and discussion of current HEP - recommended to continue as she is seeing benefit from them Also discussed exercise modifications with pt as well Seated Lt hip IR L4 band 2x10  09/03/23 TherEx Hooklying single limb clamshell x10; L3 band Bridges with isometric abduction hold L3 band; 3-5 sec hold  TherAct Sit to/from stand from 22" height x 10 reps; cues for slow descent Standing hip abduction x 10 reps bil; light UE support needed Standing hip extension x 10 reps bil; light UE support needed  Manual Trial of percussive device; pt unable to tolerate so ceased activity  08/29/2023 Therex: Standing hip hike at counter top 3 sec hold x 6 bilateral with verbal and visual cues for techniques and activity Sidelying Lt hip clam shell 2 x 10  Sidelying Lt hip small circles cw, ccw  x 10 each Supine bridge 2 x 10  Sit to stand education for strengthening x 10 with slow lowering focus.   Additional time spent in education and conversation about rationale of intervention, techniques  of intervention and process of use.    08/22/23 TherEx See HEP - demonstrated with trial reps performed PRN, min cues for comprehension    Manual KT tape to lateral Lt knee in "x" pattern on 30% stretch; educated on tape application, benefits, precautions and removal  Self Care Educated on clinical findings and POC    PATIENT EDUCATION:  08/29/2023  Education details: HEP update Person educated: Patient Education method: Explanation, Demonstration, and Handouts Education comprehension: verbalized understanding, returned demonstration, and needs further education  HOME EXERCISE PROGRAM: Access Code: [email or web address] URL: [email or web address] Date: 09/12/2023 Prepared by: Casimer Clear  Exercises - Standing Hip Hiking (Mirrored)  - 1 x daily - 7 x weekly - 2 sets - 10 reps - 5 sec hold - Clamshell  - 1 x daily - 7 x weekly - 2 sets - 10 reps - 3-5 sec hold - Sidelying Hip Circles (Mirrored)  - 2 x daily - 7 x weekly - 2 sets - 10 circles each way - Supine Bridge  - 1 x daily - 7 x weekly - 2 sets - 10 reps - 2 hold - Supine  Piriformis Stretch with Foot on Ground  - 2 x daily - 7 x weekly - 1 sets - 3-5 reps - 15 hold - Seated Hip Internal Rotation with Resistance  - 1 x daily - 7 x weekly - 1 sets - 10 reps   ASSESSMENT:  CLINICAL IMPRESSION: Pt has met 1 LTG at this time.  She is requesting d/c from PT today.  Will d/c PT.     OBJECTIVE IMPAIRMENTS: Abnormal gait, decreased balance, decreased endurance, decreased mobility, difficulty walking, decreased strength, increased fascial restrictions, increased muscle spasms, and pain.   ACTIVITY LIMITATIONS: carrying, lifting, standing, squatting, transfers, and locomotion level  PARTICIPATION LIMITATIONS: meal prep, cleaning, laundry, driving, shopping, and community activity  PERSONAL FACTORS: Past/current experiences, Time since onset of injury/illness/exacerbation, and 3+ comorbidities: Cervical DDD, lumbar  DDD, HTN, hyperlipidemia, OA, Lupus, bil TKA are also affecting patient's functional outcome.   REHAB POTENTIAL: Good  CLINICAL DECISION MAKING: Evolving/moderate complexity  EVALUATION COMPLEXITY: Moderate   GOALS: Goals reviewed with patient? Yes  SHORT TERM GOALS: Target date: 09/12/2023  Independent with initial HEP Goal status: MET 09/12/23   LONG TERM GOALS: Target date: 10/03/2023   Independent with final HEP Goal status: MET 09/12/23  2.  PSFS score improved by 2 points Goal status: NOT MET 09/12/23  3.  Lt hip abduction strength improved to 4/5 for improved mobility Goal status: NOT MET 09/12/23  4.  Report pain < 3/10 with standing, walking and lateral movements for improved function Goal status: NOT MET (up to 10/10) 09/12/23  5.  Balance goal(s) to follow Goal status: DEFERRED 09/12/23     PLAN:  PT FREQUENCY: 1x/week  PT DURATION: 6 weeks  PLANNED INTERVENTIONS: 97164- PT Re-evaluation, 97750- Physical Performance Testing, 97110-Therapeutic exercises, 97530- Therapeutic activity, [id number]- Neuromuscular re-education, 97535- Self Care, 21308- Manual therapy, [id number]- Gait training, [phone]- Aquatic Therapy, [phone]- Electrical stimulation (unattended), 97016- Vasopneumatic device, [id number]- Ultrasound, [id number]- Ionotophoresis 4mg /ml Dexamethasone , Patient/Family education, Balance training, Taping, Dry Needling, Cryotherapy, and Moist heat.  PLAN FOR NEXT SESSION: d/c PT  cannot do NuStep and bike, does not tolerate the percussive device   NEXT MD VISIT: 09/11/23   Marley Simmers, PT, DPT 09/12/23 2:20 PM    PHYSICAL THERAPY DISCHARGE SUMMARY  Visits from Start of Care: 4  Current functional level related to goals / functional outcomes: See above   Remaining deficits: See above   Education / Equipment: HEP   Patient agrees to discharge. Patient goals were partially met. Patient is being discharged due to the patient's request.   Marley Simmers, PT, DPT 09/12/23 2:22 PM  [facility] Physical Therapy [address] [place], [place], [address] Phone: [phone]   Fax:  [phone]

## 2023-09-18 ENCOUNTER — Encounter: Admitting: Physical Therapy

## 2023-09-25 ENCOUNTER — Encounter: Admitting: Physical Therapy

## 2023-10-08 ENCOUNTER — Encounter: Payer: Self-pay | Admitting: *Deleted

## 2023-10-08 NOTE — Telephone Encounter (Signed)
 Opened in error

## 2023-10-10 ENCOUNTER — Encounter: Payer: Self-pay | Admitting: Physical Medicine & Rehabilitation

## 2023-10-10 ENCOUNTER — Encounter: Attending: Physical Medicine & Rehabilitation | Admitting: Physical Medicine & Rehabilitation

## 2023-10-10 VITALS — BP 123/79 | HR 77 | Ht 64.0 in | Wt 204.6 lb

## 2023-10-10 DIAGNOSIS — G248 Other dystonia: Secondary | ICD-10-CM | POA: Diagnosis not present

## 2023-10-10 NOTE — Progress Notes (Signed)
 Subjective:    Patient ID: Lindsay Clark, female    DOB: October 06, 1940, 83 y.o.   MRN: [id number] Expand All Collapse All    Subjective:    Subjective Patient ID: Lindsay Clark, female    DOB: 07/18/1940, 83 y.o.   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  She also has a history of bilateral knee osteoarthritis and flatfeet.  She resides at the independent living portion of [facility].        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  RIght Tibialis posterior identified, confirmed with electrical stimulation 50 units were injected in 2 sites for a total of 100U Pain Inventory Average Pain 0 Pain Right Now 0 My pain is N/A  In the last 24 hours, has pain interfered with the following? General activity 0 Relation with others 0 Enjoyment of life 0 What TIME of day is your pain at its worst? varies Sleep (in general) Good  Pain is worse with: unsure Pain improves with: No pain per patient Relief from Meds: no pain meds needed  Family History  Problem Relation Age of Onset   Cancer Mother 42       colon cancer   Stroke Mother    Colon cancer Mother 28   Heart attack Father 68   Emphysema Father    Cancer Maternal Grandmother 4       uterine   Thyroid  disease Maternal Grandmother    Thrombosis Maternal Grandfather    Thyroid  disease Son    Social History   Socioeconomic History   Marital status: Married    Spouse name: Siegfried Dress   Number of children: 2   Years of education: MAx2   Highest education level:  Not on file  Occupational History   Occupation: Retired    Associate Professor: [employer]  Tobacco Use   Smoking status: Never   Smokeless tobacco: Never  Vaping Use   Vaping status: Never Used  Substance and Sexual Activity   Alcohol  use: Yes    Alcohol /week: 4.0 standard drinks of alcohol     Types: 4 Glasses of wine per week    Comment: 7 glasses of wine weekly   Drug use: No   Sexual activity: Not Currently  Other Topics Concern   Not on file  Social History Narrative   Patient lives at home with her spouse.   Caffeine Use: 2-3 cups daily   Social Drivers of Health   Financial Resource Strain: Not on file  Food Insecurity: No Food Insecurity (06/21/2023)   Hunger Vital Sign    Worried About Running Out of Food in the Last Year: Never true    Ran Out of Food in the Last Year: Never true  Transportation Needs: No Transportation Needs (06/21/2023)   PRAPARE - Administrator, Civil Service (Medical): No    Lack of Transportation (Non-Medical): No  Physical Activity: Not on file  Stress: Not on file  Social Connections: Patient Declined (06/21/2023)   Social Connection and Isolation Panel    Frequency of Communication with Friends and Family: Patient declined  Frequency of Social Gatherings with Friends and Family: Patient declined    Attends Religious Services: Patient declined    Active Member of Clubs or Organizations: Patient declined    Attends Banker Meetings: Patient declined    Marital Status: Patient declined   Past Surgical History:  Procedure Laterality Date   A FLUTTER ABLATION  08/2020   [facility] and had cardioversion at the same time   CATARACT EXTRACTION     COLONOSCOPY     EYE SURGERY Left    HEMORRHOID BANDING     KNEE ARTHROSCOPY Left 08/09/2020   Procedure: LEFT KNEE ARTHROSCOPY MEDIAL AND LATERAL MENISECTONMY AND CONDROPLASTY;  Surgeon: Dayne Even, MD;  Location: [facility];  Service: Orthopedics;  Laterality: Left;   LOOP RECORDER  INSERTION N/A 07/05/2017   Procedure: LOOP RECORDER INSERTION;  Surgeon: Jolly Needle, MD;  [facility];  Service: Cardiovascular;  Laterality: N/A;   TOTAL KNEE ARTHROPLASTY Right 04/24/2022   Procedure: RIGHT TOTAL KNEE ARTHROPLASTY;  Surgeon: Arnie Lao, MD;  [facility]: [facility];  Service: Orthopedics;  Laterality: Right;   TOTAL KNEE ARTHROPLASTY Left 06/21/2023   Procedure: LEFT TOTAL KNEE ARTHROPLASTY;  Surgeon: Arnie Lao, MD;  Location: [facility];  Service: Orthopedics;  Laterality: Left;   TUBAL LIGATION  1980   WISDOM TOOTH EXTRACTION     Past Surgical History:  Procedure Laterality Date   A FLUTTER ABLATION  08/2020   [facility] and had cardioversion at the same time   CATARACT EXTRACTION     COLONOSCOPY     EYE SURGERY Left    HEMORRHOID BANDING     KNEE ARTHROSCOPY Left 08/09/2020   Procedure: LEFT KNEE ARTHROSCOPY MEDIAL AND LATERAL MENISECTONMY AND CONDROPLASTY;  Surgeon: Dayne Even, MD;  Location: [facility];  Service: Orthopedics;  Laterality: Left;   LOOP RECORDER INSERTION N/A 07/05/2017   Procedure: LOOP RECORDER INSERTION;  Surgeon: Jolly Needle, MD;  [facility];  Service: Cardiovascular;  Laterality: N/A;   TOTAL KNEE ARTHROPLASTY Right 04/24/2022   Procedure: RIGHT TOTAL KNEE ARTHROPLASTY;  Surgeon: Arnie Lao, MD;  [facility];  Service: Orthopedics;  Laterality: Right;   TOTAL KNEE ARTHROPLASTY Left 06/21/2023   Procedure: LEFT TOTAL KNEE ARTHROPLASTY;  Surgeon: Arnie Lao, MD;  [facility];  Service: Orthopedics;  Laterality: Left;   TUBAL LIGATION  1980   WISDOM TOOTH EXTRACTION     Past Medical History:  Diagnosis Date   Atherosclerosis    mild cerebral   Atrial fibrillation and flutter (HCC)    Autoimmune disorder (HCC)    Autoimmune encephalitis    Cervical disc disease    with spondylosis   Cervical spondylosis    Chronic dermatitis    eczematous   Colitis    COVID  10/2020   DDD (degenerative disc disease), lumbar    Frozen shoulder    left   Hyperlipidemia    Hypertension    IBS (irritable bowel syndrome)    with diarrhea Pt stated she never had this   Lateral epicondylitis    Lumbar spondylosis    Lupus    cutaneous   Movement disorder    Multiple lesions on CT of brain and spine    reports lesions from previous CT scan at [facility]    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   PONV (postoperative nausea and vomiting)    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   Torn meniscus  left   BP 123/79 (BP Location: Left Arm, Patient Position: Sitting, Cuff Size: Large)   Pulse 77   Ht 5' 4 (1.626 m)   Wt 204 lb 9.6 oz (92.8 kg)   SpO2 93%   BMI 35.12 kg/m   Opioid Risk Score:   Fall Risk Score:  `1  Depression screen [facility] 2/9     10/10/2023   12:56 PM 12/27/2022   11:12 AM 10/05/2021   11:48 AM 08/17/2021   12:00 PM 05/11/2021   11:44 AM 03/14/2021   11:27 AM 01/19/2021   11:14 AM  Depression screen PHQ 2/9  Decreased Interest 0 0 0 0 0 0 0  Down, Depressed, Hopeless 0 0 0 0 0 0 0  PHQ - 2 Score 0 0 0 0 0 0 0  Tired, decreased energy       0  Change in appetite       0  Feeling bad or failure about yourself        0  Trouble concentrating       0  Moving slowly or fidgety/restless       0       Review of Systems  Musculoskeletal:  Positive for joint swelling.       Joint replacement  All other systems reviewed and are negative.      Objective:   Physical Exam Right foot is held in a inverted posture.  With walking the patient is able to correct to a plantigrade posture. There is no evidence of Achilles shortening she has good range of motion at the foot and ankle area. No evidence of clonus at the ankle.  Normal sensation       Assessment & Plan:  1.  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  We will plan to schedule this next available.  Patient would like to proceed

## 2023-10-29 ENCOUNTER — Encounter: Payer: Self-pay | Admitting: Physical Medicine & Rehabilitation

## 2023-10-29 ENCOUNTER — Encounter: Attending: Physical Medicine & Rehabilitation | Admitting: Physical Medicine & Rehabilitation

## 2023-10-29 VITALS — BP 124/67 | HR 81 | Ht 64.0 in | Wt 204.9 lb

## 2023-10-29 DIAGNOSIS — G248 Other dystonia: Secondary | ICD-10-CM | POA: Insufficient documentation

## 2023-10-29 MED ORDER — SODIUM CHLORIDE (PF) 0.9 % IJ SOLN
2.0000 mL | Freq: Once | INTRAMUSCULAR | Status: AC
Start: 2023-10-29 — End: 2023-10-29
  Administered 2023-10-29: 2 mL

## 2023-10-29 MED ORDER — ONABOTULINUMTOXINA 100 UNITS IJ SOLR
100.0000 [IU] | Freq: Once | INTRAMUSCULAR | Status: AC
Start: 2023-10-29 — End: 2023-10-29
  Administered 2023-10-29: 100 [IU] via INTRAMUSCULAR

## 2023-10-29 NOTE — Patient Instructions (Signed)

## 2023-10-29 NOTE — Progress Notes (Signed)
Right lower extremity ultrasound injection under ultrasound guidance Indication focal dystonia right lower extremity  Botox dilution 50 units/cc  Linear ultrasound utilized to identify tibialis posterior between fibula and tibia Area marked and prepped with Betadine sterile ultrasound gel utilized  80 mm 22-gauge echo bloc needle was inserted under direct long axis ultrasound visualization.  Tibialis posterior identified, confirmed with electrical stimulation 50 units were injected in 2 sites for a total of 100U  Patient tolerated procedure well Post procedure instructions given

## 2023-11-07 DIAGNOSIS — E559 Vitamin D deficiency, unspecified: Secondary | ICD-10-CM | POA: Diagnosis not present

## 2023-11-07 DIAGNOSIS — H2512 Age-related nuclear cataract, left eye: Secondary | ICD-10-CM | POA: Diagnosis not present

## 2023-11-07 DIAGNOSIS — H353132 Nonexudative age-related macular degeneration, bilateral, intermediate dry stage: Secondary | ICD-10-CM | POA: Diagnosis not present

## 2023-11-07 DIAGNOSIS — H35371 Puckering of macula, right eye: Secondary | ICD-10-CM | POA: Diagnosis not present

## 2023-11-07 DIAGNOSIS — Z961 Presence of intraocular lens: Secondary | ICD-10-CM | POA: Diagnosis not present

## 2023-11-07 DIAGNOSIS — H18523 Epithelial (juvenile) corneal dystrophy, bilateral: Secondary | ICD-10-CM | POA: Diagnosis not present

## 2023-11-07 DIAGNOSIS — H26491 Other secondary cataract, right eye: Secondary | ICD-10-CM | POA: Diagnosis not present

## 2023-11-07 DIAGNOSIS — E785 Hyperlipidemia, unspecified: Secondary | ICD-10-CM | POA: Diagnosis not present

## 2023-11-07 DIAGNOSIS — Z79899 Other long term (current) drug therapy: Secondary | ICD-10-CM | POA: Diagnosis not present

## 2023-11-07 DIAGNOSIS — I10 Essential (primary) hypertension: Secondary | ICD-10-CM | POA: Diagnosis not present

## 2023-11-08 DIAGNOSIS — M720 Palmar fascial fibromatosis [Dupuytren]: Secondary | ICD-10-CM | POA: Diagnosis not present

## 2023-11-11 DIAGNOSIS — I10 Essential (primary) hypertension: Secondary | ICD-10-CM | POA: Diagnosis not present

## 2023-11-11 DIAGNOSIS — R252 Cramp and spasm: Secondary | ICD-10-CM | POA: Diagnosis not present

## 2023-11-11 DIAGNOSIS — E785 Hyperlipidemia, unspecified: Secondary | ICD-10-CM | POA: Diagnosis not present

## 2023-11-11 DIAGNOSIS — Z0000 Encounter for general adult medical examination without abnormal findings: Secondary | ICD-10-CM | POA: Diagnosis not present

## 2023-11-11 DIAGNOSIS — I2510 Atherosclerotic heart disease of native coronary artery without angina pectoris: Secondary | ICD-10-CM | POA: Diagnosis not present

## 2023-11-11 DIAGNOSIS — D849 Immunodeficiency, unspecified: Secondary | ICD-10-CM | POA: Diagnosis not present

## 2023-11-11 DIAGNOSIS — I700 Atherosclerosis of aorta: Secondary | ICD-10-CM | POA: Diagnosis not present

## 2023-11-11 DIAGNOSIS — M359 Systemic involvement of connective tissue, unspecified: Secondary | ICD-10-CM | POA: Diagnosis not present

## 2023-11-11 DIAGNOSIS — I480 Paroxysmal atrial fibrillation: Secondary | ICD-10-CM | POA: Diagnosis not present

## 2023-11-11 DIAGNOSIS — I672 Cerebral atherosclerosis: Secondary | ICD-10-CM | POA: Diagnosis not present

## 2023-11-11 LAB — LAB REPORT - SCANNED: EGFR: 94

## 2023-11-12 ENCOUNTER — Other Ambulatory Visit: Payer: Self-pay | Admitting: Family Medicine

## 2023-11-12 DIAGNOSIS — R61 Generalized hyperhidrosis: Secondary | ICD-10-CM

## 2023-11-15 ENCOUNTER — Ambulatory Visit
Admission: RE | Admit: 2023-11-15 | Discharge: 2023-11-15 | Disposition: A | Discharge status: Home / Self Care | Source: Ambulatory Visit | Attending: Family Medicine | Admitting: Family Medicine

## 2023-11-15 ENCOUNTER — Other Ambulatory Visit

## 2023-11-15 DIAGNOSIS — R61 Generalized hyperhidrosis: Secondary | ICD-10-CM | POA: Diagnosis not present

## 2023-11-15 DIAGNOSIS — D259 Leiomyoma of uterus, unspecified: Secondary | ICD-10-CM | POA: Diagnosis not present

## 2023-11-15 MED ORDER — IOPAMIDOL (ISOVUE-300) INJECTION 61%
100.0000 mL | Freq: Once | INTRAVENOUS | Status: AC | PRN
  Administered 2023-11-15: 100 mL via INTRAVENOUS

## 2023-12-04 NOTE — Telephone Encounter (Signed)
 Patient said she says she has not called about cortisone injections.

## 2023-12-05 DIAGNOSIS — L932 Other local lupus erythematosus: Secondary | ICD-10-CM | POA: Diagnosis not present

## 2023-12-05 DIAGNOSIS — I872 Venous insufficiency (chronic) (peripheral): Secondary | ICD-10-CM | POA: Diagnosis not present

## 2023-12-05 DIAGNOSIS — L300 Nummular dermatitis: Secondary | ICD-10-CM | POA: Diagnosis not present

## 2023-12-10 DIAGNOSIS — R21 Rash and other nonspecific skin eruption: Secondary | ICD-10-CM | POA: Diagnosis not present

## 2023-12-10 DIAGNOSIS — E669 Obesity, unspecified: Secondary | ICD-10-CM | POA: Diagnosis not present

## 2023-12-10 DIAGNOSIS — Z79899 Other long term (current) drug therapy: Secondary | ICD-10-CM | POA: Diagnosis not present

## 2023-12-10 DIAGNOSIS — G0490 Encephalitis and encephalomyelitis, unspecified: Secondary | ICD-10-CM | POA: Diagnosis not present

## 2023-12-10 DIAGNOSIS — M1991 Primary osteoarthritis, unspecified site: Secondary | ICD-10-CM | POA: Diagnosis not present

## 2023-12-10 DIAGNOSIS — Z6835 Body mass index (BMI) 35.0-35.9, adult: Secondary | ICD-10-CM | POA: Diagnosis not present

## 2023-12-10 DIAGNOSIS — L932 Other local lupus erythematosus: Secondary | ICD-10-CM | POA: Diagnosis not present

## 2023-12-11 ENCOUNTER — Encounter: Payer: Self-pay | Admitting: Orthopaedic Surgery

## 2023-12-11 ENCOUNTER — Other Ambulatory Visit (INDEPENDENT_AMBULATORY_CARE_PROVIDER_SITE_OTHER): Payer: Self-pay

## 2023-12-11 ENCOUNTER — Ambulatory Visit: Admitting: Orthopaedic Surgery

## 2023-12-11 DIAGNOSIS — Z96652 Presence of left artificial knee joint: Secondary | ICD-10-CM

## 2023-12-11 NOTE — Progress Notes (Signed)
 Mrs. Arline comes in today in 91-month status post a left total knee arthroplasty.  We have replaced her right knee as well.  She has been to dermatology and they have figure out what is going on with the rash on her knees and is being treated appropriately now for that.  She is walk without assistive device.  She looks great and 83 years old.  She still having some IT band issues on the left side and says that she has not better since she was before surgery however she does agree after talking to her and that she is making progress.  Both knees were examined today and both knees have good range of motion and feel ligamentously stable.  An AP and lateral of the left knee show well-seated total knee arthroplasty with no complicating features.  I gave her reassurance that she is doing great.  From our standpoint we will see her back in 6 months.  Left final AP and lateral both knees at that visit.

## 2024-01-01 DIAGNOSIS — H353132 Nonexudative age-related macular degeneration, bilateral, intermediate dry stage: Secondary | ICD-10-CM | POA: Diagnosis not present

## 2024-01-01 DIAGNOSIS — H52203 Unspecified astigmatism, bilateral: Secondary | ICD-10-CM | POA: Diagnosis not present

## 2024-01-01 DIAGNOSIS — Z961 Presence of intraocular lens: Secondary | ICD-10-CM | POA: Diagnosis not present

## 2024-01-01 DIAGNOSIS — H26491 Other secondary cataract, right eye: Secondary | ICD-10-CM | POA: Diagnosis not present

## 2024-01-01 DIAGNOSIS — H2512 Age-related nuclear cataract, left eye: Secondary | ICD-10-CM | POA: Diagnosis not present

## 2024-01-09 DIAGNOSIS — H26491 Other secondary cataract, right eye: Secondary | ICD-10-CM | POA: Diagnosis not present

## 2024-01-09 DIAGNOSIS — L932 Other local lupus erythematosus: Secondary | ICD-10-CM | POA: Diagnosis not present

## 2024-01-15 ENCOUNTER — Ambulatory Visit: Admitting: Cardiology

## 2024-01-22 NOTE — Progress Notes (Deleted)
 HPI: FU PAF. 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 Monitor October 2018 showed paroxysmal atririllation and flutter. Nuclear study October 2018 showed ejection fraction 67%, apical thinning but no ischemia. Placed on apixaban .  Had implantable loop recorder placed by Dr. Kelsie for syncope and recurrent palpitations. She has been found to have heart pounding episodes not related to arrhythmia. Echo repeated 2/21 showed normal LV function, grade 1 DD, mild to moderate LAE. Coronary CTA 2/21 showed Ca score 36 and minimal (0-24%) in proximal LM, proximal and mid LAD and proximal Lcx.  Patient had atrial fibrillation ablation at [facility] May 2022.  Seen by Dr. Inocencio May 2024 with recurrent episodes of atrial fibrillation and had been scheduled for repeat ablation in October but she canceled this due to upcoming knee surgery.  Had knee replacement February 2025.  Since last seen   Current Outpatient Medications  Medication Sig Dispense Refill   Cholecalciferol  (VITAMIN D ) 50 MCG (2000 UT) CAPS Take 2,000-4,000 Units by mouth See admin instructions. Take 4000 units in the morning and 2000 units in the evening     clobetasol  cream (TEMOVATE ) 0.05 % Apply 1 Application topically daily as needed (itching).     ELIQUIS  5 MG TABS tablet TAKE ONE TABLET BY MOUTH TWICE DAILY 60 tablet 5   furosemide  (LASIX ) 40 MG tablet Take 2 tablets (80 mg total) by mouth daily. 180 tablet 3   gabapentin  (NEURONTIN ) 300 MG capsule Take 300 mg by mouth daily as needed (itching / pain).     hydroxychloroquine  (PLAQUENIL ) 200 MG tablet Take 200 mg by mouth 2 (two) times daily.     lipase/protease/amylase (CREON ) 36000 UNITS CPEP capsule Take 2 capsules with each meal. Take 1 capsule with each snack (up to 2 snacks) daily. 300 capsule 2   losartan  (COZAAR ) 50 MG tablet Take 1 tablet (50 mg total) by mouth daily. 90 tablet 3   methocarbamol  (ROBAXIN ) 500 MG tablet Take 1 tablet (500 mg total) by mouth  every 6 (six) hours as needed for muscle spasms. 40 tablet 0   Multiple Vitamin (MULTIVITAMIN WITH MINERALS) TABS tablet Take 1 tablet by mouth in the morning. One-A-Day for Women 50+     potassium chloride  (KLOR-CON ) 10 MEQ tablet Take 2 tablets (20 mEq total) by mouth 2 (two) times daily. 360 tablet 3   Propylene Glycol (SYSTANE BALANCE) 0.6 % SOLN Place 1 drop into both eyes at bedtime.     No current facility-administered medications for this visit.     Past Medical History:  Diagnosis Date   Atherosclerosis    mild cerebral   Atrial fibrillation and flutter (HCC)    Autoimmune disorder    Autoimmune encephalitis    Cervical disc disease    with spondylosis   Cervical spondylosis    Chronic dermatitis    eczematous   Colitis    COVID 10/2020   DDD (degenerative disc disease), lumbar    Frozen shoulder    left   Hyperlipidemia    Hypertension    IBS (irritable bowel syndrome)    with diarrhea Pt stated she never had this   Lateral epicondylitis    Lumbar spondylosis    Lupus    cutaneous   Movement disorder    Multiple lesions on CT of brain and spine    reports lesions from previous CT scan at [facility]    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   PONV (postoperative nausea and vomiting)  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   Torn meniscus    left    Past Surgical History:  Procedure Laterality Date   A FLUTTER ABLATION  08/2020   [facility] and had cardioversion at the same time   CATARACT EXTRACTION     COLONOSCOPY     EYE SURGERY Left    HEMORRHOID BANDING     KNEE ARTHROSCOPY Left 08/09/2020   Procedure: LEFT KNEE ARTHROSCOPY MEDIAL AND LATERAL MENISECTONMY AND CONDROPLASTY;  Surgeon: Sheril Coy, MD;  Location: [facility];  Service: Orthopedics;  Laterality: Left;   LOOP RECORDER INSERTION N/A 07/05/2017   Procedure: LOOP RECORDER INSERTION;  Surgeon: Kelsie Agent, MD;  Location: [facility];  Service: Cardiovascular;  Laterality: N/A;    TOTAL KNEE ARTHROPLASTY Right 04/24/2022   Procedure: RIGHT TOTAL KNEE ARTHROPLASTY;  Surgeon: Vernetta Lonni GRADE, MD;  Location: [facility];  Service: Orthopedics;  Laterality: Right;   TOTAL KNEE ARTHROPLASTY Left 06/21/2023   Procedure: LEFT TOTAL KNEE ARTHROPLASTY;  Surgeon: Vernetta Lonni GRADE, MD;  Location: [facility];  Service: Orthopedics;  Laterality: Left;   TUBAL LIGATION  1980   WISDOM TOOTH EXTRACTION      Social History   Socioeconomic History   Marital status: Married    Spouse name: Marcey   Number of children: 2   Years of education: MAx2   Highest education level: Not on file  Occupational History   Occupation: Retired    Associate Professor: [employer]  Tobacco Use   Smoking status: Never   Smokeless tobacco: Never  Vaping Use   Vaping status: Never Used  Substance and Sexual Activity   Alcohol  use: Yes    Alcohol /week: 4.0 standard drinks of alcohol     Types: 4 Glasses of wine per week    Comment: 7 glasses of wine weekly   Drug use: No   Sexual activity: Not Currently  Other Topics Concern   Not on file  Social History Narrative   Patient lives at home with her spouse.   Caffeine Use: 2-3 cups daily   Social Drivers of Health   Financial Resource Strain: Not on file  Food Insecurity: No Food Insecurity (06/21/2023)   Hunger Vital Sign    Worried About Running Out of Food in the Last Year: Never true    Ran Out of Food in the Last Year: Never true  Transportation Needs: No Transportation Needs (06/21/2023)   PRAPARE - Administrator, Civil Service (Medical): No    Lack of Transportation (Non-Medical): No  Physical Activity: Not on file  Stress: Not on file  Social Connections: Patient Declined (06/21/2023)   Social Connection and Isolation Panel    Frequency of Communication with Friends and Family: Patient declined    Frequency of Social Gatherings with Friends and Family: Patient declined    Attends Religious Services: Patient declined    Automotive engineer or Organizations: Patient declined    Attends Banker Meetings: Patient declined    Marital Status: Patient declined  Intimate Partner Violence: Not At Risk (06/21/2023)   Humiliation, Afraid, Rape, and Kick questionnaire    Fear of Current or Ex-Partner: No    Emotionally Abused: No    Physically Abused: No    Sexually Abused: No    Family History  Problem Relation Age of Onset   Cancer Mother 16       colon cancer   Stroke Mother    Colon cancer Mother 30  Heart attack Father 54   Emphysema Father    Cancer Maternal Grandmother 38       uterine   Thyroid  disease Maternal Grandmother    Thrombosis Maternal Grandfather    Thyroid  disease Son     ROS: no fevers or chills, productive cough, hemoptysis, dysphasia, odynophagia, melena, hematochezia, dysuria, hematuria, rash, seizure activity, orthopnea, PND, pedal edema, claudication. Remaining systems are negative.  Physical Exam: Well-developed well-nourished in no acute distress.  Skin is warm and dry.  HEENT is normal.  Neck is supple.  Chest is clear to auscultation with normal expansion.  Cardiovascular exam is regular rate and rhythm.  Abdominal exam nontender or distended. No masses palpated. Extremities show no edema. neuro grossly intact  ECG- personally reviewed  A/P  1 paroxysmal atrial fibrillation-patient remains in sinus rhythm.  Continue Toprol  and apixaban  at present dose.  2 coronary artery disease-minimal on previous CTA.  Patient has previously declined all lipid-lowering medications.  However aspirin given need for anticoagulation.  3 hypertension-patient's blood pressure is controlled.  Continue present medical regimen.  4 lower extremity edema-controlled.  Continue diuretic at present dose.  5 history of syncope-patient has had no recurrences since last office visit.  Redell Shallow, MD

## 2024-01-28 DIAGNOSIS — H25812 Combined forms of age-related cataract, left eye: Secondary | ICD-10-CM | POA: Diagnosis not present

## 2024-01-28 DIAGNOSIS — H2512 Age-related nuclear cataract, left eye: Secondary | ICD-10-CM | POA: Diagnosis not present

## 2024-01-30 ENCOUNTER — Encounter: Attending: Physical Medicine & Rehabilitation | Admitting: Physical Medicine & Rehabilitation

## 2024-01-30 ENCOUNTER — Encounter: Payer: Self-pay | Admitting: Physical Medicine & Rehabilitation

## 2024-01-30 VITALS — BP 135/83 | HR 77 | Ht 64.0 in | Wt 204.0 lb

## 2024-01-30 DIAGNOSIS — G248 Other dystonia: Secondary | ICD-10-CM | POA: Diagnosis not present

## 2024-01-30 MED ORDER — ONABOTULINUMTOXINA 100 UNITS IJ SOLR
100.0000 [IU] | Freq: Once | INTRAMUSCULAR | Status: AC
  Administered 2024-01-30: 100 [IU] via INTRAMUSCULAR

## 2024-01-30 MED ORDER — SODIUM CHLORIDE (PF) 0.9 % IJ SOLN
4.0000 mL | Freq: Once | INTRAMUSCULAR | Status: AC
  Administered 2024-01-30: 4 mL

## 2024-01-30 NOTE — Progress Notes (Signed)
Right lower extremity ultrasound injection under ultrasound guidance Indication focal dystonia right lower extremity  Botox dilution 50 units/cc  Linear ultrasound utilized to identify tibialis posterior between fibula and tibia Area marked and prepped with Betadine sterile ultrasound gel utilized  80 mm 22-gauge echo bloc needle was inserted under direct long axis ultrasound visualization.  Tibialis posterior identified, confirmed with electrical stimulation 50 units were injected in 2 sites for a total of 100U  Patient tolerated procedure well Post procedure instructions given

## 2024-02-05 ENCOUNTER — Ambulatory Visit: Admitting: Cardiology

## 2024-02-13 ENCOUNTER — Other Ambulatory Visit: Payer: Self-pay | Admitting: Cardiology

## 2024-02-13 DIAGNOSIS — I480 Paroxysmal atrial fibrillation: Secondary | ICD-10-CM

## 2024-02-13 NOTE — Telephone Encounter (Signed)
 Prescription refill request for Eliquis  received. Indication:afib Last office visit:3/25 Scr:0.48  4/25 Age: 83 Weight:92.5  kg  Prescription refilled

## 2024-02-21 ENCOUNTER — Telehealth: Payer: Self-pay | Admitting: Orthopaedic Surgery

## 2024-02-21 ENCOUNTER — Other Ambulatory Visit: Payer: Self-pay | Admitting: Orthopaedic Surgery

## 2024-02-21 MED ORDER — COLCHICINE 0.6 MG PO TABS: 0.6000 mg | ORAL_TABLET | Freq: Every day | ORAL | 0 refills | Status: DC | PRN

## 2024-02-21 NOTE — Telephone Encounter (Signed)
 Patient called and said the joint of the big toe on the R foot she thinks she has an infection. She can barely walk. CB#[phone]

## 2024-02-21 NOTE — Telephone Encounter (Signed)
 Patient is complaining of toe joint pain possibly gout again? States it feels very familiar to other times she had it in her feet before Can we call in colchicine ? [employer]

## 2024-02-24 ENCOUNTER — Encounter: Payer: Self-pay | Admitting: Radiology

## 2024-03-04 NOTE — Progress Notes (Signed)
 HPI: FU PAF. 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 Monitor October 2018 showed paroxysmal atrial fibrillation and flutter. Nuclear study October 2018 showed ejection fraction 67%, apical thinning but no ischemia. Placed on apixaban .  Had implantable loop recorder placed by Dr. Kelsie for syncope and recurrent palpitations. She has been found to have heart pounding episodes not related to arrhythmia. Echo repeated 2/21 showed normal LV function, grade 1 DD, mild to moderate LAE. Coronary CTA 2/21 showed Ca score 36 and minimal (0-24%) in proximal LM, proximal and mid LAD and proximal Lcx.  Patient had atrial fibrillation ablation at [facility] May 2022.  Seen by Dr. Inocencio May 2024 with recurrent episodes of atrial fibrillation and had been scheduled for repeat ablation in October but she canceled this due to upcoming knee surgery.  Had knee replacement February 2025.  Since last seen she denies dyspnea, chest pain or syncope.  She is not having recurrent atrial fibrillation.  No bleeding.  Current Outpatient Medications  Medication Sig Dispense Refill   Cholecalciferol  (VITAMIN D ) 50 MCG (2000 UT) CAPS Take 2,000-4,000 Units by mouth See admin instructions. Take 4000 units in the morning and 2000 units in the evening     clobetasol  cream (TEMOVATE ) 0.05 % Apply 1 Application topically daily as needed (itching).     ELIQUIS  5 MG TABS tablet TAKE ONE TABLET BY MOUTH TWICE DAILY 60 tablet 5   furosemide  (LASIX ) 40 MG tablet Take 2 tablets (80 mg total) by mouth daily. 180 tablet 3   hydroxychloroquine  (PLAQUENIL ) 200 MG tablet Take 200 mg by mouth 2 (two) times daily.     lipase/protease/amylase (CREON ) 36000 UNITS CPEP capsule Take 2 capsules with each meal. Take 1 capsule with each snack (up to 2 snacks) daily. 300 capsule 2   methocarbamol  (ROBAXIN ) 500 MG tablet Take 1 tablet (500 mg total) by mouth every 6 (six) hours as needed for muscle spasms. 40 tablet 0   Multiple  Vitamin (MULTIVITAMIN WITH MINERALS) TABS tablet Take 1 tablet by mouth in the morning. One-A-Day for Women 50+     potassium chloride  (KLOR-CON ) 10 MEQ tablet Take 2 tablets (20 mEq total) by mouth 2 (two) times daily. 360 tablet 3   Propylene Glycol (SYSTANE BALANCE) 0.6 % SOLN Place 1 drop into both eyes at bedtime.     losartan  (COZAAR ) 50 MG tablet Take 1 tablet (50 mg total) by mouth daily. (Patient not taking: Reported on 03/05/2024) 90 tablet 3   No current facility-administered medications for this visit.     Past Medical History:  Diagnosis Date   Atherosclerosis    mild cerebral   Atrial fibrillation and flutter (HCC)    Autoimmune disorder    Autoimmune encephalitis    Cervical disc disease    with spondylosis   Cervical spondylosis    Chronic dermatitis    eczematous   Colitis    COVID 10/2020   DDD (degenerative disc disease), lumbar    Frozen shoulder    left   Hyperlipidemia    Hypertension    IBS (irritable bowel syndrome)    with diarrhea Pt stated she never had this   Lateral epicondylitis    Lumbar spondylosis    Lupus    cutaneous   Movement disorder    Multiple lesions on CT of brain and spine    reports lesions from previous CT scan at [facility]    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   PONV (  postoperative nausea and vomiting)    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   Torn meniscus    left    Past Surgical History:  Procedure Laterality Date   A FLUTTER ABLATION  08/2020   [facility] and had cardioversion at the same time   CATARACT EXTRACTION     COLONOSCOPY     EYE SURGERY Left    HEMORRHOID BANDING     KNEE ARTHROSCOPY Left 08/09/2020   Procedure: LEFT KNEE ARTHROSCOPY MEDIAL AND LATERAL MENISECTONMY AND CONDROPLASTY;  Surgeon: Sheril Coy, MD;  Location: [facility];  Service: Orthopedics;  Laterality: Left;   LOOP RECORDER INSERTION N/A 07/05/2017   Procedure: LOOP RECORDER INSERTION;  Surgeon: Kelsie Agent, MD;  Location: [facility]  INV[facility];  Service: Cardiovascular;  Laterality: N/A;   TOTAL KNEE ARTHROPLASTY Right 04/24/2022   Procedure: RIGHT TOTAL KNEE ARTHROPLASTY;  Surgeon: Vernetta Lonni GRADE, MD;  Location: [facility];  Service: Orthopedics;  Laterality: Right;   TOTAL KNEE ARTHROPLASTY Left 06/21/2023   Procedure: LEFT TOTAL KNEE ARTHROPLASTY;  Surgeon: Vernetta Lonni GRADE, MD;  Location: [facility];  Service: Orthopedics;  Laterality: Left;   TUBAL LIGATION  1980   WISDOM TOOTH EXTRACTION      Social History   Socioeconomic History   Marital status: Married    Spouse name: Marcey   Number of children: 2   Years of education: MAx2   Highest education level: Not on file  Occupational History   Occupation: Retired    Associate Professor: [employer]  Tobacco Use   Smoking status: Never   Smokeless tobacco: Never  Vaping Use   Vaping status: Never Used  Substance and Sexual Activity   Alcohol  use: Yes    Alcohol /week: 4.0 standard drinks of alcohol     Types: 4 Glasses of wine per week    Comment: 7 glasses of wine weekly   Drug use: No   Sexual activity: Not Currently  Other Topics Concern   Not on file  Social History Narrative   Patient lives at home with her spouse.   Caffeine Use: 2-3 cups daily   Social Drivers of Health   Financial Resource Strain: Not on file  Food Insecurity: No Food Insecurity (06/21/2023)   Hunger Vital Sign    Worried About Running Out of Food in the Last Year: Never true    Ran Out of Food in the Last Year: Never true  Transportation Needs: No Transportation Needs (06/21/2023)   PRAPARE - Administrator, Civil Service (Medical): No    Lack of Transportation (Non-Medical): No  Physical Activity: Not on file  Stress: Not on file  Social Connections: Patient Declined (06/21/2023)   Social Connection and Isolation Panel    Frequency of Communication with Friends and Family: Patient declined    Frequency of Social Gatherings with Friends and Family: Patient declined     Attends Religious Services: Patient declined    Database Administrator or Organizations: Patient declined    Attends Banker Meetings: Patient declined    Marital Status: Patient declined  Intimate Partner Violence: Not At Risk (06/21/2023)   Humiliation, Afraid, Rape, and Kick questionnaire    Fear of Current or Ex-Partner: No    Emotionally Abused: No    Physically Abused: No    Sexually Abused: No    Family History  Problem Relation Age of Onset   Cancer Mother 26       colon cancer   Stroke Mother  Colon cancer Mother 35   Heart attack Father 95   Emphysema Father    Cancer Maternal Grandmother 90       uterine   Thyroid  disease Maternal Grandmother    Thrombosis Maternal Grandfather    Thyroid  disease Son     ROS: no fevers or chills, productive cough, hemoptysis, dysphasia, odynophagia, melena, hematochezia, dysuria, hematuria, rash, seizure activity, orthopnea, PND, pedal edema, claudication. Remaining systems are negative.  Physical Exam: Well-developed well-nourished in no acute distress.  Skin is warm and dry.  HEENT is normal.  Neck is supple.  Chest is clear to auscultation with normal expansion.  Cardiovascular exam is regular rate and rhythm.  Abdominal exam nontender or distended. No masses palpated. Extremities show no edema. neuro grossly intact  EKG Interpretation Date/Time:  Thursday March 05 2024 14:34:29 EST Ventricular Rate:  73 PR Interval:  168 QRS Duration:  92 QT Interval:  438 QTC Calculation: 482 R Axis:   -4  Text Interpretation: Normal sinus rhythm Normal ECG Confirmed by Pietro Rogue ([id number]) on 03/05/2024 2:38:55 PM    A/P  1 paroxysmal atrial fibrillation-patient remains in sinus rhythm.  Continue apixaban  at present dose.  She she has decided not to proceed with ablation at this point as her atrial fibrillation occurred following knee replacements and may have been postoperative rhythm disturbances.  If she  has more frequent episodes in the future we can consider ablation at that time.  2 coronary artery disease-minimal on previous CTA.  Patient has previously declined all lipid-lowering medications.  No aspirin given need for anticoagulation.  3 hypertension-patient's blood pressure is controlled.  Continue present medical regimen.  4 lower extremity edema-controlled.  Continue diuretic at present dose.  5 history of syncope-patient has had no recurrences since last office visit.  Rogue Pietro, MD

## 2024-03-05 ENCOUNTER — Ambulatory Visit: Attending: Cardiology | Admitting: Cardiology

## 2024-03-05 ENCOUNTER — Encounter: Payer: Self-pay | Admitting: Cardiology

## 2024-03-05 VITALS — BP 134/76 | HR 72 | Ht 64.0 in | Wt 207.0 lb

## 2024-03-05 DIAGNOSIS — E782 Mixed hyperlipidemia: Secondary | ICD-10-CM

## 2024-03-05 DIAGNOSIS — I480 Paroxysmal atrial fibrillation: Secondary | ICD-10-CM | POA: Diagnosis not present

## 2024-03-05 DIAGNOSIS — I2510 Atherosclerotic heart disease of native coronary artery without angina pectoris: Secondary | ICD-10-CM | POA: Diagnosis not present

## 2024-03-05 DIAGNOSIS — I10 Essential (primary) hypertension: Secondary | ICD-10-CM | POA: Diagnosis not present

## 2024-03-05 DIAGNOSIS — R609 Edema, unspecified: Secondary | ICD-10-CM

## 2024-03-05 NOTE — Patient Instructions (Signed)

## 2024-03-12 DIAGNOSIS — R809 Proteinuria, unspecified: Secondary | ICD-10-CM | POA: Diagnosis not present

## 2024-03-12 DIAGNOSIS — R946 Abnormal results of thyroid function studies: Secondary | ICD-10-CM | POA: Diagnosis not present

## 2024-03-12 DIAGNOSIS — R42 Dizziness and giddiness: Secondary | ICD-10-CM | POA: Diagnosis not present

## 2024-03-13 DIAGNOSIS — I10 Essential (primary) hypertension: Secondary | ICD-10-CM | POA: Diagnosis not present

## 2024-03-16 ENCOUNTER — Ambulatory Visit: Admitting: Cardiology

## 2024-03-16 DIAGNOSIS — L853 Xerosis cutis: Secondary | ICD-10-CM | POA: Diagnosis not present

## 2024-03-16 DIAGNOSIS — L930 Discoid lupus erythematosus: Secondary | ICD-10-CM | POA: Diagnosis not present

## 2024-03-16 DIAGNOSIS — D2271 Melanocytic nevi of right lower limb, including hip: Secondary | ICD-10-CM | POA: Diagnosis not present

## 2024-03-16 DIAGNOSIS — L814 Other melanin hyperpigmentation: Secondary | ICD-10-CM | POA: Diagnosis not present

## 2024-03-16 DIAGNOSIS — L821 Other seborrheic keratosis: Secondary | ICD-10-CM | POA: Diagnosis not present

## 2024-03-16 DIAGNOSIS — D2362 Other benign neoplasm of skin of left upper limb, including shoulder: Secondary | ICD-10-CM | POA: Diagnosis not present

## 2024-03-26 DIAGNOSIS — H18523 Epithelial (juvenile) corneal dystrophy, bilateral: Secondary | ICD-10-CM | POA: Diagnosis not present

## 2024-03-26 DIAGNOSIS — H353132 Nonexudative age-related macular degeneration, bilateral, intermediate dry stage: Secondary | ICD-10-CM | POA: Diagnosis not present

## 2024-03-26 DIAGNOSIS — Z79899 Other long term (current) drug therapy: Secondary | ICD-10-CM | POA: Diagnosis not present

## 2024-03-26 DIAGNOSIS — H35371 Puckering of macula, right eye: Secondary | ICD-10-CM | POA: Diagnosis not present

## 2024-03-26 DIAGNOSIS — H26491 Other secondary cataract, right eye: Secondary | ICD-10-CM | POA: Diagnosis not present

## 2024-03-26 DIAGNOSIS — Z961 Presence of intraocular lens: Secondary | ICD-10-CM | POA: Diagnosis not present

## 2024-03-26 DIAGNOSIS — H2512 Age-related nuclear cataract, left eye: Secondary | ICD-10-CM | POA: Diagnosis not present

## 2024-04-03 NOTE — Telephone Encounter (Signed)
 Return for Super K OS in [facility] at [facility].

## 2024-04-06 ENCOUNTER — Ambulatory Visit (HOSPITAL_BASED_OUTPATIENT_CLINIC_OR_DEPARTMENT_OTHER): Admitting: Student

## 2024-04-06 ENCOUNTER — Ambulatory Visit (HOSPITAL_BASED_OUTPATIENT_CLINIC_OR_DEPARTMENT_OTHER)

## 2024-04-06 DIAGNOSIS — M25571 Pain in right ankle and joints of right foot: Secondary | ICD-10-CM

## 2024-04-06 MED ORDER — COLCHICINE 0.6 MG PO TABS: 0.6000 mg | ORAL_TABLET | Freq: Every day | ORAL | 0 refills | Status: AC

## 2024-04-06 NOTE — Progress Notes (Signed)
 Chief Complaint: Left ankle pain    Discussed the use of AI scribe software for clinical note transcription with the patient, who gave verbal consent to proceed.  History of Present Illness Lindsay Clark is an 83 year old female with a history of gout who presents with left foot and ankle pain.  She had sudden onset severe pain in the left foot and ankle this morning, initially deep in the ankle and now localized to the heel. Pain is severe and makes her almost non-weight bearing, though she can place limited weight. She denies recent trauma or twisting injury and recalls a spontaneous popping sensation in the left ankle. She has gout with prior post-surgical flares that caused severe non-weight bearing pain and resolved within 24 hours with colchicine . She is unsure if this episode is gout. She had spontaneous fibular stress fractures without trauma and takes vitamin D  for bone health. She uses Aleve for pain but is cautious because she takes Eliquis . She has autoimmune encephalitis with residual right-sided numbness and atrial fibrillation treated with cardioversion and ablation.   Surgical History:   None of left foot or ankle  PMH/PSH/Family History/Social History/Meds/Allergies:    Past Medical History:  Diagnosis Date   Atherosclerosis    mild cerebral   Atrial fibrillation and flutter (HCC)    Autoimmune disorder    Autoimmune encephalitis    Cervical disc disease    with spondylosis   Cervical spondylosis    Chronic dermatitis    eczematous   Colitis    COVID 10/2020   DDD (degenerative disc disease), lumbar    Frozen shoulder    left   Hyperlipidemia    Hypertension    IBS (irritable bowel syndrome)    with diarrhea Pt stated she never had this   Lateral epicondylitis    Lumbar spondylosis    Lupus    cutaneous   Movement disorder    Multiple lesions on CT of brain and spine    reports lesions from previous CT scan at [facility]     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   PONV (postoperative nausea and vomiting)    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   Torn meniscus    left   Past Surgical History:  Procedure Laterality Date   A FLUTTER ABLATION  08/2020   [facility] and had cardioversion at the same time   CATARACT EXTRACTION     COLONOSCOPY     EYE SURGERY Left    HEMORRHOID BANDING     KNEE ARTHROSCOPY Left 08/09/2020   Procedure: LEFT KNEE ARTHROSCOPY MEDIAL AND LATERAL MENISECTONMY AND CONDROPLASTY;  Surgeon: Sheril Coy, MD;  Location: [facility];  Service: Orthopedics;  Laterality: Left;   LOOP RECORDER INSERTION N/A 07/05/2017   Procedure: LOOP RECORDER INSERTION;  Surgeon: Kelsie Agent, MD;  Location: [facility];  Service: Cardiovascular;  Laterality: N/A;   TOTAL KNEE ARTHROPLASTY Right 04/24/2022   Procedure: RIGHT TOTAL KNEE ARTHROPLASTY;  Surgeon: Vernetta Lonni GRADE, MD;  Location: [facility];  Service: Orthopedics;  Laterality: Right;   TOTAL KNEE ARTHROPLASTY Left 06/21/2023   Procedure: LEFT TOTAL KNEE ARTHROPLASTY;  Surgeon: Vernetta Lonni GRADE, MD;  Location: [facility];  Service: Orthopedics;  Laterality: Left;   TUBAL LIGATION  1980   WISDOM TOOTH EXTRACTION  Social History   Socioeconomic History   Marital status: Married    Spouse name: Marcey   Number of children: 2   Years of education: MAx2   Highest education level: Not on file  Occupational History   Occupation: Retired    Associate Professor: [employer]  Tobacco Use   Smoking status: Never   Smokeless tobacco: Never  Vaping Use   Vaping status: Never Used  Substance and Sexual Activity   Alcohol  use: Yes    Alcohol /week: 4.0 standard drinks of alcohol     Types: 4 Glasses of wine per week    Comment: 7 glasses of wine weekly   Drug use: No   Sexual activity: Not Currently  Other Topics Concern   Not on file  Social History Narrative   Patient lives at home with her spouse.   Caffeine Use: 2-3 cups daily   Social  Drivers of Health   Tobacco Use: Low Risk (04/03/2024)   Received from [facility]   Patient History    Smoking Tobacco Use: Never    Smokeless Tobacco Use: Never    Passive Exposure: Not on file  Financial Resource Strain: Not on file  Food Insecurity: No Food Insecurity (06/21/2023)   Hunger Vital Sign    Worried About Running Out of Food in the Last Year: Never true    Ran Out of Food in the Last Year: Never true  Transportation Needs: No Transportation Needs (06/21/2023)   PRAPARE - Administrator, Civil Service (Medical): No    Lack of Transportation (Non-Medical): No  Physical Activity: Not on file  Stress: Not on file  Social Connections: Patient Declined (06/21/2023)   Social Connection and Isolation Panel    Frequency of Communication with Friends and Family: Patient declined    Frequency of Social Gatherings with Friends and Family: Patient declined    Attends Religious Services: Patient declined    Active Member of Clubs or Organizations: Patient declined    Attends Banker Meetings: Patient declined    Marital Status: Patient declined  Depression (PHQ2-9): Low Risk (10/10/2023)   Depression (PHQ2-9)    PHQ-2 Score: 0  Alcohol  Screen: Not on file  Housing: Low Risk (06/21/2023)   Housing Stability Vital Sign    Unable to Pay for Housing in the Last Year: No    Number of Times Moved in the Last Year: 0    Homeless in the Last Year: No  Utilities: Not At Risk (06/21/2023)   AHC Utilities    Threatened with loss of utilities: No  Health Literacy: Not on file   Family History  Problem Relation Age of Onset   Cancer Mother 77       colon cancer   Stroke Mother    Colon cancer Mother 57   Heart attack Father 27   Emphysema Father    Cancer Maternal Grandmother 47       uterine   Thyroid  disease Maternal Grandmother    Thrombosis Maternal Grandfather    Thyroid  disease Son    Allergies[1] Current Outpatient Medications  Medication Sig  Dispense Refill   colchicine  0.6 MG tablet Take 1 tablet (0.6 mg total) by mouth daily. 14 tablet 0   Cholecalciferol  (VITAMIN D ) 50 MCG (2000 UT) CAPS Take 2,000-4,000 Units by mouth See admin instructions. Take 4000 units in the morning and 2000 units in the evening     clobetasol  cream (TEMOVATE ) 0.05 % Apply 1 Application topically daily as needed (  itching).     ELIQUIS  5 MG TABS tablet TAKE ONE TABLET BY MOUTH TWICE DAILY 60 tablet 5   furosemide  (LASIX ) 40 MG tablet Take 2 tablets (80 mg total) by mouth daily. 180 tablet 3   hydroxychloroquine  (PLAQUENIL ) 200 MG tablet Take 200 mg by mouth 2 (two) times daily.     lipase/protease/amylase (CREON ) 36000 UNITS CPEP capsule Take 2 capsules with each meal. Take 1 capsule with each snack (up to 2 snacks) daily. 300 capsule 2   losartan  (COZAAR ) 50 MG tablet Take 1 tablet (50 mg total) by mouth daily. (Patient not taking: Reported on 03/05/2024) 90 tablet 3   methocarbamol  (ROBAXIN ) 500 MG tablet Take 1 tablet (500 mg total) by mouth every 6 (six) hours as needed for muscle spasms. 40 tablet 0   Multiple Vitamin (MULTIVITAMIN WITH MINERALS) TABS tablet Take 1 tablet by mouth in the morning. One-A-Day for Women 50+     potassium chloride  (KLOR-CON ) 10 MEQ tablet Take 2 tablets (20 mEq total) by mouth 2 (two) times daily. 360 tablet 3   Propylene Glycol (SYSTANE BALANCE) 0.6 % SOLN Place 1 drop into both eyes at bedtime.     No current facility-administered medications for this visit.   No results found.  Review of Systems:   A ROS was performed including pertinent positives and negatives as documented in the HPI.  Physical Exam :   Constitutional: NAD and appears stated age Neurological: Alert and oriented Psych: Appropriate affect and cooperative There were no vitals taken for this visit.   Comprehensive Musculoskeletal Exam:    Exam of the right ankle demonstrates no visible palpable deformity.  The foot and ankle are diffusely nontender  including the medial and lateral malleolus, lateral ankle ligaments, calcaneus, and Achilles.  Negative squeeze test and anterior drawer.  Mild diffuse soft tissue edema present.  DP pulse 2+.  Imaging:   Xray (right ankle 3 views): Negative for acute fracture or dislocation.  Prior healed fracture involving the distal fibular shaft.  Mild degenerative changes of the ankle mortise.   I personally reviewed and interpreted the radiographs.      Assessment & Plan Right ankle and foot pain Atraumatic right foot and ankle pain that began upon waking up this morning and is generally worse with weightbearing making ambulation difficult.  Pain initially presented more in the ankle but has transitioned now toward the heel.  X-rays show no acute abnormalities advanced degenerative changes.  Patient does have history of stress fractures involving both lower extremities in the past.  She does also state that a similar episode occurred a few years ago in her left ankle that was suspected to be gout and resolved quickly with colchicine .  I do not feel like gout is highly likely based on her exam, however have agreed to begin a short course of colchicine  to see if this helps improve her symptoms.  She is unable to take other NSAIDs due to Eliquis .  If symptoms continue to persist or worsen, would likely want to repeat x-rays in another 2 weeks to further evaluate for an occult stress fracture.  Patient would like to hold off in the meantime on any type of postop shoe or boot due to her not tolerating as well in the past.       I personally saw and evaluated the patient, and participated in the management and treatment plan.  Leonce Reveal, PA-C Orthopedics     [1]  Allergies Allergen Reactions  Amlodipine Swelling     leg swelling    Diltiazem  Swelling    Bilateral feet   Methotrexate Swelling     swelling in ankles    Metronidazole  Swelling     leg swelling   Other Other (See Comments)     Medication:Methychloroisothlazolinone/methylisothiazilinoneCL +Me-Isothiazolinone Reaction: unknown-patch testing  Medication:glyceryl thioglycolate  Reaction:unknown-patch testing   Quaternium-15 Other (See Comments)    unknown reaction-patching testing

## 2024-04-10 ENCOUNTER — Ambulatory Visit (HOSPITAL_BASED_OUTPATIENT_CLINIC_OR_DEPARTMENT_OTHER): Admitting: Student

## 2024-04-24 ENCOUNTER — Ambulatory Visit: Admitting: Cardiology

## 2024-05-01 ENCOUNTER — Encounter: Payer: Self-pay | Admitting: Physical Medicine & Rehabilitation

## 2024-05-01 ENCOUNTER — Encounter: Attending: Physical Medicine & Rehabilitation | Admitting: Physical Medicine & Rehabilitation

## 2024-05-01 VITALS — BP 138/80 | HR 89 | Ht 64.0 in | Wt 205.4 lb

## 2024-05-01 DIAGNOSIS — G248 Other dystonia: Secondary | ICD-10-CM | POA: Insufficient documentation

## 2024-05-01 DIAGNOSIS — M21371 Foot drop, right foot: Secondary | ICD-10-CM | POA: Insufficient documentation

## 2024-05-01 MED ORDER — ONABOTULINUMTOXINA 100 UNITS IJ SOLR
100.0000 [IU] | Freq: Once | INTRAMUSCULAR | Status: AC
  Administered 2024-05-01: 100 [IU] via INTRAMUSCULAR

## 2024-05-01 MED ORDER — SODIUM CHLORIDE (PF) 0.9 % IJ SOLN
2.0000 mL | Freq: Once | INTRAMUSCULAR | Status: AC
  Administered 2024-05-01: 2 mL

## 2024-05-01 NOTE — Progress Notes (Signed)
 Right lower extremity ultrasound injection under ultrasound guidance Indication focal dystonia right lower extremity  Botox  dilution 50 units/cc  Linear ultrasound utilized to identify tibialis posterior between fibula and tibia, muscle layer deep to the gastrocnemius. Area marked and prepped with Betadine  sterile ultrasound gel utilized  80 mm 22-gauge echo bloc needle was inserted under direct long axis ultrasound visualization.  Tibialis posterior identified,50 units were injected in 2 sites for a total of 100U  Patient tolerated procedure well Post procedure instructions given  Patient wished to discuss a fall where she hit her left eye and going up into her.  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  We discussed that botulinum toxin for the last 2-1/2 months but we can only inject it every 3 months.  We also discussed the potential for orthotic management.  The patient had a brace from her orthopedic surgeon which is now about 82 years old.  She did not feel it was helpful for her.  We discussed dropping it off at the office so I can take a look at it and then advise whether a different type of brace might be more effective.  We also discussed that after getting a brace training with physical therapy might be helpful.    Patient dropped off her brace on 05/07/2024.  It is a carbon fiber AFO.  It does appear appropriate for her condition although there are more supportive type braces that may provide additional control of such as semisolid AFO.  Will make referral to [facility]

## 2024-05-06 ENCOUNTER — Telehealth: Payer: Self-pay | Admitting: Orthopaedic Surgery

## 2024-05-06 NOTE — Telephone Encounter (Signed)
 Patient called stating she is in severe pain with her right heel/ankle. States she has history of gout, but feels like this is different. She would like a call back [phone]

## 2024-05-07 NOTE — Addendum Note (Signed)
 Addended by: CARILYN HECK E on: 05/07/2024 02:09 PM   Modules accepted: Orders

## 2024-05-07 NOTE — Telephone Encounter (Signed)
 Called pt and she stated she had already taken care of the issue.

## 2024-05-08 ENCOUNTER — Encounter: Payer: Self-pay | Admitting: *Deleted

## 2024-06-10 ENCOUNTER — Ambulatory Visit: Admitting: Orthopaedic Surgery

## 2024-07-31 ENCOUNTER — Encounter: Admitting: Physical Medicine & Rehabilitation
